# Patient Record
Sex: Male | Born: 1935
Health system: Southern US, Community
[De-identification: ages and names within clinical notes are randomized; demographics above are authoritative.]

## PROBLEM LIST (undated history)

## (undated) DIAGNOSIS — K5900 Constipation, unspecified: Secondary | ICD-10-CM

## (undated) DIAGNOSIS — G4730 Sleep apnea, unspecified: Secondary | ICD-10-CM

## (undated) DIAGNOSIS — R350 Frequency of micturition: Secondary | ICD-10-CM

## (undated) DIAGNOSIS — R42 Dizziness and giddiness: Secondary | ICD-10-CM

## (undated) DIAGNOSIS — H9190 Unspecified hearing loss, unspecified ear: Secondary | ICD-10-CM

## (undated) DIAGNOSIS — M1990 Unspecified osteoarthritis, unspecified site: Secondary | ICD-10-CM

## (undated) DIAGNOSIS — M1610 Unilateral primary osteoarthritis, unspecified hip: Secondary | ICD-10-CM

## (undated) DIAGNOSIS — E669 Obesity, unspecified: Secondary | ICD-10-CM

## (undated) DIAGNOSIS — R609 Edema, unspecified: Secondary | ICD-10-CM

## (undated) DIAGNOSIS — R634 Abnormal weight loss: Secondary | ICD-10-CM

## (undated) DIAGNOSIS — R269 Unspecified abnormalities of gait and mobility: Secondary | ICD-10-CM

## (undated) DIAGNOSIS — M5090 Cervical disc disorder, unspecified, unspecified cervical region: Secondary | ICD-10-CM

## (undated) DIAGNOSIS — D649 Anemia, unspecified: Secondary | ICD-10-CM

## (undated) DIAGNOSIS — Z973 Presence of spectacles and contact lenses: Secondary | ICD-10-CM

## (undated) DIAGNOSIS — T4145XA Adverse effect of unspecified anesthetic, initial encounter: Secondary | ICD-10-CM

## (undated) DIAGNOSIS — S0990XA Unspecified injury of head, initial encounter: Secondary | ICD-10-CM

## (undated) DIAGNOSIS — E7800 Pure hypercholesterolemia, unspecified: Secondary | ICD-10-CM

## (undated) DIAGNOSIS — M48061 Spinal stenosis, lumbar region without neurogenic claudication: Secondary | ICD-10-CM

## (undated) DIAGNOSIS — R413 Other amnesia: Secondary | ICD-10-CM

## (undated) DIAGNOSIS — I8290 Acute embolism and thrombosis of unspecified vein: Secondary | ICD-10-CM

## (undated) DIAGNOSIS — Z7901 Long term (current) use of anticoagulants: Secondary | ICD-10-CM

## (undated) DIAGNOSIS — I10 Essential (primary) hypertension: Secondary | ICD-10-CM

## (undated) DIAGNOSIS — Z9181 History of falling: Secondary | ICD-10-CM

## (undated) DIAGNOSIS — T8859XA Other complications of anesthesia, initial encounter: Secondary | ICD-10-CM

## (undated) DIAGNOSIS — R300 Dysuria: Secondary | ICD-10-CM

## (undated) DIAGNOSIS — G629 Polyneuropathy, unspecified: Secondary | ICD-10-CM

## (undated) DIAGNOSIS — F419 Anxiety disorder, unspecified: Secondary | ICD-10-CM

## (undated) DIAGNOSIS — R936 Abnormal findings on diagnostic imaging of limbs: Secondary | ICD-10-CM

## (undated) DIAGNOSIS — M1611 Unilateral primary osteoarthritis, right hip: Secondary | ICD-10-CM

## (undated) HISTORY — DX: Unspecified injury of head, initial encounter: S09.90XA

## (undated) HISTORY — DX: Spinal stenosis, lumbar region without neurogenic claudication: M48.061

## (undated) HISTORY — DX: Acute embolism and thrombosis of unspecified vein: I82.90

## (undated) HISTORY — DX: Abnormal weight loss: R63.4

## (undated) HISTORY — PX: COLONOSCOPY: SHX174

## (undated) HISTORY — PX: BACK SURGERY: SHX140

## (undated) HISTORY — DX: Polyneuropathy, unspecified: G62.9

## (undated) HISTORY — DX: Obesity, unspecified: E66.9

## (undated) HISTORY — DX: Other amnesia: R41.3

## (undated) HISTORY — DX: Dysuria: R30.0

## (undated) HISTORY — DX: History of falling: Z91.81

## (undated) HISTORY — DX: Anemia, unspecified: D64.9

## (undated) HISTORY — DX: Unilateral primary osteoarthritis, right hip: M16.11

## (undated) HISTORY — DX: Unspecified abnormalities of gait and mobility: R26.9

## (undated) HISTORY — DX: Constipation, unspecified: K59.00

## (undated) HISTORY — DX: Long term (current) use of anticoagulants: Z79.01

## (undated) HISTORY — DX: Cervical disc disorder, unspecified, unspecified cervical region: M50.90

## (undated) HISTORY — DX: Abnormal findings on diagnostic imaging of limbs: R93.6

## (undated) HISTORY — DX: Edema, unspecified: R60.9

## (undated) HISTORY — PX: ESOPHAGOGASTRODUODENOSCOPY: SHX1529

## (undated) HISTORY — DX: Unilateral primary osteoarthritis, unspecified hip: M16.10

---

## 2002-06-20 ENCOUNTER — Emergency Department (HOSPITAL_COMMUNITY): Admission: EM | Admit: 2002-06-20 | Discharge: 2002-06-20 | Payer: Self-pay | Admitting: Emergency Medicine

## 2002-06-20 ENCOUNTER — Encounter: Payer: Self-pay | Admitting: Emergency Medicine

## 2004-06-18 ENCOUNTER — Encounter: Admission: RE | Admit: 2004-06-18 | Discharge: 2004-07-23 | Payer: Self-pay | Admitting: Family Medicine

## 2011-08-14 ENCOUNTER — Other Ambulatory Visit (HOSPITAL_COMMUNITY): Payer: Self-pay | Admitting: Orthopedic Surgery

## 2011-08-14 DIAGNOSIS — R531 Weakness: Secondary | ICD-10-CM

## 2011-08-14 DIAGNOSIS — M25562 Pain in left knee: Secondary | ICD-10-CM

## 2011-08-15 ENCOUNTER — Inpatient Hospital Stay (HOSPITAL_COMMUNITY)
Admission: RE | Admit: 2011-08-15 | Discharge: 2011-08-15 | Payer: Self-pay | Source: Ambulatory Visit | Attending: Orthopedic Surgery | Admitting: Orthopedic Surgery

## 2011-08-17 ENCOUNTER — Ambulatory Visit (HOSPITAL_COMMUNITY)
Admission: RE | Admit: 2011-08-17 | Discharge: 2011-08-17 | Disposition: A | Discharge status: Home / Self Care | Payer: Medicare Other | Source: Ambulatory Visit | Attending: Orthopedic Surgery | Admitting: Orthopedic Surgery

## 2011-08-17 DIAGNOSIS — M6740 Ganglion, unspecified site: Secondary | ICD-10-CM | POA: Insufficient documentation

## 2011-08-17 DIAGNOSIS — M25562 Pain in left knee: Secondary | ICD-10-CM

## 2011-08-17 DIAGNOSIS — M25569 Pain in unspecified knee: Secondary | ICD-10-CM | POA: Insufficient documentation

## 2011-08-17 DIAGNOSIS — X58XXXA Exposure to other specified factors, initial encounter: Secondary | ICD-10-CM | POA: Insufficient documentation

## 2011-08-17 DIAGNOSIS — S82209A Unspecified fracture of shaft of unspecified tibia, initial encounter for closed fracture: Secondary | ICD-10-CM | POA: Insufficient documentation

## 2011-08-17 DIAGNOSIS — R531 Weakness: Secondary | ICD-10-CM

## 2011-08-17 DIAGNOSIS — S83289A Other tear of lateral meniscus, current injury, unspecified knee, initial encounter: Secondary | ICD-10-CM | POA: Insufficient documentation

## 2012-08-06 ENCOUNTER — Emergency Department (HOSPITAL_COMMUNITY): Payer: Medicare Other

## 2012-08-06 ENCOUNTER — Emergency Department (HOSPITAL_COMMUNITY)
Admission: EM | Admit: 2012-08-06 | Discharge: 2012-08-06 | Disposition: A | Discharge status: Home / Self Care | Payer: Medicare Other | Attending: Emergency Medicine | Admitting: Emergency Medicine

## 2012-08-06 ENCOUNTER — Encounter (HOSPITAL_COMMUNITY): Payer: Self-pay | Admitting: Emergency Medicine

## 2012-08-06 DIAGNOSIS — M79609 Pain in unspecified limb: Secondary | ICD-10-CM | POA: Insufficient documentation

## 2012-08-06 DIAGNOSIS — I10 Essential (primary) hypertension: Secondary | ICD-10-CM | POA: Insufficient documentation

## 2012-08-06 DIAGNOSIS — Z8739 Personal history of other diseases of the musculoskeletal system and connective tissue: Secondary | ICD-10-CM | POA: Insufficient documentation

## 2012-08-06 DIAGNOSIS — M25569 Pain in unspecified knee: Secondary | ICD-10-CM

## 2012-08-06 DIAGNOSIS — E7800 Pure hypercholesterolemia, unspecified: Secondary | ICD-10-CM | POA: Insufficient documentation

## 2012-08-06 HISTORY — DX: Unspecified osteoarthritis, unspecified site: M19.90

## 2012-08-06 HISTORY — DX: Pure hypercholesterolemia, unspecified: E78.00

## 2012-08-06 HISTORY — DX: Essential (primary) hypertension: I10

## 2012-08-06 MED ORDER — HYDROMORPHONE HCL PF 1 MG/ML IJ SOLN
1.0000 mg | Freq: Once | INTRAMUSCULAR | Status: AC
  Administered 2012-08-06: 1 mg via INTRAMUSCULAR
  Filled 2012-08-06: qty 1

## 2012-08-06 MED ORDER — OXYCODONE-ACETAMINOPHEN 5-325 MG PO TABS: 1.0000 | ORAL_TABLET | Freq: Once | ORAL | Status: DC

## 2012-08-06 MED ORDER — PROMETHAZINE HCL 25 MG/ML IJ SOLN
12.5000 mg | Freq: Once | INTRAMUSCULAR | Status: AC
  Administered 2012-08-06: 12.5 mg via INTRAMUSCULAR
  Filled 2012-08-06: qty 1

## 2012-08-06 MED ORDER — ONDANSETRON 4 MG PO TBDP
8.0000 mg | ORAL_TABLET | Freq: Once | ORAL | Status: AC
  Administered 2012-08-06: 8 mg via ORAL

## 2012-08-06 MED ORDER — SODIUM CHLORIDE 0.9 % IV BOLUS (SEPSIS)
500.0000 mL | Freq: Once | INTRAVENOUS | Status: AC
  Administered 2012-08-06: 500 mL via INTRAVENOUS

## 2012-08-06 MED ORDER — PROMETHAZINE HCL 25 MG PO TABS: 25.0000 mg | ORAL_TABLET | Freq: Four times a day (QID) | ORAL | Status: DC | PRN

## 2012-08-06 MED ORDER — ONDANSETRON 4 MG PO TBDP
ORAL_TABLET | ORAL | Status: AC
  Administered 2012-08-06: 8 mg via ORAL
  Filled 2012-08-06: qty 2

## 2012-08-06 MED ORDER — HYDROCODONE-ACETAMINOPHEN 5-325 MG PO TABS: 1.0000 | ORAL_TABLET | Freq: Four times a day (QID) | ORAL | Status: DC | PRN

## 2012-08-06 MED ORDER — MECLIZINE HCL 25 MG PO TABS: ORAL_TABLET | ORAL | Status: DC

## 2012-08-06 MED ORDER — MECLIZINE HCL 25 MG PO TABS
25.0000 mg | ORAL_TABLET | Freq: Once | ORAL | Status: AC
  Administered 2012-08-06: 25 mg via ORAL
  Filled 2012-08-06: qty 1

## 2012-08-06 MED ORDER — ONDANSETRON 4 MG PO TBDP
8.0000 mg | ORAL_TABLET | Freq: Once | ORAL | Status: AC
  Administered 2012-08-06: 8 mg via ORAL
  Filled 2012-08-06: qty 2

## 2012-08-06 NOTE — ED Notes (Signed)
Pt vomited X 1 in radiology. RN went to CT to check on pt, pt reports he gets motion sickness and when they moved him over to the table he started to feel more nauseous then vomited. Dr. Estell Harpin made aware.

## 2012-08-06 NOTE — ED Provider Notes (Signed)
History   This chart was scribed for Travis Lennert, MD by Sofie Rower, ED Scribe. The patient was seen in room TR08C/TR08C and the patient's care was started at 3:26PM.    CSN: [id number]  Arrival date & time 08/06/12  1454   First MD Initiated Contact with Patient 08/06/12 1526      Chief Complaint  Patient presents with  . Leg Pain  . Knee Pain    (Consider location/radiation/quality/duration/timing/severity/associated sxs/prior treatment) Patient is a 78 y.o. male presenting with leg pain and knee pain. The history is provided by the patient. No language interpreter was used.  Leg Pain  The incident occurred 2 days ago. There was no injury mechanism. The pain is present in the right leg, right knee and right hip. The pain is moderate. The pain has been constant since onset. He reports no foreign bodies present. The symptoms are aggravated by activity. He has tried nothing for the symptoms. The treatment provided no relief.  Knee Pain This is a new problem. The current episode started 2 days ago. The problem occurs constantly. The problem has been gradually worsening. Pertinent negatives include no chest pain, no abdominal pain and no headaches. The symptoms are aggravated by walking and bending. Nothing relieves the symptoms. He has tried nothing for the symptoms. The treatment provided no relief.    Past Medical History  Diagnosis Date  . Arthritis   . Hypertension   . Hypercholesteremia     History reviewed. No pertinent past surgical history.  History reviewed. No pertinent family history.  History  Substance Use Topics  . Smoking status: Never Smoker   . Smokeless tobacco: Not on file  . Alcohol Use: Yes     Comment: occ      Review of Systems  Constitutional: Negative for fatigue.  HENT: Negative for congestion, sinus pressure and ear discharge.   Eyes: Negative for discharge.  Respiratory: Negative for cough.   Cardiovascular: Negative for chest pain.    Gastrointestinal: Negative for abdominal pain and diarrhea.  Genitourinary: Negative for frequency and hematuria.  Musculoskeletal: Negative for back pain.  Skin: Negative for rash.  Neurological: Negative for seizures and headaches.  Hematological: Negative.   Psychiatric/Behavioral: Negative for hallucinations.    Allergies  Review of patient's allergies indicates no known allergies.  Home Medications  No current outpatient prescriptions on file.  BP 125/81  Pulse 66  Temp 97.6 F (36.4 C) (Oral)  Resp 18  SpO2 100%  Physical Exam  Nursing note and vitals reviewed. Constitutional: He is oriented to person, place, and time. He appears well-developed.  HENT:  Head: Normocephalic.  Eyes: Conjunctivae normal are normal.  Neck: No tracheal deviation present.  Cardiovascular:  No murmur heard. Musculoskeletal: Normal range of motion. He exhibits tenderness.       Right hip: He exhibits tenderness.       Right knee: tenderness found.       Positive straight leg tenderness in the right knee, worse with flexion. Tenderness within the right leg. Tenderness within the right hip.    Neurological: He is oriented to person, place, and time.  Skin: Skin is warm.  Psychiatric: He has a normal mood and affect.    ED Course  Procedures (including critical care time)  DIAGNOSTIC STUDIES: Oxygen Saturation is 100% on room air, normal by my interpretation.    COORDINATION OF CARE:   3:30 PM- Treatment plan concerning pain management and x-rays discussed with patient. Pt agrees with  treatment.   5:21 PM- Recheck. Treatment plan discussed with patient. Pt agrees with treatment.      Labs Reviewed - No data to display  No results found for this or any previous visit. Dg Hip Complete Right  08/06/2012  *RADIOLOGY REPORT*  Clinical Data: Right hip pain.  RIGHT HIP - COMPLETE 2+ VIEW  Comparison: None  Findings: Mild degenerative changes present.  No evidence of acute fracture  or dislocation.  The soft tissues are unremarkable.  The bony pelvis is intact.  IMPRESSION: No acute fracture.  Mild degenerative changes of the right hip.   Original Report Authenticated By: Irish Lack, M.D.    Dg Knee Complete 4 Views Right  08/06/2012  *RADIOLOGY REPORT*  Clinical Data: Right knee pain.  Difficulty walking.  No known injuries.  RIGHT KNEE - COMPLETE 4+ VIEW  Comparison: None.  Findings: No evidence of acute or subacute fracture or dislocation. Well-preserved joint spaces.  Hypertrophic spurring involving the patellofemoral and lateral compartments.  Enthesopathic spurring at the insertion of the quadriceps tendon on the superior patella and the insertion of the patellar tendon on both the inferior patella and the tibial tubercle.  No evidence of a significant joint effusion.  Well-preserved bone mineral density.  IMPRESSION: No acute or subacute osseous abnormality.  Degenerative changes as described.   Original Report Authenticated By: Hulan Saas, M.D.       No diagnosis found.    MDM      The chart was scribed for me under my direct supervision.  I personally performed the history, physical, and medical decision making and all procedures in the evaluation of this patient.Travis Lennert, MD 08/06/12 1723

## 2012-08-06 NOTE — ED Notes (Signed)
Pt. Verbalized understanding of meclizine.

## 2012-08-06 NOTE — ED Notes (Signed)
Pt being taken to d/c in wheelchair when he became nauseated and vomited X 1. Pt brought back to room. MD notified.

## 2012-08-06 NOTE — ED Notes (Signed)
Pt. States "I feel like I can leave".

## 2012-08-06 NOTE — ED Notes (Signed)
Pt c/o right leg pain.Pt unable to give specific spot it starts or hurts worse. Pt reports it started 3 days.

## 2012-08-06 NOTE — ED Notes (Signed)
Pt c/o right knee and hip pain x 2 days; pt denies fall or injury

## 2012-08-06 NOTE — ED Notes (Signed)
Pt in CT, CT tech called reports pt is nauseous. MD aware, ordered zofran PO.

## 2012-08-06 NOTE — ED Notes (Signed)
Pt reports he feels "swimmy headed"

## 2013-06-20 ENCOUNTER — Ambulatory Visit: Payer: BC Managed Care – PPO | Admitting: Family Medicine

## 2013-06-28 ENCOUNTER — Ambulatory Visit (INDEPENDENT_AMBULATORY_CARE_PROVIDER_SITE_OTHER): Payer: Self-pay | Admitting: Family Medicine

## 2013-06-28 DIAGNOSIS — Z713 Dietary counseling and surveillance: Secondary | ICD-10-CM

## 2013-10-31 ENCOUNTER — Emergency Department (HOSPITAL_COMMUNITY)
Admission: EM | Admit: 2013-10-31 | Discharge: 2013-10-31 | Disposition: A | Discharge status: Home / Self Care | Payer: PRIVATE HEALTH INSURANCE | Attending: Emergency Medicine | Admitting: Emergency Medicine

## 2013-10-31 ENCOUNTER — Emergency Department (HOSPITAL_COMMUNITY): Payer: PRIVATE HEALTH INSURANCE

## 2013-10-31 ENCOUNTER — Encounter (HOSPITAL_COMMUNITY): Payer: Self-pay | Admitting: Emergency Medicine

## 2013-10-31 DIAGNOSIS — Z791 Long term (current) use of non-steroidal anti-inflammatories (NSAID): Secondary | ICD-10-CM | POA: Insufficient documentation

## 2013-10-31 DIAGNOSIS — R1110 Vomiting, unspecified: Secondary | ICD-10-CM

## 2013-10-31 DIAGNOSIS — M129 Arthropathy, unspecified: Secondary | ICD-10-CM | POA: Insufficient documentation

## 2013-10-31 DIAGNOSIS — E7800 Pure hypercholesterolemia, unspecified: Secondary | ICD-10-CM | POA: Insufficient documentation

## 2013-10-31 DIAGNOSIS — R112 Nausea with vomiting, unspecified: Secondary | ICD-10-CM | POA: Insufficient documentation

## 2013-10-31 DIAGNOSIS — I10 Essential (primary) hypertension: Secondary | ICD-10-CM | POA: Insufficient documentation

## 2013-10-31 DIAGNOSIS — Z7982 Long term (current) use of aspirin: Secondary | ICD-10-CM | POA: Insufficient documentation

## 2013-10-31 DIAGNOSIS — Z79899 Other long term (current) drug therapy: Secondary | ICD-10-CM | POA: Insufficient documentation

## 2013-10-31 DIAGNOSIS — R197 Diarrhea, unspecified: Secondary | ICD-10-CM | POA: Insufficient documentation

## 2013-10-31 DIAGNOSIS — IMO0002 Reserved for concepts with insufficient information to code with codable children: Secondary | ICD-10-CM | POA: Insufficient documentation

## 2013-10-31 DIAGNOSIS — R5381 Other malaise: Secondary | ICD-10-CM | POA: Insufficient documentation

## 2013-10-31 DIAGNOSIS — R5383 Other fatigue: Secondary | ICD-10-CM

## 2013-10-31 HISTORY — DX: Dizziness and giddiness: R42

## 2013-10-31 LAB — CBC WITH DIFFERENTIAL/PLATELET
Basophils Absolute: 0 10*3/uL (ref 0.0–0.1)
Basophils Relative: 0 % (ref 0–1)
EOS PCT: 0 % (ref 0–5)
Eosinophils Absolute: 0 10*3/uL (ref 0.0–0.7)
HEMATOCRIT: 42 % (ref 39.0–52.0)
Hemoglobin: 14.4 g/dL (ref 13.0–17.0)
LYMPHS ABS: 0.3 10*3/uL — AB (ref 0.7–4.0)
LYMPHS PCT: 4 % — AB (ref 12–46)
MCH: 30.2 pg (ref 26.0–34.0)
MCHC: 34.3 g/dL (ref 30.0–36.0)
MCV: 88.1 fL (ref 78.0–100.0)
MONO ABS: 0.4 10*3/uL (ref 0.1–1.0)
Monocytes Relative: 5 % (ref 3–12)
NEUTROS ABS: 7 10*3/uL (ref 1.7–7.7)
Neutrophils Relative %: 91 % — ABNORMAL HIGH (ref 43–77)
Platelets: 173 10*3/uL (ref 150–400)
RBC: 4.77 MIL/uL (ref 4.22–5.81)
RDW: 13.8 % (ref 11.5–15.5)
WBC: 7.8 10*3/uL (ref 4.0–10.5)

## 2013-10-31 LAB — COMPREHENSIVE METABOLIC PANEL
ALT: 13 U/L (ref 0–53)
AST: 14 U/L (ref 0–37)
Albumin: 3.5 g/dL (ref 3.5–5.2)
Alkaline Phosphatase: 72 U/L (ref 39–117)
BILIRUBIN TOTAL: 0.8 mg/dL (ref 0.3–1.2)
BUN: 12 mg/dL (ref 6–23)
CALCIUM: 8.8 mg/dL (ref 8.4–10.5)
CHLORIDE: 99 meq/L (ref 96–112)
CO2: 24 meq/L (ref 19–32)
Creatinine, Ser: 0.67 mg/dL (ref 0.50–1.35)
GFR, EST NON AFRICAN AMERICAN: 88 mL/min — AB (ref >90)
GLUCOSE: 140 mg/dL — AB (ref 70–99)
Potassium: 3.8 mEq/L (ref 3.7–5.3)
SODIUM: 138 meq/L (ref 137–147)
Total Protein: 7.1 g/dL (ref 6.0–8.3)

## 2013-10-31 LAB — URINALYSIS, ROUTINE W REFLEX MICROSCOPIC
BILIRUBIN URINE: NEGATIVE
GLUCOSE, UA: NEGATIVE mg/dL
HGB URINE DIPSTICK: NEGATIVE
KETONES UR: NEGATIVE mg/dL
LEUKOCYTES UA: NEGATIVE
Nitrite: NEGATIVE
PH: 6 (ref 5.0–8.0)
PROTEIN: NEGATIVE mg/dL
Specific Gravity, Urine: 1.017 (ref 1.005–1.030)
Urobilinogen, UA: 1 mg/dL (ref 0.0–1.0)

## 2013-10-31 LAB — CBG MONITORING, ED: Glucose-Capillary: 146 mg/dL — ABNORMAL HIGH (ref 70–99)

## 2013-10-31 LAB — LACTIC ACID, PLASMA: Lactic Acid, Venous: 1.2 mmol/L (ref 0.5–2.2)

## 2013-10-31 LAB — TROPONIN I

## 2013-10-31 MED ORDER — SODIUM CHLORIDE 0.9 % IV BOLUS (SEPSIS)
1000.0000 mL | Freq: Once | INTRAVENOUS | Status: AC
  Administered 2013-10-31: 1000 mL via INTRAVENOUS

## 2013-10-31 MED ORDER — FENTANYL CITRATE 0.05 MG/ML IJ SOLN
25.0000 ug | Freq: Once | INTRAMUSCULAR | Status: AC
  Administered 2013-10-31: 25 ug via INTRAVENOUS
  Filled 2013-10-31: qty 2

## 2013-10-31 MED ORDER — PROMETHAZINE HCL 25 MG/ML IJ SOLN
12.5000 mg | Freq: Once | INTRAMUSCULAR | Status: AC
  Administered 2013-10-31: 12.5 mg via INTRAVENOUS
  Filled 2013-10-31: qty 1

## 2013-10-31 MED ORDER — SODIUM CHLORIDE 0.9 % IV BOLUS (SEPSIS)
500.0000 mL | Freq: Once | INTRAVENOUS | Status: AC
  Administered 2013-10-31: 500 mL via INTRAVENOUS

## 2013-10-31 MED ORDER — ONDANSETRON 4 MG PO TBDP: 4.0000 mg | ORAL_TABLET | Freq: Three times a day (TID) | ORAL | Status: DC | PRN

## 2013-10-31 NOTE — Discharge Instructions (Signed)
Please follow up with your primary care physician in 1-2 days. If you do not have one please call the [facility] number listed above. Please take Zofran as prescribed for nausea and vomiting. Please read all discharge instructions and return precautions.    Nausea and Vomiting Nausea is a sick feeling that often comes before throwing up (vomiting). Vomiting is a reflex where stomach contents come out of your mouth. Vomiting can cause severe loss of body fluids (dehydration). Children and elderly adults can become dehydrated quickly, especially if they also have diarrhea. Nausea and vomiting are symptoms of a condition or disease. It is important to find the cause of your symptoms. CAUSES   Direct irritation of the stomach lining. This irritation can result from increased acid production (gastroesophageal reflux disease), infection, food poisoning, taking certain medicines (such as nonsteroidal anti-inflammatory drugs), alcohol use, or tobacco use.  Signals from the brain.These signals could be caused by a headache, heat exposure, an inner ear disturbance, increased pressure in the brain from injury, infection, a tumor, or a concussion, pain, emotional stimulus, or metabolic problems.  An obstruction in the gastrointestinal tract (bowel obstruction).  Illnesses such as diabetes, hepatitis, gallbladder problems, appendicitis, kidney problems, cancer, sepsis, atypical symptoms of a heart attack, or eating disorders.  Medical treatments such as chemotherapy and radiation.  Receiving medicine that makes you sleep (general anesthetic) during surgery. DIAGNOSIS Your caregiver may ask for tests to be done if the problems do not improve after a few days. Tests may also be done if symptoms are severe or if the reason for the nausea and vomiting is not clear. Tests may include:  Urine tests.  Blood tests.  Stool tests.  Cultures (to look for evidence of infection).  X-rays or  other imaging studies. Test results can help your caregiver make decisions about treatment or the need for additional tests. TREATMENT You need to stay well hydrated. Drink frequently but in small amounts.You may wish to drink water, sports drinks, clear broth, or eat frozen ice pops or gelatin dessert to help stay hydrated.When you eat, eating slowly may help prevent nausea.There are also some antinausea medicines that may help prevent nausea. HOME CARE INSTRUCTIONS   Take all medicine as directed by your caregiver.  If you do not have an appetite, do not force yourself to eat. However, you must continue to drink fluids.  If you have an appetite, eat a normal diet unless your caregiver tells you differently.  Eat a variety of complex carbohydrates (rice, wheat, potatoes, bread), lean meats, yogurt, fruits, and vegetables.  Avoid high-fat foods because they are more difficult to digest.  Drink enough water and fluids to keep your urine clear or pale yellow.  If you are dehydrated, ask your caregiver for specific rehydration instructions. Signs of dehydration may include:  Severe thirst.  Dry lips and mouth.  Dizziness.  Dark urine.  Decreasing urine frequency and amount.  Confusion.  Rapid breathing or pulse. SEEK IMMEDIATE MEDICAL CARE IF:   You have blood or brown flecks (like coffee grounds) in your vomit.  You have black or bloody stools.  You have a severe headache or stiff neck.  You are confused.  You have severe abdominal pain.  You have chest pain or trouble breathing.  You do not urinate at least once every 8 hours.  You develop cold or clammy skin.  You continue to vomit for longer than 24 to 48 hours.  You have a fever.  MAKE SURE YOU:   Understand these instructions.  Will watch your condition.  Will get help right away if you are not doing well or get worse. Document Released: 08/25/2005 Document Revised: 11/17/2011 Document Reviewed:  01/22/2011 [facility] Patient Information 2014 [place], [place].

## 2013-10-31 NOTE — ED Notes (Addendum)
Per EMS- Patient is a resident of [facility]. Patient went to see PCP yesterday and was treated for a sinus infection. Today, the patient c/o vomiting that is worse with movement. Patient denies any diarrhea. Patient was given Zofran 4 mg IV prior to arrival with relief.

## 2013-10-31 NOTE — Progress Notes (Signed)
   CARE MANAGEMENT ED NOTE 10/31/2013  Patient:  [name], [name]   Account Number:  [id number]  Date Initiated:  10/31/2013  Documentation initiated by:  Jackelyn Poling  Subjective/Objective Assessment:   80 yr old [employer] pt with pcp listed as Dr [name] from [facility] [employer] other [facility], vincent [facility]     Subjective/Objective Assessment Detail:     Action/Plan:   updated epic   Action/Plan Detail:   Anticipated DC Date:  10/31/2013     Status Recommendation to Physician:   Result of Recommendation:    Other ED [place]  Other  PCP issues  Outpatient Services - Pt will follow up    Choice offered to / List presented to:            Status of service:  Completed, signed off  ED Comments:   ED Comments Detail:

## 2013-10-31 NOTE — ED Provider Notes (Signed)
CSN: [id number]     Arrival date & time 10/31/13  1052 History   First MD Initiated Contact with Patient 10/31/13 1054     Chief Complaint  Patient presents with  . Emesis     (Consider location/radiation/quality/duration/timing/severity/associated sxs/prior Treatment) HPI Comments:  Patient is an 80 year old male past medical history significant for arthritis, hypertension, hypercholesterolemia, vertigo back pain presenting to the ED for 4 days of generalized weakness one day of nonbloody nonbilious emesis and nonbloody diarrhea that began last evening. Patient states he also had a sinus infection that was diagnosed yesterday and began his Augmentin this morning. Patient received a cortisone shot into his lumbar spine performed at Dr. Randel Pigg office last week. Patient is denying any CP, SOB, fevers, chills, abdominal pain, urinary symptoms. Patient has no abdominal surgical history. Patient is a DNR.   Past Medical History  Diagnosis Date  . Arthritis   . Hypertension   . Hypercholesteremia   . Vertigo    Past Surgical History  Procedure Laterality Date  . Back surgery     No family history on file. History  Substance Use Topics  . Smoking status: Never Smoker   . Smokeless tobacco: Not on file  . Alcohol Use: Yes     Comment: occ    Review of Systems  Constitutional: Negative for fever and chills.  Respiratory: Negative for shortness of breath.   Cardiovascular: Negative for chest pain.  Gastrointestinal: Positive for nausea, vomiting and diarrhea. Negative for abdominal pain, blood in stool and anal bleeding.       No bowel incontinence  Genitourinary:       No bladder incontinence   All other systems reviewed and are negative.      Allergies  Review of patient's allergies indicates no known allergies.  Home Medications   Current Outpatient Rx  Name  Route  Sig  Dispense  Refill  . aspirin 81 MG tablet   Oral   Take 81 mg by mouth daily.         Marland Kitchen  atorvastatin (LIPITOR) 40 MG tablet   Oral   Take 40 mg by mouth daily.         Marland Kitchen co-enzyme Q-10 50 MG capsule   Oral   Take 100 mg by mouth daily.          . fluticasone (FLONASE) 50 MCG/ACT nasal spray   Each Nare   Place 2 sprays into both nostrils daily.         Marland Kitchen HYDROcodone-acetaminophen (NORCO/VICODIN) 5-325 MG per tablet   Oral   Take 1 tablet by mouth every 6 (six) hours as needed for pain.   30 tablet   0   . lisinopril (PRINIVIL,ZESTRIL) 10 MG tablet   Oral   Take 10 mg by mouth daily.         . meclizine (ANTIVERT) 25 MG tablet   Oral   Take 25 mg by mouth 3 (three) times daily.         . meloxicam (MOBIC) 15 MG tablet   Oral   Take 15 mg by mouth daily.         . Nutritional Supplements (ARTHRO-COMPLEX PO)   Oral   Take 1,000 mg by mouth daily.         Marland Kitchen PRESCRIPTION MEDICATION      Cortizone shot in lumber spine 10/27/13         . sodium chloride (OCEAN) 0.65 % SOLN nasal spray  Each Nare   Place 2 sprays into both nostrils as needed for congestion.         . ondansetron (ZOFRAN ODT) 4 MG disintegrating tablet   Oral   Take 1 tablet (4 mg total) by mouth every 8 (eight) hours as needed for nausea or vomiting.   10 tablet   0    BP 145/72  Pulse 69  Temp(Src) 97.6 F (36.4 C) (Oral)  Resp 16  SpO2 98% Physical Exam  Constitutional: He is oriented to person, place, and time. He appears well-developed and well-nourished. No distress.  HENT:  Head: Normocephalic and atraumatic.  Right Ear: External ear normal.  Left Ear: External ear normal.  Nose: Nose normal.  Mouth/Throat: Oropharynx is clear and moist. No oropharyngeal exudate.  Eyes: Conjunctivae and EOM are normal. Pupils are equal, round, and reactive to light.  Neck: Normal range of motion. Neck supple.  Cardiovascular: Normal rate, regular rhythm, normal heart sounds and intact distal pulses.   Pulmonary/Chest: Effort normal and breath sounds normal. No  respiratory distress.  Abdominal: Soft. There is no tenderness.  Musculoskeletal: He exhibits no edema.  No injection site appreciated on examination of back. No erythema, warmth noted.   Neurological: He is alert and oriented to person, place, and time. He has normal strength. No cranial nerve deficit or sensory deficit. Gait normal. GCS eye subscore is 4. GCS verbal subscore is 5. GCS motor subscore is 6.  No pronator drift. Bilateral heel-knee-shin intact.  Skin: Skin is warm and dry. He is not diaphoretic.    ED Course  Procedures (including critical care time) Medications  sodium chloride 0.9 % bolus 1,000 mL (0 mLs Intravenous Stopped 10/31/13 1322)  promethazine (PHENERGAN) injection 12.5 mg (12.5 mg Intravenous Given 10/31/13 1156)  sodium chloride 0.9 % bolus 500 mL (500 mLs Intravenous New Bag/Given 10/31/13 1327)  fentaNYL (SUBLIMAZE) injection 25 mcg (25 mcg Intravenous Given 10/31/13 1328)    Labs Review Labs Reviewed  CBC WITH DIFFERENTIAL - Abnormal; Notable for the following:    Neutrophils Relative % 91 (*)    Lymphocytes Relative 4 (*)    Lymphs Abs 0.3 (*)    All other components within normal limits  COMPREHENSIVE METABOLIC PANEL - Abnormal; Notable for the following:    Glucose, Bld 140 (*)    GFR calc non Af Amer 88 (*)    All other components within normal limits  CBG MONITORING, ED - Abnormal; Notable for the following:    Glucose-Capillary 146 (*)    All other components within normal limits  URINE CULTURE  LACTIC ACID, PLASMA  URINALYSIS, ROUTINE W REFLEX MICROSCOPIC  TROPONIN I   Imaging Review Dg Chest Portable 1 View  10/31/2013   CLINICAL DATA:  Vertigo  EXAM: PORTABLE CHEST - 1 VIEW  COMPARISON:  None.  FINDINGS: 07/1936 hrs. Lung volumes are low. Vascular congestion noted without overt airspace pulmonary edema. Interstitial markings are diffusely coarsened with chronic features. No focal airspace consolidation or evidence of pleural effusion. The  cardiopericardial silhouette is within normal limits for size. Imaged bony structures of the thorax are intact.  IMPRESSION: Low volumes with vascular congestion   Electronically Signed   By: Misty Stanley M.D.   On: 10/31/2013 11:50    EKG Interpretation   None       MDM   Final diagnoses:  Emesis    Filed Vitals:   10/31/13 1341  BP: 145/72  Pulse: 69  Temp: 97.6 F (36.4  C)  Resp: 16   Afebrile, NAD, non-toxic appearing, AAOx4. Patient initially hypoxic on RA, improved with 2L of O2. Vitals otherwise stable. Abdomen soft, non-tender, nondistended. Good bowel sounds. No focal deficits on examination. No evidence of infected injection site. I have reviewed nursing notes, vital signs, and all appropriate lab and imaging results for this patient. Lactic acid within normal limits. No leukocytosis. CMP unremarkable. Chest x-ray. 2 IV bolus given. IV Zofran and Phenergan given. Finally given for chronic back pain. Patient's symptoms improved after IV fluids and IV antiemetics. Do not feel that the patient will require abdominal scanning at this time. Able to sit patient up and take him off of oxygen and he is maintaining saturations 96% and above on room air without any difficulty breathing. Patient is expressing interest in going home I agree that he is able to be discharged. Good return precautions discussed. Patient and family the plan. Patient is stable at time of discharge. Patient d/w with Dr. Jeneen Rinks, agrees with plan.       Harlow Mares, PA-C 10/31/13 1529

## 2013-11-01 LAB — URINE CULTURE
CULTURE: NO GROWTH
Colony Count: NO GROWTH

## 2013-11-06 NOTE — ED Provider Notes (Signed)
Medical screening examination/treatment/procedure(s) were performed by non-physician practitioner and as supervising physician I was immediately available for consultation/collaboration.   EKG Interpretation None        Tanna Furry, MD 11/06/13 [id number]

## 2014-01-04 DIAGNOSIS — H2513 Age-related nuclear cataract, bilateral: Secondary | ICD-10-CM | POA: Insufficient documentation

## 2014-10-31 ENCOUNTER — Other Ambulatory Visit: Payer: Self-pay | Admitting: Orthopedic Surgery

## 2014-10-31 DIAGNOSIS — M545 Low back pain: Secondary | ICD-10-CM

## 2014-11-16 ENCOUNTER — Ambulatory Visit
Admission: RE | Admit: 2014-11-16 | Discharge: 2014-11-16 | Disposition: A | Discharge status: Home / Self Care | Payer: Medicare Other | Source: Ambulatory Visit | Attending: Orthopedic Surgery | Admitting: Orthopedic Surgery

## 2014-11-16 DIAGNOSIS — M545 Low back pain: Secondary | ICD-10-CM

## 2016-04-08 DIAGNOSIS — R634 Abnormal weight loss: Secondary | ICD-10-CM | POA: Insufficient documentation

## 2016-04-08 HISTORY — DX: Abnormal weight loss: R63.4

## 2016-05-15 ENCOUNTER — Other Ambulatory Visit: Payer: Self-pay | Admitting: Neurosurgery

## 2016-05-19 ENCOUNTER — Other Ambulatory Visit: Payer: Self-pay | Admitting: Neurosurgery

## 2016-05-20 ENCOUNTER — Other Ambulatory Visit: Payer: Self-pay | Admitting: Neurosurgery

## 2016-05-21 ENCOUNTER — Encounter (HOSPITAL_COMMUNITY): Payer: Self-pay

## 2016-05-21 ENCOUNTER — Encounter (HOSPITAL_COMMUNITY)
Admission: RE | Admit: 2016-05-21 | Discharge: 2016-05-21 | Disposition: A | Discharge status: Home / Self Care | Payer: Medicare Other | Source: Ambulatory Visit | Attending: Neurosurgery | Admitting: Neurosurgery

## 2016-05-21 DIAGNOSIS — Z01812 Encounter for preprocedural laboratory examination: Secondary | ICD-10-CM | POA: Diagnosis not present

## 2016-05-21 HISTORY — DX: Frequency of micturition: R35.0

## 2016-05-21 HISTORY — DX: Adverse effect of unspecified anesthetic, initial encounter: T41.45XA

## 2016-05-21 HISTORY — DX: Presence of spectacles and contact lenses: Z97.3

## 2016-05-21 HISTORY — DX: Other complications of anesthesia, initial encounter: T88.59XA

## 2016-05-21 HISTORY — DX: Sleep apnea, unspecified: G47.30

## 2016-05-21 HISTORY — DX: Unspecified hearing loss, unspecified ear: H91.90

## 2016-05-21 HISTORY — DX: Anxiety disorder, unspecified: F41.9

## 2016-05-21 LAB — SURGICAL PCR SCREEN
MRSA, PCR: NEGATIVE
Staphylococcus aureus: NEGATIVE

## 2016-05-21 NOTE — Progress Notes (Signed)
PCP - Dr. Kathyrn Lass at [facility] on [address] [place] Cardiologist - denies  EKG - 05/13/16 - requested CXR - denies  Echo/stress test/cardiac cath - denies  Patient denies chest pain and shortness of breath at PAT appointment.

## 2016-05-21 NOTE — Pre-Procedure Instructions (Signed)
[name]  05/21/2016      [address], [place] [address] Phone: [phone] Fax: [place], [place] [address] Phone: [phone] Fax: [phone]    Your procedure is scheduled on Monday, September 18th, 2017.  Report to [facility] Admitting at 7:00 A.M.   Call this number if you have problems the morning of surgery:  [phone]   Remember:  Do not eat food or drink liquids after midnight.   Take these medicines the morning of surgery with A SIP OF WATER: Fluticasone (Flonase), Hydrocodone-acetaminophen (Norco) if needed, Meclizine (Antivert), Ondansetron (Zofran) if needed, Nasal spray if needed  Stop taking: Aspirin, NSAIDS, Meloxicam (Mobic), Advil, Motrin, Ibuprofen, Aleve, Naproxen, BC"s, Goody's, Fish oil, all herbal medications, and all vitamins.    Do not wear jewelry.  Do not wear lotions, powders, or perfumes, or deoderant.  Men may shave face and neck.  Do not bring valuables to the hospital.  [facility] is not responsible for any belongings or valuables.  Contacts, dentures or bridgework may not be worn into surgery.  Leave your suitcase in the car.  After surgery it may be brought to your room.  For patients admitted to the hospital, discharge time will be determined by your treatment team.  Patients discharged the day of surgery will not be allowed to drive home.   Special instructions:  Preparing for Surgery.   Please read over the following fact sheets that you were given. MRSA Information    [place]- Preparing For Surgery  Before surgery, you can play an important role. Because skin is not sterile, your skin needs to be as free of germs as possible. You can reduce the number of germs on your skin by washing with CHG (chlorahexidine gluconate) Soap  before surgery.  CHG is an antiseptic cleaner which kills germs and bonds with the skin to continue killing germs even after washing.  Please do not use if you have an allergy to CHG or antibacterial soaps. If your skin becomes reddened/irritated stop using the CHG.  Do not shave (including legs and underarms) for at least 48 hours prior to first CHG shower. It is OK to shave your face.  Please follow these instructions carefully.   1. Shower the NIGHT BEFORE SURGERY and the MORNING OF SURGERY with CHG.   2. If you chose to wash your hair, wash your hair first as usual with your normal shampoo.  3. After you shampoo, rinse your hair and body thoroughly to remove the shampoo.  4. Use CHG as you would any other liquid soap. You can apply CHG directly to the skin and wash gently with a scrungie or a clean washcloth.   5. Apply the CHG Soap to your body ONLY FROM THE NECK DOWN.  Do not use on open wounds or open sores. Avoid contact with your eyes, ears, mouth and genitals (private parts). Wash genitals (private parts) with your normal soap.  6. Wash thoroughly, paying special attention to the area where your surgery will be performed.  7. Thoroughly rinse your body with warm water from the neck down.  8. DO NOT shower/wash with your normal soap after using and rinsing off the CHG Soap.  9. Pat yourself dry with a CLEAN TOWEL.   10. Wear CLEAN PAJAMAS  11. Place CLEAN SHEETS on your bed the night of your first shower and DO NOT SLEEP WITH PETS.  Day of Surgery: Do not apply any deodorants/lotions. Please wear clean clothes to the hospital/surgery center.

## 2016-05-28 ENCOUNTER — Encounter (HOSPITAL_COMMUNITY): Payer: Self-pay | Admitting: Surgery

## 2016-05-28 ENCOUNTER — Ambulatory Visit (HOSPITAL_COMMUNITY)
Admission: RE | Admit: 2016-05-28 | Discharge: 2016-05-29 | Disposition: A | Discharge status: Home / Self Care | Payer: Medicare Other | Source: Ambulatory Visit | Attending: Neurosurgery | Admitting: Neurosurgery

## 2016-05-28 ENCOUNTER — Ambulatory Visit (HOSPITAL_COMMUNITY): Payer: Medicare Other | Admitting: Certified Registered"

## 2016-05-28 ENCOUNTER — Encounter (HOSPITAL_COMMUNITY)
Admission: RE | Disposition: A | Discharge status: Home / Self Care | Payer: Self-pay | Source: Ambulatory Visit | Attending: Neurosurgery

## 2016-05-28 ENCOUNTER — Ambulatory Visit (HOSPITAL_COMMUNITY): Payer: Medicare Other

## 2016-05-28 DIAGNOSIS — M48061 Spinal stenosis, lumbar region without neurogenic claudication: Secondary | ICD-10-CM

## 2016-05-28 DIAGNOSIS — G4730 Sleep apnea, unspecified: Secondary | ICD-10-CM | POA: Diagnosis not present

## 2016-05-28 DIAGNOSIS — M5416 Radiculopathy, lumbar region: Secondary | ICD-10-CM | POA: Insufficient documentation

## 2016-05-28 DIAGNOSIS — Z87891 Personal history of nicotine dependence: Secondary | ICD-10-CM | POA: Diagnosis not present

## 2016-05-28 DIAGNOSIS — Z419 Encounter for procedure for purposes other than remedying health state, unspecified: Secondary | ICD-10-CM

## 2016-05-28 DIAGNOSIS — F419 Anxiety disorder, unspecified: Secondary | ICD-10-CM | POA: Diagnosis not present

## 2016-05-28 DIAGNOSIS — M1990 Unspecified osteoarthritis, unspecified site: Secondary | ICD-10-CM | POA: Diagnosis not present

## 2016-05-28 DIAGNOSIS — R269 Unspecified abnormalities of gait and mobility: Secondary | ICD-10-CM

## 2016-05-28 DIAGNOSIS — M4806 Spinal stenosis, lumbar region: Secondary | ICD-10-CM | POA: Diagnosis not present

## 2016-05-28 DIAGNOSIS — I10 Essential (primary) hypertension: Secondary | ICD-10-CM | POA: Diagnosis not present

## 2016-05-28 DIAGNOSIS — E7800 Pure hypercholesterolemia, unspecified: Secondary | ICD-10-CM

## 2016-05-28 HISTORY — PX: LUMBAR LAMINECTOMY/DECOMPRESSION MICRODISCECTOMY: SHX5026

## 2016-05-28 HISTORY — DX: Spinal stenosis, lumbar region without neurogenic claudication: M48.061

## 2016-05-28 LAB — CBC
HCT: 36 % — ABNORMAL LOW (ref 39.0–52.0)
Hemoglobin: 11.6 g/dL — ABNORMAL LOW (ref 13.0–17.0)
MCH: 29.7 pg (ref 26.0–34.0)
MCHC: 32.2 g/dL (ref 30.0–36.0)
MCV: 92.1 fL (ref 78.0–100.0)
PLATELETS: 211 10*3/uL (ref 150–400)
RBC: 3.91 MIL/uL — AB (ref 4.22–5.81)
RDW: 13.5 % (ref 11.5–15.5)
WBC: 5.3 10*3/uL (ref 4.0–10.5)

## 2016-05-28 LAB — COMPREHENSIVE METABOLIC PANEL
ALT: 11 U/L — AB (ref 17–63)
AST: 17 U/L (ref 15–41)
Albumin: 3.5 g/dL (ref 3.5–5.0)
Alkaline Phosphatase: 93 U/L (ref 38–126)
Anion gap: 8 (ref 5–15)
BUN: 12 mg/dL (ref 6–20)
CHLORIDE: 105 mmol/L (ref 101–111)
CO2: 25 mmol/L (ref 22–32)
CREATININE: 0.74 mg/dL (ref 0.61–1.24)
Calcium: 9.3 mg/dL (ref 8.9–10.3)
Glucose, Bld: 86 mg/dL (ref 65–99)
Potassium: 3.8 mmol/L (ref 3.5–5.1)
Sodium: 138 mmol/L (ref 135–145)
Total Bilirubin: 1.1 mg/dL (ref 0.3–1.2)
Total Protein: 6.2 g/dL — ABNORMAL LOW (ref 6.5–8.1)

## 2016-05-28 SURGERY — LUMBAR LAMINECTOMY/DECOMPRESSION MICRODISCECTOMY 1 LEVEL
Anesthesia: General | Site: Spine Lumbar | Laterality: Right

## 2016-05-28 MED ORDER — DEXAMETHASONE SODIUM PHOSPHATE 10 MG/ML IJ SOLN
INTRAMUSCULAR | Status: AC
  Filled 2016-05-28: qty 1

## 2016-05-28 MED ORDER — BUPIVACAINE HCL (PF) 0.25 % IJ SOLN
INTRAMUSCULAR | Status: DC | PRN
  Administered 2016-05-28: 10 mL

## 2016-05-28 MED ORDER — LACTATED RINGERS IV SOLN
INTRAVENOUS | Status: DC
  Administered 2016-05-28: 14:00:00 via INTRAVENOUS

## 2016-05-28 MED ORDER — CEFAZOLIN SODIUM 1 G IJ SOLR
INTRAMUSCULAR | Status: DC | PRN
  Administered 2016-05-28: 2 g via INTRAMUSCULAR

## 2016-05-28 MED ORDER — ONDANSETRON HCL 4 MG/2ML IJ SOLN
INTRAMUSCULAR | Status: DC | PRN
  Administered 2016-05-28: 4 mg via INTRAVENOUS

## 2016-05-28 MED ORDER — HYDROMORPHONE HCL 1 MG/ML IJ SOLN: 0.5000 mg | INTRAMUSCULAR | Status: DC | PRN

## 2016-05-28 MED ORDER — SUFENTANIL CITRATE 50 MCG/ML IV SOLN
INTRAVENOUS | Status: DC | PRN
  Administered 2016-05-28: 10 ug via INTRAVENOUS

## 2016-05-28 MED ORDER — LIDOCAINE 2% (20 MG/ML) 5 ML SYRINGE
INTRAMUSCULAR | Status: DC | PRN
  Administered 2016-05-28: 50 mg via INTRAVENOUS

## 2016-05-28 MED ORDER — MEPERIDINE HCL 25 MG/ML IJ SOLN: 6.2500 mg | INTRAMUSCULAR | Status: DC | PRN

## 2016-05-28 MED ORDER — PROPOFOL 10 MG/ML IV BOLUS
INTRAVENOUS | Status: DC | PRN
  Administered 2016-05-28: 120 mg via INTRAVENOUS

## 2016-05-28 MED ORDER — LORATADINE 10 MG PO TABS
10.0000 mg | ORAL_TABLET | Freq: Every day | ORAL | Status: DC
  Administered 2016-05-28 – 2016-05-29 (×2): 10 mg via ORAL
  Filled 2016-05-28 (×2): qty 1

## 2016-05-28 MED ORDER — LIDOCAINE 2% (20 MG/ML) 5 ML SYRINGE
INTRAMUSCULAR | Status: AC
  Filled 2016-05-28: qty 5

## 2016-05-28 MED ORDER — FLUTICASONE PROPIONATE 50 MCG/ACT NA SUSP
2.0000 | Freq: Every day | NASAL | Status: DC
  Administered 2016-05-29: 2 via NASAL
  Filled 2016-05-28: qty 16

## 2016-05-28 MED ORDER — ONDANSETRON HCL 4 MG/2ML IJ SOLN: 4.0000 mg | INTRAMUSCULAR | Status: DC | PRN

## 2016-05-28 MED ORDER — ROCURONIUM BROMIDE 10 MG/ML (PF) SYRINGE
PREFILLED_SYRINGE | INTRAVENOUS | Status: AC
  Filled 2016-05-28: qty 10

## 2016-05-28 MED ORDER — PHENYLEPHRINE 40 MCG/ML (10ML) SYRINGE FOR IV PUSH (FOR BLOOD PRESSURE SUPPORT)
PREFILLED_SYRINGE | INTRAVENOUS | Status: DC | PRN
  Administered 2016-05-28: 80 ug via INTRAVENOUS

## 2016-05-28 MED ORDER — PHENOL 1.4 % MT LIQD
1.0000 | OROMUCOSAL | Status: DC | PRN
  Administered 2016-05-28: 1 via OROMUCOSAL
  Filled 2016-05-28: qty 177

## 2016-05-28 MED ORDER — 0.9 % SODIUM CHLORIDE (POUR BTL) OPTIME
TOPICAL | Status: DC | PRN
  Administered 2016-05-28: 1000 mL

## 2016-05-28 MED ORDER — LISINOPRIL 10 MG PO TABS
10.0000 mg | ORAL_TABLET | Freq: Every day | ORAL | Status: DC
  Administered 2016-05-28 – 2016-05-29 (×2): 10 mg via ORAL
  Filled 2016-05-28 (×2): qty 1

## 2016-05-28 MED ORDER — ACETAMINOPHEN 325 MG PO TABS: 650.0000 mg | ORAL_TABLET | ORAL | Status: DC | PRN

## 2016-05-28 MED ORDER — MECLIZINE HCL 12.5 MG PO TABS
25.0000 mg | ORAL_TABLET | Freq: Three times a day (TID) | ORAL | Status: DC
  Administered 2016-05-28 – 2016-05-29 (×2): 25 mg via ORAL
  Filled 2016-05-28 (×2): qty 2

## 2016-05-28 MED ORDER — FENTANYL CITRATE (PF) 100 MCG/2ML IJ SOLN
25.0000 ug | INTRAMUSCULAR | Status: DC | PRN
  Administered 2016-05-28: 50 ug via INTRAVENOUS

## 2016-05-28 MED ORDER — HYDROCODONE-ACETAMINOPHEN 5-325 MG PO TABS
ORAL_TABLET | ORAL | Status: AC
  Filled 2016-05-28: qty 1

## 2016-05-28 MED ORDER — ASPIRIN 81 MG PO CHEW
81.0000 mg | CHEWABLE_TABLET | Freq: Every day | ORAL | Status: DC
  Administered 2016-05-29: 81 mg via ORAL
  Filled 2016-05-28 (×2): qty 1

## 2016-05-28 MED ORDER — SODIUM CHLORIDE 0.9% FLUSH
3.0000 mL | Freq: Two times a day (BID) | INTRAVENOUS | Status: DC
  Administered 2016-05-28: 3 mL via INTRAVENOUS

## 2016-05-28 MED ORDER — HEMOSTATIC AGENTS (NO CHARGE) OPTIME
TOPICAL | Status: DC | PRN
  Administered 2016-05-28: 1 via TOPICAL

## 2016-05-28 MED ORDER — GLUCOSAMINE SULFATE 750 MG PO CAPS: 750.0000 mg | ORAL_CAPSULE | Freq: Every day | ORAL | Status: DC

## 2016-05-28 MED ORDER — THROMBIN 5000 UNITS EX SOLR
CUTANEOUS | Status: DC | PRN
  Administered 2016-05-28 (×2): 5000 [IU] via TOPICAL

## 2016-05-28 MED ORDER — FENTANYL CITRATE (PF) 100 MCG/2ML IJ SOLN
INTRAMUSCULAR | Status: AC
  Filled 2016-05-28: qty 2

## 2016-05-28 MED ORDER — ACETAMINOPHEN 650 MG RE SUPP: 650.0000 mg | RECTAL | Status: DC | PRN

## 2016-05-28 MED ORDER — CEFAZOLIN IN D5W 1 GM/50ML IV SOLN
1.0000 g | Freq: Three times a day (TID) | INTRAVENOUS | Status: AC
Start: 2016-05-29 — End: 2016-05-29
  Administered 2016-05-29 (×2): 1 g via INTRAVENOUS
  Filled 2016-05-28 (×3): qty 50

## 2016-05-28 MED ORDER — SALINE SPRAY 0.65 % NA SOLN: 2.0000 | NASAL | Status: DC | PRN

## 2016-05-28 MED ORDER — CO-ENZYME Q-10 50 MG PO CAPS: 100.0000 mg | ORAL_CAPSULE | Freq: Every day | ORAL | Status: DC

## 2016-05-28 MED ORDER — PROMETHAZINE HCL 25 MG/ML IJ SOLN: 6.2500 mg | INTRAMUSCULAR | Status: DC | PRN

## 2016-05-28 MED ORDER — ROCURONIUM BROMIDE 10 MG/ML (PF) SYRINGE
PREFILLED_SYRINGE | INTRAVENOUS | Status: DC | PRN
  Administered 2016-05-28: 50 mg via INTRAVENOUS

## 2016-05-28 MED ORDER — MELOXICAM 7.5 MG PO TABS
15.0000 mg | ORAL_TABLET | Freq: Every day | ORAL | Status: DC
  Administered 2016-05-29: 15 mg via ORAL
  Filled 2016-05-28: qty 2

## 2016-05-28 MED ORDER — LACTATED RINGERS IV SOLN: INTRAVENOUS | Status: DC

## 2016-05-28 MED ORDER — SODIUM CHLORIDE 0.9 % IV SOLN
250.0000 mL | INTRAVENOUS | Status: DC
  Administered 2016-05-28: 250 mL via INTRAVENOUS

## 2016-05-28 MED ORDER — HYDROCODONE-ACETAMINOPHEN 5-325 MG PO TABS
1.0000 | ORAL_TABLET | Freq: Four times a day (QID) | ORAL | Status: DC | PRN
  Administered 2016-05-28: 1 via ORAL

## 2016-05-28 MED ORDER — DEXAMETHASONE SODIUM PHOSPHATE 10 MG/ML IJ SOLN
INTRAMUSCULAR | Status: DC | PRN
  Administered 2016-05-28: 4 mg via INTRAVENOUS

## 2016-05-28 MED ORDER — SUGAMMADEX SODIUM 200 MG/2ML IV SOLN
INTRAVENOUS | Status: DC | PRN
  Administered 2016-05-28: 200 mg via INTRAVENOUS

## 2016-05-28 MED ORDER — MENTHOL 3 MG MT LOZG: 1.0000 | LOZENGE | OROMUCOSAL | Status: DC | PRN

## 2016-05-28 MED ORDER — LIDOCAINE-EPINEPHRINE 1 %-1:100000 IJ SOLN
INTRAMUSCULAR | Status: DC | PRN
  Administered 2016-05-28: 10 mL

## 2016-05-28 MED ORDER — ONDANSETRON 4 MG PO TBDP: 4.0000 mg | ORAL_TABLET | Freq: Three times a day (TID) | ORAL | Status: DC | PRN

## 2016-05-28 MED ORDER — PROPOFOL 10 MG/ML IV BOLUS
INTRAVENOUS | Status: AC
  Filled 2016-05-28: qty 20

## 2016-05-28 MED ORDER — ONDANSETRON HCL 4 MG/2ML IJ SOLN
INTRAMUSCULAR | Status: AC
  Filled 2016-05-28: qty 2

## 2016-05-28 MED ORDER — ATORVASTATIN CALCIUM 40 MG PO TABS
40.0000 mg | ORAL_TABLET | Freq: Every day | ORAL | Status: DC
  Administered 2016-05-28: 40 mg via ORAL
  Filled 2016-05-28: qty 1

## 2016-05-28 MED ORDER — BACITRACIN 50000 UNITS IM SOLR
INTRAMUSCULAR | Status: DC | PRN
  Administered 2016-05-28: 18:00:00

## 2016-05-28 MED ORDER — SODIUM CHLORIDE 0.9% FLUSH: 3.0000 mL | INTRAVENOUS | Status: DC | PRN

## 2016-05-28 MED ORDER — SUFENTANIL CITRATE 50 MCG/ML IV SOLN
INTRAVENOUS | Status: AC
  Filled 2016-05-28: qty 1

## 2016-05-28 SURGICAL SUPPLY — 51 items
ADH SKN CLS APL DERMABOND .7 (GAUZE/BANDAGES/DRESSINGS) ×1
APL SKNCLS STERI-STRIP NONHPOA (GAUZE/BANDAGES/DRESSINGS) ×1
BAG DECANTER FOR FLEXI CONT (MISCELLANEOUS) ×2 IMPLANT
BENZOIN TINCTURE PRP APPL 2/3 (GAUZE/BANDAGES/DRESSINGS) ×2 IMPLANT
BLADE CLIPPER SURG (BLADE) ×2 IMPLANT
BLADE SURG 11 STRL SS (BLADE) ×2 IMPLANT
BUR MATCHSTICK NEURO 3.0 LAGG (BURR) ×2 IMPLANT
BUR PRECISION FLUTE 6.0 (BURR) ×2 IMPLANT
CANISTER SUCT 3000ML PPV (MISCELLANEOUS) ×2 IMPLANT
DERMABOND ADVANCED (GAUZE/BANDAGES/DRESSINGS) ×1
DERMABOND ADVANCED .7 DNX12 (GAUZE/BANDAGES/DRESSINGS) ×1 IMPLANT
DRAPE HALF SHEET 40X57 (DRAPES) IMPLANT
DRAPE LAPAROTOMY 100X72X124 (DRAPES) ×2 IMPLANT
DRAPE MICROSCOPE LEICA (MISCELLANEOUS) ×2 IMPLANT
DRAPE POUCH INSTRU U-SHP 10X18 (DRAPES) ×2 IMPLANT
DRAPE SURG 17X23 STRL (DRAPES) ×2 IMPLANT
DRSG OPSITE POSTOP 4X6 (GAUZE/BANDAGES/DRESSINGS) ×2 IMPLANT
DURAPREP 26ML APPLICATOR (WOUND CARE) ×2 IMPLANT
ELECT BLADE 4.0 EZ CLEAN MEGAD (MISCELLANEOUS) ×2
ELECT REM PT RETURN 9FT ADLT (ELECTROSURGICAL) ×2
ELECTRODE BLDE 4.0 EZ CLN MEGD (MISCELLANEOUS) ×1 IMPLANT
ELECTRODE REM PT RTRN 9FT ADLT (ELECTROSURGICAL) ×1 IMPLANT
GAUZE SPONGE 4X4 12PLY STRL (GAUZE/BANDAGES/DRESSINGS) ×2 IMPLANT
GAUZE SPONGE 4X4 16PLY XRAY LF (GAUZE/BANDAGES/DRESSINGS) IMPLANT
GLOVE BIO SURGEON STRL SZ 6.5 (GLOVE) ×6 IMPLANT
GLOVE BIO SURGEON STRL SZ8 (GLOVE) ×2 IMPLANT
GLOVE BIOGEL PI IND STRL 6.5 (GLOVE) ×2 IMPLANT
GLOVE BIOGEL PI IND STRL 7.5 (GLOVE) ×1 IMPLANT
GLOVE BIOGEL PI INDICATOR 6.5 (GLOVE) ×2
GLOVE BIOGEL PI INDICATOR 7.5 (GLOVE) ×1
GLOVE INDICATOR 8.5 STRL (GLOVE) ×2 IMPLANT
GOWN STRL REUS W/ TWL LRG LVL3 (GOWN DISPOSABLE) ×1 IMPLANT
GOWN STRL REUS W/ TWL XL LVL3 (GOWN DISPOSABLE) ×2 IMPLANT
GOWN STRL REUS W/TWL LRG LVL3 (GOWN DISPOSABLE) ×2
GOWN STRL REUS W/TWL XL LVL3 (GOWN DISPOSABLE) ×4
KIT BASIN OR (CUSTOM PROCEDURE TRAY) ×2 IMPLANT
KIT ROOM TURNOVER OR (KITS) ×2 IMPLANT
NEEDLE HYPO 22GX1.5 SAFETY (NEEDLE) ×2 IMPLANT
NEEDLE SPNL 22GX3.5 QUINCKE BK (NEEDLE) ×2 IMPLANT
NS IRRIG 1000ML POUR BTL (IV SOLUTION) ×2 IMPLANT
PACK LAMINECTOMY NEURO (CUSTOM PROCEDURE TRAY) ×2 IMPLANT
RUBBERBAND STERILE (MISCELLANEOUS) ×4 IMPLANT
SPONGE SURGIFOAM ABS GEL SZ50 (HEMOSTASIS) ×2 IMPLANT
STRIP CLOSURE SKIN 1/2X4 (GAUZE/BANDAGES/DRESSINGS) ×2 IMPLANT
SUT VIC AB 0 CT1 18XCR BRD8 (SUTURE) ×1 IMPLANT
SUT VIC AB 0 CT1 8-18 (SUTURE) ×2
SUT VIC AB 2-0 CT1 18 (SUTURE) ×2 IMPLANT
SUT VICRYL 4-0 PS2 18IN ABS (SUTURE) ×2 IMPLANT
TOWEL OR 17X24 6PK STRL BLUE (TOWEL DISPOSABLE) ×2 IMPLANT
TOWEL OR 17X26 10 PK STRL BLUE (TOWEL DISPOSABLE) ×2 IMPLANT
WATER STERILE IRR 1000ML POUR (IV SOLUTION) ×2 IMPLANT

## 2016-05-28 NOTE — Transfer of Care (Signed)
Immediate Anesthesia Transfer of Care Note  Patient: [name]  Procedure(s) Performed: Procedure(s): Right Lumbar Three-Four Microdiscectomy (Right)  Patient Location: PACU  Anesthesia Type:General  Level of Consciousness: awake, oriented and patient cooperative  Airway & Oxygen Therapy: Patient Spontanous Breathing and Patient connected to nasal cannula oxygen  Post-op Assessment: Report given to RN  Post vital signs: Reviewed and stable  Last Vitals:  Vitals:   05/28/16 1312  BP: 123/64  Pulse: (!) 59  Resp: 20  Temp: 36.5 C    Last Pain:  Vitals:   05/28/16 1312  TempSrc: Oral         Complications: No apparent anesthesia complications

## 2016-05-28 NOTE — Progress Notes (Signed)
Pt with wife at bedside found to be assisting pt by self with urinal. Pt had climbed through the bottom of the stretcher while side rails up despite education to call for assistance and not to get up out of bed.  Reinforced safety plan despite wife asking staff and pt asking staff not to talk.  Call bell within reach and pt able to demonstrate how to push call bell.

## 2016-05-28 NOTE — Progress Notes (Signed)
Upon arrival into patients room patient and his wife informed Nurse that patient fell in the bathroom on his buttocks before coming to short stay. Patient denied hitting is head. Nurse called into Neuro OR an asked OR Nurse to inform Dr. Saintclair Halsted of this. OR Nurse stated she would let him know.

## 2016-05-28 NOTE — Anesthesia Preprocedure Evaluation (Addendum)
Anesthesia Evaluation  Patient identified by MRN, date of birth, ID band Patient awake    Reviewed: Allergy & Precautions, NPO status , Patient's Chart, lab work & pertinent test results  Airway Mallampati: II  TM Distance: >3 FB Neck ROM: Full    Dental  (+) Teeth Intact, Dental Advisory Given, Missing,    Pulmonary sleep apnea , former smoker,    breath sounds clear to auscultation       Cardiovascular hypertension, Pt. on medications  Rhythm:Regular Rate:Normal     Neuro/Psych PSYCHIATRIC DISORDERS Anxiety    GI/Hepatic negative GI ROS, Neg liver ROS,   Endo/Other  negative endocrine ROS  Renal/GU negative Renal ROS  negative genitourinary   Musculoskeletal  (+) Arthritis , Osteoarthritis,    Abdominal   Peds negative pediatric ROS (+)  Hematology negative hematology ROS (+)   Anesthesia Other Findings   Reproductive/Obstetrics negative OB ROS                            Lab Results  Component Value Date   WBC 5.3 05/28/2016   HGB 11.6 (L) 05/28/2016   HCT 36.0 (L) 05/28/2016   MCV 92.1 05/28/2016   PLT 211 05/28/2016   05/2016 EKG: normal sinus rhythm.  Anesthesia Physical Anesthesia Plan  ASA: II  Anesthesia Plan: General   Post-op Pain Management:    Induction: Intravenous  Airway Management Planned: Oral ETT  Additional Equipment:   Intra-op Plan:   Post-operative Plan: Extubation in OR  Informed Consent: I have reviewed the patients History and Physical, chart, labs and discussed the procedure including the risks, benefits and alternatives for the proposed anesthesia with the patient or authorized representative who has indicated his/her understanding and acceptance.   Dental advisory given  Plan Discussed with: CRNA  Anesthesia Plan Comments: (Pt states he fell afte slipping in bath this morning. He denies dizziness, LOC and head trauma. He currently  complains of some buttock pain only. )       Anesthesia Quick Evaluation

## 2016-05-28 NOTE — Progress Notes (Signed)
Placed patient on CPAP for the night via home settings at 10cm

## 2016-05-28 NOTE — Anesthesia Postprocedure Evaluation (Signed)
Anesthesia Post Note  Patient: [name]  Procedure(s) Performed: Procedure(s) (LRB): Right Lumbar Three-Four Microdiscectomy (Right)  Patient location during evaluation: PACU Anesthesia Type: General Level of consciousness: awake Pain management: pain level controlled Vital Signs Assessment: post-procedure vital signs reviewed and stable Respiratory status: spontaneous breathing Cardiovascular status: stable Postop Assessment: no signs of nausea or vomiting Anesthetic complications: no    Last Vitals:  Vitals:   05/28/16 1932 05/28/16 1956  BP: (!) 141/76 (!) 148/78  Pulse: 63 68  Resp: 16 16  Temp:  36.5 C    Last Pain:  Vitals:   05/28/16 1956  TempSrc: Oral  PainSc:                  Sabrea Sankey

## 2016-05-28 NOTE — Anesthesia Procedure Notes (Signed)
Procedure Name: Intubation Date/Time: 05/28/2016 5:42 PM Performed by: Melina Copa, Niyah Mamaril R Pre-anesthesia Checklist: Patient identified, Emergency Drugs available, Suction available and Patient being monitored Patient Re-evaluated:Patient Re-evaluated prior to inductionOxygen Delivery Method: Circle System Utilized Preoxygenation: Pre-oxygenation with 100% oxygen Intubation Type: IV induction Ventilation: Mask ventilation without difficulty Laryngoscope Size: Mac and 4 Grade View: Grade II Tube type: Oral Tube size: 8.0 mm Number of attempts: 1 Airway Equipment and Method: Stylet and Oral airway Placement Confirmation: ETT inserted through vocal cords under direct vision,  positive ETCO2 and breath sounds checked- equal and bilateral Secured at: 22 cm Tube secured with: Tape Dental Injury: Teeth and Oropharynx as per pre-operative assessment

## 2016-05-28 NOTE — Progress Notes (Signed)
PHARMACIST - PHYSICIAN ORDER COMMUNICATION  CONCERNING: P&T Medication Policy on Herbal Medications  DESCRIPTION:  This patient's order for:  Co Q10, glucosamine  has been noted.  This product(s) is classified as an "herbal" or natural product. Due to a lack of definitive safety studies or FDA approval, nonstandard manufacturing practices, plus the potential risk of unknown drug-drug interactions while on inpatient medications, the Pharmacy and Therapeutics Committee does not permit the use of "herbal" or natural products of this type within [facility].   ACTION TAKEN: The pharmacy department is unable to verify this order at this time and your patient has been informed of this safety policy. Please reevaluate patient's clinical condition at discharge and address if the herbal or natural product(s) should be resumed at that time.  Renold Genta, PharmD, [place] Pharmacist Main pharmacy [phone] 05/28/2016 8:05 PM

## 2016-05-28 NOTE — Op Note (Signed)
Preoperative diagnosis: Lumbar spinal stenosis L3-4 with right-sided L4 radiculopathy  Postoperative diagnosis: Same  Procedure: Decompressive lumbar laboratory L3-4 on the right with microdissection of the right L4 nerve root microscopic foraminotomy  Surgeon: [name]  Anesthesia:  EBL: Normal  History of present illness: Patient is a very pleasant 82 year old gentleman is a progressive worsening back and right leg pain rating down lateral thigh anterior shin consistent with an L4 nerve root pattern. Workup revealed severe spinal stenosis at L3-4 with severe lateral recess stenosis the L4 nerve root. Patient failed conservative treatment imaging findings and progressive clinical syndrome are recommended decompressive laminectomy at that level. I extensively went over the risks and benefits of the operation with the patient as well as perioperative course expectations of outcome and alternatives of surgery and he understood and agreed to proceed forward.  Operative procedure: Patient was brought into the oral was induced under general anesthesia positioned prone on the Wilson frame and his back was prepped and draped in routine sterile fashion preoperative x-ray localized the appropriate level so after infiltration of 10 mL lidocaine with epi a midline incision was made above the car was used to take down substantially since subperiosteal dissection carried lamina of L3 and L4 on the right. Interoperative x-ray confirmed the appropriate level. Then a high-speed drill was used to drill down the inferior lamina of L3 medial facet complex super aspect of limit all 4. Laminotomy was begun with a 3 mm Kerrison punch. The ligament flavum was noted markedly hypertrophied and there was a severe enlargement spur coming off the medial aspect of the facet causing severe hourglass compression of thecal sac. So under microscopic illumination this was teased off of the dura and removed in piecemeal fashion. A  dense amount of hypertrophied ligament as well as a large spur were removed identifying the L4 nerve root on the right and then marching superiorly under bit the medial facet complex decompressive lateral canal and the undersurface of the L3 nerve at its takeoff. At the end of decompression there is no further stenosis I inspected the disc space it was not herniated disc felt to be spondylitic or left alone. The foramen L4 was widely patent. Was a copious irrigant since this was maintained Gelfoam was awakened top of the dura and the muscle fascia proximal medullary is with interrupted Vicryl and the skin was closed with a running 4 subcuticular Dermabond benzoin Steri-Strips and sterile dressing was applied and patient currently stable condition. At the L needle counts sponge counts were correct.

## 2016-05-29 ENCOUNTER — Encounter (HOSPITAL_COMMUNITY): Payer: Self-pay | Admitting: Neurosurgery

## 2016-05-29 DIAGNOSIS — M4806 Spinal stenosis, lumbar region: Secondary | ICD-10-CM | POA: Diagnosis not present

## 2016-05-29 MED ORDER — HYDROCODONE-ACETAMINOPHEN 5-325 MG PO TABS: 1.0000 | ORAL_TABLET | Freq: Four times a day (QID) | ORAL | 0 refills | Status: DC | PRN

## 2016-05-29 NOTE — Evaluation (Signed)
Physical Therapy Evaluation & Discharge Patient Details Name: [name] MRN: [id number] DOB: [birth year] Today's Date: 05/29/2016   History of Present Illness  Patient is an 82 y/o male admitted with lumbar SS L3-4 with radiculopathy.  Now s/p Decompressive lumbar laboratory L3-4 on the right with microdissection of the right L4 nerve root microscopic foraminotomy  Clinical Impression  Patient presents with decreased independence with mobility due to deficits listed in PT problem list. He will benefit from follow up [place] at d/c and wife assist initially for safe mobility.  No further skilled acute PT needs.  Okay to d/c home today with assist, education completed.     Follow Up Recommendations Home health PT    Equipment Recommendations  None recommended by PT    Recommendations for Other Services       Precautions / Restrictions Precautions Precautions: Fall;Back Precaution Booklet Issued: Yes (comment)      Mobility  Bed Mobility Overal bed mobility: Needs Assistance Bed Mobility: Rolling;Sidelying to Sit Rolling: Min assist Sidelying to sit: Min assist       General bed mobility comments: use of rail, cues throughout, assist for hips to roll and for trunk to sit  Transfers Overall transfer level: Needs assistance Equipment used: Rolling walker (2 wheeled) Transfers: Sit to/from Stand Sit to Stand: Mod assist         General transfer comment: assist due to initial posterior bias and needed help to prevent LOB; to sit cues for slow controlled lowering, but still landed with some uncontrolled descent  Ambulation/Gait Ambulation/Gait assistance: Supervision Ambulation Distance (Feet): 150 Feet Assistive device: Rolling walker (2 wheeled) Gait Pattern/deviations: Step-through pattern;Trunk flexed;Decreased stride length;Antalgic     General Gait Details: limping some on R, cues for posture, reports pain much better then pre surgery  Stairs             Wheelchair Mobility    Modified Rankin (Stroke Patients Only)       Balance Overall balance assessment: Needs assistance         Standing balance support: Bilateral upper extremity supported Standing balance-Leahy Scale: Poor Standing balance comment: assist to prevent initial posterior LOB                              Pertinent Vitals/Pain Pain Assessment: 0-10 Pain Score: 2  Pain Location: R leg mid calf Pain Descriptors / Indicators: Discomfort Pain Intervention(s): Monitored during session    Home Living Family/patient expects to be discharged to:: Private residence Living Arrangements: Spouse/significant other Available Help at Discharge: Family;Available 24 hours/day Type of Home: Independent living facility Home Access: Level entry     Home Layout: One level Home Equipment: Walker - 4 wheels;Wheelchair - power;Shower seat;Grab bars - tub/shower;Grab bars - toilet;Toilet riser      Prior Function Level of Independence: Independent with assistive device(s)               Hand Dominance        Extremity/Trunk Assessment   Upper Extremity Assessment: RUE deficits/detail;LUE deficits/detail RUE Deficits / Details: reports limtied R shoulder elevation from previous cervical laminectomy     LUE Deficits / Details: WFL   Lower Extremity Assessment: RLE deficits/detail RLE Deficits / Details: AROM grossly WFL, strength hip flexion 3/5, knee extension 4-/5, ankle DF 4+/5    Cervical / Trunk Assessment: Kyphotic  Communication   Communication: No difficulties  Cognition Arousal/Alertness: Awake/alert Behavior During Therapy:  WFL for tasks assessed/performed Overall Cognitive Status: Within Functional Limits for tasks assessed                      General Comments General comments (skin integrity, edema, etc.): wife and son in room and educated pt needs supervision or some assist initially when standing to prevent posterior  LOB; also reviewed all back precaution education     Exercises     Assessment/Plan    PT Assessment All further PT needs can be met in the next venue of care  PT Problem List Decreased mobility;Decreased balance;Decreased knowledge of precautions;Pain;Decreased knowledge of use of DME          PT Treatment Interventions      PT Goals (Current goals can be found in the Care Plan section)  Acute Rehab PT Goals Patient Stated Goal: To go home today PT Goal Formulation: All assessment and education complete, DC therapy    Frequency     Barriers to discharge        Co-evaluation               End of Session Equipment Utilized During Treatment: Gait belt Activity Tolerance: Patient tolerated treatment well Patient left: in chair;with call bell/phone within reach;with family/visitor present      Functional Assessment Tool Used: Clinical Judgement Functional Limitation: Mobility: Walking and moving around Mobility: Walking and Moving Around Current Status (C3818): At least 20 percent but less than 40 percent impaired, limited or restricted Mobility: Walking and Moving Around Goal Status [phone]): At least 1 percent but less than 20 percent impaired, limited or restricted Mobility: Walking and Moving Around Discharge Status [phone]): At least 1 percent but less than 20 percent impaired, limited or restricted    Time: [phone] PT Time Calculation (min) (ACUTE ONLY): 40 min   Charges:   PT Evaluation $PT Eval Moderate Complexity: 1 Procedure PT Treatments $Gait Training: 8-22 mins $Self Care/Home Management: 8-22   PT G Codes:   PT G-Codes **NOT FOR INPATIENT CLASS** Functional Assessment Tool Used: Clinical Judgement Functional Limitation: Mobility: Walking and moving around Mobility: Walking and Moving Around Current Status (C4818): At least 20 percent but less than 40 percent impaired, limited or restricted Mobility: Walking and Moving Around Goal Status [phone]):  At least 1 percent but less than 20 percent impaired, limited or restricted Mobility: Walking and Moving Around Discharge Status [phone]): At least 1 percent but less than 20 percent impaired, limited or restricted    Reginia Naas 05/29/2016, 12:59 PM  Magda Kiel, [place] 05/29/2016

## 2016-05-29 NOTE — Care Management Note (Signed)
Case Management Note  Patient Details  Name: [name] MRN: [id number] Date of Birth: [birth year]  Subjective/Objective:                    Action/Plan: Pt discharging home with orders for [facility] PT. Pt is from the independent living part of [facility]. [facility] called CM and informed them that their therapy department could provide the services if patient in agreement. CM spoke to Mr Olkowski and he would like to use the therapy services through [facility]. Orders for [facility] faxed to [place] at the number they provided: [phone]. Bedside RN updated.  Expected Discharge Date:                  Expected Discharge Plan:  [place]  In-House Referral:     Discharge planning Services  CM Consult  Post Acute Care Choice:    Choice offered to:  Patient  DME Arranged:    DME Agency:     HH Arranged:  PT [place]:   (therapy department at [facility])  Status of Service:  Completed, signed off  If discussed at [place] of Stay Meetings, dates discussed:    Additional Comments:  Pollie Friar, RN 05/29/2016, 2:14 PM

## 2016-05-29 NOTE — Care Management Obs Status (Signed)
[place] NOTIFICATION   Patient Details  Name: [name] MRN: [id number] Date of Birth: 11/05/1930   Medicare Observation Status Notification Given:  Yes    Pollie Friar, RN 05/29/2016, 11:48 AM

## 2016-05-29 NOTE — Progress Notes (Addendum)
Pt discharged home with family, by car, assessment stable, discharge instructions reviewed, 1 printed prescriptions given, all questions answered. IV removed. Pt taken by wheelchair to exit by volunteers.  Pt given his cpap mask and tubing and incentive spirometer.   Time of discharge: 1440

## 2016-05-29 NOTE — Discharge Instructions (Signed)
No lifting no bending no driving no riding in a car unless he is coming forth to see me. Keep incision clean dry and intact. May remove the outer dressing in 2-3 days leave steri-Strips on and intact. Cover the Steri-Strips with saran wrap for showers only.

## 2016-05-29 NOTE — Progress Notes (Signed)
Patient ID: [name], male   DOB: 1931/05/08, 82 y.o.   MRN: [id number] Patient doing well with minimal k pain no leg pain much better thanpreop  Strength 5 out of 5 wound clean dry and intact  Probable discharge later they will work with physical therapy and ensure  Patient's safety and [facility]

## 2016-05-29 NOTE — Discharge Summary (Signed)
  Physician Discharge Summary  Patient ID: [name] MRN: [id number] DOB/AGE: 1931/01/14 82 y.o.  Admit date: 05/28/2016 Discharge date: 05/29/2016  Admission Diagnoses:lumbar spinal stenosis right L4 radiculopathy  Discharge Diagnoses: same Active Problems:   Spinal stenosis of lumbar region   Discharged Condition: good  Hospital Course: patient is admitted the hospital underwent decompressive laminotomy on the right at L3-4 postoperatively patient did fairly well with recovered in the floor on the floor he convalesced well overnight he will immobilize this morning seen by physical therapy and discharged later today if cleared.  Consults: Significant Diagnostic Studies: Treatments:decompressive lumbar laminectomy right L3-4 Discharge Exam: Blood pressure (!) 122/56, pulse 73, temperature 97.8 F (36.6 C), temperature source Oral, resp. rate 18, height 5\' 8"  (1.727 m), weight 86.6 kg (190 lb 14.7 oz), SpO2 99 %. Strength 5 out of 5 wound clean dry and intact  Disposition: home     Medication List    TAKE these medications   ARTHRO-COMPLEX PO Take 1,000 mg by mouth daily.   aspirin 81 MG tablet Take 81 mg by mouth daily.   atorvastatin 40 MG tablet Commonly known as:  LIPITOR Take 40 mg by mouth daily.   cetirizine 10 MG tablet Commonly known as:  ZYRTEC Take 10 mg by mouth daily as needed for allergies.   co-enzyme Q-10 50 MG capsule Take 100 mg by mouth daily.   fluticasone 50 MCG/ACT nasal spray Commonly known as:  FLONASE Place 2 sprays into both nostrils daily.   Glucosamine Sulfate 750 MG Caps Take 750 mg by mouth daily.   HYDROcodone-acetaminophen 5-325 MG tablet Commonly known as:  NORCO/VICODIN Take 1 tablet by mouth every 6 (six) hours as needed for pain. What changed:  Another medication with the same name was added. Make sure you understand how and when to take each.   HYDROcodone-acetaminophen 5-325 MG tablet Commonly known as:   NORCO/VICODIN Take 1 tablet by mouth every 6 (six) hours as needed for moderate pain. What changed:  You were already taking a medication with the same name, and this prescription was added. Make sure you understand how and when to take each.   lisinopril 10 MG tablet Commonly known as:  PRINIVIL,ZESTRIL Take 10 mg by mouth daily.   meclizine 25 MG tablet Commonly known as:  ANTIVERT Take 25 mg by mouth 3 (three) times daily.   meloxicam 15 MG tablet Commonly known as:  MOBIC Take 15 mg by mouth daily.   ondansetron 4 MG disintegrating tablet Commonly known as:  ZOFRAN ODT Take 1 tablet (4 mg total) by mouth every 8 (eight) hours as needed for nausea or vomiting.   PRESCRIPTION MEDICATION Cortizone shot in lumber spine 10/27/13   sodium chloride 0.65 % Soln nasal spray Commonly known as:  OCEAN Place 2 sprays into both nostrils as needed for congestion.      Follow-up Information    Laisha Rau P, MD Follow up in 2 day(s).   Specialty:  Neurosurgery Contact information: [address] [place] [address] [phone]        Elaina Hoops, MD .   Specialty:  Neurosurgery Contact information: [address] [place] [address] [phone]           Signed: Elaina Hoops 05/29/2016, 7:43 AM

## 2016-06-18 ENCOUNTER — Ambulatory Visit (INDEPENDENT_AMBULATORY_CARE_PROVIDER_SITE_OTHER): Payer: Medicare Other | Admitting: Orthopedic Surgery

## 2016-06-18 DIAGNOSIS — M25561 Pain in right knee: Secondary | ICD-10-CM | POA: Diagnosis not present

## 2016-07-04 ENCOUNTER — Encounter (INDEPENDENT_AMBULATORY_CARE_PROVIDER_SITE_OTHER): Payer: Self-pay | Admitting: Orthopedic Surgery

## 2016-07-04 ENCOUNTER — Ambulatory Visit (INDEPENDENT_AMBULATORY_CARE_PROVIDER_SITE_OTHER): Payer: Medicare Other | Admitting: Orthopedic Surgery

## 2016-07-04 ENCOUNTER — Ambulatory Visit (INDEPENDENT_AMBULATORY_CARE_PROVIDER_SITE_OTHER): Payer: Self-pay

## 2016-07-04 DIAGNOSIS — M1611 Unilateral primary osteoarthritis, right hip: Secondary | ICD-10-CM

## 2016-07-04 DIAGNOSIS — M25551 Pain in right hip: Secondary | ICD-10-CM | POA: Diagnosis not present

## 2016-07-04 DIAGNOSIS — S83241A Other tear of medial meniscus, current injury, right knee, initial encounter: Secondary | ICD-10-CM

## 2016-07-04 NOTE — Progress Notes (Signed)
Office Visit Note   Patient: [name]           Date of Birth: 14-May-1931           MRN: [id number] Visit Date: 07/04/2016 Requested by: Lawerance Cruel, MD [place], [place] [address] PCP: Melinda Crutch, MD  Subjective: Chief Complaint  Patient presents with  . Right Knee - Pain  .  He returns today with complaints of knee pain primarily on the right-hand side.  He's had an MRI scan of the right knee which is reviewed.  At scan essentially shows a small flap tear of the medial meniscus but otherwise unremarkable.  There is no stress fracture or stress reaction.  He denies much in the way of back pain and really doesn't report much groin pain but has that occasionally.  Patient returns today s/p right knee MRI scan. Says the right knee feels terrible, he just cannot move it.  Severe pain.  He slides it, does not really try to move it much.  His lumbar spine surgery per Dr Saintclair Halsted was 05/28/16.  He is taking hydrocodone and mobic for the pain.                Review of Systems all systems reviewed and negative is a related to the chief complaint mid fevers or chills he is in a wheelchair no family history of DVT or pulmonary embolism   Assessment & Plan: Visit Diagnoses:  1. Arthritis of right hip   2. Other tear of medial meniscus of right knee, unspecified whether old or current tear, initial encounter   3. Right hip pain     Plan: Impression is endstage right hip arthritis with referred pain to the medial aspect of the knee.  Plan along talk with.  His wife today about operative and nonoperative management of this problem.  Operative management would be hip replacement.  Patient understands the risks and benefits of hip replacement including not limited to infection dislocation or vessel damage the amount of recovery that requires also discussed.  All questions answered.  Follow-Up Instructions: No Follow-up on file.   Orders:  Orders Placed This Encounter    Procedures  . XR HIP UNILAT W OR W/O PELVIS 2-3 VIEWS RIGHT   No orders of the defined types were placed in this encounter.     Procedures: No procedures performed   Clinical Data: No additional findings.  Objective: Vital Signs: There were no vitals taken for this visit.  Physical Exam  Constitutional: He appears well-developed.  HENT:  Head: Normocephalic.  Eyes: EOM are normal.  Neck: Normal range of motion.  Cardiovascular: Normal rate.   Pulmonary/Chest: Effort normal.  Neurological: He is alert.  Skin: Skin is warm.  Psychiatric: He has a normal mood and affect.    Ortho Exam on examination of the right leg he does have weakness to hip flexion.  He's had good ankle dorsi flexion plantar flexion bilaterally.  Mild edema in both legs.  Doesn't have much in the way of medial joint line tenderness on the right or left.  He does have pretty restricted hip range of motion on the right compared to the left.  Hip radiographs do demonstrate endstage arthritis on the right-hand side  Specialty Comments:  No specialty comments available.  Imaging: Xr Hip Unilat W Or W/o Pelvis 2-3 Views Right  Result Date: 07/04/2016 2 views right hip ordered and obtained shows endstage arthritis in the right  hip with some collapse of the femoral head.  Left hip on the AP pelvis has a maintained joint space    PMFS History: Patient Active Problem List   Diagnosis Date Noted  . Spinal stenosis of lumbar region 05/28/2016  . Hypercholesteremia    Past Medical History:  Diagnosis Date  . Anxiety   . Arthritis   . Complication of anesthesia    states "I was in [place] for several weeks after surgery"  . Hard of hearing   . Hypercholesteremia   . Hypertension   . Sleep apnea   . Urinary frequency   . Vertigo   . Wears glasses     No family history on file.  Past Surgical History:  Procedure Laterality Date  . BACK SURGERY    . COLONOSCOPY    . ESOPHAGOGASTRODUODENOSCOPY     . LUMBAR LAMINECTOMY/DECOMPRESSION MICRODISCECTOMY Right 05/28/2016   Procedure: Right Lumbar Three-Four Microdiscectomy;  Surgeon: [name], MD;  Location: [facility];  Service: Neurosurgery;  Laterality: Right;   Social History   Occupational History  . Not on file.   Social History Main Topics  . Smoking status: Former Research scientist (life sciences)  . Smokeless tobacco: Never Used     Comment: quit 50 years ago  . Alcohol use No  . Drug use: No  . Sexual activity: Not on file

## 2016-07-08 ENCOUNTER — Other Ambulatory Visit (INDEPENDENT_AMBULATORY_CARE_PROVIDER_SITE_OTHER): Payer: Self-pay | Admitting: Orthopedic Surgery

## 2016-07-08 DIAGNOSIS — M1711 Unilateral primary osteoarthritis, right knee: Secondary | ICD-10-CM

## 2016-07-09 ENCOUNTER — Ambulatory Visit (INDEPENDENT_AMBULATORY_CARE_PROVIDER_SITE_OTHER): Payer: Medicare Other | Admitting: Orthopedic Surgery

## 2016-07-15 NOTE — Pre-Procedure Instructions (Addendum)
[name]  07/15/2016      [place], [address] [place] [address] Phone: [phone] Fax: [phone]    Your procedure is scheduled on Monday, November 13.  Report to [facility] Admitting at 5:30 A.M.  Call this number if you have problems the morning of surgery:  [phone]   Remember:  Do not eat food or drink liquids after midnight.  Take these medicines the morning of surgery with A SIP OF WATER: , flonase if needed, hydrocodone (Vicodin) if needed   STOPTODAY taking any Aspirin, meloxicam (Mobic), coq10, glucosamine/ triple,Aleve, Naproxen, Ibuprofen, Motrin, Advil, Goody's, BC's, all herbal medications, fish oil, and all vitamins     Do not wear jewelry, make-up or nail polish.  Do not wear lotions, powders, or perfumes, or deoderant.  Do not shave 48 hours prior to surgery.  Men may shave face and neck.  Do not bring valuables to the hospital.  [facility] is not responsible for any belongings or valuables.  Contacts, dentures or bridgework may not be worn into surgery.  Leave your suitcase in the car.  After surgery it may be brought to your room.  For patients admitted to the hospital, discharge time will be determined by your treatment team.  Patients discharged the day of surgery will not be allowed to drive home.    Special instructions:    [place]- Preparing For Surgery  Before surgery, you can play an important role. Because skin is not sterile, your skin needs to be as free of germs as possible. You can reduce the number of germs on your skin by washing with CHG (chlorahexidine gluconate) Soap before surgery.  CHG is an antiseptic cleaner which kills germs and bonds with the skin to continue killing germs even after washing.  Please do not use if you have an allergy to CHG or antibacterial soaps. If your skin becomes reddened/irritated stop using the CHG.   Do not shave (including legs and underarms) for at least 48 hours prior to first CHG shower. It is OK to shave your face.  Please follow these instructions carefully.   1. Shower the NIGHT BEFORE SURGERY and the MORNING OF SURGERY with CHG.   2. If you chose to wash your hair, wash your hair first as usual with your normal shampoo.  3. After you shampoo, rinse your hair and body thoroughly to remove the shampoo.  4. Use CHG as you would any other liquid soap. You can apply CHG directly to the skin and wash gently with a scrungie or a clean washcloth.   5. Apply the CHG Soap to your body ONLY FROM THE NECK DOWN.  Do not use on open wounds or open sores. Avoid contact with your eyes, ears, mouth and genitals (private parts). Wash genitals (private parts) with your normal soap.  6. Wash thoroughly, paying special attention to the area where your surgery will be performed.  7. Thoroughly rinse your body with warm water from the neck down.  8. DO NOT shower/wash with your normal soap after using and rinsing off the CHG Soap.  9. Pat yourself dry with a CLEAN TOWEL.   10. Wear CLEAN PAJAMAS   11. Place CLEAN SHEETS on your bed the night of your first shower and DO NOT SLEEP WITH PETS.    Day of Surgery: Do not apply any deodorants/lotions. Please wear clean clothes to  the hospital/surgery center.      Please read over the fact sheets that you were given.

## 2016-07-16 ENCOUNTER — Encounter (HOSPITAL_COMMUNITY): Payer: Self-pay

## 2016-07-16 ENCOUNTER — Encounter (HOSPITAL_COMMUNITY)
Admission: RE | Admit: 2016-07-16 | Discharge: 2016-07-16 | Disposition: A | Discharge status: Home / Self Care | Payer: Medicare Other | Source: Ambulatory Visit | Attending: Orthopedic Surgery | Admitting: Orthopedic Surgery

## 2016-07-16 DIAGNOSIS — M1611 Unilateral primary osteoarthritis, right hip: Secondary | ICD-10-CM | POA: Diagnosis not present

## 2016-07-16 DIAGNOSIS — Z01818 Encounter for other preprocedural examination: Secondary | ICD-10-CM | POA: Diagnosis not present

## 2016-07-16 LAB — URINALYSIS, ROUTINE W REFLEX MICROSCOPIC
Bilirubin Urine: NEGATIVE
GLUCOSE, UA: NEGATIVE mg/dL
Hgb urine dipstick: NEGATIVE
KETONES UR: NEGATIVE mg/dL
LEUKOCYTES UA: NEGATIVE
NITRITE: NEGATIVE
PH: 7 (ref 5.0–8.0)
Protein, ur: NEGATIVE mg/dL
SPECIFIC GRAVITY, URINE: 1.02 (ref 1.005–1.030)

## 2016-07-16 LAB — BASIC METABOLIC PANEL
Anion gap: 6 (ref 5–15)
BUN: 13 mg/dL (ref 6–20)
CHLORIDE: 107 mmol/L (ref 101–111)
CO2: 27 mmol/L (ref 22–32)
CREATININE: 0.64 mg/dL (ref 0.61–1.24)
Calcium: 9.5 mg/dL (ref 8.9–10.3)
GFR calc Af Amer: >60 mL/min (ref >60)
GFR calc non Af Amer: >60 mL/min (ref >60)
GLUCOSE: 92 mg/dL (ref 65–99)
POTASSIUM: 3.9 mmol/L (ref 3.5–5.1)
SODIUM: 140 mmol/L (ref 135–145)

## 2016-07-16 LAB — TYPE AND SCREEN
ABO/RH(D): O NEG
Antibody Screen: NEGATIVE

## 2016-07-16 LAB — SURGICAL PCR SCREEN
MRSA, PCR: NEGATIVE
Staphylococcus aureus: NEGATIVE

## 2016-07-16 LAB — CBC
HEMATOCRIT: 39 % (ref 39.0–52.0)
Hemoglobin: 12.7 g/dL — ABNORMAL LOW (ref 13.0–17.0)
MCH: 29.2 pg (ref 26.0–34.0)
MCHC: 32.6 g/dL (ref 30.0–36.0)
MCV: 89.7 fL (ref 78.0–100.0)
PLATELETS: 188 10*3/uL (ref 150–400)
RBC: 4.35 MIL/uL (ref 4.22–5.81)
RDW: 14.6 % (ref 11.5–15.5)
WBC: 4.9 10*3/uL (ref 4.0–10.5)

## 2016-07-16 LAB — ABO/RH: ABO/RH(D): O NEG

## 2016-07-17 LAB — URINE CULTURE

## 2016-07-21 ENCOUNTER — Inpatient Hospital Stay (HOSPITAL_COMMUNITY): Payer: Medicare Other

## 2016-07-21 ENCOUNTER — Encounter (HOSPITAL_COMMUNITY)
Admission: RE | Disposition: A | Discharge status: Skilled Nursing Facility | Payer: Self-pay | Source: Ambulatory Visit | Attending: Orthopedic Surgery

## 2016-07-21 ENCOUNTER — Encounter (HOSPITAL_COMMUNITY): Payer: Self-pay | Admitting: Anesthesiology

## 2016-07-21 ENCOUNTER — Inpatient Hospital Stay (HOSPITAL_COMMUNITY)
Admission: RE | Admit: 2016-07-21 | Discharge: 2016-07-24 | DRG: 470 | Disposition: A | Discharge status: Skilled Nursing Facility | Payer: Medicare Other | Source: Ambulatory Visit | Attending: Orthopedic Surgery | Admitting: Orthopedic Surgery

## 2016-07-21 ENCOUNTER — Inpatient Hospital Stay (HOSPITAL_COMMUNITY): Payer: Medicare Other | Admitting: Anesthesiology

## 2016-07-21 DIAGNOSIS — I82491 Acute embolism and thrombosis of other specified deep vein of right lower extremity: Secondary | ICD-10-CM | POA: Diagnosis not present

## 2016-07-21 DIAGNOSIS — F419 Anxiety disorder, unspecified: Secondary | ICD-10-CM | POA: Diagnosis present

## 2016-07-21 DIAGNOSIS — Z9889 Other specified postprocedural states: Secondary | ICD-10-CM | POA: Diagnosis not present

## 2016-07-21 DIAGNOSIS — G4730 Sleep apnea, unspecified: Secondary | ICD-10-CM | POA: Diagnosis present

## 2016-07-21 DIAGNOSIS — M1711 Unilateral primary osteoarthritis, right knee: Secondary | ICD-10-CM

## 2016-07-21 DIAGNOSIS — Z7982 Long term (current) use of aspirin: Secondary | ICD-10-CM

## 2016-07-21 DIAGNOSIS — Z79899 Other long term (current) drug therapy: Secondary | ICD-10-CM | POA: Diagnosis not present

## 2016-07-21 DIAGNOSIS — M1611 Unilateral primary osteoarthritis, right hip: Secondary | ICD-10-CM

## 2016-07-21 DIAGNOSIS — M1610 Unilateral primary osteoarthritis, unspecified hip: Secondary | ICD-10-CM | POA: Diagnosis present

## 2016-07-21 DIAGNOSIS — Z87891 Personal history of nicotine dependence: Secondary | ICD-10-CM

## 2016-07-21 DIAGNOSIS — Z419 Encounter for procedure for purposes other than remedying health state, unspecified: Secondary | ICD-10-CM

## 2016-07-21 DIAGNOSIS — Z981 Arthrodesis status: Secondary | ICD-10-CM

## 2016-07-21 DIAGNOSIS — Z888 Allergy status to other drugs, medicaments and biological substances status: Secondary | ICD-10-CM

## 2016-07-21 DIAGNOSIS — I10 Essential (primary) hypertension: Secondary | ICD-10-CM | POA: Diagnosis present

## 2016-07-21 DIAGNOSIS — M25551 Pain in right hip: Secondary | ICD-10-CM | POA: Diagnosis present

## 2016-07-21 HISTORY — DX: Unilateral primary osteoarthritis, unspecified hip: M16.10

## 2016-07-21 HISTORY — PX: TOTAL HIP ARTHROPLASTY: SHX124

## 2016-07-21 SURGERY — ARTHROPLASTY, HIP, TOTAL, ANTERIOR APPROACH
Anesthesia: Monitor Anesthesia Care | Site: Hip | Laterality: Right

## 2016-07-21 MED ORDER — ONDANSETRON HCL 4 MG PO TABS: 4.0000 mg | ORAL_TABLET | Freq: Four times a day (QID) | ORAL | Status: DC | PRN

## 2016-07-21 MED ORDER — PROPOFOL 10 MG/ML IV BOLUS
INTRAVENOUS | Status: AC
  Filled 2016-07-21: qty 20

## 2016-07-21 MED ORDER — MORPHINE SULFATE (PF) 2 MG/ML IV SOLN: 2.0000 mg | INTRAVENOUS | Status: DC | PRN

## 2016-07-21 MED ORDER — FENTANYL CITRATE (PF) 100 MCG/2ML IJ SOLN
INTRAMUSCULAR | Status: AC
  Filled 2016-07-21: qty 2

## 2016-07-21 MED ORDER — ONDANSETRON HCL 4 MG/2ML IJ SOLN: 4.0000 mg | Freq: Four times a day (QID) | INTRAMUSCULAR | Status: DC | PRN

## 2016-07-21 MED ORDER — MENTHOL 3 MG MT LOZG: 1.0000 | LOZENGE | OROMUCOSAL | Status: DC | PRN

## 2016-07-21 MED ORDER — ONDANSETRON HCL 4 MG/2ML IJ SOLN
INTRAMUSCULAR | Status: DC | PRN
  Administered 2016-07-21: 4 mg via INTRAVENOUS

## 2016-07-21 MED ORDER — FENTANYL CITRATE (PF) 100 MCG/2ML IJ SOLN
INTRAMUSCULAR | Status: DC | PRN
  Administered 2016-07-21 (×4): 25 ug via INTRAVENOUS
  Administered 2016-07-21: 50 ug via INTRAVENOUS
  Administered 2016-07-21: 100 ug via INTRAVENOUS

## 2016-07-21 MED ORDER — COENZYME Q10 300 MG PO CAPS: 300.0000 mg | ORAL_CAPSULE | Freq: Every day | ORAL | Status: DC

## 2016-07-21 MED ORDER — SUGAMMADEX SODIUM 200 MG/2ML IV SOLN
INTRAVENOUS | Status: DC | PRN
  Administered 2016-07-21: 200 mg via INTRAVENOUS

## 2016-07-21 MED ORDER — 0.9 % SODIUM CHLORIDE (POUR BTL) OPTIME
TOPICAL | Status: DC | PRN
Start: 2016-07-21 — End: 2016-07-21
  Administered 2016-07-21 (×3): 1000 mL

## 2016-07-21 MED ORDER — TRANEXAMIC ACID 1000 MG/10ML IV SOLN
2000.0000 mg | Freq: Once | INTRAVENOUS | Status: AC
  Administered 2016-07-21: 2000 mg via TOPICAL
  Filled 2016-07-21: qty 20

## 2016-07-21 MED ORDER — ONDANSETRON HCL 4 MG/2ML IJ SOLN
INTRAMUSCULAR | Status: AC
  Filled 2016-07-21: qty 2

## 2016-07-21 MED ORDER — ROCURONIUM BROMIDE 10 MG/ML (PF) SYRINGE
PREFILLED_SYRINGE | INTRAVENOUS | Status: AC
  Filled 2016-07-21: qty 10

## 2016-07-21 MED ORDER — ATORVASTATIN CALCIUM 40 MG PO TABS
40.0000 mg | ORAL_TABLET | Freq: Every day | ORAL | Status: DC
  Administered 2016-07-21 – 2016-07-23 (×3): 40 mg via ORAL
  Filled 2016-07-21 (×3): qty 1

## 2016-07-21 MED ORDER — FLUTICASONE PROPIONATE 50 MCG/ACT NA SUSP
2.0000 | Freq: Every day | NASAL | Status: DC
  Administered 2016-07-21 – 2016-07-24 (×4): 2 via NASAL
  Filled 2016-07-21: qty 16

## 2016-07-21 MED ORDER — ONDANSETRON HCL 4 MG/2ML IJ SOLN
4.0000 mg | Freq: Once | INTRAMUSCULAR | Status: AC | PRN
  Administered 2016-07-21: 4 mg via INTRAVENOUS

## 2016-07-21 MED ORDER — DEXAMETHASONE SODIUM PHOSPHATE 10 MG/ML IJ SOLN
INTRAMUSCULAR | Status: AC
  Filled 2016-07-21: qty 1

## 2016-07-21 MED ORDER — HYDROCODONE-ACETAMINOPHEN 10-325 MG PO TABS
1.0000 | ORAL_TABLET | ORAL | Status: DC | PRN
  Administered 2016-07-21: 2 via ORAL
  Administered 2016-07-21: 1 via ORAL
  Administered 2016-07-22 – 2016-07-23 (×3): 2 via ORAL
  Filled 2016-07-21: qty 1
  Filled 2016-07-21 (×4): qty 2

## 2016-07-21 MED ORDER — MECLIZINE HCL 25 MG PO TABS
25.0000 mg | ORAL_TABLET | Freq: Three times a day (TID) | ORAL | Status: DC | PRN
  Filled 2016-07-21: qty 1

## 2016-07-21 MED ORDER — LACTATED RINGERS IV SOLN
INTRAVENOUS | Status: DC | PRN
  Administered 2016-07-21: 07:00:00 via INTRAVENOUS

## 2016-07-21 MED ORDER — METOCLOPRAMIDE HCL 5 MG PO TABS: 5.0000 mg | ORAL_TABLET | Freq: Three times a day (TID) | ORAL | Status: DC | PRN

## 2016-07-21 MED ORDER — MELOXICAM 7.5 MG PO TABS: 15.0000 mg | ORAL_TABLET | Freq: Every day | ORAL | Status: DC | PRN

## 2016-07-21 MED ORDER — ACETAMINOPHEN 650 MG RE SUPP: 650.0000 mg | Freq: Four times a day (QID) | RECTAL | Status: DC | PRN

## 2016-07-21 MED ORDER — DEXAMETHASONE SODIUM PHOSPHATE 4 MG/ML IJ SOLN
INTRAMUSCULAR | Status: DC | PRN
  Administered 2016-07-21: 10 mg via INTRAVENOUS

## 2016-07-21 MED ORDER — CEFAZOLIN SODIUM-DEXTROSE 2-4 GM/100ML-% IV SOLN
2.0000 g | INTRAVENOUS | Status: AC
  Administered 2016-07-21: 2 g via INTRAVENOUS
  Filled 2016-07-21: qty 100

## 2016-07-21 MED ORDER — ASPIRIN EC 325 MG PO TBEC
325.0000 mg | DELAYED_RELEASE_TABLET | Freq: Every day | ORAL | Status: DC
  Administered 2016-07-22 – 2016-07-24 (×3): 325 mg via ORAL
  Filled 2016-07-21 (×3): qty 1

## 2016-07-21 MED ORDER — PHENYLEPHRINE HCL 10 MG/ML IJ SOLN
INTRAVENOUS | Status: DC | PRN
  Administered 2016-07-21: 50 ug/min via INTRAVENOUS

## 2016-07-21 MED ORDER — PROPOFOL 10 MG/ML IV BOLUS
INTRAVENOUS | Status: DC | PRN
  Administered 2016-07-21: 170 mg via INTRAVENOUS

## 2016-07-21 MED ORDER — CHLORHEXIDINE GLUCONATE 4 % EX LIQD: 60.0000 mL | Freq: Once | CUTANEOUS | Status: DC

## 2016-07-21 MED ORDER — CEFAZOLIN SODIUM-DEXTROSE 2-4 GM/100ML-% IV SOLN
2.0000 g | Freq: Four times a day (QID) | INTRAVENOUS | Status: AC
  Administered 2016-07-21 (×2): 2 g via INTRAVENOUS
  Filled 2016-07-21 (×2): qty 100

## 2016-07-21 MED ORDER — FENTANYL CITRATE (PF) 100 MCG/2ML IJ SOLN
25.0000 ug | INTRAMUSCULAR | Status: DC | PRN
  Administered 2016-07-21: 25 ug via INTRAVENOUS
  Administered 2016-07-21: 50 ug via INTRAVENOUS
  Administered 2016-07-21: 25 ug via INTRAVENOUS

## 2016-07-21 MED ORDER — SUGAMMADEX SODIUM 200 MG/2ML IV SOLN
INTRAVENOUS | Status: AC
  Filled 2016-07-21: qty 2

## 2016-07-21 MED ORDER — PHENOL 1.4 % MT LIQD: 1.0000 | OROMUCOSAL | Status: DC | PRN

## 2016-07-21 MED ORDER — SODIUM CHLORIDE 0.9 % IR SOLN
Status: DC | PRN
  Administered 2016-07-21: 3000 mL

## 2016-07-21 MED ORDER — LIDOCAINE 2% (20 MG/ML) 5 ML SYRINGE
INTRAMUSCULAR | Status: AC
  Filled 2016-07-21: qty 5

## 2016-07-21 MED ORDER — METOCLOPRAMIDE HCL 5 MG/ML IJ SOLN: 5.0000 mg | Freq: Three times a day (TID) | INTRAMUSCULAR | Status: DC | PRN

## 2016-07-21 MED ORDER — ROCURONIUM BROMIDE 100 MG/10ML IV SOLN
INTRAVENOUS | Status: DC | PRN
  Administered 2016-07-21: 50 mg via INTRAVENOUS

## 2016-07-21 MED ORDER — FENTANYL CITRATE (PF) 100 MCG/2ML IJ SOLN
INTRAMUSCULAR | Status: AC
  Administered 2016-07-21: 50 ug via INTRAVENOUS
  Filled 2016-07-21: qty 2

## 2016-07-21 MED ORDER — LIDOCAINE HCL (CARDIAC) 20 MG/ML IV SOLN
INTRAVENOUS | Status: DC | PRN
  Administered 2016-07-21: 60 mg via INTRAVENOUS

## 2016-07-21 MED ORDER — LORATADINE 10 MG PO TABS
10.0000 mg | ORAL_TABLET | Freq: Every day | ORAL | Status: DC
  Administered 2016-07-21 – 2016-07-24 (×4): 10 mg via ORAL
  Filled 2016-07-21 (×4): qty 1

## 2016-07-21 MED ORDER — ACETAMINOPHEN 325 MG PO TABS: 650.0000 mg | ORAL_TABLET | Freq: Four times a day (QID) | ORAL | Status: DC | PRN

## 2016-07-21 MED ORDER — LISINOPRIL 10 MG PO TABS
10.0000 mg | ORAL_TABLET | Freq: Every day | ORAL | Status: DC
  Administered 2016-07-21 – 2016-07-24 (×4): 10 mg via ORAL
  Filled 2016-07-21 (×4): qty 1

## 2016-07-21 MED ORDER — POTASSIUM CHLORIDE IN NACL 20-0.9 MEQ/L-% IV SOLN
INTRAVENOUS | Status: AC
  Administered 2016-07-21: 15:00:00 via INTRAVENOUS
  Filled 2016-07-21 (×2): qty 1000

## 2016-07-21 SURGICAL SUPPLY — 53 items
APL SKNCLS STERI-STRIP NONHPOA (GAUZE/BANDAGES/DRESSINGS) ×1
BENZOIN TINCTURE PRP APPL 2/3 (GAUZE/BANDAGES/DRESSINGS) ×2 IMPLANT
BLADE SAW SGTL 18X1.27X75 (BLADE) ×2 IMPLANT
BLADE SURG ROTATE 9660 (MISCELLANEOUS) IMPLANT
CAPT HIP TOTAL 2 ×2 IMPLANT
CELLS DAT CNTRL 66122 CELL SVR (MISCELLANEOUS) ×1 IMPLANT
CLSR STERI-STRIP ANTIMIC 1/2X4 (GAUZE/BANDAGES/DRESSINGS) ×2 IMPLANT
COVER SURGICAL LIGHT HANDLE (MISCELLANEOUS) ×2 IMPLANT
DRAPE C-ARM 42X72 X-RAY (DRAPES) ×2 IMPLANT
DRAPE STERI IOBAN 125X83 (DRAPES) ×2 IMPLANT
DRAPE U-SHAPE 47X51 STRL (DRAPES) ×6 IMPLANT
DRSG AQUACEL AG ADV 3.5X10 (GAUZE/BANDAGES/DRESSINGS) ×2 IMPLANT
DURAPREP 26ML APPLICATOR (WOUND CARE) ×2 IMPLANT
ELECT BLADE 4.0 EZ CLEAN MEGAD (MISCELLANEOUS) ×2
ELECT BLADE 6.5 EXT (BLADE) IMPLANT
ELECT REM PT RETURN 9FT ADLT (ELECTROSURGICAL) ×2
ELECTRODE BLDE 4.0 EZ CLN MEGD (MISCELLANEOUS) ×1 IMPLANT
ELECTRODE REM PT RTRN 9FT ADLT (ELECTROSURGICAL) ×1 IMPLANT
FACESHIELD WRAPAROUND (MASK) ×4 IMPLANT
GLOVE BIOGEL PI IND STRL 8 (GLOVE) ×2 IMPLANT
GLOVE BIOGEL PI INDICATOR 8 (GLOVE) ×2
GLOVE ECLIPSE 8.0 STRL XLNG CF (GLOVE) ×2 IMPLANT
GLOVE ORTHO TXT STRL SZ7.5 (GLOVE) ×4 IMPLANT
GOWN STRL REUS W/ TWL LRG LVL3 (GOWN DISPOSABLE) ×2 IMPLANT
GOWN STRL REUS W/ TWL XL LVL3 (GOWN DISPOSABLE) ×2 IMPLANT
GOWN STRL REUS W/TWL LRG LVL3 (GOWN DISPOSABLE) ×2
GOWN STRL REUS W/TWL XL LVL3 (GOWN DISPOSABLE) ×4
HANDPIECE INTERPULSE COAX TIP (DISPOSABLE) ×2
KIT BASIN OR (CUSTOM PROCEDURE TRAY) ×2 IMPLANT
KIT ROOM TURNOVER OR (KITS) ×2 IMPLANT
MANIFOLD NEPTUNE II (INSTRUMENTS) ×2 IMPLANT
NS IRRIG 1000ML POUR BTL (IV SOLUTION) ×6 IMPLANT
PACK TOTAL JOINT (CUSTOM PROCEDURE TRAY) ×2 IMPLANT
PAD ARMBOARD 7.5X6 YLW CONV (MISCELLANEOUS) ×2 IMPLANT
RTRCTR WOUND ALEXIS 18CM MED (MISCELLANEOUS) ×2
SET HNDPC FAN SPRY TIP SCT (DISPOSABLE) ×1 IMPLANT
SPONGE LAP 18X18 X RAY DECT (DISPOSABLE) ×2 IMPLANT
STAPLER VISISTAT 35W (STAPLE) IMPLANT
STRIP CLOSURE SKIN 1/2X4 (GAUZE/BANDAGES/DRESSINGS) ×2 IMPLANT
SUT ETHIBOND NAB CT1 #1 30IN (SUTURE) ×2 IMPLANT
SUT MNCRL AB 3-0 PS2 18 (SUTURE) ×2 IMPLANT
SUT MNCRL AB 4-0 PS2 18 (SUTURE) IMPLANT
SUT VIC AB 0 CT1 27 (SUTURE) ×6
SUT VIC AB 0 CT1 27XBRD ANBCTR (SUTURE) ×3 IMPLANT
SUT VIC AB 1 CT1 27 (SUTURE) ×2
SUT VIC AB 1 CT1 27XBRD ANBCTR (SUTURE) ×1 IMPLANT
SUT VIC AB 2-0 CT1 27 (SUTURE) ×6
SUT VIC AB 2-0 CT1 TAPERPNT 27 (SUTURE) ×3 IMPLANT
TOWEL OR 17X24 6PK STRL BLUE (TOWEL DISPOSABLE) IMPLANT
TOWEL OR 17X26 10 PK STRL BLUE (TOWEL DISPOSABLE) ×2 IMPLANT
TRAY CATH 16FR W/PLASTIC CATH (SET/KITS/TRAYS/PACK) ×2 IMPLANT
TRAY FOLEY CATH 16FRSI W/METER (SET/KITS/TRAYS/PACK) IMPLANT
WATER STERILE IRR 1000ML POUR (IV SOLUTION) IMPLANT

## 2016-07-21 NOTE — Anesthesia Preprocedure Evaluation (Addendum)
Anesthesia Evaluation  Patient identified by MRN, date of birth, ID band Patient awake    Reviewed: Allergy & Precautions, NPO status , Patient's Chart, lab work & pertinent test results  Airway Mallampati: II  TM Distance: >3 FB Neck ROM: Full    Dental  (+) Teeth Intact, Dental Advisory Given   Pulmonary former smoker,    breath sounds clear to auscultation       Cardiovascular hypertension,  Rhythm:Regular Rate:Normal     Neuro/Psych    GI/Hepatic   Endo/Other    Renal/GU      Musculoskeletal   Abdominal   Peds  Hematology   Anesthesia Other Findings   Reproductive/Obstetrics                           Anesthesia Physical Anesthesia Plan  ASA: III  Anesthesia Plan: MAC and Spinal   Post-op Pain Management:    Induction:   Airway Management Planned: Natural Airway and Simple Face Mask  Additional Equipment:   Intra-op Plan:   Post-operative Plan:   Informed Consent: I have reviewed the patients History and Physical, chart, labs and discussed the procedure including the risks, benefits and alternatives for the proposed anesthesia with the patient or authorized representative who has indicated his/her understanding and acceptance.   Dental advisory given  Plan Discussed with: CRNA and Anesthesiologist  Anesthesia Plan Comments:         Anesthesia Quick Evaluation

## 2016-07-21 NOTE — Plan of Care (Signed)
Problem: Acute Rehab PT Goals(only PT should resolve) Goal: Pt Will Go Supine/Side To Sit Maintaining back precautions Goal: Pt Will Ambulate With WBing as ordered by MD

## 2016-07-21 NOTE — Anesthesia Postprocedure Evaluation (Signed)
Anesthesia Post Note  Patient: [name]  Procedure(s) Performed: Procedure(s) (LRB): TOTAL HIP ARTHROPLASTY ANTERIOR APPROACH (Right)  Patient location during evaluation: PACU Level of consciousness: awake, awake and alert and oriented Pain management: pain level controlled Vital Signs Assessment: post-procedure vital signs reviewed and stable Respiratory status: spontaneous breathing, nonlabored ventilation and respiratory function stable Cardiovascular status: blood pressure returned to baseline    Last Vitals:  Vitals:   07/21/16 1328 07/21/16 1351  BP: 140/73 132/60  Pulse:  67  Resp:    Temp:  36.6 C    Last Pain:  Vitals:   07/21/16 1351  TempSrc: Axillary  PainSc:                  Janete Quilling COKER

## 2016-07-21 NOTE — Anesthesia Procedure Notes (Signed)
Procedure Name: Intubation Performed by: Terrance Mass Pre-anesthesia Checklist: Patient identified, Emergency Drugs available, Suction available and Patient being monitored Patient Re-evaluated:Patient Re-evaluated prior to inductionOxygen Delivery Method: Circle system utilized Preoxygenation: Pre-oxygenation with 100% oxygen Intubation Type: IV induction Ventilation: Mask ventilation without difficulty Laryngoscope Size: Miller and 2 Grade View: Grade I Tube type: Oral Tube size: 7.5 mm Number of attempts: 1 Airway Equipment and Method: Stylet and Oral airway Placement Confirmation: ETT inserted through vocal cords under direct vision,  positive ETCO2 and breath sounds checked- equal and bilateral Secured at: 23 cm Tube secured with: Tape Dental Injury: Teeth and Oropharynx as per pre-operative assessment

## 2016-07-21 NOTE — Evaluation (Signed)
Physical Therapy Evaluation Patient Details Name: [name] MRN: [id number] DOB: 1930/09/09 Today's Date: 07/21/2016   History of Present Illness  82 yo male with new R THA with direct anterior approach after failed OA management.  PMHx:  spinal stenosis lumbar area, HLD, anxiety, HOH, foraminal decompression R lumbar spine  Clinical Impression  Pt is seen POD 0 with good tolerance for initial movement, but will need to be given a WBing status to progress to gati tomorrow.  Pt is looking forward to rehab environment to continue therapy after the hospital and will continue working acutely to prepare for this with exercises and gait as tolerated within his limitations as surgeon determines.      Follow Up Recommendations SNF    Equipment Recommendations  None recommended by PT    Recommendations for Other Services       Precautions / Restrictions Precautions Precautions: Back;Fall (ck O2 sats) Precaution Booklet Issued: No Restrictions Weight Bearing Restrictions:  (WBing status not charted yet)      Mobility  Bed Mobility Overal bed mobility: Needs Assistance Bed Mobility: Supine to Sit;Sit to Supine     Supine to sit: Mod assist Sit to supine: Max assist   General bed mobility comments: could slide legs off the bed coming out but needed more help returning to the bed  Transfers                 General transfer comment: sat side of bed only  Ambulation/Gait             General Gait Details: not able to walk yet  Stairs            Wheelchair Mobility    Modified Rankin (Stroke Patients Only)       Balance Overall balance assessment: Needs assistance Sitting-balance support: Feet supported Sitting balance-Leahy Scale: Fair                                       Pertinent Vitals/Pain Pain Assessment: 0-10 Pain Score: 4  Pain Location: R hip Pain Descriptors / Indicators: Operative site guarding Pain  Intervention(s): Limited activity within patient's tolerance;Monitored during session;Premedicated before session;Repositioned;Ice applied    Home Living Family/patient expects to be discharged to:: Skilled nursing facility Living Arrangements: Spouse/significant other                    Prior Function Level of Independence: Independent with assistive device(s)         Comments: RW with mod I     Hand Dominance        Extremity/Trunk Assessment   Upper Extremity Assessment: Overall WFL for tasks assessed           Lower Extremity Assessment: Generalized weakness;RLE deficits/detail RLE Deficits / Details: weakness R hip due to surgery and some discomfort    Cervical / Trunk Assessment: Normal (recent spinal surgery)  Communication   Communication: No difficulties  Cognition Arousal/Alertness: Lethargic Behavior During Therapy: WFL for tasks assessed/performed Overall Cognitive Status: Within Functional Limits for tasks assessed                      General Comments General comments (skin integrity, edema, etc.): Pt is lethargic post op and wife asleep on side of couch due to early AM with surgery.  Pt is expecting to have rehab in their home environment  of ALF and SNF    Exercises     Assessment/Plan    PT Assessment Patient needs continued PT services  PT Problem List Decreased strength;Decreased activity tolerance;Decreased range of motion;Decreased balance;Decreased mobility;Decreased coordination;Decreased knowledge of use of DME;Decreased safety awareness;Cardiopulmonary status limiting activity;Decreased skin integrity;Pain          PT Treatment Interventions DME instruction;Gait training;Functional mobility training;Therapeutic activities;Therapeutic exercise;Balance training;Neuromuscular re-education;Patient/family education    PT Goals (Current goals can be found in the Care Plan section)  Acute Rehab PT Goals Patient Stated Goal: to  get up to walk again and get home to rehab in SNF PT Goal Formulation: With patient/family Time For Goal Achievement: 08/04/16 Potential to Achieve Goals: Good    Frequency 7X/week   Barriers to discharge Other (comment) (has SNF level of care available)      Co-evaluation               End of Session Equipment Utilized During Treatment: Oxygen Activity Tolerance: Patient tolerated treatment well;Patient limited by fatigue;Treatment limited secondary to medical complications (Comment) (anesthesia) Patient left: in bed;with call bell/phone within reach;with bed alarm set;with family/visitor present Nurse Communication: Mobility status;Weight bearing status (nursing to call and PT left MD message about WBing status)         Time: [id number] PT Time Calculation (min) (ACUTE ONLY): 29 min   Charges:   PT Evaluation $PT Eval Moderate Complexity: 1 Procedure PT Treatments $Therapeutic Activity: 8-22 mins   PT G CodesRamond Dial 22-Jul-2016, 6:40 PM    Mee Hives, PT MS Acute Rehab Dept. Number: [place] [facility] [place]

## 2016-07-21 NOTE — H&P (Signed)
TOTAL HIP ADMISSION H&P  Patient is admitted for right total hip arthroplasty.  Subjective:  Chief Complaint: right hip pain  HPI: [name], 82 y.o. male, has a history of pain and functional disability in the right hip(s) due to arthritis and patient has failed non-surgical conservative treatments for greater than 12 weeks to include NSAID's and/or analgesics, flexibility and strengthening excercises, use of assistive devices and activity modification.  Onset of symptoms was gradual starting 6 years ago with gradually worsening course since that time.The patient noted no past surgery on the right hip(s).  Patient currently rates pain in the right hip at 9 out of 10 with activity. Patient has night pain, worsening of pain with activity and weight bearing, trendelenberg gait, pain that interfers with activities of daily living, pain with passive range of motion and crepitus. Patient has evidence of subchondral cysts, subchondral sclerosis, periarticular osteophytes and joint space narrowing by imaging studies. This condition presents safety issues increasing the risk of falls. This patient has had Recent lumbar spine surgery for foraminal decompression on the right side in the mid lumbar level and this has not helped this patient's hip pain.  There is no current active infection.  Patient Active Problem List   Diagnosis Date Noted  . Spinal stenosis of lumbar region 05/28/2016  . Hypercholesteremia    Past Medical History:  Diagnosis Date  . Anxiety   . Arthritis   . Complication of anesthesia    states "I was in [place] for several weeks after surgery"caused by tramadol  . Hard of hearing   . Hypercholesteremia   . Hypertension   . Sleep apnea    cpap  . Urinary frequency   . Vertigo   . Wears glasses     Past Surgical History:  Procedure Laterality Date  . BACK SURGERY  10/17, 80's  . COLONOSCOPY    . ESOPHAGOGASTRODUODENOSCOPY    . LUMBAR LAMINECTOMY/DECOMPRESSION  MICRODISCECTOMY Right 05/28/2016   Procedure: Right Lumbar Three-Four Microdiscectomy;  Surgeon: [name], MD;  Location: [facility];  Service: Neurosurgery;  Laterality: Right;    Prescriptions Prior to Admission  Medication Sig Dispense Refill Last Dose  . aspirin EC 81 MG tablet Take 81 mg by mouth daily.   07/20/2016 at Unknown time  . atorvastatin (LIPITOR) 40 MG tablet Take 40 mg by mouth daily at 12 noon.    07/20/2016 at Unknown time  . cetirizine (ZYRTEC) 10 MG tablet Take 10 mg by mouth daily as needed for allergies.   07/20/2016 at Unknown time  . Coenzyme Q10 (EQL COQ10) 300 MG CAPS Take 300 mg by mouth daily.   07/20/2016 at Unknown time  . fluticasone (FLONASE) 50 MCG/ACT nasal spray Place 2 sprays into both nostrils daily.   07/20/2016 at Unknown time  . Gluc-Chonn-MSM-Boswellia-Vit D (GLUCOSAMINE CHOND TRIPLE/VIT D PO) Take 2 tablets by mouth daily.   07/20/2016 at Unknown time  . HYDROcodone-acetaminophen (NORCO/VICODIN) 5-325 MG tablet Take 1 tablet by mouth every 6 (six) hours as needed for moderate pain. (Patient taking differently: Take 1 tablet by mouth 3 (three) times daily as needed for moderate pain. ) 40 tablet 0 07/20/2016 at Unknown time  . lisinopril (PRINIVIL,ZESTRIL) 10 MG tablet Take 10 mg by mouth daily.   07/20/2016 at Unknown time  . meloxicam (MOBIC) 15 MG tablet Take 15 mg by mouth daily as needed (for pain.).    07/20/2016 at Unknown time  . meclizine (ANTIVERT) 25 MG tablet Take 25  mg by mouth 3 (three) times daily as needed (for dizziness). Travel Sickness Tablets   More than a month at Unknown time  . sodium chloride (OCEAN) 0.65 % SOLN nasal spray Place 2 sprays into both nostrils as needed for congestion.   More than a month at Unknown time   Allergies  Allergen Reactions  . Tramadol Other (See Comments)    "does not eat" LOSS OF APPETITE    Social History  Substance Use Topics  . Smoking status: Former Smoker    Packs/day: 1.00    Years: 4.00     Quit date: 07/16/1968  . Smokeless tobacco: Never Used     Comment: quit 50 years ago  . Alcohol use No    History reviewed. No pertinent family history.   Review of Systems  Constitutional: Negative.   HENT: Negative.   Eyes: Negative.   Respiratory: Negative.   Cardiovascular: Negative.   Gastrointestinal: Negative.   Genitourinary: Negative.   Musculoskeletal: Positive for joint pain.  Skin: Negative.   Neurological: Negative.   Endo/Heme/Allergies: Negative.   Psychiatric/Behavioral: Negative.     Objective:  Physical Exam  Constitutional: He appears well-developed.  HENT:  Head: Normocephalic.  Eyes: Pupils are equal, round, and reactive to light.  Neck: Normal range of motion.  Cardiovascular: Normal rate.   Respiratory: Effort normal.  Neurological: He is alert.  Skin: Skin is warm.  Psychiatric: He has a normal mood and affect.  Examination of the right hip demonstrates slightly shorter leg on the right versus left intact ankle dorsi flexion plantar flexion palpable pedal pulses significant restriction of internal rotation on the right compared to the left with pain.  Little bit of weakness with hip flexion on the right versus left.  No other paresthesias below the knee region on the right versus left  Vital signs in last 24 hours: Temp:  [97.9 F (36.6 C)] 97.9 F (36.6 C) (11/13 0636) Pulse Rate:  [64] 64 (11/13 0636) Resp:  [20] 20 (11/13 0636) BP: (122)/(61) 122/61 (11/13 0636) SpO2:  [99 %] 99 % (11/13 0636) Weight:  [197 lb (89.4 kg)] 197 lb (89.4 kg) (11/13 0636)  Labs:   Estimated body mass index is 28.27 kg/m as calculated from the following:   Height as of this encounter: 5\' 10"  (1.778 m).   Weight as of this encounter: 197 lb (89.4 kg).   Imaging Review Plain radiographs demonstrate severe degenerative joint disease of the right hip(s). The bone quality appears to be good for age and reported activity level.  Assessment/Plan:  End  stage arthritis, right hip(s)  The patient history, physical examination, clinical judgement of the provider and imaging studies are consistent with end stage degenerative joint disease of the right hip(s) and total hip arthroplasty is deemed medically necessary. The treatment options including medical management, injection therapy, arthroscopy and arthroplasty were discussed at length. The risks and benefits of total hip arthroplasty were presented and reviewed. The risks due to aseptic loosening, infection, stiffness, dislocation/subluxation,  thromboembolic complications and other imponderables were discussed.  The patient acknowledged the explanation, agreed to proceed with the plan and consent was signed. Patient is being admitted for inpatient treatment for surgery, pain control, PT, OT, prophylactic antibiotics, VTE prophylaxis, progressive ambulation and ADL's and discharge planning.The patient is planning to be discharged to skilled nursing facility

## 2016-07-21 NOTE — Transfer of Care (Signed)
Immediate Anesthesia Transfer of Care Note  Patient: [name]  Procedure(s) Performed: Procedure(s): TOTAL HIP ARTHROPLASTY ANTERIOR APPROACH (Right)  Patient Location: PACU  Anesthesia Type:General  Level of Consciousness: awake and sedated  Airway & Oxygen Therapy: Patient Spontanous Breathing and Patient connected to face mask oxygen  Post-op Assessment: Report given to RN and Post -op Vital signs reviewed and stable  Post vital signs: Reviewed and stable  Last Vitals:  Vitals:   07/21/16 0636 07/21/16 1158  BP: 122/61   Pulse: 64   Resp: 20   Temp: 36.6 C (P) 36.5 C    Last Pain:  Vitals:   07/21/16 0636  TempSrc: Oral      Patients Stated Pain Goal: (P) 0 ([id number])  Complications: No apparent anesthesia complications

## 2016-07-21 NOTE — Brief Op Note (Signed)
07/21/2016  12:04 PM  PATIENT:  [name]  82 y.o. male  PRE-OPERATIVE DIAGNOSIS:  RIGHT HIP OSTEOARTHRITIS  POST-OPERATIVE DIAGNOSIS:  RIGHT HIP OSTEOARTHRITIS  PROCEDURE:  Procedure(s): TOTAL HIP ARTHROPLASTY ANTERIOR APPROACH  SURGEON:  Surgeon(s): [name], MD  ASSISTANT: Laure Kidney RNFA  ANESTHESIA:   general  EBL: 350 ml    Total I/O In: 1800 [I.V.:1800] Out: 950 [Urine:600; Blood:350]  BLOOD ADMINISTERED: none  DRAINS: none   LOCAL MEDICATIONS USED:  none  SPECIMEN:  No Specimen  COUNTS:  YES  TOURNIQUET:  * No tourniquets in log *  DICTATION: .Other Dictation: Dictation Number [id number]  PLAN OF CARE: Admit to inpatient   PATIENT DISPOSITION:  PACU - hemodynamically stable

## 2016-07-22 ENCOUNTER — Encounter (HOSPITAL_COMMUNITY): Payer: Self-pay | Admitting: Orthopedic Surgery

## 2016-07-22 LAB — CBC WITH DIFFERENTIAL/PLATELET
BASOS ABS: 0 10*3/uL (ref 0.0–0.1)
BASOS PCT: 0 %
EOS PCT: 0 %
Eosinophils Absolute: 0 10*3/uL (ref 0.0–0.7)
HEMATOCRIT: 33.3 % — AB (ref 39.0–52.0)
Hemoglobin: 10.8 g/dL — ABNORMAL LOW (ref 13.0–17.0)
Lymphocytes Relative: 13 %
Lymphs Abs: 1.1 10*3/uL (ref 0.7–4.0)
MCH: 29.3 pg (ref 26.0–34.0)
MCHC: 32.4 g/dL (ref 30.0–36.0)
MCV: 90.5 fL (ref 78.0–100.0)
MONO ABS: 0.7 10*3/uL (ref 0.1–1.0)
Monocytes Relative: 8 %
NEUTROS ABS: 6.2 10*3/uL (ref 1.7–7.7)
Neutrophils Relative %: 79 %
PLATELETS: 168 10*3/uL (ref 150–400)
RBC: 3.68 MIL/uL — ABNORMAL LOW (ref 4.22–5.81)
RDW: 14.6 % (ref 11.5–15.5)
WBC: 8 10*3/uL (ref 4.0–10.5)

## 2016-07-22 LAB — BASIC METABOLIC PANEL
ANION GAP: 8 (ref 5–15)
BUN: 12 mg/dL (ref 6–20)
CALCIUM: 8.7 mg/dL — AB (ref 8.9–10.3)
CO2: 22 mmol/L (ref 22–32)
Chloride: 108 mmol/L (ref 101–111)
Creatinine, Ser: 0.72 mg/dL (ref 0.61–1.24)
Glucose, Bld: 105 mg/dL — ABNORMAL HIGH (ref 65–99)
Potassium: 4.6 mmol/L (ref 3.5–5.1)
Sodium: 138 mmol/L (ref 135–145)

## 2016-07-22 NOTE — Clinical Social Work Note (Signed)
Clinical Social Work Assessment  Patient Details  Name: [name] MRN: [id number] Date of Birth: [birth year]  Date of referral:  07/22/16               Reason for consult:  Facility Placement (Patient states that he is from [facility])                Permission sought to share information with:   (Facilities) Permission granted to share information::   (Facilities)  Name::        Agency::     Relationship::     Contact Information:     Housing/Transportation Living arrangements for the past 2 months:   (Patient states that he has been living at [facility] since 2006) Source of Information:  Patient Patient Interpreter Needed:  None Criminal Activity/Legal Involvement Pertinent to Current Situation/Hospitalization:  No - Comment as needed Significant Relationships:  Spouse ([name]/ Wife [phone]) Lives with:  Facility Resident Do you feel safe going back to the place where you live?  Yes Need for family participation in patient care:  Yes (Comment)  Care giving concerns:  Patient needs assistance with ADLs. Patient states that he has a hip replacement yesterday.    Social Worker assessment / plan:  SW met with patient at bedside. Patient was alert and oriented. There was no family present. Patient informed SW that he is from [facility]. Patient states that he has been a resident at the facility since 2006. Patient states that he would like to return to the facility and that they have a SNF unit. SW reached out to SW at the facility. However, Network engineer states that their SW is currently on lunch. SW will follow up to ensure that it is okay for the pt to return to the facility.   Employment status:  Retired Forensic scientist:   Education officer, environmental) PT Recommendations:  [place] / Referral to community resources:  [place]  Patient/Family's Response to care:  Appropriate.   Patient/Family's  Understanding of and Emotional Response to Diagnosis, Current Treatment, and Prognosis:  Patient has no questions.   Emotional Assessment Appearance:  Appears stated age Attitude/Demeanor/Rapport:   (Appropriate. ) Affect (typically observed):  Accepting Orientation:  Oriented to Self, Oriented to Place, Oriented to  Time, Oriented to Situation Alcohol / Substance use:  Not Applicable Psych involvement (Current and /or in the community):  No (Comment)  Discharge Needs  Concerns to be addressed:  No discharge needs identified Readmission within the last 30 days:  No Current discharge risk:  None Barriers to Discharge:  No Barriers Identified   Bernita Buffy 07/22/2016, 12:39 PM

## 2016-07-22 NOTE — Progress Notes (Signed)
Subjective: Pt stable - pain ok   Objective: Vital signs in last 24 hours: Temp:  [97.2 F (36.2 C)-97.8 F (36.6 C)] 97.8 F (36.6 C) (11/14 0707) Pulse Rate:  [67-73] 67 (11/14 0707) Resp:  [12-25] 16 (11/13 1943) BP: (104-141)/(55-73) 104/57 (11/14 0707) SpO2:  [80 %-100 %] 96 % (11/14 0707)  Intake/Output from previous day: 11/13 0701 - 11/14 0700 In: 1800 [I.V.:1800] Out: 1425 [Urine:1075; Blood:350] Intake/Output this shift: No intake/output data recorded.  Exam:  Dorsiflexion/Plantar flexion intact  Labs: No results for input(s): HGB in the last 72 hours. No results for input(s): WBC, RBC, HCT, PLT in the last 72 hours. No results for input(s): NA, K, CL, CO2, BUN, CREATININE, GLUCOSE, CALCIUM in the last 72 hours. No results for input(s): LABPT, INR in the last 72 hours.  Assessment/Plan: Check hgb - PT today   Anderson Malta 07/22/2016, 7:21 AM

## 2016-07-22 NOTE — NC FL2 (Signed)
[place] LEVEL OF CARE SCREENING TOOL     IDENTIFICATION  Patient Name: [name] Birthdate: 1931-03-20 Sex: male Admission Date (Current Location): 07/21/2016  [facility] and [place] Number:  Herbalist and Address:  [facility] [place], [address] [place], [place] [address]      Provider Number: [id number]  Attending Physician Name and Address:  [name], MD  Relative Name and Phone Number:       Current Level of Care: Hospital Recommended Level of Care: [place] Prior Approval Number:    Date Approved/Denied:   PASRR Number:  ([id number])  Discharge Plan: SNF    Current Diagnoses: Patient Active Problem List   Diagnosis Date Noted  . Hip arthritis 07/21/2016  . Spinal stenosis of lumbar region 05/28/2016  . Hypercholesteremia     Orientation RESPIRATION BLADDER Height & Weight     Self, Time, Situation, Place  Normal Continent Weight: 197 lb (89.4 kg) Height:  5\' 10"  (177.8 cm)  BEHAVIORAL SYMPTOMS/MOOD NEUROLOGICAL BOWEL NUTRITION STATUS      Continent  (Normal)  AMBULATORY STATUS COMMUNICATION OF NEEDS Skin   Limited Assist Verbally Normal                       Personal Care Assistance Level of Assistance  Bathing, Dressing Bathing Assistance: Limited assistance   Dressing Assistance: Limited assistance     Functional Limitations Info             SPECIAL CARE FACTORS FREQUENCY                       Contractures      Additional Factors Info   (Full)               Current Medications (07/22/2016):  This is the current hospital active medication list Current Facility-Administered Medications  Medication Dose Route Frequency Provider Last Rate Last Dose  . 0.9 % NaCl with KCl 20 mEq/ L  infusion   Intravenous Continuous [name], MD 75 mL/hr at 07/21/16 1525    . acetaminophen (TYLENOL) tablet 650 mg  650 mg Oral Q6H PRN [name], MD       Or  . acetaminophen (TYLENOL) suppository 650 mg  650 mg Rectal Q6H PRN [name], MD      . aspirin EC tablet 325 mg  325 mg Oral Q breakfast [name], MD   325 mg at 07/22/16 0839  . atorvastatin (LIPITOR) tablet 40 mg  40 mg Oral Q1200 [name], MD   40 mg at 07/22/16 1131  . fluticasone (FLONASE) 50 MCG/ACT nasal spray 2 spray  2 spray Each Nare Daily [name], MD   2 spray at 07/22/16 [phone]  . HYDROcodone-acetaminophen (NORCO) 10-325 MG per tablet 1-2 tablet  1-2 tablet Oral Q4H PRN [name], MD   2 tablet at 07/22/16 0840  . lisinopril (PRINIVIL,ZESTRIL) tablet 10 mg  10 mg Oral Daily [name], MD   10 mg at 07/22/16 [phone]  . loratadine (CLARITIN) tablet 10 mg  10 mg Oral Daily [name], MD   10 mg at 07/22/16 0840  . meclizine (ANTIVERT) tablet 25 mg  25 mg Oral TID PRN [name], MD      . meloxicam [facility]) tablet 15 mg  15 mg Oral Daily PRN [name],  MD      . menthol-cetylpyridinium (CEPACOL) lozenge 3 mg  1 lozenge Oral PRN [name], MD       Or  . phenol (CHLORASEPTIC) mouth spray 1 spray  1 spray Mouth/Throat PRN [name], MD      . metoCLOPramide (REGLAN) tablet 5-10 mg  5-10 mg Oral Q8H PRN [name], MD       Or  . metoCLOPramide (REGLAN) injection 5-10 mg  5-10 mg Intravenous Q8H PRN [name], MD      . morphine 2 MG/ML injection 2 mg  2 mg Intravenous Q2H PRN [name], MD      . ondansetron [facility]) tablet 4 mg  4 mg Oral Q6H PRN [name], MD       Or  . ondansetron [facility]) injection 4 mg  4 mg Intravenous Q6H PRN [name], MD         Discharge Medications: Please see discharge summary for a list of discharge medications.  Relevant Imaging Results:  Relevant Lab Results:   Additional Information  (SS: [id number])  Venetia Maxon, Lynnda Child

## 2016-07-22 NOTE — Progress Notes (Signed)
CPAP set up for patient at bedside. Patient has home mask and will place himself on when ready. RT available if any assistance is needed.

## 2016-07-22 NOTE — Evaluation (Signed)
Occupational Therapy Evaluation Patient Details Name: [name] MRN: [id number] DOB: 01-May-1931 Today's Date: 07/22/2016    History of Present Illness 82 yo male with new R THA with direct anterior approach after failed OA management.  PMHx:  spinal stenosis lumbar area, HLD, anxiety, HOH, foraminal decompression R lumbar spine   Clinical Impression   Pt still with some confusion post surgery but oriented to place and time, needed cueing to remember that he was here for hip surgery and not due to recent back surgery.  Min assist for functional transfers with bladder urgency noted when attempting transfer to the toilet and not making it.  Will benefit from acute care OT for increased independence for selfcare tasks.  Feel he may need short term SNF rehab depending on the amount of assist his wife can provide.     Follow Up Recommendations  SNF    Equipment Recommendations  Other (comment) (TBD)       Precautions / Restrictions Precautions Precautions: Back;Fall Precaution Booklet Issued: No Restrictions Weight Bearing Restrictions: No RLE Weight Bearing: Weight bearing as tolerated      Mobility Bed Mobility Overal bed mobility: Needs Assistance Bed Mobility: Supine to Sit     Supine to sit: Min assist     General bed mobility comments: min a for safety to bring RLE OOB.   Transfers Overall transfer level: Needs assistance Equipment used: Rolling walker (2 wheeled) Transfers: Sit to/from [facility] Sit to Stand: Min assist Stand pivot transfers: Min assist       General transfer comment: Min assist for technique and hand placement.    Balance Overall balance assessment: Needs assistance Sitting-balance support: Feet supported;Single extremity supported Sitting balance-Leahy Scale: Fair Sitting balance - Comments: sitting EOB no back support   Standing balance support: Bilateral upper extremity supported Standing balance-Leahy Scale:  Poor Standing balance comment: Pt needs use of UEs for balance on the RW.                              ADL Overall ADL's : Needs assistance/impaired Eating/Feeding: Independent;Sitting   Grooming: Wash/dry hands;Standing;Minimal assistance   Upper Body Bathing: Supervision/ safety;Sitting   Lower Body Bathing: Moderate assistance;Sit to/from stand;Adhering to back precautions   Upper Body Dressing : Supervision/safety;Sitting   Lower Body Dressing: Maximal assistance;Adhering to back precautions;Sit to/from stand Lower Body Dressing Details (indicate cue type and reason): Pt reports having a sockaide, different from what we have, and also reachers Toilet Transfer: Minimal assistance;BSC;Ambulation;RW Toilet Transfer Details (indicate cue type and reason): Pt with bladder urgency and accident before reaching the toilet.   Toileting- Clothing Manipulation and Hygiene: Supervision/safety       Functional mobility during ADLs: Minimal assistance;Rolling walker General ADL Comments: Pt confused when asked about why he was here, he stated back surgery, even though that was completed in sept 2017.  He did eventually state understanding of hip surgery.  Oriented to place and month however.  May need SNF for follow-up depending on the amount of assist family can provide.       Vision Vision Assessment?: No apparent visual deficits   Perception Perception Perception Tested?: No   Praxis Praxis Praxis tested?: Not tested    Pertinent Vitals/Pain Pain Assessment: Faces Pain Score: 5  Faces Pain Scale: Hurts little more Pain Location: right hip Pain Descriptors / Indicators: Discomfort Pain Intervention(s): Limited activity within patient's tolerance  Hand Dominance Right   Extremity/Trunk Assessment Upper Extremity Assessment Upper Extremity Assessment: Overall WFL for tasks assessed   Lower Extremity Assessment Lower Extremity Assessment: Defer to PT  evaluation   Cervical / Trunk Assessment Cervical / Trunk Assessment: Normal   Communication Communication Communication: No difficulties   Cognition Arousal/Alertness: Awake/alert Behavior During Therapy: WFL for tasks assessed/performed Overall Cognitive Status: Within Functional Limits for tasks assessed                                Home Living Family/patient expects to be discharged to:: Skilled nursing facility Living Arrangements: Spouse/significant other Available Help at Discharge: Family (Pt lives with spouse but unsure how much assist she can provide) Type of Home: Independent living facility Home Access: Level entry     Home Layout: One level     Bathroom Shower/Tub: Occupational psychologist: Handicapped height     Home Equipment: Environmental consultant - 4 wheels;Wheelchair - power;Shower seat;Grab bars - tub/shower;Grab bars - toilet;Toilet riser          Prior Functioning/Environment Level of Independence: Independent with assistive device(s)        Comments: RW with mod I        OT Problem List: Decreased strength;Impaired balance (sitting and/or standing);Decreased knowledge of use of DME or AE;Decreased cognition;Decreased safety awareness   OT Treatment/Interventions: Self-care/ADL training;Patient/family education;Therapeutic activities;DME and/or AE instruction;Balance training;Cognitive remediation/compensation    OT Goals(Current goals can be found in the care plan section) Acute Rehab OT Goals Patient Stated Goal: to get up to walk again and get home to rehab in SNF Time For Goal Achievement: 07/29/16 Potential to Achieve Goals: Good  OT Frequency: Min 2X/week              End of Session Equipment Utilized During Treatment: Rolling walker Nurse Communication: Mobility status  Activity Tolerance: Patient tolerated treatment well Patient left: in chair;with call bell/phone within reach   Time: [id number] OT Time Calculation  (min): 37 min Charges:  OT General Charges $OT Visit: 1 Procedure OT Evaluation $OT Eval Moderate Complexity: 1 Procedure OT Treatments $Self Care/Home Management : 23-37 mins Ayelen Sciortino OTR/L 07/22/2016, 12:39 PM

## 2016-07-22 NOTE — Progress Notes (Signed)
SW attempted to meet with pt. However, OT is currently in the room working with the patient. SW will continue to follow up.  Tilda Burrow, MSW [phone]

## 2016-07-22 NOTE — Op Note (Signed)
[name], [name]              ACCOUNT NO.:  [id number]  MEDICAL RECORD NO.:  [id number]  LOCATION:  5N05C                        FACILITY:  [facility]  PHYSICIAN:  Anderson Malta, M.D.    DATE OF BIRTH:  [birth year]  DATE OF PROCEDURE: DATE OF DISCHARGE:                              OPERATIVE REPORT   PREOPERATIVE DIAGNOSIS:  Right hip osteoarthritis.  POSTOPERATIVE DIAGNOSIS:  Right hip osteoarthritis.  PROCEDURE:  Right total hip replacement using Pinnacle cup 52 with 1 screw, +4 liner, 11 Corail stem, -2.5 metal, 36 femoral head.  SURGEON:  Anderson Malta, M.D.  ASSISTANT:  Laure Kidney, RNFA.  INDICATIONS:  [name] is an 82 year old patient with end-stage right hip arthritis, presents for operative management after explanation of risks and benefits.  PROCEDURE IN DETAIL:  The patient was brought to the operating room where general anesthetic was induced.  Preoperative antibiotics were administered.  Time-out was called.  The patient was placed on the Hana bed with legs in approximately the same amount of rotation.  The area was prescrubbed with alcohol and Betadine, allowed to air dry, prepped with DuraPrep solution and draped in a sterile manner.  Wall drape was utilized and incision was made about 2 cm distal and lateral to the anterior superior iliac crest.  Skin and subcutaneous tissue were sharply divided.  Fascia lata was divided over the tensor fascia lata. A blunt acetabular retractor was placed on the superior aspect of the femoral neck.  The crossing circumflex vessels were coagulated.  Using Cobb elevator, the reflected head of the rectus was then mobilized anteriorly and the neck was identified.  Beginning at the acetabulum and extending distally, the capsule was incised.  This was also tagged. Some of it was resected in order to facilitate visualization.  Initial neck cut was made, but this had to be revised under fluoroscopy in order to get the appropriate  length.  The head was then removed.  The acetabular retractors were placed for visualization and soft tissue was removed.  The acetabulum was reamed up to accept a 52 cup.  The cup was placed with very good purchase obtained.  One screw was placed with good purchase obtained.  +4 liner was placed.  The exposure in general was difficult because of the contracted nature of the tissues from the long- standing hip arthritis.  At this time, femoral lift was placed, but not engaged and the leg was taken into extension and abduction.  Mueller retractor was placed.  Trochanteric retractor was placed and the conjoined tendon was released in order to facilitate visualization.  The canal was then broached up to a size 12 and the 12 stem was placed, which was about 4-5 mm proud on the slightly short femoral neck cut. This was then reduced with a +2.5 and -2.5 ball.  The -2.5 ball gave excellent stability of the internal and external rotation as well as about 50 degrees of extension and 90 degrees of external rotation.  Leg lengths looked best with the -2.5 ball.  Trial components were removed on the femoral side.  True components were placed with same stability parameters maintained.  At this time, the true components were  placed after thorough irrigation.  Same stability parameters were maintained. Capsule was closed using #1 Vicryl suture followed by interrupted inverted 0 Vicryl suture to close the fascia lata followed by 2-0 Vicryl and then the running 3-0 Monopril.  Impervious dressing was placed.  Leg lengths approximately equal visually and they were close to being exactly equal on fluoroscopy.  The cup position was also in about 40 degrees of abduction and about 10-15 of anteversion.  The patient tolerated the procedure well without immediate complication, transferred to the recovery room in stable condition.     Anderson Malta, M.D.     GSD/MEDQ  D:  07/21/2016  T:  07/22/2016  Job:   [id number]

## 2016-07-22 NOTE — Progress Notes (Signed)
Physical Therapy Treatment Patient Details Name: [name] MRN: [id number] DOB: Apr 17, 1931 Today's Date: 07/22/2016    History of Present Illness 82 yo male with new R THA with direct anterior approach after failed OA management.  PMHx:  spinal stenosis lumbar area, HLD, anxiety, HOH, foraminal decompression R lumbar spine    PT Comments    Weight bearing status was in place by MD at [facility]. Performed gait training with RW with cues for maintain right toe forward and for increased step height on RLE to reduce dragging. Pt is assisted to bathroom and performed toileting on bed side commode due to feeling unsafe with regular commode. Pt is able to perform transfers and gait with min a this visit.   Follow Up Recommendations  SNF     Equipment Recommendations  None recommended by PT    Recommendations for Other Services       Precautions / Restrictions Precautions Precautions: Back;Fall Restrictions Weight Bearing Restrictions: Yes RLE Weight Bearing: Weight bearing as tolerated    Mobility  Bed Mobility Overal bed mobility: Needs Assistance Bed Mobility: Supine to Sit     Supine to sit: Min assist     General bed mobility comments: min a for safety to bring RLE OOB.   Transfers Overall transfer level: Needs assistance Equipment used: Rolling walker (2 wheeled) Transfers: Sit to/from Stand Sit to Stand: Min assist         General transfer comment: min a for safety to standing and cues for proper hand plaement  Ambulation/Gait Ambulation/Gait assistance: Min assist Ambulation Distance (Feet): 50 Feet Assistive device: Rolling walker (2 wheeled) Gait Pattern/deviations: Step-through pattern;Decreased step length - left;Decreased stance time - right;Antalgic Gait velocity: decreased Gait velocity interpretation: <1.8 ft/sec, indicative of risk for recurrent falls General Gait Details: mild antalgic gait with cues to maintain Right toe forward during  gait   Stairs            Wheelchair Mobility    Modified Rankin (Stroke Patients Only)       Balance Overall balance assessment: Needs assistance Sitting-balance support: Feet supported;Single extremity supported Sitting balance-Leahy Scale: Fair Sitting balance - Comments: sitting EOB no back support   Standing balance support: Bilateral upper extremity supported Standing balance-Leahy Scale: Poor Standing balance comment: relies on RW for stability in standing                    Cognition Arousal/Alertness: Awake/alert Behavior During Therapy: WFL for tasks assessed/performed Overall Cognitive Status: Within Functional Limits for tasks assessed                      Exercises      General Comments        Pertinent Vitals/Pain Pain Assessment: 0-10 Pain Score: 5  Pain Location: right hip Pain Descriptors / Indicators: Aching Pain Intervention(s): Monitored during session;Premedicated before session    Home Living                      Prior Function            PT Goals (current goals can now be found in the care plan section) Acute Rehab PT Goals Patient Stated Goal: to get up to walk again and get home to rehab in SNF Progress towards PT goals: Progressing toward goals    Frequency    7X/week      PT Plan Current plan remains appropriate    Co-evaluation  End of Session Equipment Utilized During Treatment: Gait belt Activity Tolerance: Patient tolerated treatment well Patient left: in chair;with call bell/phone within reach     Time: 1030-1115 PT Time Calculation (min) (ACUTE ONLY): 45 min  Charges:  $Gait Training: 23-37 mins $Therapeutic Activity: 8-22 mins                    G Codes:      Scheryl Marten PT, DPT  [phone]  07/22/2016, 11:29 AM

## 2016-07-23 LAB — GLUCOSE, CAPILLARY: Glucose-Capillary: 117 mg/dL — ABNORMAL HIGH (ref 65–99)

## 2016-07-23 NOTE — Clinical Social Work Note (Signed)
Patient will return to [facility] at time of discharge per liaison.  Possible dc tomorrow per ortho MD.  Facility updated and agreeable.  Disposition: [facility] (return)  Nonnie Done, MSW, LCSW  [phone]  Licensed Clinical Social Worker

## 2016-07-23 NOTE — Progress Notes (Signed)
Subjective: Pt stable - pain ok - moving in room   Objective: Vital signs in last 24 hours: Temp:  [97.5 F (36.4 C)-98.2 F (36.8 C)] 97.5 F (36.4 C) (11/15 0742) Pulse Rate:  [74-101] 101 (11/15 0742) Resp:  [16] 16 (11/15 0742) BP: (82-135)/(45-91) 135/91 (11/15 0742) SpO2:  [92 %-100 %] 92 % (11/15 0742)  Intake/Output from previous day: 11/14 0701 - 11/15 0700 In: 720 [P.O.:720] Out: 1000 [Urine:1000] Intake/Output this shift: Total I/O In: -  Out: 300 [Urine:300]  Exam:  Sensation intact distally Intact pulses distally  Labs:  Recent Labs  07/22/16 0854  HGB 10.8*    Recent Labs  07/22/16 0854  WBC 8.0  RBC 3.68*  HCT 33.3*  PLT 168    Recent Labs  07/22/16 0854  NA 138  K 4.6  CL 108  CO2 22  BUN 12  CREATININE 0.72  GLUCOSE 105*  CALCIUM 8.7*   No results for input(s): LABPT, INR in the last 72 hours.  Assessment/Plan: Plan snf am - pt ot today   [employer] 07/23/2016, 8:24 AM

## 2016-07-23 NOTE — Progress Notes (Signed)
Physical Therapy Treatment Patient Details Name: [name] MRN: [id number] DOB: 14-May-1931 Today's Date: 07/23/2016    History of Present Illness 82 yo male with new R THA with direct anterior approach after failed OA management.  PMHx:  spinal stenosis lumbar area, HLD, anxiety, HOH, foraminal decompression R lumbar spine    PT Comments    Pt presents with increased right hip pain following sitting up in chair for 3 hours. Assisted pt to bathroom with min a for safety and back to bed. Pt requires a change in socks as he was unable to make it to the bathroom on time. Performed supine exercises in bed. Rn notified of increase in pain and that patient was in need of medication if it was appropriate.   Follow Up Recommendations  SNF     Equipment Recommendations  None recommended by PT    Recommendations for Other Services       Precautions / Restrictions Precautions Precautions: Back;Fall Precaution Booklet Issued: No Restrictions Weight Bearing Restrictions: Yes RLE Weight Bearing: Weight bearing as tolerated    Mobility  Bed Mobility Overal bed mobility: Needs Assistance Bed Mobility: Sit to Supine       Sit to supine: Mod assist   General bed mobility comments: Mod a for safety to bring RLE into bed  Transfers Overall transfer level: Needs assistance Equipment used: Rolling walker (2 wheeled) Transfers: Sit to/from Stand Sit to Stand: Min assist         General transfer comment: Min assist for technique and hand placement.  Ambulation/Gait Ambulation/Gait assistance: Min assist Ambulation Distance (Feet): 20 Feet Assistive device: Rolling walker (2 wheeled) Gait Pattern/deviations: Step-to pattern;Antalgic Gait velocity: decreased Gait velocity interpretation: Below normal speed for age/gender General Gait Details: moderate antalgia noted to bathroom and back to bed due to increased pain   Stairs            Wheelchair Mobility     Modified Rankin (Stroke Patients Only)       Balance Overall balance assessment: Needs assistance         Standing balance support: Bilateral upper extremity supported Standing balance-Leahy Scale: Poor Standing balance comment: Relies on RW for stability in standing                    Cognition Arousal/Alertness: Awake/alert Behavior During Therapy: WFL for tasks assessed/performed Overall Cognitive Status: Within Functional Limits for tasks assessed                      Exercises Total Joint Exercises Ankle Circles/Pumps: AROM;Both;20 reps;Supine Quad Sets: AROM;Right;10 reps;Supine Heel Slides: AROM;Right;10 reps;Supine    General Comments        Pertinent Vitals/Pain Pain Assessment: 0-10 Pain Score: 8  Pain Location: right hip Pain Descriptors / Indicators: Aching;Sharp Pain Intervention(s): Monitored during session;Repositioned;Ice applied    Home Living                      Prior Function            PT Goals (current goals can now be found in the care plan section) Acute Rehab PT Goals Patient Stated Goal: to get up to walk again and get home to rehab in SNF Progress towards PT goals: Not progressing toward goals - comment (limited by pain this session )    Frequency    7X/week      PT Plan Current plan remains appropriate  Co-evaluation             End of Session Equipment Utilized During Treatment: Gait belt Activity Tolerance: Patient limited by pain Patient left: in bed;with call bell/phone within reach     Time: 1141-1201 PT Time Calculation (min) (ACUTE ONLY): 20 min  Charges:  $Gait Training: 8-22 mins                    G Codes:      Scheryl Marten PT, DPT  [phone]  07/23/2016, 4:06 PM

## 2016-07-23 NOTE — Progress Notes (Signed)
Placed patient on CPAP set at 8cm for the night.  

## 2016-07-24 ENCOUNTER — Other Ambulatory Visit (INDEPENDENT_AMBULATORY_CARE_PROVIDER_SITE_OTHER): Payer: Self-pay | Admitting: Radiology

## 2016-07-24 ENCOUNTER — Inpatient Hospital Stay (HOSPITAL_COMMUNITY): Payer: Medicare Other

## 2016-07-24 DIAGNOSIS — I8290 Acute embolism and thrombosis of unspecified vein: Secondary | ICD-10-CM

## 2016-07-24 DIAGNOSIS — Z9889 Other specified postprocedural states: Secondary | ICD-10-CM

## 2016-07-24 DIAGNOSIS — R609 Edema, unspecified: Secondary | ICD-10-CM

## 2016-07-24 DIAGNOSIS — I872 Venous insufficiency (chronic) (peripheral): Secondary | ICD-10-CM | POA: Insufficient documentation

## 2016-07-24 DIAGNOSIS — R600 Localized edema: Secondary | ICD-10-CM | POA: Insufficient documentation

## 2016-07-24 DIAGNOSIS — M1611 Unilateral primary osteoarthritis, right hip: Secondary | ICD-10-CM | POA: Insufficient documentation

## 2016-07-24 HISTORY — DX: Edema, unspecified: R60.9

## 2016-07-24 HISTORY — DX: Unilateral primary osteoarthritis, right hip: M16.11

## 2016-07-24 HISTORY — DX: Acute embolism and thrombosis of unspecified vein: I82.90

## 2016-07-24 MED ORDER — APIXABAN 5 MG PO TABS: 5.0000 mg | ORAL_TABLET | Freq: Two times a day (BID) | ORAL | Status: DC

## 2016-07-24 MED ORDER — APIXABAN 5 MG PO TABS: 5.0000 mg | ORAL_TABLET | Freq: Two times a day (BID) | ORAL | 0 refills | Status: DC

## 2016-07-24 MED ORDER — ASPIRIN 325 MG PO TBEC: 325.0000 mg | DELAYED_RELEASE_TABLET | Freq: Every day | ORAL | 0 refills | Status: DC

## 2016-07-24 MED ORDER — APIXABAN 5 MG PO TABS: 10.0000 mg | ORAL_TABLET | Freq: Two times a day (BID) | ORAL | 1 refills | Status: DC

## 2016-07-24 MED ORDER — HYDROCODONE-ACETAMINOPHEN 10-325 MG PO TABS: 1.0000 | ORAL_TABLET | ORAL | 0 refills | Status: DC | PRN

## 2016-07-24 MED ORDER — APIXABAN 5 MG PO TABS
10.0000 mg | ORAL_TABLET | Freq: Two times a day (BID) | ORAL | Status: DC
  Administered 2016-07-24: 10 mg via ORAL
  Filled 2016-07-24: qty 2

## 2016-07-24 NOTE — Discharge Instructions (Signed)
Information on my medicine - ELIQUIS (apixaban)  This medication education was reviewed with me or my healthcare representative as part of my discharge preparation.  The pharmacist that spoke with me during my hospital stay was:  Jaquita Folds, [facility]  Why was Eliquis prescribed for you? Eliquis was prescribed to treat blood clots that may have been found in the veins of your legs (deep vein thrombosis) or in your lungs (pulmonary embolism) and to reduce the risk of them occurring again.  What do You need to know about Eliquis ? The starting dose is 10 mg (two 5 mg tablets) taken TWICE daily for the FIRST SEVEN (7) DAYS, then on 07/31/16  the dose is reduced to ONE 5 mg tablet taken TWICE daily.  Eliquis may be taken with or without food.   Try to take the dose about the same time in the morning and in the evening. If you have difficulty swallowing the tablet whole please discuss with your pharmacist how to take the medication safely.  Take Eliquis exactly as prescribed and DO NOT stop taking Eliquis without talking to the doctor who prescribed the medication.  Stopping may increase your risk of developing a new blood clot.  Refill your prescription before you run out.  After discharge, you should have regular check-up appointments with your healthcare provider that is prescribing your Eliquis.    What do you do if you miss a dose? If a dose of ELIQUIS is not taken at the scheduled time, take it as soon as possible on the same day and twice-daily administration should be resumed. The dose should not be doubled to make up for a missed dose.  Important Safety Information A possible side effect of Eliquis is bleeding. You should call your healthcare provider right away if you experience any of the following: ? Bleeding from an injury or your nose that does not stop. ? Unusual colored urine (red or dark brown) or unusual colored stools (red or black). ? Unusual bruising for unknown  reasons. ? A serious fall or if you hit your head (even if there is no bleeding).  Some medicines may interact with Eliquis and might increase your risk of bleeding or clotting while on Eliquis. To help avoid this, consult your healthcare provider or pharmacist prior to using any new prescription or non-prescription medications, including herbals, vitamins, non-steroidal anti-inflammatory drugs (NSAIDs) and supplements.  This website has more information on Eliquis (apixaban): http://www.eliquis.com/eliquis/home

## 2016-07-24 NOTE — Progress Notes (Signed)
Transport arrived at this time to p/u patient. Pt had still been resting w/ eyes closed prior to this time.

## 2016-07-24 NOTE — Clinical Social Work Note (Signed)
Patient will discharge today per MD order. Patient will discharge to: [place] (return) RN to call report prior to transportation to: [phone] Transportation: [place] sent discharge summary to SNF for review.    Nonnie Done, MSW, LCSW  [id number]) [phone]  Licensed Clinical Social Worker

## 2016-07-24 NOTE — Progress Notes (Signed)
Physical Therapy Treatment Patient Details Name: [name] MRN: [id number] DOB: Aug 17, 1931 Today's Date: 07/24/2016    History of Present Illness 82 yo male with new R THA with direct anterior approach after failed OA management.  PMHx:  spinal stenosis lumbar area, HLD, anxiety, HOH, foraminal decompression R lumbar spine    PT Comments    Pt presents with decreased pain this session. Performed gait training with RW with min cues for proper sequencing required. Performed seated Le exercises x 10. Pt to DC to SNF this afternoon. Pt continues to require assistance for safe transfers, gait and bed mobs.   Follow Up Recommendations  SNF     Equipment Recommendations  None recommended by PT    Recommendations for Other Services       Precautions / Restrictions Precautions Precautions: Back;Fall Precaution Booklet Issued: No Restrictions Weight Bearing Restrictions: Yes RLE Weight Bearing: Weight bearing as tolerated    Mobility  Bed Mobility                  Transfers Overall transfer level: Needs assistance Equipment used: Rolling walker (2 wheeled) Transfers: Sit to/from Stand Sit to Stand: Min assist         General transfer comment: Min assist for technique and hand placement.  Ambulation/Gait Ambulation/Gait assistance: Min guard Ambulation Distance (Feet): 75 Feet Assistive device: Rolling walker (2 wheeled) Gait Pattern/deviations: Step-to pattern;Decreased step length - left;Decreased stance time - right;Antalgic Gait velocity: decreased Gait velocity interpretation: Below normal speed for age/gender General Gait Details: moderate antalgia noted, improved with distance   Stairs            Wheelchair Mobility    Modified Rankin (Stroke Patients Only)       Balance Overall balance assessment: Needs assistance Sitting-balance support: No upper extremity supported;Feet supported Sitting balance-Leahy Scale: Fair     Standing  balance support: Bilateral upper extremity supported Standing balance-Leahy Scale: Poor                      Cognition Arousal/Alertness: Awake/alert Behavior During Therapy: WFL for tasks assessed/performed Overall Cognitive Status: Within Functional Limits for tasks assessed                      Exercises Total Joint Exercises Ankle Circles/Pumps: AROM;Both;20 reps;Supine Long Arc Quad: AROM;10 reps;Right;Supine    General Comments        Pertinent Vitals/Pain Pain Assessment: 0-10 Pain Score: 6  Pain Location: right hip Pain Descriptors / Indicators: Aching;Sore Pain Intervention(s): Monitored during session;Repositioned    Home Living                      Prior Function            PT Goals (current goals can now be found in the care plan section) Acute Rehab PT Goals Patient Stated Goal: to get up to walk again and get home to rehab in SNF Progress towards PT goals: Progressing toward goals    Frequency    7X/week      PT Plan Current plan remains appropriate    Co-evaluation             End of Session Equipment Utilized During Treatment: Gait belt Activity Tolerance: Patient limited by pain Patient left: in chair;with call bell/phone within reach;with family/visitor present     Time: [id number] PT Time Calculation (min) (ACUTE ONLY): 21 min  Charges:  $Gait Training:  8-22 mins                    G Codes:      Scheryl Marten PT, DPT  [phone]  07/24/2016, 11:01 AM

## 2016-07-24 NOTE — Progress Notes (Signed)
Patient has superficial DVT in the right gastroc vein.  I consult did internal medicine regarding optimal management strategy.  They recommended Eliquis 10 mg by mouth twice a day for 7 days then 5 mg by mouth twice a day for 3 months.  Okay to discharge today.  Medications will be adjusted

## 2016-07-24 NOTE — Progress Notes (Signed)
Subjective: Pt stable - pain ok   Objective: Vital signs in last 24 hours: Temp:  [98.3 F (36.8 C)-98.6 F (37 C)] 98.6 F (37 C) (11/16 0725) Pulse Rate:  [80-90] 80 (11/16 0725) Resp:  [16] 16 (11/16 0725) BP: (104-142)/(57-65) 142/65 (11/16 0725) SpO2:  [94 %-95 %] 95 % (11/16 0725)  Intake/Output from previous day: 11/15 0701 - 11/16 0700 In: 480 [P.O.:480] Out: 800 [Urine:800] Intake/Output this shift: No intake/output data recorded.  Exam:  Dorsiflexion/Plantar flexion intact  Labs:  Recent Labs  07/22/16 0854  HGB 10.8*    Recent Labs  07/22/16 0854  WBC 8.0  RBC 3.68*  HCT 33.3*  PLT 168    Recent Labs  07/22/16 0854  NA 138  K 4.6  CL 108  CO2 22  BUN 12  CREATININE 0.72  GLUCOSE 105*  CALCIUM 8.7*   No results for input(s): LABPT, INR in the last 72 hours.  Assessment/Plan: Dc to snf today   G Alphonzo Severance 07/24/2016, 8:29 AM

## 2016-07-24 NOTE — Progress Notes (Signed)
CSW arranged [facility] transportation to [facility] [facility].

## 2016-07-24 NOTE — Progress Notes (Signed)
*  Preliminary Results* Bilateral lower extremity venous duplex completed. The right lower extremity is positive for acute deep vein thrombosis involving a single right gastrocnemius vein. The left lower extremity is negative for deep vein thrombosis. There is no evidence of Baker's cyst bilaterally.  Preliminary results discussed with Karena Addison, RN.  07/24/2016 12:20 PM Maudry Mayhew, BS, RVT, RDCS, RDMS

## 2016-07-24 NOTE — Progress Notes (Signed)
Pt pending pickup from transport company to get to assisted living center for discharge. Secretary called company to see if ETA available. Patient still on the list for pick up, though no specific ETA able to be given. Pt is resting with eyes closed at this time. No s/s distresses noted.

## 2016-07-24 NOTE — Discharge Summary (Addendum)
Physician Discharge Summary  Patient ID: [name] MRN: [id number] DOB/AGE: 10-11-1930 82 y.o.  Admit date: 07/21/2016 Discharge date: 07/24/2016  Admission Diagnoses:  Active Problems:   Hip arthritis   Discharge Diagnoses:  Same  Surgeries: Procedure(s): TOTAL HIP ARTHROPLASTY ANTERIOR APPROACH on 07/21/2016   Consultants:   Discharged Condition: Stable  Hospital Course: [name] is an 82 y.o. male who was admitted 07/21/2016 with a chief complaint of right hip pain, and found to have a diagnosis of right hip arthritis.  They were brought to the operating room on 07/21/2016 and underwent the above named procedures.Did well with PT and dced to snf pod 3 wbat in good condition  He was noted to have a superficial right-sided gastrocnemius DVT before discharge.  Treatment was initiated with with Eliquis 10 mg by mouth twice a day for 7 days then 5 mg twice a day for 3 months.  Aspirin will be discontinued.  Antibiotics given:  Anti-infectives    Start     Dose/Rate Route Frequency Ordered Stop   07/21/16 1400  ceFAZolin (ANCEF) IVPB 2g/100 mL premix     2 g 200 mL/hr over 30 Minutes Intravenous Every 6 hours 07/21/16 1358 07/21/16 2158   07/21/16 0634  ceFAZolin (ANCEF) IVPB 2g/100 mL premix     2 g 200 mL/hr over 30 Minutes Intravenous On call to O.R. 07/21/16 [id number] 07/21/16 0732    .  Recent vital signs:  Vitals:   07/23/16 2241 07/24/16 0725  BP:  (!) 142/65  Pulse: 85 80  Resp: 16 16  Temp:  98.6 F (37 C)    Recent laboratory studies:  Results for orders placed or performed during the hospital encounter of 07/21/16  CBC with Differential/Platelet  Result Value Ref Range   WBC 8.0 4.0 - 10.5 K/uL   RBC 3.68 (L) 4.22 - 5.81 MIL/uL   Hemoglobin 10.8 (L) 13.0 - 17.0 g/dL   HCT 33.3 (L) 39.0 - 52.0 %   MCV 90.5 78.0 - 100.0 fL   MCH 29.3 26.0 - 34.0 pg   MCHC 32.4 30.0 - 36.0 g/dL   RDW 14.6 11.5 - 15.5 %   Platelets 168 150 - 400 K/uL    Neutrophils Relative % 79 %   Neutro Abs 6.2 1.7 - 7.7 K/uL   Lymphocytes Relative 13 %   Lymphs Abs 1.1 0.7 - 4.0 K/uL   Monocytes Relative 8 %   Monocytes Absolute 0.7 0.1 - 1.0 K/uL   Eosinophils Relative 0 %   Eosinophils Absolute 0.0 0.0 - 0.7 K/uL   Basophils Relative 0 %   Basophils Absolute 0.0 0.0 - 0.1 K/uL  Basic metabolic panel  Result Value Ref Range   Sodium 138 135 - 145 mmol/L   Potassium 4.6 3.5 - 5.1 mmol/L   Chloride 108 101 - 111 mmol/L   CO2 22 22 - 32 mmol/L   Glucose, Bld 105 (H) 65 - 99 mg/dL   BUN 12 6 - 20 mg/dL   Creatinine, Ser 0.72 0.61 - 1.24 mg/dL   Calcium 8.7 (L) 8.9 - 10.3 mg/dL   GFR calc non Af Amer >60 >60 mL/min   GFR calc Af Amer >60 >60 mL/min   Anion gap 8 5 - 15  Glucose, capillary  Result Value Ref Range   Glucose-Capillary 117 (H) 65 - 99 mg/dL    Discharge Medications:     Medication List    STOP taking these medications   HYDROcodone-acetaminophen 5-325  MG tablet Commonly known as:  NORCO/VICODIN Replaced by:  HYDROcodone-acetaminophen 10-325 MG tablet   meloxicam 15 MG tablet Commonly known as:  MOBIC     TAKE these medications   aspirin 325 MG EC tablet Take 1 tablet (325 mg total) by mouth daily with breakfast. What changed:  medication strength  how much to take  when to take this   atorvastatin 40 MG tablet Commonly known as:  LIPITOR Take 40 mg by mouth daily at 12 noon.   cetirizine 10 MG tablet Commonly known as:  ZYRTEC Take 10 mg by mouth daily as needed for allergies.   EQL COQ10 300 MG Caps Generic drug:  Coenzyme Q10 Take 300 mg by mouth daily.   fluticasone 50 MCG/ACT nasal spray Commonly known as:  FLONASE Place 2 sprays into both nostrils daily.   GLUCOSAMINE CHOND TRIPLE/VIT D PO Take 2 tablets by mouth daily.   HYDROcodone-acetaminophen 10-325 MG tablet Commonly known as:  NORCO Take 1 tablet by mouth every 4 (four) hours as needed (breakthrough pain). Replaces:   HYDROcodone-acetaminophen 5-325 MG tablet   lisinopril 10 MG tablet Commonly known as:  PRINIVIL,ZESTRIL Take 10 mg by mouth daily.   meclizine 25 MG tablet Commonly known as:  ANTIVERT Take 25 mg by mouth 3 (three) times daily as needed (for dizziness). Travel Sickness Tablets   sodium chloride 0.65 % Soln nasal spray Commonly known as:  OCEAN Place 2 sprays into both nostrils as needed for congestion.       Diagnostic Studies: Dg C-arm Gt 120 Min  Result Date: 07/21/2016 CLINICAL DATA:  Anterior approach right hip arthroplasty. Reported fluoro time is 50 seconds EXAM: DG C-ARM GT 120 MIN; OPERATIVE RIGHT HIP WITH PELVIS CONTRAST:  None FLUOROSCOPY TIME:  Fluoroscopy Time:  50 seconds Number of Acquired Spot Images: 3 COMPARISON:  None available in PACs FINDINGS: 3 fluoro spot images reveal the patient to of undergone total right hip arthroplasty. Radiographic positioning of the prosthetic components is good. The native bone exhibits no acute abnormality. The overlying soft tissues are unremarkable. IMPRESSION: Status post anterior approach right total hip arthroplasty. No immediate postprocedure complication. Electronically Signed   By: David  Martinique M.D.   On: 07/21/2016 11:10   Dg Hip Port Unilat With Pelvis 1v Right  Result Date: 07/21/2016 CLINICAL DATA:  Post- operative right total hip arthroplasty. EXAM: DG HIP (WITH OR WITHOUT PELVIS) 1V PORT RIGHT COMPARISON:  Intraoperative radiographic series of today's date FINDINGS: The prosthetic hip joint appears to be in reasonable position. The interface with the native bone is normal. The native bone exhibits no acute abnormality. The surrounding soft tissues are normal. IMPRESSION: There is no postprocedure complication following right total hip joint replacement. Electronically Signed   By: David  Martinique M.D.   On: 07/21/2016 12:38   Dg Hip Operative Unilat With Pelvis Right  Result Date: 07/21/2016 CLINICAL DATA:  Anterior approach  right hip arthroplasty. Reported fluoro time is 50 seconds EXAM: DG C-ARM GT 120 MIN; OPERATIVE RIGHT HIP WITH PELVIS CONTRAST:  None FLUOROSCOPY TIME:  Fluoroscopy Time:  50 seconds Number of Acquired Spot Images: 3 COMPARISON:  None available in PACs FINDINGS: 3 fluoro spot images reveal the patient to of undergone total right hip arthroplasty. Radiographic positioning of the prosthetic components is good. The native bone exhibits no acute abnormality. The overlying soft tissues are unremarkable. IMPRESSION: Status post anterior approach right total hip arthroplasty. No immediate postprocedure complication. Electronically Signed   By: Shanon Brow  Martinique M.D.   On: 07/21/2016 11:10   Xr Hip Unilat W Or W/o Pelvis 2-3 Views Right  Result Date: 07/04/2016 2 views right hip ordered and obtained shows endstage arthritis in the right hip with some collapse of the femoral head.  Left hip on the AP pelvis has a maintained joint space   Disposition: 01-Home or Self Care  Discharge Instructions    Call MD / Call 911    Complete by:  As directed    If you experience chest pain or shortness of breath, CALL 911 and be transported to the hospital emergency room.  If you develope a fever above 101 F, pus (white drainage) or increased drainage or redness at the wound, or calf pain, call your surgeon's office.   Constipation Prevention    Complete by:  As directed    Drink plenty of fluids.  Prune juice may be helpful.  You may use a stool softener, such as Colace (over the counter) 100 mg twice a day.  Use MiraLax (over the counter) for constipation as needed.   Diet - low sodium heart healthy    Complete by:  As directed    Discharge instructions    Complete by:  As directed    Weight bearing as tolerated right leg Ok to change dressing on tuesday   Increase activity slowly as tolerated    Complete by:  As directed          Signed: Anderson Malta 07/24/2016, 8:34 AM

## 2016-07-25 ENCOUNTER — Telehealth (INDEPENDENT_AMBULATORY_CARE_PROVIDER_SITE_OTHER): Payer: Self-pay | Admitting: Orthopedic Surgery

## 2016-07-25 ENCOUNTER — Telehealth (INDEPENDENT_AMBULATORY_CARE_PROVIDER_SITE_OTHER): Payer: Self-pay

## 2016-07-25 DIAGNOSIS — R269 Unspecified abnormalities of gait and mobility: Secondary | ICD-10-CM

## 2016-07-25 DIAGNOSIS — Z9181 History of falling: Secondary | ICD-10-CM

## 2016-07-25 HISTORY — DX: History of falling: Z91.81

## 2016-07-25 HISTORY — DX: Unspecified abnormalities of gait and mobility: R26.9

## 2016-07-25 NOTE — Progress Notes (Signed)
Charge RN Rod Holler received call from patients wife whom states that the nasal mask and tubing of his CPAP are not with his belongings upon arrival to his discharged facility. The nasal mask and tubing were found still here in the hospital. These items were placed in a bag and kept at the nurses station-- pending wife to pick them up.

## 2016-07-25 NOTE — Telephone Encounter (Signed)
[facility], needs clarification on 2 medications.  1. Aspirin  2. Eliquis Please call her back ASAP. She will only be there until 3 pm today  CB [phone] ext [phone]

## 2016-07-28 ENCOUNTER — Non-Acute Institutional Stay (SKILLED_NURSING_FACILITY): Payer: Medicare Other | Admitting: Internal Medicine

## 2016-07-28 ENCOUNTER — Encounter: Payer: Self-pay | Admitting: Internal Medicine

## 2016-07-28 DIAGNOSIS — K5900 Constipation, unspecified: Secondary | ICD-10-CM | POA: Insufficient documentation

## 2016-07-28 DIAGNOSIS — I10 Essential (primary) hypertension: Secondary | ICD-10-CM

## 2016-07-28 DIAGNOSIS — R300 Dysuria: Secondary | ICD-10-CM

## 2016-07-28 DIAGNOSIS — Z7901 Long term (current) use of anticoagulants: Secondary | ICD-10-CM | POA: Diagnosis not present

## 2016-07-28 DIAGNOSIS — M1611 Unilateral primary osteoarthritis, right hip: Secondary | ICD-10-CM

## 2016-07-28 DIAGNOSIS — F0150 Vascular dementia without behavioral disturbance: Secondary | ICD-10-CM | POA: Insufficient documentation

## 2016-07-28 DIAGNOSIS — R269 Unspecified abnormalities of gait and mobility: Secondary | ICD-10-CM

## 2016-07-28 DIAGNOSIS — G4730 Sleep apnea, unspecified: Secondary | ICD-10-CM | POA: Insufficient documentation

## 2016-07-28 DIAGNOSIS — K5903 Drug induced constipation: Secondary | ICD-10-CM

## 2016-07-28 DIAGNOSIS — D649 Anemia, unspecified: Secondary | ICD-10-CM | POA: Diagnosis not present

## 2016-07-28 DIAGNOSIS — I8290 Acute embolism and thrombosis of unspecified vein: Secondary | ICD-10-CM | POA: Diagnosis not present

## 2016-07-28 DIAGNOSIS — Z9181 History of falling: Secondary | ICD-10-CM | POA: Diagnosis not present

## 2016-07-28 DIAGNOSIS — R350 Frequency of micturition: Secondary | ICD-10-CM

## 2016-07-28 DIAGNOSIS — R634 Abnormal weight loss: Secondary | ICD-10-CM

## 2016-07-28 DIAGNOSIS — M48061 Spinal stenosis, lumbar region without neurogenic claudication: Secondary | ICD-10-CM | POA: Diagnosis not present

## 2016-07-28 DIAGNOSIS — M1610 Unilateral primary osteoarthritis, unspecified hip: Secondary | ICD-10-CM | POA: Diagnosis not present

## 2016-07-28 DIAGNOSIS — R413 Other amnesia: Secondary | ICD-10-CM

## 2016-07-28 DIAGNOSIS — R609 Edema, unspecified: Secondary | ICD-10-CM

## 2016-07-28 HISTORY — DX: Other amnesia: R41.3

## 2016-07-28 HISTORY — DX: Dysuria: R30.0

## 2016-07-28 NOTE — Progress Notes (Signed)
History and Physical     Location:  [place] Room Number: 40 Place of Service:  SNF (31)  PCP: Melinda Crutch, MD Patient Care Team: Lawerance Cruel, MD as PCP - General (Family Medicine) [name], MD as Consulting Physician (Orthopedic Surgery) [name], MD as Consulting Physician (Gastroenterology) Provider Not In System  Extended Emergency Contact Information Primary Emergency Contact: [name] Address: [address]          [place], [place] [address] [place] Mobile Phone: [phone] Relation: Spouse  Code Status: full Goals of Care: Advanced Directive information Advanced Directives 07/28/2016  Does patient have an advance directive? Yes  Type of Paramedic of [place];Living will  Does patient want to make changes to advanced directive? No - Patient declined  Copy of advanced directive(s) in chart? Yes      Chief Complaint  Patient presents with  . New Admit To SNF    following hospitalization    HPI: Patient is a 82 y.o. male seen today for new admission to [facility] SNF on 07/24/16 following hospitalization from 07/21/16/ t 07/24/16. Patient had OA of the right hip with intese pain that failed conservative measures for pain control. He had right THA on 07/21/16. He is doing better with the pain in the hip.   Unfortunately he continues to have sever pain in the right knee. He has known OA of this knee.   Also, he is still recovering from lumbar laminectomy at L4-5 by Dr. Saintclair Halsted done 06/20/16. Previous sciatica at this level seems resolved.   He has acquired a significant worsening of his chronic edema of the loweer legs during this period of time.  Chronic HTN is controlled.  Obstructive sleep apnea is treated with CPAP. He says he uses it regularly.  Constipation remains a problem and may interact with his new issues of loss of bladder control,  dysuria, urgency and frequency..  He has a gait instability and generallized weakness. He is using a rolling walker.  All this is superimposed on a weight loss of about 40# that occurred in August 2017. He has had evaluation, including Gi eval. Etiology of the weight loss remains obscure. It is thought that most of it was generated by the use of pain medications used to control his right sided pains from his back, hip , and knee.  Patient is concerned about memory lapses. He had 2 sisters died with memory disorder and has 2 living sisters with memory problems.  Past Medical History:  Diagnosis Date  . Anemia   . Anticoagulated   . Anxiety   . Arthritis   . Complication of anesthesia    states "I was in [place] for several weeks after surgery"caused by tramadol  . Constipation   . Dysuria 07/28/2016  . Edema 07/24/2016  . Gait abnormality 07/25/2016  . Hard of hearing   . Hip arthritis 07/21/2016  . History of fall 07/25/2016  . Hypercholesteremia   . Hypertension   . Lumbar spinal stenosis   . Memory loss 07/28/2016   mild  . Osteoarthritis of right hip 07/24/2016  . Sleep apnea    cpap  . Spinal stenosis of lumbar region 05/28/2016  . Urinary frequency   . Venous thrombosis of leg 07/24/2016   right gastrocnemius  . Vertigo   . Wears glasses   . Weight loss 04/08/2016  Past Surgical History:  Procedure Laterality Date  . BACK SURGERY  10/17, 80's  . COLONOSCOPY    . ESOPHAGOGASTRODUODENOSCOPY    . LUMBAR LAMINECTOMY/DECOMPRESSION MICRODISCECTOMY Right 05/28/2016   Procedure: Right Lumbar Three-Four Microdiscectomy;  Surgeon: [name], MD;  Location: [place] [facility];  Service: Neurosurgery;  Laterality: Right;  . TOTAL HIP ARTHROPLASTY Right 07/21/2016   Procedure: TOTAL HIP ARTHROPLASTY ANTERIOR APPROACH;  Surgeon: [name], MD;  Location: [facility];  Service: Orthopedics;  Laterality: Right;    reports that he quit smoking about 48 years ago. He has a 4.00  pack-year smoking history. He has never used smokeless tobacco. He reports that he does not drink alcohol or use drugs. Social History   Social History  . Marital status: Married    Spouse name: N/A  . Number of children: N/A  . Years of education: N/A   Occupational History  . Not on file.   Social History Main Topics  . Smoking status: Former Smoker    Packs/day: 1.00    Years: 4.00    Quit date: 07/16/1968  . Smokeless tobacco: Never Used     Comment: quit 50 years ago  . Alcohol use No  . Drug use: No  . Sexual activity: Not Currently    Partners: Female   Other Topics Concern  . Not on file   Social History Narrative   Retired. Prior rehab counselor. Resident at [facility] since about 2001.    Functional Status Survey: Is the patient deaf or have difficulty hearing?: Yes Does the patient have difficulty seeing, even when wearing glasses/contacts?: Yes Does the patient have difficulty concentrating, remembering, or making decisions?: Yes Does the patient have difficulty walking or climbing stairs?: Yes Does the patient have difficulty dressing or bathing?: Yes Does the patient have difficulty doing errands alone such as visiting a doctor's office or shopping?: Yes  Family History  Problem Relation Age of Onset  . Alzheimer's disease Sister   . AAA (abdominal aortic aneurysm) Sister   . Lung cancer Brother   . Memory loss Sister   . Memory loss Sister     Health Maintenance  Topic Date Due  . TETANUS/TDAP  07/25/1952  . ZOSTAVAX  07/25/1993  . PNA vac Low Risk Adult (1 of 2 - PCV13) 07/25/1998  . INFLUENZA VACCINE  04/08/2016    Allergies  Allergen Reactions  . Tramadol Other (See Comments)    "does not eat" LOSS OF APPETITE      Medication List       Accurate as of 07/28/16  3:06 PM. Always use your most recent med list.          apixaban 5 MG Tabs tablet Commonly known as:  ELIQUIS Take 2 tablets (10 mg total) by mouth 2 (two) times daily.     apixaban 5 MG Tabs tablet Commonly known as:  ELIQUIS Take 1 tablet (5 mg total) by mouth 2 (two) times daily. Start taking on:  07/31/2016   atorvastatin 40 MG tablet Commonly known as:  LIPITOR Take 40 mg by mouth daily at 12 noon.   cetirizine 10 MG tablet Commonly known as:  ZYRTEC Take 10 mg by mouth daily as needed for allergies.   EQL COQ10 300 MG Caps Generic drug:  Coenzyme Q10 Take 300 mg by mouth daily.   fluticasone 50 MCG/ACT nasal spray Commonly known as:  FLONASE Place 2 sprays into both nostrils daily.   GLUCOSAMINE CHOND TRIPLE/VIT D PO Take  2 tablets by mouth daily.   HYDROcodone-acetaminophen 10-325 MG tablet Commonly known as:  NORCO Take 1 tablet by mouth every 4 (four) hours as needed (breakthrough pain).   lisinopril 10 MG tablet Commonly known as:  PRINIVIL,ZESTRIL Take 10 mg by mouth daily.   meclizine 25 MG tablet Commonly known as:  ANTIVERT Take 25 mg by mouth 3 (three) times daily as needed (for dizziness). Travel Sickness Tablets   sodium chloride 0.65 % Soln nasal spray Commonly known as:  OCEAN Place 2 sprays into both nostrils as needed for congestion.       Review of Systems  Constitutional: Positive for appetite change, fatigue and unexpected weight change. Negative for activity change and fever.  HENT: Positive for hearing loss. Negative for congestion, ear pain, rhinorrhea, sore throat, tinnitus, trouble swallowing and voice change.   Eyes:       Corrective lenses  Respiratory: Negative for cough, choking, chest tightness, shortness of breath and wheezing.   Cardiovascular: Positive for leg swelling. Negative for chest pain and palpitations.  Gastrointestinal: Positive for constipation. Negative for abdominal distention, abdominal pain, diarrhea and nausea.  Endocrine: Negative for cold intolerance, heat intolerance, polydipsia, polyphagia and polyuria.       Elevation in glucose to 105 mg% recently  Genitourinary: Positive  for dysuria, frequency, penile pain, testicular pain and urgency.       Not incontinent  Musculoskeletal: Positive for arthralgias and gait problem. Negative for back pain, myalgias and neck pain.       Thrombosed vein in the right gastrocnemius on [place] 07/24/16.  Skin: Negative for color change, pallor and rash.  Allergic/Immunologic: Negative.   Neurological: Negative for dizziness, tremors, syncope, speech difficulty, weakness, numbness and headaches.  Hematological: Negative for adenopathy. Does not bruise/bleed easily.       Anemia  Psychiatric/Behavioral: Positive for decreased concentration. Negative for behavioral problems, confusion, hallucinations and sleep disturbance. The patient is nervous/anxious.     Vitals:   07/28/16 1445  BP: (!) 128/56  Pulse: 70  Temp: 98.5 F (36.9 C)  Weight: 197 lb (89.4 kg)  Height: 5\' 10"  (1.778 m)   Body mass index is 28.27 kg/m. Physical Exam  Constitutional: He is oriented to person, place, and time. He appears well-developed and well-nourished. No distress.  HENT:  Right Ear: External ear normal.  Left Ear: External ear normal.  Nose: Nose normal.  Mouth/Throat: Oropharynx is clear and moist. No oropharyngeal exudate.  Eyes: Conjunctivae and EOM are normal. Pupils are equal, round, and reactive to light.  Neck: No JVD present. No tracheal deviation present. No thyromegaly present.  Cardiovascular: Normal rate, regular rhythm and normal heart sounds.  Exam reveals no gallop and no friction rub.   No murmur heard. diminished DP and PT bilaterally  Pulmonary/Chest: No respiratory distress. He has no wheezes. He has no rales. He exhibits no tenderness.  Abdominal: He exhibits no distension and no mass. There is no tenderness.  Musculoskeletal: Normal range of motion. He exhibits edema (2+ bipedal). He exhibits no tenderness.  Surgical wound of the right hip is healing. Has pain in the right knee with movement ata stressing laterally and  medially.  Lymphadenopathy:    He has no cervical adenopathy.  Neurological: He is alert and oriented to person, place, and time. He has normal reflexes. No cranial nerve deficit. Coordination normal.  Skin: No rash noted. No erythema. No pallor.  Psychiatric: He has a normal mood and affect. His behavior is normal. Judgment  and thought content normal.  anxious    Labs reviewed: Basic Metabolic Panel:  Recent Labs  05/28/16 1323 07/16/16 0917 07/22/16 0854  NA 138 140 138  K 3.8 3.9 4.6  CL 105 107 108  CO2 25 27 22   GLUCOSE 86 92 105*  BUN 12 13 12   CREATININE 0.74 0.64 0.72  CALCIUM 9.3 9.5 8.7*   Liver Function Tests:  Recent Labs  05/28/16 1323  AST 17  ALT 11*  ALKPHOS 93  BILITOT 1.1  PROT 6.2*  ALBUMIN 3.5   No results for input(s): LIPASE, AMYLASE in the last 8760 hours. No results for input(s): AMMONIA in the last 8760 hours. CBC:  Recent Labs  05/28/16 1323 07/16/16 0917 07/22/16 0854  WBC 5.3 4.9 8.0  NEUTROABS  --   --  6.2  HGB 11.6* 12.7* 10.8*  HCT 36.0* 39.0 33.3*  MCV 92.1 89.7 90.5  PLT 211 188 168   Cardiac Enzymes: No results for input(s): CKTOTAL, CKMB, CKMBINDEX, TROPONINI in the last 8760 hours. BNP: Invalid input(s): POCBNP No results found for: HGBA1C No results found for: TSH No results found for: VITAMINB12 No results found for: FOLATE No results found for: IRON, TIBC, FERRITIN  Imaging and Procedures obtained prior to SNF admission: No results found.  Assessment/Plan 1. Hip arthritis Post right THA. Engaged in PT and [place] stay  2. Anemia, unspecified type New. Unsure if this is related to prior surgery -CBC, retic count, B12, folate, ferritin  3. Venous thrombosis of leg Continue Eliquis  4. Anticoagulated Continue Eliquis  5. Essential hypertension Controlled. Continue Lisinopril  6. Spinal stenosis of lumbar region, unspecified whether neurogenic claudication present S/P lumbar laminectomy  in Oct. 2017  7. Primary osteoarthritis of right hip S/P right THA  8. History of fall Patient is at risk for further falls  9. Sleep apnea, unspecified type Continue CPAP  10. Gait abnormality Continue PT and use of walker  11. Drug-induced constipation -Senokot-S 3 qhs  12. Weight loss undetermined etilogy  13. Edema, unspecified type -furosemide 20 mg qd  14. Memory loss -Needs MMSE  15. Urinary frequency -UA and culture -referral to urology

## 2016-07-28 NOTE — Telephone Encounter (Signed)
IC Mia and s/w her about this, she is requesting written clarification on this, will discuss with Dr [name] and fax her back.  She also faxed something last week, I do not have it, she will refax it. Fax [phone]

## 2016-07-29 LAB — HEPATIC FUNCTION PANEL
ALT: 12 U/L (ref 10–40)
AST: 14 U/L (ref 14–40)
Alkaline Phosphatase: 71 U/L (ref 25–125)
Bilirubin, Total: 0.7 mg/dL

## 2016-07-29 LAB — CBC AND DIFFERENTIAL
HCT: 31 % — AB (ref 41–53)
HEMOGLOBIN: 10 g/dL — AB (ref 13.5–17.5)
PLATELETS: 281 10*3/uL (ref 150–399)
WBC: 6.4 10*3/mL

## 2016-07-29 LAB — TSH: TSH: 2.44 u[IU]/mL (ref <=5.90)

## 2016-07-29 LAB — BASIC METABOLIC PANEL
BUN: 14 mg/dL (ref 4–21)
Creatinine: 0.7 mg/dL (ref <=1.3)
Glucose: 94 mg/dL
POTASSIUM: 4.8 mmol/L (ref 3.4–5.3)
Sodium: 139 mmol/L (ref 137–147)

## 2016-07-30 ENCOUNTER — Non-Acute Institutional Stay (SKILLED_NURSING_FACILITY): Payer: Medicare Other | Admitting: Nurse Practitioner

## 2016-07-30 ENCOUNTER — Encounter: Payer: Self-pay | Admitting: *Deleted

## 2016-07-30 DIAGNOSIS — I10 Essential (primary) hypertension: Secondary | ICD-10-CM | POA: Diagnosis not present

## 2016-07-30 DIAGNOSIS — R609 Edema, unspecified: Secondary | ICD-10-CM

## 2016-07-30 DIAGNOSIS — D500 Iron deficiency anemia secondary to blood loss (chronic): Secondary | ICD-10-CM

## 2016-07-30 DIAGNOSIS — M48061 Spinal stenosis, lumbar region without neurogenic claudication: Secondary | ICD-10-CM

## 2016-07-30 DIAGNOSIS — R413 Other amnesia: Secondary | ICD-10-CM | POA: Diagnosis not present

## 2016-07-30 DIAGNOSIS — N390 Urinary tract infection, site not specified: Secondary | ICD-10-CM | POA: Diagnosis not present

## 2016-07-30 DIAGNOSIS — R350 Frequency of micturition: Secondary | ICD-10-CM

## 2016-07-30 DIAGNOSIS — R269 Unspecified abnormalities of gait and mobility: Secondary | ICD-10-CM | POA: Diagnosis not present

## 2016-07-30 NOTE — Progress Notes (Signed)
Location:  [facility] Room Number: 27 Place of Service:  SNF (31) Provider:  Kirsta Probert, Manxie  NP  Jeanmarie Hubert, MD  Patient Care Team: [name], MD as PCP - General (Internal Medicine) [name], MD as Consulting Physician (Orthopedic Surgery) [name], MD as Consulting Physician (Gastroenterology) Provider Not In System [name], MD as Consulting Physician (Neurosurgery) [name], MD as Referring Physician (Ophthalmology) Maxxon Schwanke Otho Darner, NP as Nurse Practitioner (Internal Medicine)  Extended Emergency Contact Information Primary Emergency Contact: [name] Address: [address]          [place], [place] [address] [place] Mobile Phone: [phone] Relation: Spouse  Code Status:  Full Code Goals of care: Advanced Directive information Advanced Directives 07/30/2016  Does Patient Have a Medical Advance Directive? -  Type of Advance Directive [place]  Does patient want to make changes to medical advance directive? No - Patient declined  Copy of [place] in Chart? No - copy requested     Chief Complaint  Patient presents with  . Acute Visit    UTI    HPI:  Pt is a 82 y.o. male seen today for an acute visit for UTI, urine culture 07/29/16 P. Aeruginosa, Cipro 500mg  bid x 7 days started   Hx of HTN, controlled Bp, taking Lisinopril 10mg , LLE edema, trace.  Past Medical History:  Diagnosis Date  . Anemia   . Anticoagulated   . Anxiety   . Arthritis   . Complication of anesthesia    states "[place] was [place] weeks after surgery"caused by tramadol  . Constipation   . Dysuria 07/28/2016  . Edema 07/24/2016  . Gait abnormality 07/25/2016  . Hard of hearing   . Hip arthritis 07/21/2016  . History of fall 07/25/2016  . Hypercholesteremia   . Hypertension   . Lumbar spinal stenosis   . Memory loss 07/28/2016   mild    . Osteoarthritis of right hip 07/24/2016  . Sleep apnea    cpap  . Spinal stenosis of lumbar region 05/28/2016  . Urinary frequency   . Venous thrombosis of leg 07/24/2016   right gastrocnemius  . Vertigo   . Wears glasses   . Weight loss 04/08/2016   Past Surgical History:  Procedure Laterality Date  . BACK SURGERY  10/17, 80's  . COLONOSCOPY    . ESOPHAGOGASTRODUODENOSCOPY    . LUMBAR LAMINECTOMY/DECOMPRESSION MICRODISCECTOMY Right 05/28/2016   Procedure: Right Lumbar Three-Four Microdiscectomy;  Surgeon: [name], MD;  Location: [facility];  Service: Neurosurgery;  Laterality: Right;  . TOTAL HIP ARTHROPLASTY Right 07/21/2016   Procedure: TOTAL HIP ARTHROPLASTY ANTERIOR APPROACH;  Surgeon: [name], MD;  Location: [facility];  Service: Orthopedics;  Laterality: Right;    Allergies  Allergen Reactions  . Tramadol Other (See Comments)    "does not eat" LOSS OF APPETITE      Medication List       Accurate as of 07/30/16  1:41 PM. Always use your most recent med list.          apixaban 5 MG Tabs tablet Commonly known as:  ELIQUIS Take 2 tablets (10 mg total) by mouth 2 (two) times daily.   apixaban 5 MG Tabs tablet Commonly known as:  ELIQUIS Take 1 tablet (5 mg total) by mouth 2 (two) times daily. Start  taking on:  07/31/2016   atorvastatin 40 MG tablet Commonly known as:  LIPITOR Take 40 mg by mouth daily at 12 noon.   cetirizine 10 MG tablet Commonly known as:  ZYRTEC Take 10 mg by mouth daily as needed for allergies.   ciprofloxacin 500 MG tablet Commonly known as:  CIPRO Take 500 mg by mouth 2 (two) times daily.   EQL COQ10 300 MG Caps Generic drug:  Coenzyme Q10 Take 300 mg by mouth daily.   fluticasone 50 MCG/ACT nasal spray Commonly known as:  FLONASE Place 2 sprays into both nostrils daily.   GLUCOSAMINE CHOND TRIPLE/VIT D PO Take 2 tablets by mouth daily.   HYDROcodone-acetaminophen 10-325 MG tablet Commonly known as:   NORCO Take 1 tablet by mouth every 4 (four) hours as needed (breakthrough pain).   lisinopril 10 MG tablet Commonly known as:  PRINIVIL,ZESTRIL Take 10 mg by mouth daily.   meclizine 25 MG tablet Commonly known as:  ANTIVERT Take 25 mg by mouth 3 (three) times daily as needed (for dizziness). Travel Sickness Tablets   saccharomyces boulardii 250 MG capsule Commonly known as:  FLORASTOR Take 250 mg by mouth 2 (two) times daily.   sodium chloride 0.65 % Soln nasal spray Commonly known as:  OCEAN Place 2 sprays into both nostrils as needed for congestion.       Review of Systems  Constitutional: Positive for appetite change, fatigue and unexpected weight change. Negative for activity change and fever.  HENT: Positive for hearing loss. Negative for congestion, ear pain, rhinorrhea, sore throat, tinnitus, trouble swallowing and voice change.   Eyes:       Corrective lenses  Respiratory: Negative for cough, choking, chest tightness, shortness of breath and wheezing.   Cardiovascular: Positive for leg swelling. Negative for chest pain and palpitations.  Gastrointestinal: Positive for constipation. Negative for abdominal distention, abdominal pain, diarrhea and nausea.  Endocrine: Negative for cold intolerance, heat intolerance, polydipsia, polyphagia and polyuria.       Elevation in glucose to 105 mg% recently  Genitourinary: Positive for dysuria, frequency, penile pain, testicular pain and urgency.       Not incontinent  Musculoskeletal: Positive for arthralgias and gait problem. Negative for back pain, myalgias and neck pain.       Thrombosed vein in the right gastrocnemius on [place] 07/24/16.  Skin: Negative for color change, pallor and rash.  Allergic/Immunologic: Negative.   Neurological: Negative for dizziness, tremors, syncope, speech difficulty, weakness, numbness and headaches.  Hematological: Negative for adenopathy. Does not bruise/bleed easily.       Anemia   Psychiatric/Behavioral: Positive for decreased concentration. Negative for behavioral problems, confusion, hallucinations and sleep disturbance. The patient is nervous/anxious.     Immunization History  Administered Date(s) Administered  . Influenza-Unspecified 06/11/2015  . Pneumococcal Polysaccharide-23 04/21/2011  . Td 04/22/2002  . Zoster 04/27/2012   Pertinent  Health Maintenance Due  Topic Date Due  . PNA vac Low Risk Adult (2 of 2 - PCV13) 04/20/2012  . INFLUENZA VACCINE  04/08/2016   Fall Risk  07/28/2016  Falls in the past year? Yes  Number falls in past yr: 1  Injury with Fall? No  Risk for fall due to : History of fall(s);Impaired balance/gait;Impaired mobility  Follow up Falls evaluation completed   Functional Status Survey:    Vitals:   07/30/16 0923  BP: 128/64  Pulse: 72  Resp: 18  Temp: 97.3 F (36.3 C)  Weight: 190 lb (86.2 kg)  Height:  5\' 10"  (1.778 m)   Body mass index is 27.26 kg/m. Physical Exam  Constitutional: He is oriented to person, place, and time. He appears well-developed and well-nourished. No distress.  HENT:  Right Ear: External ear normal.  Left Ear: External ear normal.  Nose: Nose normal.  Mouth/Throat: Oropharynx is clear and moist. No oropharyngeal exudate.  Eyes: Conjunctivae and EOM are normal. Pupils are equal, round, and reactive to light.  Neck: No JVD present. No tracheal deviation present. No thyromegaly present.  Cardiovascular: Normal rate, regular rhythm and normal heart sounds.  Exam reveals no gallop and no friction rub.   No murmur heard. diminished DP and PT bilaterally  Pulmonary/Chest: No respiratory distress. He has no wheezes. He has no rales. He exhibits no tenderness.  Abdominal: He exhibits no distension and no mass. There is no tenderness.  Musculoskeletal: Normal range of motion. He exhibits edema (2+ bipedal). He exhibits no tenderness.  Surgical wound of the right hip is healing. Has pain in the right  knee with movement ata stressing laterally and medially.  Lymphadenopathy:    He has no cervical adenopathy.  Neurological: He is alert and oriented to person, place, and time. He has normal reflexes. No cranial nerve deficit. Coordination normal.  Skin: No rash noted. No erythema. No pallor.  Psychiatric: He has a normal mood and affect. His behavior is normal. Judgment and thought content normal.  anxious    Labs reviewed:  Recent Labs  05/28/16 1323 07/16/16 0917 07/22/16 0854 07/29/16  NA 138 140 138 139  K 3.8 3.9 4.6 4.8  CL 105 107 108  --   CO2 25 27 22   --   GLUCOSE 86 92 105*  --   BUN 12 13 12 14   CREATININE 0.74 0.64 0.72 0.7  CALCIUM 9.3 9.5 8.7*  --     Recent Labs  05/28/16 1323 07/29/16  AST 17 14  ALT 11* 12  ALKPHOS 93 71  BILITOT 1.1  --   PROT 6.2*  --   ALBUMIN 3.5  --     Recent Labs  05/28/16 1323 07/16/16 0917 07/22/16 0854 07/29/16  WBC 5.3 4.9 8.0 6.4  NEUTROABS  --   --  6.2  --   HGB 11.6* 12.7* 10.8* 10.0*  HCT 36.0* 39.0 33.3* 31*  MCV 92.1 89.7 90.5  --   PLT 211 188 168 281   Lab Results  Component Value Date   TSH 2.44 07/29/2016   No results found for: HGBA1C No results found for: CHOL, HDL, LDLCALC, LDLDIRECT, TRIG, CHOLHDL  Significant Diagnostic Results in last 30 days:  Dg C-arm Gt 120 Min  Result Date: 07/21/2016 CLINICAL DATA:  Anterior approach right hip arthroplasty. Reported fluoro time is 50 seconds EXAM: DG C-ARM GT 120 MIN; OPERATIVE RIGHT HIP WITH PELVIS CONTRAST:  None FLUOROSCOPY TIME:  Fluoroscopy Time:  50 seconds Number of Acquired Spot Images: 3 COMPARISON:  None available in PACs FINDINGS: 3 fluoro spot images reveal the patient to of undergone total right hip arthroplasty. Radiographic positioning of the prosthetic components is good. The native bone exhibits no acute abnormality. The overlying soft tissues are unremarkable. IMPRESSION: Status post anterior approach right total hip arthroplasty. No  immediate postprocedure complication. Electronically Signed   By: David  Martinique M.D.   On: 07/21/2016 11:10   Dg Hip Port Unilat With Pelvis 1v Right  Result Date: 07/21/2016 CLINICAL DATA:  Post- operative right total hip arthroplasty. EXAM: DG HIP (WITH OR  WITHOUT PELVIS) 1V PORT RIGHT COMPARISON:  Intraoperative radiographic series of today's date FINDINGS: The prosthetic hip joint appears to be in reasonable position. The interface with the native bone is normal. The native bone exhibits no acute abnormality. The surrounding soft tissues are normal. IMPRESSION: There is no postprocedure complication following right total hip joint replacement. Electronically Signed   By: David  Martinique M.D.   On: 07/21/2016 12:38   Dg Hip Operative Unilat With Pelvis Right  Result Date: 07/21/2016 CLINICAL DATA:  Anterior approach right hip arthroplasty. Reported fluoro time is 50 seconds EXAM: DG C-ARM GT 120 MIN; OPERATIVE RIGHT HIP WITH PELVIS CONTRAST:  None FLUOROSCOPY TIME:  Fluoroscopy Time:  50 seconds Number of Acquired Spot Images: 3 COMPARISON:  None available in PACs FINDINGS: 3 fluoro spot images reveal the patient to of undergone total right hip arthroplasty. Radiographic positioning of the prosthetic components is good. The native bone exhibits no acute abnormality. The overlying soft tissues are unremarkable. IMPRESSION: Status post anterior approach right total hip arthroplasty. No immediate postprocedure complication. Electronically Signed   By: David  Martinique M.D.   On: 07/21/2016 11:10   Xr Hip Unilat W Or W/o Pelvis 2-3 Views Right  Result Date: 07/04/2016 2 views right hip ordered and obtained shows endstage arthritis in the right hip with some collapse of the femoral head.  Left hip on the AP pelvis has a maintained joint space   Assessment/Plan UTI (urinary tract infection) urine culture 07/29/16 P. Aeruginosa, Cipro 500mg  bid x 7 days started    Anemia 07/29/16 Hgb 10.0, 07/29/16  Na 139, K 4.8, Bun 14,m creat 0.64, wbc 6.4, Hgb 10.0, Plt 281, TSH 2.44, B12 267, Folate 9.1   Gait abnormality W/c to go further  Edema LLE trace  Memory loss 07/28/16 MMSE 24/30  Spinal stenosis of lumbar region Continue prn Norco  Hypertension Controlled, continue Lisinopril 10mg , 07/29/16 Na 139, K 4.8, Bun 14,m creat 0.64, wbc 6.4, Hgb 10.0, Plt 281, TSH 2.44, B12 267, Folate 9.1  Urinary frequency F/u Urology     Family/ staff Communication: SNF Labs/tests ordered:  none

## 2016-07-30 NOTE — Assessment & Plan Note (Signed)
LLE trace

## 2016-07-30 NOTE — Assessment & Plan Note (Signed)
Controlled, continue Lisinopril 10mg , 07/29/16 Na 139, K 4.8, Bun 14,m creat 0.64, wbc 6.4, Hgb 10.0, Plt 281, TSH 2.44, B12 267, Folate 9.1

## 2016-07-30 NOTE — Assessment & Plan Note (Signed)
urine culture 07/29/16 P. Aeruginosa, Cipro 500mg  bid x 7 days started

## 2016-07-30 NOTE — Assessment & Plan Note (Addendum)
07/29/16 Hgb 10.0, 07/29/16 Na 139, K 4.8, Bun 14,m creat 0.64, wbc 6.4, Hgb 10.0, Plt 281, TSH 2.44, B12 267, Folate 9.1

## 2016-07-30 NOTE — Assessment & Plan Note (Signed)
07/28/16 MMSE 24/30

## 2016-07-30 NOTE — Assessment & Plan Note (Signed)
F/u Urology  

## 2016-07-30 NOTE — Assessment & Plan Note (Signed)
Continue prn Norco

## 2016-07-30 NOTE — Assessment & Plan Note (Signed)
W/c to go further

## 2016-08-04 ENCOUNTER — Ambulatory Visit (INDEPENDENT_AMBULATORY_CARE_PROVIDER_SITE_OTHER): Payer: Medicare Other | Admitting: Orthopedic Surgery

## 2016-08-04 ENCOUNTER — Encounter (INDEPENDENT_AMBULATORY_CARE_PROVIDER_SITE_OTHER): Payer: Self-pay | Admitting: Orthopedic Surgery

## 2016-08-04 ENCOUNTER — Ambulatory Visit (INDEPENDENT_AMBULATORY_CARE_PROVIDER_SITE_OTHER): Payer: Medicare Other

## 2016-08-04 ENCOUNTER — Inpatient Hospital Stay (INDEPENDENT_AMBULATORY_CARE_PROVIDER_SITE_OTHER): Payer: Medicare Other | Admitting: Orthopedic Surgery

## 2016-08-04 ENCOUNTER — Encounter (INDEPENDENT_AMBULATORY_CARE_PROVIDER_SITE_OTHER): Payer: Self-pay

## 2016-08-04 DIAGNOSIS — Z96641 Presence of right artificial hip joint: Secondary | ICD-10-CM | POA: Diagnosis not present

## 2016-08-04 NOTE — Progress Notes (Signed)
Post-Op Visit Note   Patient: [name]           Date of Birth: August 01, 1931           MRN: [id number] Visit Date: 08/04/2016 PCP: Jeanmarie Hubert, MD   Assessment & Plan:  Chief Complaint:  Chief Complaint  Patient presents with  . Right Hip - Routine Post Op   Visit Diagnoses:  1. Presence of right artificial hip joint     Plan: Patient had a LEEP before being seen today.  In general he is doing well.  There is a little bit of moistness at the proximal aspect of the incision near the skin fold but no evidence of erythema or drainage.  We will see him back in 2 weeks for clinical recheck  Follow-Up Instructions: No Follow-up on file.   Orders:  Orders Placed This Encounter  Procedures  . XR HIP UNILAT W OR W/O PELVIS 2-3 VIEWS RIGHT   No orders of the defined types were placed in this encounter.    PMFS History: Patient Active Problem List   Diagnosis Date Noted  . UTI (urinary tract infection) 07/30/2016  . Memory loss 07/28/2016  . Anemia   . Anticoagulated   . Hypertension   . Lumbar spinal stenosis   . Sleep apnea   . Constipation   . Urinary frequency   . History of fall 07/25/2016  . Gait abnormality 07/25/2016  . Venous thrombosis of leg 07/24/2016  . Osteoarthritis of right hip 07/24/2016  . Edema 07/24/2016  . Hip arthritis 07/21/2016  . Spinal stenosis of lumbar region 05/28/2016  . Weight loss 04/08/2016  . Nuclear sclerosis of both eyes 01/04/2014  . Hypercholesteremia    Past Medical History:  Diagnosis Date  . Anemia   . Anticoagulated   . Anxiety   . Arthritis   . Complication of anesthesia    states "I was [place] several weeks after surgery"caused by tramadol  . Constipation   . Dysuria 07/28/2016  . Edema 07/24/2016  . Gait abnormality 07/25/2016  . Hard of hearing   . Hip arthritis 07/21/2016  . History of fall 07/25/2016  . Hypercholesteremia   . Hypertension   . Lumbar spinal stenosis   . Memory loss  07/28/2016   mild  . Osteoarthritis of right hip 07/24/2016  . Sleep apnea    cpap  . Spinal stenosis of lumbar region 05/28/2016  . Urinary frequency   . Venous thrombosis of leg 07/24/2016   right gastrocnemius  . Vertigo   . Wears glasses   . Weight loss 04/08/2016    Family History  Problem Relation Age of Onset  . Alzheimer's disease Sister   . AAA (abdominal aortic aneurysm) Sister   . Lung cancer Brother   . Memory loss Sister   . Memory loss Sister     Past Surgical History:  Procedure Laterality Date  . BACK SURGERY  10/17, 80's  . COLONOSCOPY    . ESOPHAGOGASTRODUODENOSCOPY    . LUMBAR LAMINECTOMY/DECOMPRESSION MICRODISCECTOMY Right 05/28/2016   Procedure: Right Lumbar Three-Four Microdiscectomy;  Surgeon: [name], MD;  Location: [facility];  Service: Neurosurgery;  Laterality: Right;  . TOTAL HIP ARTHROPLASTY Right 07/21/2016   Procedure: TOTAL HIP ARTHROPLASTY ANTERIOR APPROACH;  Surgeon: [name], MD;  Location: [facility];  Service: Orthopedics;  Laterality: Right;   Social History   Occupational History  . Not on file.   Social History Main Topics  .  Smoking status: Former Smoker    Packs/day: 1.00    Years: 4.00    Quit date: 07/16/1968  . Smokeless tobacco: Never Used     Comment: quit 50 years ago  . Alcohol use No  . Drug use: No  . Sexual activity: Not Currently    Partners: Female

## 2016-08-18 ENCOUNTER — Ambulatory Visit (INDEPENDENT_AMBULATORY_CARE_PROVIDER_SITE_OTHER): Payer: Medicare Other | Admitting: Orthopedic Surgery

## 2016-08-18 ENCOUNTER — Encounter (INDEPENDENT_AMBULATORY_CARE_PROVIDER_SITE_OTHER): Payer: Self-pay | Admitting: Orthopedic Surgery

## 2016-08-18 DIAGNOSIS — Z96641 Presence of right artificial hip joint: Secondary | ICD-10-CM

## 2016-08-18 NOTE — Progress Notes (Signed)
Post-Op Visit Note   Patient: [name]           Date of Birth: 1930-10-01           MRN: [id number] Visit Date: 08/18/2016 PCP: Jeanmarie Hubert, MD   Assessment & Plan:  Chief Complaint:  Chief Complaint  Patient presents with  . Right Hip - Routine Post Op    Surgery 07/21/16   Visit Diagnoses:  1. Presence of right artificial hip joint     Plan: He is a 81 year old patient 4 weeks out right hip replacement generally doing well he is able to walk almost a mile at a friend's home.  Heading back into his apartment 08/22/2016.  On exam the incision is intact and flexor strength is a little weak but it's improving.  Plan is to continue with home exercise program with return office visit 6 weeks for final check  Follow-Up Instructions: No Follow-up on file.   Orders:  No orders of the defined types were placed in this encounter.  No orders of the defined types were placed in this encounter.    PMFS History: Patient Active Problem List   Diagnosis Date Noted  . UTI (urinary tract infection) 07/30/2016  . Memory loss 07/28/2016  . Anemia   . Anticoagulated   . Hypertension   . Lumbar spinal stenosis   . Sleep apnea   . Constipation   . Urinary frequency   . History of fall 07/25/2016  . Gait abnormality 07/25/2016  . Venous thrombosis of leg 07/24/2016  . Osteoarthritis of right hip 07/24/2016  . Edema 07/24/2016  . Hip arthritis 07/21/2016  . Spinal stenosis of lumbar region 05/28/2016  . Weight loss 04/08/2016  . Nuclear sclerosis of both eyes 01/04/2014  . Hypercholesteremia    Past Medical History:  Diagnosis Date  . Anemia   . Anticoagulated   . Anxiety   . Arthritis   . Complication of anesthesia    states "I was in [place] for several weeks after surgery"caused by tramadol  . Constipation   . Dysuria 07/28/2016  . Edema 07/24/2016  . Gait abnormality 07/25/2016  . Hard of hearing   . Hip arthritis 07/21/2016  . History of fall  07/25/2016  . Hypercholesteremia   . Hypertension   . Lumbar spinal stenosis   . Memory loss 07/28/2016   mild  . Osteoarthritis of right hip 07/24/2016  . Sleep apnea    cpap  . Spinal stenosis of lumbar region 05/28/2016  . Urinary frequency   . Venous thrombosis of leg 07/24/2016   right gastrocnemius  . Vertigo   . Wears glasses   . Weight loss 04/08/2016    Family History  Problem Relation Age of Onset  . Alzheimer's disease Sister   . AAA (abdominal aortic aneurysm) Sister   . Lung cancer Brother   . Memory loss Sister   . Memory loss Sister     Past Surgical History:  Procedure Laterality Date  . BACK SURGERY  10/17, 80's  . COLONOSCOPY    . ESOPHAGOGASTRODUODENOSCOPY    . LUMBAR LAMINECTOMY/DECOMPRESSION MICRODISCECTOMY Right 05/28/2016   Procedure: Right Lumbar Three-Four Microdiscectomy;  Surgeon: [name], MD;  Location: [facility];  Service: Neurosurgery;  Laterality: Right;  . TOTAL HIP ARTHROPLASTY Right 07/21/2016   Procedure: TOTAL HIP ARTHROPLASTY ANTERIOR APPROACH;  Surgeon: [name], MD;  Location: [facility];  Service: Orthopedics;  Laterality: Right;   Social History   Occupational  History  . Not on file.   Social History Main Topics  . Smoking status: Former Smoker    Packs/day: 1.00    Years: 4.00    Quit date: 07/16/1968  . Smokeless tobacco: Never Used     Comment: quit 50 years ago  . Alcohol use No  . Drug use: No  . Sexual activity: Not Currently    Partners: Female

## 2016-08-28 ENCOUNTER — Encounter: Payer: Self-pay | Admitting: Nurse Practitioner

## 2016-08-28 NOTE — Progress Notes (Signed)
This encounter was created in error - please disregard.

## 2016-09-19 ENCOUNTER — Ambulatory Visit (INDEPENDENT_AMBULATORY_CARE_PROVIDER_SITE_OTHER): Payer: Medicare Other | Admitting: Orthopedic Surgery

## 2016-09-19 ENCOUNTER — Encounter (INDEPENDENT_AMBULATORY_CARE_PROVIDER_SITE_OTHER): Payer: Self-pay | Admitting: Orthopedic Surgery

## 2016-09-19 DIAGNOSIS — M25562 Pain in left knee: Secondary | ICD-10-CM | POA: Diagnosis not present

## 2016-09-19 MED ORDER — METHYLPREDNISOLONE ACETATE 40 MG/ML IJ SUSP
40.0000 mg | INTRAMUSCULAR | Status: AC | PRN
  Administered 2016-09-19: 40 mg via INTRA_ARTICULAR

## 2016-09-19 MED ORDER — LIDOCAINE HCL 1 % IJ SOLN
5.0000 mL | INTRAMUSCULAR | Status: AC | PRN
  Administered 2016-09-19: 5 mL

## 2016-09-19 MED ORDER — BUPIVACAINE HCL 0.25 % IJ SOLN
4.0000 mL | INTRAMUSCULAR | Status: AC | PRN
  Administered 2016-09-19: 4 mL via INTRA_ARTICULAR

## 2016-09-19 NOTE — Progress Notes (Signed)
Office Visit Note   Patient: [name]           Date of Birth: Nov 19, 1930           MRN: [id number] Visit Date: 09/19/2016 Requested by: [name], MD [address] [place], [place] [address] PCP: Jeanmarie Hubert, MD  Subjective: Chief Complaint  Patient presents with  . Left Knee - Pain    HPI.  A 83-year-old patient with left knee pain.  Been going on for about 1-2 weeks.  Denies any history of injury or trauma.  Localizes pain to the medial side.  Patient states his right hip is doing well.  He traveling to [place] today to visit family because his sister just died.  This is a 5 hour trip.              Review of Systems All systems reviewed are negative as they relate to the chief complaint within the history of present illness.  Patient denies  fevers or chills.    Assessment & Plan: Visit Diagnoses:  1. Acute pain of left knee     Plan: Impression is left knee pain could be occult arthritis or degenerative meniscal tear.  Radiographs show maintenance of the joint space.  On exam there is no effusion.  Like to try an injection today.  Could consider further workup if his symptoms become unbearable.  We will see him back as needed.  Patient tolerated the injection well today.  Follow-Up Instructions: No Follow-up on file.   Orders:  No orders of the defined types were placed in this encounter.  No orders of the defined types were placed in this encounter.     Procedures: Large Joint Inj Date/Time: 09/19/2016 9:11 AM Performed by: [name] Authorized by: [name]   Consent Given by:  Patient Site marked: the procedure site was marked   Timeout: prior to procedure the correct patient, procedure, and site was verified   Indications:  Pain, joint swelling and diagnostic evaluation Location:  Knee Site:  L knee Prep: patient was prepped and draped in usual sterile fashion   Needle Size:  18 G Needle Length:  1.5 inches Approach:   Superolateral Ultrasound Guidance: No   Fluoroscopic Guidance: No   Arthrogram: No   Medications:  5 mL lidocaine 1 %; 4 mL bupivacaine 0.25 %; 40 mg methylPREDNISolone acetate 40 MG/ML Patient tolerance:  Patient tolerated the procedure well with no immediate complications     Clinical Data: No additional findings.  Objective: Vital Signs: There were no vitals taken for this visit.  Physical Exam   Constitutional: Patient appears well-developed HEENT:  Head: Normocephalic Eyes:EOM are normal Neck: Normal range of motion Cardiovascular: Normal rate Pulmonary/chest: Effort normal Neurologic: Patient is alert Skin: Skin is warm Psychiatric: Patient has normal mood and affect    Ortho Exam semination of the left knee demonstrates intact extensor mechanism small abrasion just on the medial aspect of the patella.  Pedal pulses palpable.  Lacks about 5 of full extension.  Has medial joint line tenderness.  Collateral crucial [place] are stable.  No groin pain with internal/external rotation of the left-hand side.  Specialty Comments:  No specialty comments available.  Imaging: No results found.   PMFS History: Patient Active Problem List   Diagnosis Date Noted  . Acute pain of left knee 09/19/2016  . UTI (urinary tract infection) 07/30/2016  . Memory loss 07/28/2016  . Anemia   .  Anticoagulated   . Hypertension   . Lumbar spinal stenosis   . Sleep apnea   . Constipation   . Urinary frequency   . History of fall 07/25/2016  . Gait abnormality 07/25/2016  . Venous thrombosis of leg 07/24/2016  . Osteoarthritis of right hip 07/24/2016  . Edema 07/24/2016  . Hip arthritis 07/21/2016  . Spinal stenosis of lumbar region 05/28/2016  . Weight loss 04/08/2016  . Nuclear sclerosis of both eyes 01/04/2014  . Hypercholesteremia    Past Medical History:  Diagnosis Date  . Anemia   . Anticoagulated   . Anxiety   . Arthritis   . Complication of anesthesia    states "I  was in [place] several weeks after surgery"caused by tramadol  . Constipation   . Dysuria 07/28/2016  . Edema 07/24/2016  . Gait abnormality 07/25/2016  . Hard of hearing   . Hip arthritis 07/21/2016  . History of fall 07/25/2016  . Hypercholesteremia   . Hypertension   . Lumbar spinal stenosis   . Memory loss 07/28/2016   mild  . Osteoarthritis of right hip 07/24/2016  . Sleep apnea    cpap  . Spinal stenosis of lumbar region 05/28/2016  . Urinary frequency   . Venous thrombosis of leg 07/24/2016   right gastrocnemius  . Vertigo   . Wears glasses   . Weight loss 04/08/2016    Family History  Problem Relation Age of Onset  . Alzheimer's disease Sister   . AAA (abdominal aortic aneurysm) Sister   . Lung cancer Brother   . Memory loss Sister   . Memory loss Sister     Past Surgical History:  Procedure Laterality Date  . BACK SURGERY  10/17, 80's  . COLONOSCOPY    . ESOPHAGOGASTRODUODENOSCOPY    . LUMBAR LAMINECTOMY/DECOMPRESSION MICRODISCECTOMY Right 05/28/2016   Procedure: Right Lumbar Three-Four Microdiscectomy;  Surgeon: [name], MD;  Location: [facility];  Service: Neurosurgery;  Laterality: Right;  . TOTAL HIP ARTHROPLASTY Right 07/21/2016   Procedure: TOTAL HIP ARTHROPLASTY ANTERIOR APPROACH;  Surgeon: [name], MD;  Location: [facility];  Service: Orthopedics;  Laterality: Right;   Social History   Occupational History  . Not on file.   Social History Main Topics  . Smoking status: Former Smoker    Packs/day: 1.00    Years: 4.00    Quit date: 07/16/1968  . Smokeless tobacco: Never Used     Comment: quit 50 years ago  . Alcohol use No  . Drug use: No  . Sexual activity: Not Currently    Partners: Female

## 2016-09-22 ENCOUNTER — Telehealth (INDEPENDENT_AMBULATORY_CARE_PROVIDER_SITE_OTHER): Payer: Self-pay | Admitting: Orthopedic Surgery
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
## 2016-09-22 NOTE — Telephone Encounter (Signed)
I think 10 am is fine tomorrow

## 2016-09-22 NOTE — Telephone Encounter (Signed)
I called patient's wife back to let her know that 10am appt tomorrow with [name] will be fine.

## 2016-09-23 ENCOUNTER — Telehealth (INDEPENDENT_AMBULATORY_CARE_PROVIDER_SITE_OTHER): Payer: Self-pay | Admitting: Orthopedic Surgery

## 2016-09-23 ENCOUNTER — Ambulatory Visit (INDEPENDENT_AMBULATORY_CARE_PROVIDER_SITE_OTHER): Payer: Medicare Other | Admitting: Orthopedic Surgery

## 2016-09-23 ENCOUNTER — Encounter (INDEPENDENT_AMBULATORY_CARE_PROVIDER_SITE_OTHER): Payer: Self-pay | Admitting: Orthopedic Surgery

## 2016-09-23 DIAGNOSIS — Z96641 Presence of right artificial hip joint: Secondary | ICD-10-CM

## 2016-09-23 MED ORDER — SILVER SULFADIAZINE 1 % EX CREA
1.0000 | TOPICAL_CREAM | Freq: Every day | CUTANEOUS | 0 refills | Status: DC
Start: 2016-09-23 — End: 2017-05-25

## 2016-09-23 NOTE — Telephone Encounter (Signed)
Patient asked if he can get another handicap placard. Hr could not find the other one. The number to contact patient is [phone]   Note: Patient just left.

## 2016-09-23 NOTE — Progress Notes (Signed)
Post-Op Visit Note   Patient: [name]           Date of Birth: [birth year]           MRN: [id number] Visit Date: 09/23/2016 PCP: Travis Hubert, MD   Assessment & Plan:  Chief Complaint:  Chief Complaint  Patient presents with  . Right Hip - Follow-up   Visit Diagnoses:  1. Presence of right artificial hip joint     Plan: [name] is a patient to have right total hip replacement about 2 months ago.  Has a little spot at the superior aspect of the incision.  This is underneath his pannus.  There is no redness erythema or drainage from this area but it's more of a broad excoriated area that 0.5 x 0.5 7 m.  Grams of Bactroban cream on this daily for about 2 weeks until it heals over and then try to keep it dry with some antifungal cream.  The had been putting peroxide and Betadine on it which we want to hold off on.  Also topical antifungal cream around this area of think is a good idea.  I will see him back in about 4 weeks for clinical recheck but there is no evidence of infection at this time.  Follow-Up Instructions: No Follow-up on file.   Orders:  No orders of the defined types were placed in this encounter.  No orders of the defined types were placed in this encounter.   Imaging: No results found.  PMFS History: Patient Active Problem List   Diagnosis Date Noted  . Presence of right artificial hip joint 09/23/2016  . Acute pain of left knee 09/19/2016  . UTI (urinary tract infection) 07/30/2016  . Memory loss 07/28/2016  . Anemia   . Anticoagulated   . Hypertension   . Lumbar spinal stenosis   . Sleep apnea   . Constipation   . Urinary frequency   . History of fall 07/25/2016  . Gait abnormality 07/25/2016  . Venous thrombosis of leg 07/24/2016  . Osteoarthritis of right hip 07/24/2016  . Edema 07/24/2016  . Hip arthritis 07/21/2016  . Spinal stenosis of lumbar region 05/28/2016  . Weight loss 04/08/2016  . Nuclear sclerosis of both eyes 01/04/2014  .  Hypercholesteremia    Past Medical History:  Diagnosis Date  . Anemia   . Anticoagulated   . Anxiety   . Arthritis   . Complication of anesthesia    states "I was in [place] for several weeks after surgery"caused by tramadol  . Constipation   . Dysuria 07/28/2016  . Edema 07/24/2016  . Gait abnormality 07/25/2016  . Hard of hearing   . Hip arthritis 07/21/2016  . History of fall 07/25/2016  . Hypercholesteremia   . Hypertension   . Lumbar spinal stenosis   . Memory loss 07/28/2016   mild  . Osteoarthritis of right hip 07/24/2016  . Sleep apnea    cpap  . Spinal stenosis of lumbar region 05/28/2016  . Urinary frequency   . Venous thrombosis of leg 07/24/2016   right gastrocnemius  . Vertigo   . Wears glasses   . Weight loss 04/08/2016    Family History  Problem Relation Age of Onset  . Alzheimer's disease Sister   . AAA (abdominal aortic aneurysm) Sister   . Lung cancer Brother   . Memory loss Sister   . Memory loss Sister     Past Surgical History:  Procedure Laterality Date  .  BACK SURGERY  10/17, 80's  . COLONOSCOPY    . ESOPHAGOGASTRODUODENOSCOPY    . LUMBAR LAMINECTOMY/DECOMPRESSION MICRODISCECTOMY Right 05/28/2016   Procedure: Right Lumbar Three-Four Microdiscectomy;  Surgeon: [name], MD;  Location: [facility];  Service: Neurosurgery;  Laterality: Right;  . TOTAL HIP ARTHROPLASTY Right 07/21/2016   Procedure: TOTAL HIP ARTHROPLASTY ANTERIOR APPROACH;  Surgeon: [name], MD;  Location: [facility];  Service: Orthopedics;  Laterality: Right;   Social History   Occupational History  . Not on file.   Social History Main Topics  . Smoking status: Former Smoker    Packs/day: 1.00    Years: 4.00    Quit date: 07/16/1968  . Smokeless tobacco: Never Used     Comment: quit 50 years ago  . Alcohol use No  . Drug use: No  . Sexual activity: Not Currently    Partners: Female

## 2016-09-23 NOTE — Telephone Encounter (Signed)
Form to Dr [name] to sign. Will call patient once ready.

## 2016-09-26 NOTE — Telephone Encounter (Signed)
IC, [name] wife is in hospital, I will mail this to [name].

## 2016-09-29 ENCOUNTER — Ambulatory Visit (INDEPENDENT_AMBULATORY_CARE_PROVIDER_SITE_OTHER): Payer: Medicare Other | Admitting: Orthopedic Surgery

## 2016-10-14 ENCOUNTER — Other Ambulatory Visit (INDEPENDENT_AMBULATORY_CARE_PROVIDER_SITE_OTHER): Payer: Self-pay | Admitting: Orthopedic Surgery

## 2016-10-14 ENCOUNTER — Other Ambulatory Visit: Payer: Self-pay | Admitting: Nurse Practitioner

## 2016-10-14 NOTE — Telephone Encounter (Signed)
Rx request for Eliquis. 

## 2016-10-20 ENCOUNTER — Ambulatory Visit (INDEPENDENT_AMBULATORY_CARE_PROVIDER_SITE_OTHER): Payer: Medicare Other | Admitting: Orthopedic Surgery

## 2016-10-20 ENCOUNTER — Encounter (INDEPENDENT_AMBULATORY_CARE_PROVIDER_SITE_OTHER): Payer: Self-pay | Admitting: Orthopedic Surgery

## 2016-10-20 DIAGNOSIS — Z96641 Presence of right artificial hip joint: Secondary | ICD-10-CM

## 2016-10-20 DIAGNOSIS — M1712 Unilateral primary osteoarthritis, left knee: Secondary | ICD-10-CM

## 2016-10-20 NOTE — Progress Notes (Signed)
Post-Op Visit Note   Patient: [name]           Date of Birth: 1930/12/08           MRN: [id number] Visit Date: 10/20/2016 PCP: Jeanmarie Hubert, MD   Assessment & Plan:  Chief Complaint:  Chief Complaint  Patient presents with  . Right Hip - Routine Post Op  . Left Knee - Pain, Follow-up   Visit Diagnoses:  1. Presence of right artificial hip joint   2. Primary osteoarthritis of left knee     Plan: [name] is an 83 year old patient with left knee pain and he is postop from right total hip replacement.  He's doing well from his right total hip replacement.  His left knee still hurts him a time but the injection done 09/19/2016 helped him.  He's had a little bit of delayed healing of the incision at its proximal aspect near the skin fold.  On exam left knee has no effusion and good range of motion and minimal tenderness.  Right hip has excellent range of motion just about half of a dime-sized area of ulceration at the proximal most aspect of the incision which is immediately within the pannus fold.  There is no induration and erythema fluctuance just looks like an area of irritated skin in the pannus fold.  I will see him back in 8 weeks for clinical recheck on the incision only.  I think the knee is functional enough that he doesn't want to pursue any other intervention for that.  Follow-Up Instructions: Return in about 8 weeks (around 12/15/2016).   Orders:  No orders of the defined types were placed in this encounter.  No orders of the defined types were placed in this encounter.   Imaging: No results found.  PMFS History: Patient Active Problem List   Diagnosis Date Noted  . Presence of right artificial hip joint 09/23/2016  . Acute pain of left knee 09/19/2016  . UTI (urinary tract infection) 07/30/2016  . Memory loss 07/28/2016  . Anemia   . Anticoagulated   . Hypertension   . Lumbar spinal stenosis   . Sleep apnea   . Constipation   . Urinary frequency   .  History of fall 07/25/2016  . Gait abnormality 07/25/2016  . Venous thrombosis of leg 07/24/2016  . Osteoarthritis of right hip 07/24/2016  . Edema 07/24/2016  . Hip arthritis 07/21/2016  . Spinal stenosis of lumbar region 05/28/2016  . Weight loss 04/08/2016  . Nuclear sclerosis of both eyes 01/04/2014  . Hypercholesteremia    Past Medical History:  Diagnosis Date  . Anemia   . Anticoagulated   . Anxiety   . Arthritis   . Complication of anesthesia    states "I was in [place] for several weeks after surgery"caused by tramadol  . Constipation   . Dysuria 07/28/2016  . Edema 07/24/2016  . Gait abnormality 07/25/2016  . Hard of hearing   . Hip arthritis 07/21/2016  . History of fall 07/25/2016  . Hypercholesteremia   . Hypertension   . Lumbar spinal stenosis   . Memory loss 07/28/2016   mild  . Osteoarthritis of right hip 07/24/2016  . Sleep apnea    cpap  . Spinal stenosis of lumbar region 05/28/2016  . Urinary frequency   . Venous thrombosis of leg 07/24/2016   right gastrocnemius  . Vertigo   . Wears glasses   . Weight loss 04/08/2016    Family History  Problem Relation Age of Onset  . Alzheimer's disease Sister   . AAA (abdominal aortic aneurysm) Sister   . Lung cancer Brother   . Memory loss Sister   . Memory loss Sister     Past Surgical History:  Procedure Laterality Date  . BACK SURGERY  10/17, 80's  . COLONOSCOPY    . ESOPHAGOGASTRODUODENOSCOPY    . LUMBAR LAMINECTOMY/DECOMPRESSION MICRODISCECTOMY Right 05/28/2016   Procedure: Right Lumbar Three-Four Microdiscectomy;  Surgeon: [name], MD;  Location: [facility];  Service: Neurosurgery;  Laterality: Right;  . TOTAL HIP ARTHROPLASTY Right 07/21/2016   Procedure: TOTAL HIP ARTHROPLASTY ANTERIOR APPROACH;  Surgeon: [name], MD;  Location: [facility];  Service: Orthopedics;  Laterality: Right;   Social History   Occupational History  . Not on file.   Social History Main Topics  . Smoking  status: Former Smoker    Packs/day: 1.00    Years: 4.00    Quit date: 07/16/1968  . Smokeless tobacco: Never Used     Comment: quit 50 years ago  . Alcohol use No  . Drug use: No  . Sexual activity: Not Currently    Partners: Female

## 2016-10-21 ENCOUNTER — Ambulatory Visit (INDEPENDENT_AMBULATORY_CARE_PROVIDER_SITE_OTHER): Payer: Medicare Other | Admitting: Orthopedic Surgery

## 2016-10-22 ENCOUNTER — Ambulatory Visit (INDEPENDENT_AMBULATORY_CARE_PROVIDER_SITE_OTHER): Payer: Medicare Other | Admitting: Orthopedic Surgery

## 2016-10-24 ENCOUNTER — Ambulatory Visit (INDEPENDENT_AMBULATORY_CARE_PROVIDER_SITE_OTHER): Payer: Medicare Other | Admitting: Orthopedic Surgery

## 2016-11-14 ENCOUNTER — Other Ambulatory Visit (INDEPENDENT_AMBULATORY_CARE_PROVIDER_SITE_OTHER): Payer: Self-pay | Admitting: Orthopedic Surgery

## 2016-11-18 ENCOUNTER — Other Ambulatory Visit: Payer: Self-pay | Admitting: Nurse Practitioner

## 2016-11-18 NOTE — Telephone Encounter (Signed)
IC and advised per Dr [name] to call PCP to continue management of this.

## 2016-11-18 NOTE — Telephone Encounter (Signed)
IC pt LMVM to call me to discuss.

## 2016-12-02 ENCOUNTER — Ambulatory Visit (INDEPENDENT_AMBULATORY_CARE_PROVIDER_SITE_OTHER): Payer: Medicare Other | Admitting: Orthopaedic Surgery

## 2016-12-02 ENCOUNTER — Encounter (INDEPENDENT_AMBULATORY_CARE_PROVIDER_SITE_OTHER): Payer: Self-pay | Admitting: Orthopaedic Surgery

## 2016-12-02 DIAGNOSIS — M1611 Unilateral primary osteoarthritis, right hip: Secondary | ICD-10-CM | POA: Diagnosis not present

## 2016-12-02 DIAGNOSIS — M1711 Unilateral primary osteoarthritis, right knee: Secondary | ICD-10-CM | POA: Diagnosis not present

## 2016-12-02 MED ORDER — MUPIROCIN 2 % EX OINT: 1.0000 "application " | TOPICAL_OINTMENT | Freq: Two times a day (BID) | CUTANEOUS | 0 refills | Status: DC

## 2016-12-02 NOTE — Progress Notes (Signed)
Office Visit Note   Patient: [name]           Date of Birth: [birth year]           MRN: [id number] Visit Date: 12/02/2016              Requested by: [name], MD [address] [place], [place] [address] PCP: Jeanmarie Hubert, MD   Assessment & Plan: Visit Diagnoses:  1. Primary osteoarthritis of right hip   2. Unilateral primary osteoarthritis, right knee     Plan: I recommend Bactroban ointment twice a day with a Band-Aid until healed. Right knee injection was performed. Follow-up with me as needed.  Follow-Up Instructions: Return if symptoms worsen or fail to improve.   Orders:  No orders of the defined types were placed in this encounter.  Meds ordered this encounter  Medications  . mupirocin ointment (BACTROBAN) 2 %    Sig: Place 1 application into the nose 2 (two) times daily.    Dispense:  22 g    Refill:  0      Procedures: Large Joint Inj Date/Time: 12/02/2016 1:34 PM Performed by: Leandrew Koyanagi Authorized by: Leandrew Koyanagi   Consent Given by:  Patient Timeout: prior to procedure the correct patient, procedure, and site was verified   Indications:  Pain Location:  Knee Site:  R knee Prep: patient was prepped and draped in usual sterile fashion   Needle Size:  22 G Ultrasound Guidance: No   Fluoroscopic Guidance: No   Arthrogram: No   Patient tolerance:  Patient tolerated the procedure well with no immediate complications     Clinical Data: No additional findings.   Subjective: Chief Complaint  Patient presents with  . Right Hip - Follow-up, Pain  . Right Knee - Pain, Follow-up    Patient comes in with a superficial wound at the top of his hip incision for a day. Denies any constitutional symptoms or pain with the hip. His right knee is bothering him. He has known osteoarthritis.    Review of Systems  Constitutional: Negative.   All other systems reviewed and are negative.    Objective: Vital Signs: There were no vitals  taken for this visit.  Physical Exam  Constitutional: He is oriented to person, place, and time. He appears well-developed and well-nourished.  Pulmonary/Chest: Effort normal.  Abdominal: Soft.  Neurological: He is alert and oriented to person, place, and time.  Skin: Skin is warm.  Psychiatric: He has a normal mood and affect. His behavior is normal. Judgment and thought content normal.  Nursing note and vitals reviewed.   Ortho Exam Right hip exam shows painless range of motion of the hip. He has a very superficial wound on the top corner of the incision in the groin crease. There is no signs of infection. This just involves the epidermal layer.  Right knee exam shows trace effusion. No warmth no swelling or signs of infection. Specialty Comments:  No specialty comments available.  Imaging: No results found.   PMFS History: Patient Active Problem List   Diagnosis Date Noted  . Unilateral primary osteoarthritis, right knee 12/02/2016  . Presence of right artificial hip joint 09/23/2016  . Acute pain of left knee 09/19/2016  . UTI (urinary tract infection) 07/30/2016  . Memory loss 07/28/2016  . Anemia   . Anticoagulated   . Hypertension   . Lumbar spinal stenosis   . Sleep apnea   . Constipation   .  Urinary frequency   . History of fall 07/25/2016  . Gait abnormality 07/25/2016  . Venous thrombosis of leg 07/24/2016  . Osteoarthritis of right hip 07/24/2016  . Edema 07/24/2016  . Hip arthritis 07/21/2016  . Spinal stenosis of lumbar region 05/28/2016  . Weight loss 04/08/2016  . Nuclear sclerosis of both eyes 01/04/2014  . Hypercholesteremia    Past Medical History:  Diagnosis Date  . Anemia   . Anticoagulated   . Anxiety   . Arthritis   . Complication of anesthesia    states "I was in [place] for several weeks after surgery"caused by tramadol  . Constipation   . Dysuria 07/28/2016  . Edema 07/24/2016  . Gait abnormality 07/25/2016  . Hard of hearing     . Hip arthritis 07/21/2016  . History of fall 07/25/2016  . Hypercholesteremia   . Hypertension   . Lumbar spinal stenosis   . Memory loss 07/28/2016   mild  . Osteoarthritis of right hip 07/24/2016  . Sleep apnea    cpap  . Spinal stenosis of lumbar region 05/28/2016  . Urinary frequency   . Venous thrombosis of leg 07/24/2016   right gastrocnemius  . Vertigo   . Wears glasses   . Weight loss 04/08/2016    Family History  Problem Relation Age of Onset  . Alzheimer's disease Sister   . AAA (abdominal aortic aneurysm) Sister   . Lung cancer Brother   . Memory loss Sister   . Memory loss Sister     Past Surgical History:  Procedure Laterality Date  . BACK SURGERY  10/17, 80's  . COLONOSCOPY    . ESOPHAGOGASTRODUODENOSCOPY    . LUMBAR LAMINECTOMY/DECOMPRESSION MICRODISCECTOMY Right 05/28/2016   Procedure: Right Lumbar Three-Four Microdiscectomy;  Surgeon: [name], MD;  Location: [facility];  Service: Neurosurgery;  Laterality: Right;  . TOTAL HIP ARTHROPLASTY Right 07/21/2016   Procedure: TOTAL HIP ARTHROPLASTY ANTERIOR APPROACH;  Surgeon: [name], MD;  Location: [facility];  Service: Orthopedics;  Laterality: Right;   Social History   Occupational History  . Not on file.   Social History Main Topics  . Smoking status: Former Smoker    Packs/day: 1.00    Years: 4.00    Quit date: 07/16/1968  . Smokeless tobacco: Never Used     Comment: quit 50 years ago  . Alcohol use No  . Drug use: No  . Sexual activity: Not Currently    Partners: Female

## 2016-12-15 ENCOUNTER — Ambulatory Visit (INDEPENDENT_AMBULATORY_CARE_PROVIDER_SITE_OTHER): Payer: Medicare Other | Admitting: Orthopedic Surgery

## 2016-12-15 ENCOUNTER — Encounter (INDEPENDENT_AMBULATORY_CARE_PROVIDER_SITE_OTHER): Payer: Self-pay | Admitting: Orthopedic Surgery

## 2016-12-15 DIAGNOSIS — M25562 Pain in left knee: Secondary | ICD-10-CM

## 2016-12-15 DIAGNOSIS — G8929 Other chronic pain: Secondary | ICD-10-CM | POA: Diagnosis not present

## 2016-12-16 NOTE — Progress Notes (Signed)
Office Visit Note   Patient: [name]           Date of Birth: [birth year]           MRN: [id number] Visit Date: 12/15/2016 Requested by: [name], MD [address] [place], [place] [address] PCP: Jeanmarie Hubert, MD  Subjective: Chief Complaint  Patient presents with  . Left Hip - Follow-up  . Left Knee - Pain    HPI: .  He is an 83 year old patient who is now 4 months out right total hip replacement .  He's been doing well.  Reports decreasing pain and decreasing swelling.  He's interested in potentially having his left knee replaced.  Steroid injection has helped his left knee in the past.  He's doing well with powder in that right hip area.           ROS: All systems reviewed are negative as they relate to the chief complaint within the history of present illness.  Patient denies  fevers or chills.   Assessment & Plan: Visit Diagnoses: No diagnosis found.  Plan: Impression is left knee arthritis.  I think I'll hold off on knee replacement fire him for now.  I'll get periodic steroid injections into the knee to try to help with the pain.  The proximal aspect of the hip incision is looking good.  He'll continue to keep that area dry.  I'll see him back as needed  Follow-Up Instructions: No Follow-up on file.   Orders:  No orders of the defined types were placed in this encounter.  No orders of the defined types were placed in this encounter.     Procedures: No procedures performed   Clinical Data: No additional findings.  Objective: Vital Signs: There were no vitals taken for this visit.  Physical Exam:   Constitutional: Patient appears well-developed HEENT:  Head: Normocephalic Eyes:EOM are normal Neck: Normal range of motion Cardiovascular: Normal rate Pulmonary/chest: Effort normal Neurologic: Patient is alert Skin: Skin is warm Psychiatric: Patient has normal mood and affect    Ortho Exam: Orthopedic exam demonstrates left knee has mild  effusion and exits mechanism about a 5-7 flexion contracture stable collateral crucial ligaments palpable pedal pulses no other masses lymph adenopathy or skin changes noted in the left knee region.  On that right hip he's had good range of motion the incision looks intact.  Especially proximally where it's in that skin fold where his pannus hangs over.  No evidence of any type of infection or drainage.  Specialty Comments:  No specialty comments available.  Imaging: No results found.   PMFS History: Patient Active Problem List   Diagnosis Date Noted  . Unilateral primary osteoarthritis, right knee 12/02/2016  . Presence of right artificial hip joint 09/23/2016  . Acute pain of left knee 09/19/2016  . UTI (urinary tract infection) 07/30/2016  . Memory loss 07/28/2016  . Anemia   . Anticoagulated   . Hypertension   . Lumbar spinal stenosis   . Sleep apnea   . Constipation   . Urinary frequency   . History of fall 07/25/2016  . Gait abnormality 07/25/2016  . Venous thrombosis of leg 07/24/2016  . Osteoarthritis of right hip 07/24/2016  . Edema 07/24/2016  . Hip arthritis 07/21/2016  . Spinal stenosis of lumbar region 05/28/2016  . Weight loss 04/08/2016  . Nuclear sclerosis of both eyes 01/04/2014  . Hypercholesteremia    Past Medical History:  Diagnosis Date  . Anemia   .  Anticoagulated   . Anxiety   . Arthritis   . Complication of anesthesia    states "I was [place] several weeks after surgery"caused by tramadol  . Constipation   . Dysuria 07/28/2016  . Edema 07/24/2016  . Gait abnormality 07/25/2016  . Hard of hearing   . Hip arthritis 07/21/2016  . History of fall 07/25/2016  . Hypercholesteremia   . Hypertension   . Lumbar spinal stenosis   . Memory loss 07/28/2016   mild  . Osteoarthritis of right hip 07/24/2016  . Sleep apnea    cpap  . Spinal stenosis of lumbar region 05/28/2016  . Urinary frequency   . Venous thrombosis of leg 07/24/2016    right gastrocnemius  . Vertigo   . Wears glasses   . Weight loss 04/08/2016    Family History  Problem Relation Age of Onset  . Alzheimer's disease Sister   . AAA (abdominal aortic aneurysm) Sister   . Lung cancer Brother   . Memory loss Sister   . Memory loss Sister     Past Surgical History:  Procedure Laterality Date  . BACK SURGERY  10/17, 80's  . COLONOSCOPY    . ESOPHAGOGASTRODUODENOSCOPY    . LUMBAR LAMINECTOMY/DECOMPRESSION MICRODISCECTOMY Right 05/28/2016   Procedure: Right Lumbar Three-Four Microdiscectomy;  Surgeon: [name], MD;  Location: [facility];  Service: Neurosurgery;  Laterality: Right;  . TOTAL HIP ARTHROPLASTY Right 07/21/2016   Procedure: TOTAL HIP ARTHROPLASTY ANTERIOR APPROACH;  Surgeon: [name], MD;  Location: [facility];  Service: Orthopedics;  Laterality: Right;   Social History   Occupational History  . Not on file.   Social History Main Topics  . Smoking status: Former Smoker    Packs/day: 1.00    Years: 4.00    Quit date: 07/16/1968  . Smokeless tobacco: Never Used     Comment: quit 50 years ago  . Alcohol use No  . Drug use: No  . Sexual activity: Not Currently    Partners: Female

## 2017-02-25 ENCOUNTER — Ambulatory Visit (INDEPENDENT_AMBULATORY_CARE_PROVIDER_SITE_OTHER): Payer: Medicare Other | Admitting: Orthopedic Surgery

## 2017-02-25 ENCOUNTER — Encounter (INDEPENDENT_AMBULATORY_CARE_PROVIDER_SITE_OTHER): Payer: Self-pay | Admitting: Orthopedic Surgery

## 2017-02-25 DIAGNOSIS — G8929 Other chronic pain: Secondary | ICD-10-CM | POA: Diagnosis not present

## 2017-02-25 DIAGNOSIS — M25562 Pain in left knee: Secondary | ICD-10-CM

## 2017-02-25 NOTE — Progress Notes (Signed)
Office Visit Note   Patient: [name]           Date of Birth: 05/27/1931           MRN: [id number] Visit Date: 02/25/2017 Requested by: Travis Dooms, MD [address] [place], [place] [address] PCP: Travis Dooms, MD  Subjective: Chief Complaint  Patient presents with  . Left Knee - Follow-up    HPI: [name] is a patient here for bilateral leg swelling.  He is wearing compression stockings.  They're helping him to decrease the edema in his legs.  By his report he is taking appropriate medication.  He's also had some left knee pain and was considering knee replacement.  He has had Synvisc in his knees as well as other injections.  Patient states his left knee is stiff after sitting but in general the pain that he had several months ago has improved.  He is using bilateral canes.  He's here with his wife today.  His hip is doing well.              ROS: All systems reviewed are negative as they relate to the chief complaint within the history of present illness.  Patient denies  fevers or chills.   Assessment & Plan: Visit Diagnoses:  1. Chronic pain of left knee     Plan: Impression is left knee pain improved with injections.  We talked about water walking for exercise which I think will be a great avenue for physical fitness for [facility].  He has 1+ edema in his legs but the compression stockings which are not typical stockings are helping.  His knee has excellent range of motion on the left and right hand side with no swelling.  I'm going to see him back as needed  Follow-Up Instructions: Return if symptoms worsen or fail to improve.   Orders:  No orders of the defined types were placed in this encounter.  No orders of the defined types were placed in this encounter.     Procedures: No procedures performed   Clinical Data: No additional findings.  Objective: Vital Signs: There were no vitals taken for this visit.  Physical Exam:   Constitutional: Patient  appears well-developed HEENT:  Head: Normocephalic Eyes:EOM are normal Neck: Normal range of motion Cardiovascular: Normal rate Pulmonary/chest: Effort normal Neurologic: Patient is alert Skin: Skin is warm Psychiatric: Patient has normal mood and affect    Ortho Exam: Orthopedic exam demonstrates pretty normal gait alignment 1+ pitting edema bilateral lower chemise with palpable pedal pulses sensate foot that good ankle dorsi flexion plantar flexion strength good knee and quite hamstring strength.  No groin pain with internal and external rotation of either leg.  No real focal joint line tenderness on the left knee.  No other masses lymph adenopathy or skin changes noted in the left knee region  Specialty Comments:  No specialty comments available.  Imaging: No results found.   PMFS History: Patient Active Problem List   Diagnosis Date Noted  . Unilateral primary osteoarthritis, right knee 12/02/2016  . Presence of right artificial hip joint 09/23/2016  . Acute pain of left knee 09/19/2016  . UTI (urinary tract infection) 07/30/2016  . Memory loss 07/28/2016  . Anemia   . Anticoagulated   . Hypertension   . Lumbar spinal stenosis   . Sleep apnea   . Constipation   . Urinary frequency   . History of fall 07/25/2016  . Gait abnormality  07/25/2016  . Venous thrombosis of leg 07/24/2016  . Osteoarthritis of right hip 07/24/2016  . Edema 07/24/2016  . Hip arthritis 07/21/2016  . Spinal stenosis of lumbar region 05/28/2016  . Weight loss 04/08/2016  . Nuclear sclerosis of both eyes 01/04/2014  . Hypercholesteremia    Past Medical History:  Diagnosis Date  . Anemia   . Anticoagulated   . Anxiety   . Arthritis   . Complication of anesthesia    states "I was in [place] several weeks after surgery"caused by tramadol  . Constipation   . Dysuria 07/28/2016  . Edema 07/24/2016  . Gait abnormality 07/25/2016  . Hard of hearing   . Hip arthritis 07/21/2016  .  History of fall 07/25/2016  . Hypercholesteremia   . Hypertension   . Lumbar spinal stenosis   . Memory loss 07/28/2016   mild  . Osteoarthritis of right hip 07/24/2016  . Sleep apnea    cpap  . Spinal stenosis of lumbar region 05/28/2016  . Urinary frequency   . Venous thrombosis of leg 07/24/2016   right gastrocnemius  . Vertigo   . Wears glasses   . Weight loss 04/08/2016    Family History  Problem Relation Age of Onset  . Alzheimer's disease Sister   . AAA (abdominal aortic aneurysm) Sister   . Lung cancer Brother   . Memory loss Sister   . Memory loss Sister     Past Surgical History:  Procedure Laterality Date  . BACK SURGERY  10/17, 80's  . COLONOSCOPY    . ESOPHAGOGASTRODUODENOSCOPY    . LUMBAR LAMINECTOMY/DECOMPRESSION MICRODISCECTOMY Right 05/28/2016   Procedure: Right Lumbar Three-Four Microdiscectomy;  Surgeon: [name], MD;  Location: [facility];  Service: Neurosurgery;  Laterality: Right;  . TOTAL HIP ARTHROPLASTY Right 07/21/2016   Procedure: TOTAL HIP ARTHROPLASTY ANTERIOR APPROACH;  Surgeon: [name], MD;  Location: [facility];  Service: Orthopedics;  Laterality: Right;   Social History   Occupational History  . Not on file.   Social History Main Topics  . Smoking status: Former Smoker    Packs/day: 1.00    Years: 4.00    Quit date: 07/16/1968  . Smokeless tobacco: Never Used     Comment: quit 50 years ago  . Alcohol use No  . Drug use: No  . Sexual activity: Not Currently    Partners: Female

## 2017-05-25 ENCOUNTER — Emergency Department (HOSPITAL_COMMUNITY): Payer: Medicare Other

## 2017-05-25 ENCOUNTER — Encounter (HOSPITAL_COMMUNITY): Payer: Self-pay | Admitting: Emergency Medicine

## 2017-05-25 ENCOUNTER — Emergency Department (HOSPITAL_COMMUNITY)
Admission: EM | Admit: 2017-05-25 | Discharge: 2017-05-25 | Disposition: A | Discharge status: Home / Self Care | Payer: Medicare Other | Attending: Emergency Medicine | Admitting: Emergency Medicine

## 2017-05-25 DIAGNOSIS — Z7901 Long term (current) use of anticoagulants: Secondary | ICD-10-CM | POA: Insufficient documentation

## 2017-05-25 DIAGNOSIS — Y998 Other external cause status: Secondary | ICD-10-CM | POA: Diagnosis not present

## 2017-05-25 DIAGNOSIS — W01198A Fall on same level from slipping, tripping and stumbling with subsequent striking against other object, initial encounter: Secondary | ICD-10-CM | POA: Diagnosis not present

## 2017-05-25 DIAGNOSIS — S0101XA Laceration without foreign body of scalp, initial encounter: Secondary | ICD-10-CM | POA: Diagnosis not present

## 2017-05-25 DIAGNOSIS — Z79899 Other long term (current) drug therapy: Secondary | ICD-10-CM | POA: Diagnosis not present

## 2017-05-25 DIAGNOSIS — R42 Dizziness and giddiness: Secondary | ICD-10-CM | POA: Diagnosis not present

## 2017-05-25 DIAGNOSIS — Z87891 Personal history of nicotine dependence: Secondary | ICD-10-CM | POA: Diagnosis not present

## 2017-05-25 DIAGNOSIS — Y9389 Activity, other specified: Secondary | ICD-10-CM | POA: Insufficient documentation

## 2017-05-25 DIAGNOSIS — S0003XA Contusion of scalp, initial encounter: Secondary | ICD-10-CM | POA: Diagnosis not present

## 2017-05-25 DIAGNOSIS — I10 Essential (primary) hypertension: Secondary | ICD-10-CM | POA: Insufficient documentation

## 2017-05-25 DIAGNOSIS — Y92481 Parking lot as the place of occurrence of the external cause: Secondary | ICD-10-CM | POA: Diagnosis not present

## 2017-05-25 DIAGNOSIS — W19XXXA Unspecified fall, initial encounter: Secondary | ICD-10-CM

## 2017-05-25 LAB — COMPREHENSIVE METABOLIC PANEL
ALT: 19 U/L (ref 17–63)
ANION GAP: 6 (ref 5–15)
AST: 24 U/L (ref 15–41)
Albumin: 3.7 g/dL (ref 3.5–5.0)
Alkaline Phosphatase: 87 U/L (ref 38–126)
BUN: 16 mg/dL (ref 6–20)
CALCIUM: 9 mg/dL (ref 8.9–10.3)
CO2: 27 mmol/L (ref 22–32)
Chloride: 105 mmol/L (ref 101–111)
Creatinine, Ser: 0.92 mg/dL (ref 0.61–1.24)
GLUCOSE: 105 mg/dL — AB (ref 65–99)
Potassium: 4.2 mmol/L (ref 3.5–5.1)
Sodium: 138 mmol/L (ref 135–145)
TOTAL PROTEIN: 6.3 g/dL — AB (ref 6.5–8.1)
Total Bilirubin: 0.7 mg/dL (ref 0.3–1.2)

## 2017-05-25 LAB — URINALYSIS, ROUTINE W REFLEX MICROSCOPIC
BILIRUBIN URINE: NEGATIVE
GLUCOSE, UA: NEGATIVE mg/dL
Hgb urine dipstick: NEGATIVE
KETONES UR: NEGATIVE mg/dL
Leukocytes, UA: NEGATIVE
Nitrite: NEGATIVE
PH: 7 (ref 5.0–8.0)
Protein, ur: NEGATIVE mg/dL
Specific Gravity, Urine: 1.015 (ref 1.005–1.030)

## 2017-05-25 LAB — CBC
HCT: 39.3 % (ref 39.0–52.0)
HEMOGLOBIN: 12.7 g/dL — AB (ref 13.0–17.0)
MCH: 28.6 pg (ref 26.0–34.0)
MCHC: 32.3 g/dL (ref 30.0–36.0)
MCV: 88.5 fL (ref 78.0–100.0)
Platelets: 168 10*3/uL (ref 150–400)
RBC: 4.44 MIL/uL (ref 4.22–5.81)
RDW: 15.3 % (ref 11.5–15.5)
WBC: 5.4 10*3/uL (ref 4.0–10.5)

## 2017-05-25 LAB — CBG MONITORING, ED: GLUCOSE-CAPILLARY: 107 mg/dL — AB (ref 65–99)

## 2017-05-25 MED ORDER — ONDANSETRON HCL 4 MG/2ML IJ SOLN
4.0000 mg | Freq: Once | INTRAMUSCULAR | Status: AC
  Administered 2017-05-25: 4 mg via INTRAVENOUS

## 2017-05-25 MED ORDER — ONDANSETRON HCL 4 MG/2ML IJ SOLN
INTRAMUSCULAR | Status: AC
  Filled 2017-05-25: qty 2

## 2017-05-25 MED ORDER — MECLIZINE HCL 25 MG PO TABS
25.0000 mg | ORAL_TABLET | Freq: Once | ORAL | Status: AC
  Administered 2017-05-25: 25 mg via ORAL
  Filled 2017-05-25: qty 1

## 2017-05-25 MED ORDER — SODIUM CHLORIDE 0.9 % IV BOLUS (SEPSIS)
500.0000 mL | Freq: Once | INTRAVENOUS | Status: AC
  Administered 2017-05-25: 500 mL via INTRAVENOUS

## 2017-05-25 MED ORDER — MECLIZINE HCL 12.5 MG PO TABS: 12.5000 mg | ORAL_TABLET | Freq: Three times a day (TID) | ORAL | 0 refills | Status: DC | PRN

## 2017-05-25 MED ORDER — LIDOCAINE-EPINEPHRINE (PF) 2 %-1:200000 IJ SOLN
10.0000 mL | Freq: Once | INTRAMUSCULAR | Status: AC
  Administered 2017-05-25: 10 mL
  Filled 2017-05-25: qty 20

## 2017-05-25 NOTE — ED Notes (Signed)
Patient transported to CT 

## 2017-05-25 NOTE — ED Notes (Signed)
ED Provider at bedside for suture 

## 2017-05-25 NOTE — ED Triage Notes (Signed)
[facility]- Pt had a witnessed fall outside of a Drs. Office. Pt Did not have an appointment according to the receptionist and has not been a patient there in years. Pt used to work in the building next to it. No LOC's but we are unsure of his baseline as his story does not match up with why he was there.   No LOC, Large lac to posterior head with hematoma. Bleeding controlled with gauze. Denies blood thinner use but states he takes 81 mg ASA daily. Pt is a resident at [facility]. Clarita Crane)  Vitals signs stable.

## 2017-05-25 NOTE — ED Provider Notes (Signed)
[name] Provider Note   CSN: [id number] Arrival date & time: 05/25/17  1205  History   Chief Complaint Chief Complaint  Patient presents with  . Fall  . Head Laceration  . Altered Mental Status    HPI [name] is a 83 y.o. male.  [name], MD as PCP - General (Internal Medicine) [name], MD as Consulting Physician (Orthopedic Surgery) [name], MD as Consulting Physician (Gastroenterology) Provider Not In System [name], MD as Consulting Physician (Neurosurgery) [name], MD as Referring Physician (Ophthalmology) [name], NP as Nurse Practitioner (Internal Medicine)  Extended Emergency Contact Information Primary Emergency Contact: [name] Address: [address]          [place], [place] [address] [place] Mobile Phone: [phone] Relation: Spouse  HPI  Patient with multiple medical problems that include hypertension, high cholesterol, hx of falls, sleep apnea, former smoke (1969), spinal stenosis, DVT of leg, OSA cpap, hx of memory loss.   Level V caveat- pt seems to have a hard time remembering. He said when he woke up his wife was still in bed but he went to run a few errands. He felt dizzy and had a fall. He reports pain to his head and dizziness, denies any other complaints.  12:56 pm  I spoke with his wife who says he often "almost falls" and complains about being dizzy a lot for several years. No memory problems or blood thinners. They live at [name]. His PCP [name]. She said he was going to get his glasses adjusted.   1:27 pm I went to recheck patient and he now remembers what happened. He is no longer having "foggy brain". He went to go get his glasses's adjusted and when he went to step onto the curb he remembered that he forgot his walker in the car. So he turned around to go get it and felt dizzy and fell hitting his head.  Past  Medical History:  Diagnosis Date  . Anemia   . Anticoagulated   . Anxiety   . Arthritis   . Complication of anesthesia    states "I was in [place] for several weeks after surgery"caused by tramadol  . Constipation   . Dysuria 07/28/2016  . Edema 07/24/2016  . Gait abnormality 07/25/2016  . Hard of hearing   . Hip arthritis 07/21/2016  . History of fall 07/25/2016  . Hypercholesteremia   . Hypertension   . Lumbar spinal stenosis   . Memory loss 07/28/2016   mild  . Osteoarthritis of right hip 07/24/2016  . Sleep apnea    cpap  . Spinal stenosis of lumbar region 05/28/2016  . Urinary frequency   . Venous thrombosis of leg 07/24/2016   right gastrocnemius  . Vertigo   . Wears glasses   . Weight loss 04/08/2016    Patient Active Problem List   Diagnosis Date Noted  . Unilateral primary osteoarthritis, right knee 12/02/2016  . Presence of right artificial hip joint 09/23/2016  . Acute pain of left knee 09/19/2016  . UTI (urinary tract infection) 07/30/2016  . Memory loss 07/28/2016  . Anemia   . Anticoagulated   . Hypertension   . Lumbar spinal stenosis   . Sleep apnea   . Constipation   . Urinary frequency   . History of fall 07/25/2016  . Gait abnormality 07/25/2016  .  Venous thrombosis of leg 07/24/2016  . Osteoarthritis of right hip 07/24/2016  . Edema 07/24/2016  . Hip arthritis 07/21/2016  . Spinal stenosis of lumbar region 05/28/2016  . Weight loss 04/08/2016  . Nuclear sclerosis of both eyes 01/04/2014  . Hypercholesteremia     Past Surgical History:  Procedure Laterality Date  . BACK SURGERY  10/17, 80's  . COLONOSCOPY    . ESOPHAGOGASTRODUODENOSCOPY    . LUMBAR LAMINECTOMY/DECOMPRESSION MICRODISCECTOMY Right 05/28/2016   Procedure: Right Lumbar Three-Four Microdiscectomy;  Surgeon: [name], MD;  Location: [facility];  Service: Neurosurgery;  Laterality: Right;  . TOTAL HIP ARTHROPLASTY Right 07/21/2016   Procedure: TOTAL HIP ARTHROPLASTY  ANTERIOR APPROACH;  Surgeon: [name], MD;  Location: [facility];  Service: Orthopedics;  Laterality: Right;       Home Medications    Prior to Admission medications   Medication Sig Start Date End Date Taking? Authorizing Provider  atorvastatin (LIPITOR) 40 MG tablet Take 40 mg by mouth daily.    Yes [provider]  cetirizine (ZYRTEC) 10 MG tablet Take 10 mg by mouth daily as needed for allergies.   Yes [provider]  lisinopril (PRINIVIL,ZESTRIL) 10 MG tablet Take 10 mg by mouth daily.   Yes [provider]  meclizine (ANTIVERT) 25 MG tablet Take 25 mg by mouth 3 (three) times daily as needed (for dizziness). Travel Sickness Tablets   Yes [provider]  sodium chloride (OCEAN) 0.65 % SOLN nasal spray Place 2 sprays into both nostrils as needed for congestion.   Yes [provider]  tamsulosin (FLOMAX) 0.4 MG CAPS capsule Take 0.4 mg by mouth daily.    Yes [provider]  Coenzyme Q10 (EQL COQ10) 300 MG CAPS Take 300 mg by mouth daily.    [provider]  ELIQUIS 5 MG TABS tablet TAKE 1 TABLET TWICE DAILY. Patient not taking: Reported on 05/25/2017 11/18/16   [name], MD  fluticasone [facility]) 50 MCG/ACT nasal spray Place 2 sprays into both nostrils daily.    [provider]  Gluc-Chonn-MSM-Boswellia-Vit D (GLUCOSAMINE CHOND TRIPLE/VIT D PO) Take 2 tablets by mouth daily.    [provider]  HYDROcodone-acetaminophen (NORCO) 10-325 MG tablet Take 1 tablet by mouth every 4 (four) hours as needed (breakthrough pain). Patient not taking: Reported on 05/25/2017 07/24/16   [name], MD  mupirocin ointment (BACTROBAN) 2 % Place 1 application into the nose 2 (two) times daily. Patient not taking: Reported on 05/25/2017 12/02/16   Leandrew Koyanagi, MD  silver sulfADIAZINE (SILVADENE) 1 % cream Apply 1 application topically daily. Patient not taking: Reported on 05/25/2017 09/23/16   [name], MD    Family History Family History  Problem Relation Age of Onset  . Alzheimer's disease Sister   . AAA (abdominal aortic aneurysm) Sister   . Lung cancer Brother   . Memory loss Sister   . Memory loss Sister     Social History Social History  Substance Use Topics  . Smoking status: Former Smoker    Packs/day: 1.00    Years: 4.00    Quit date: 07/16/1968  . Smokeless tobacco: Never Used     Comment: quit 50 years ago  . Alcohol use No     Allergies   Tramadol   Review of Systems Review of Systems Level V caveat-  Physical Exam Updated Vital Signs BP 133/64   Pulse 65   Temp 98 F (36.7 C) (  Oral)   Resp 16   Ht 5\' 8"  (1.727 m)   Wt 93.9 kg (207 lb)   SpO2 96%   BMI 31.47 kg/m   Physical Exam  Constitutional: He appears well-developed and well-nourished. No distress.  HENT:  Head: Normocephalic and atraumatic.  Large scalp laceration, posterior 2 x 1.5 cm linear lacerations with bleeding controlled.  Eyes: Pupils are equal, round, and reactive to light.  Neck: Normal range of motion. Neck supple.  In c-collar  Cardiovascular: Normal rate and regular rhythm.   Pulmonary/Chest: Effort normal.  Abdominal: Soft.  Neurological: He is alert. No cranial nerve deficit.  Equal strengths to bilateral upper and lower extremities. Oriented to person, place and time. Reports feeling dizzy and having "brain fog", somewhat disoriented  Skin: Skin is warm and dry.  Nursing note and vitals reviewed.    ED Treatments / Results  Labs (all labs ordered are listed, but only abnormal results are displayed) Labs Reviewed  COMPREHENSIVE METABOLIC PANEL - Abnormal; Notable for the following:       Result Value   Glucose, Bld 105 (*)    Total Protein 6.3 (*)    All other components within normal limits  CBC - Abnormal; Notable for the following:    Hemoglobin 12.7 (*)    All other components within normal limits  CBG MONITORING, ED - Abnormal; Notable for the  following:    Glucose-Capillary 107 (*)    All other components within normal limits  URINALYSIS, ROUTINE W REFLEX MICROSCOPIC    EKG  EKG Interpretation None       Radiology Ct Head Wo Contrast  Result Date: 05/25/2017 CLINICAL DATA:  Fall, posterior head laceration, on anti coagulation EXAM: CT HEAD WITHOUT CONTRAST CT CERVICAL SPINE WITHOUT CONTRAST TECHNIQUE: Multidetector CT imaging of the head and cervical spine was performed following the standard protocol without intravenous contrast. Multiplanar CT image reconstructions of the cervical spine were also generated. COMPARISON:  None. FINDINGS: CT HEAD FINDINGS Brain: No evidence of acute infarction, hemorrhage, hydrocephalus, extra-axial collection or mass lesion/mass effect. Mild cortical atrophy. Subcortical white matter and periventricular small vessel ischemic changes. Vascular: Intracranial atherosclerosis. Skull: Normal. Negative for fracture or focal lesion. Sinuses/Orbits: The visualized paranasal sinuses are essentially clear. The mastoid air cells are unopacified. Other: Soft tissue swelling/hematoma overlying the right posterior vertex (series 4/ image 26). CT CERVICAL SPINE FINDINGS Alignment: Normal cervical lordosis. Skull base and vertebrae: No acute fracture. No primary bone lesion or focal pathologic process. Soft tissues and spinal canal: No prevertebral fluid or swelling. No visible canal hematoma. Disc levels: Mild degenerative changes of the mid/lower cervical spine. Spinal canal is patent. Upper chest: Visualized lung apices are clear. Other: Visualized thyroid is unremarkable. IMPRESSION: Soft tissue swelling/hematoma overlying the right posterior vertex. No evidence of calvarial fracture. No evidence of acute intracranial abnormality. Mild atrophy with small vessel ischemic changes. No evidence of traumatic injury to the cervical spine. Mild degenerative changes. Electronically Signed   By: Julian Hy M.D.   On:  05/25/2017 13:44   Ct Cervical Spine Wo Contrast  Result Date: 05/25/2017 CLINICAL DATA:  Fall, posterior head laceration, on anti coagulation EXAM: CT HEAD WITHOUT CONTRAST CT CERVICAL SPINE WITHOUT CONTRAST TECHNIQUE: Multidetector CT imaging of the head and cervical spine was performed following the standard protocol without intravenous contrast. Multiplanar CT image reconstructions of the cervical spine were also generated. COMPARISON:  None. FINDINGS: CT HEAD FINDINGS Brain: No evidence of acute infarction, hemorrhage, hydrocephalus, extra-axial collection  or mass lesion/mass effect. Mild cortical atrophy. Subcortical white matter and periventricular small vessel ischemic changes. Vascular: Intracranial atherosclerosis. Skull: Normal. Negative for fracture or focal lesion. Sinuses/Orbits: The visualized paranasal sinuses are essentially clear. The mastoid air cells are unopacified. Other: Soft tissue swelling/hematoma overlying the right posterior vertex (series 4/ image 26). CT CERVICAL SPINE FINDINGS Alignment: Normal cervical lordosis. Skull base and vertebrae: No acute fracture. No primary bone lesion or focal pathologic process. Soft tissues and spinal canal: No prevertebral fluid or swelling. No visible canal hematoma. Disc levels: Mild degenerative changes of the mid/lower cervical spine. Spinal canal is patent. Upper chest: Visualized lung apices are clear. Other: Visualized thyroid is unremarkable. IMPRESSION: Soft tissue swelling/hematoma overlying the right posterior vertex. No evidence of calvarial fracture. No evidence of acute intracranial abnormality. Mild atrophy with small vessel ischemic changes. No evidence of traumatic injury to the cervical spine. Mild degenerative changes. Electronically Signed   By: Julian Hy M.D.   On: 05/25/2017 13:44    Procedures Procedures (including critical care time)  Medications Ordered in ED Medications  sodium chloride 0.9 % bolus 500 mL  (500 mLs Intravenous New Bag/Given 05/25/17 1426)  meclizine (ANTIVERT) tablet 25 mg (not administered)  lidocaine-EPINEPHrine (XYLOCAINE W/EPI) 2 %-1:200000 (PF) injection 10 mL (10 mLs Other Given 05/25/17 1423)  ondansetron (ZOFRAN) injection 4 mg (4 mg Intravenous Given 05/25/17 1424)   LACERATION REPAIR Performed by: Linus Mako Authorized by: Linus Mako Consent: Verbal consent obtained. Risks and benefits: risks, benefits and alternatives were discussed Consent given by: patient Patient identity confirmed: provided demographic data Prepped and Draped in normal sterile fashion Wound explored  Laceration Location: posterior scalp  Laceration Length: 1.5 cm  No Foreign Bodies seen or palpated  Anesthesia: local infiltration  Local anesthetic: lidocaine 2% with  epinephrine  Anesthetic total: 5 ml  Irrigation method: syringe Amount of cleaning: standard  Skin closure: staples  Number of sutures: 5  Technique: staples  Patient tolerance: Patient tolerated the procedure well with no immediate complications.   Initial Impression / Assessment and Plan / ED Course  I have reviewed the triage vital signs and the nursing notes. Pertinent labs & imaging results that were available during my care of the patient were reviewed by me and considered in my medical decision making (see chart for details).  Clinical Course as of May 26 1503  Mon May 25, 2017  1349 CT head and cervical have come back not acute. C-collar will be removed. Will need to repair laceration.Updated pt and his wife.  [TG]  1422 Pt had another bought of vertigo in the CT scanner, had a few episodes of vomiting. Will given Zofran 4mg , 25 mg Antivert and small 562ml bolus of fluids. He wants to go home if the queasy feeling will go away. Wife says this is not an unusual situation for him and she will be fine with him at home  [TG]  1501 Patient signed out to [employer], PA-C.  Patient still needs to  be evaluated by Dr. Vanita Panda. He is getting his IV fluids, Antivert and Zofran. He has been dizzy and I do not think he is ready ambulate yet. Recommend observing for another hour then recommend ambulating him with his walker to make sure he is mobile and that the patient and his wife feel comfortable with home.  [TG]    Clinical Course User Index [TG] Delos Haring, PA-C    Final Clinical Impressions(s) / ED Diagnoses   Final diagnoses:  Laceration of scalp, initial encounter  Hematoma of scalp, initial encounter  Vertigo  Fall, initial encounter    New Prescriptions New Prescriptions   No medications on file     Delos Haring, PA-C 05/25/17 1503    Delos Haring, PA-C 05/25/17 1504    Carmin Muskrat, MD 05/25/17 [phone]

## 2017-05-25 NOTE — ED Notes (Signed)
Pt ambulating in hallway.

## 2017-05-25 NOTE — ED Notes (Signed)
Fluid challenged with medication admin. Pt tolerated well

## 2017-05-25 NOTE — ED Triage Notes (Signed)
According to Meds list pt is on Eliquis.  Pt now recalls this.

## 2017-05-25 NOTE — ED Notes (Signed)
Removed pt C-Collar per Tiffany(PA)

## 2017-05-25 NOTE — ED Notes (Signed)
ED Provider at bedside. 

## 2017-05-25 NOTE — ED Notes (Signed)
Pt verbalized understanding of discharge instructions and denies any further questions at this time.   

## 2017-05-25 NOTE — ED Notes (Signed)
Pt is unsure if he brought his glasses. States he is just not sure. I will call over to CT

## 2017-05-25 NOTE — ED Notes (Signed)
Assisted pt with walking, pt was able to walk to Pod B nurse station and back to his room with no complaints.

## 2017-05-25 NOTE — ED Provider Notes (Signed)
[name] is 83 year old male. Patient was received at sign out from [place] who planned to d/c the patient after dose of Antivert if the patient was successfully ambulated throughout the unit with his home walker.   Per PA Greene's HPI: "Patient with multiple medical problems that include hypertension, high cholesterol, hx of falls, sleep apnea, former smoke (1969), spinal stenosis, DVT of leg, OSA cpap, hx of memory loss.   Level V caveat- pt seems to have a hard time remembering. He said when he woke up his wife was still in bed but he went to run a few errands. He felt dizzy and had a fall. He reports pain to his head and dizziness, denies any other complaints.  12:56 pm  I spoke with his wife who says he often "almost falls" and complains about being dizzy a lot for several years. No memory problems or blood thinners. They live at [facility]. His PCP [name]. She said he was going to get his glasses adjusted.   1:27 pm I went to recheck patient and he now remembers what happened. He is no longer having "foggy brain". He went to go get his glasses's adjusted and when he went to step onto the curb he remembered that he forgot his walker in the car. So he turned around to go get it and felt dizzy and fell hitting his head."    Physical Exam  BP 135/66   Pulse 66   Temp 98 F (36.7 C) (Oral)   Resp 11   Ht 5\' 8"  (1.727 m)   Wt 93.9 kg (207 lb)   SpO2 100%   BMI 31.47 kg/m   Physical Exam  A&O x3. Ambulating without difficulty with a rolling walker.   ED Course  Procedures  MDM 83 year old male presenting with confusion, dizziness, and scalp laceration after a fall from standing earlier today. The laceration was repaired by PA Carlota Raspberry. Please see her note for procedure details.  C-spine and head CT negative. Labs unremarkable. Fluid challenged successfully with ginger ale. No further episodes of vomiting or nausea. The patient ambulated without  difficulty with a rolling walker throughout the unit without lightheaded or dizziness. Will discharge the patient with a short course of Antivert. Encouraged him to follow up with his PCP in 2-3 days for a wound recheck of his scalp laceration. Instructed the patient that his staples need to be removed in 10 days. Strict return precautions given. No acute distress. The patient is safe for discharge at this time.      Joanne Gavel, PA-C 05/26/17 0019    Tegeler, Gwenyth Allegra, MD 05/26/17 [phone]

## 2017-05-25 NOTE — Discharge Instructions (Signed)
Please follow-up with your primary care provider, Dr. [name], for a recheck of your scalp wound in 2-3 days.  The staples on the wound will need to be removed in 10 days. These can be removed by Dr. [name], at urgent care, or in the emergency department.  You may take one dose of Antivert every 8 hours, but only if you feel dizziness.  If you develop new or worsening symptoms, including weakness, confusion, worsening dizziness despite taking Antivert, please return to the emergency department for re-evaluation.

## 2018-02-08 ENCOUNTER — Ambulatory Visit: Payer: Medicare Other | Admitting: Neurology

## 2018-02-08 ENCOUNTER — Other Ambulatory Visit: Payer: Self-pay

## 2018-02-08 ENCOUNTER — Encounter: Payer: Self-pay | Admitting: Neurology

## 2018-02-08 VITALS — BP 115/60 | HR 68 | Ht 68.0 in | Wt 211.5 lb

## 2018-02-08 DIAGNOSIS — E538 Deficiency of other specified B group vitamins: Secondary | ICD-10-CM

## 2018-02-08 DIAGNOSIS — R269 Unspecified abnormalities of gait and mobility: Secondary | ICD-10-CM

## 2018-02-08 IMAGING — DX DG HIP (WITH OR WITHOUT PELVIS) 1V PORT*R*
2 series · 2 of 2 positions shown · non-contrast
Comparison: Intraoperative radiographic series of today's date

CLINICAL DATA: Post- operative right total hip arthroplasty.

EXAM:
DG HIP (WITH OR WITHOUT PELVIS) 1V PORT RIGHT

[pelvis ap]
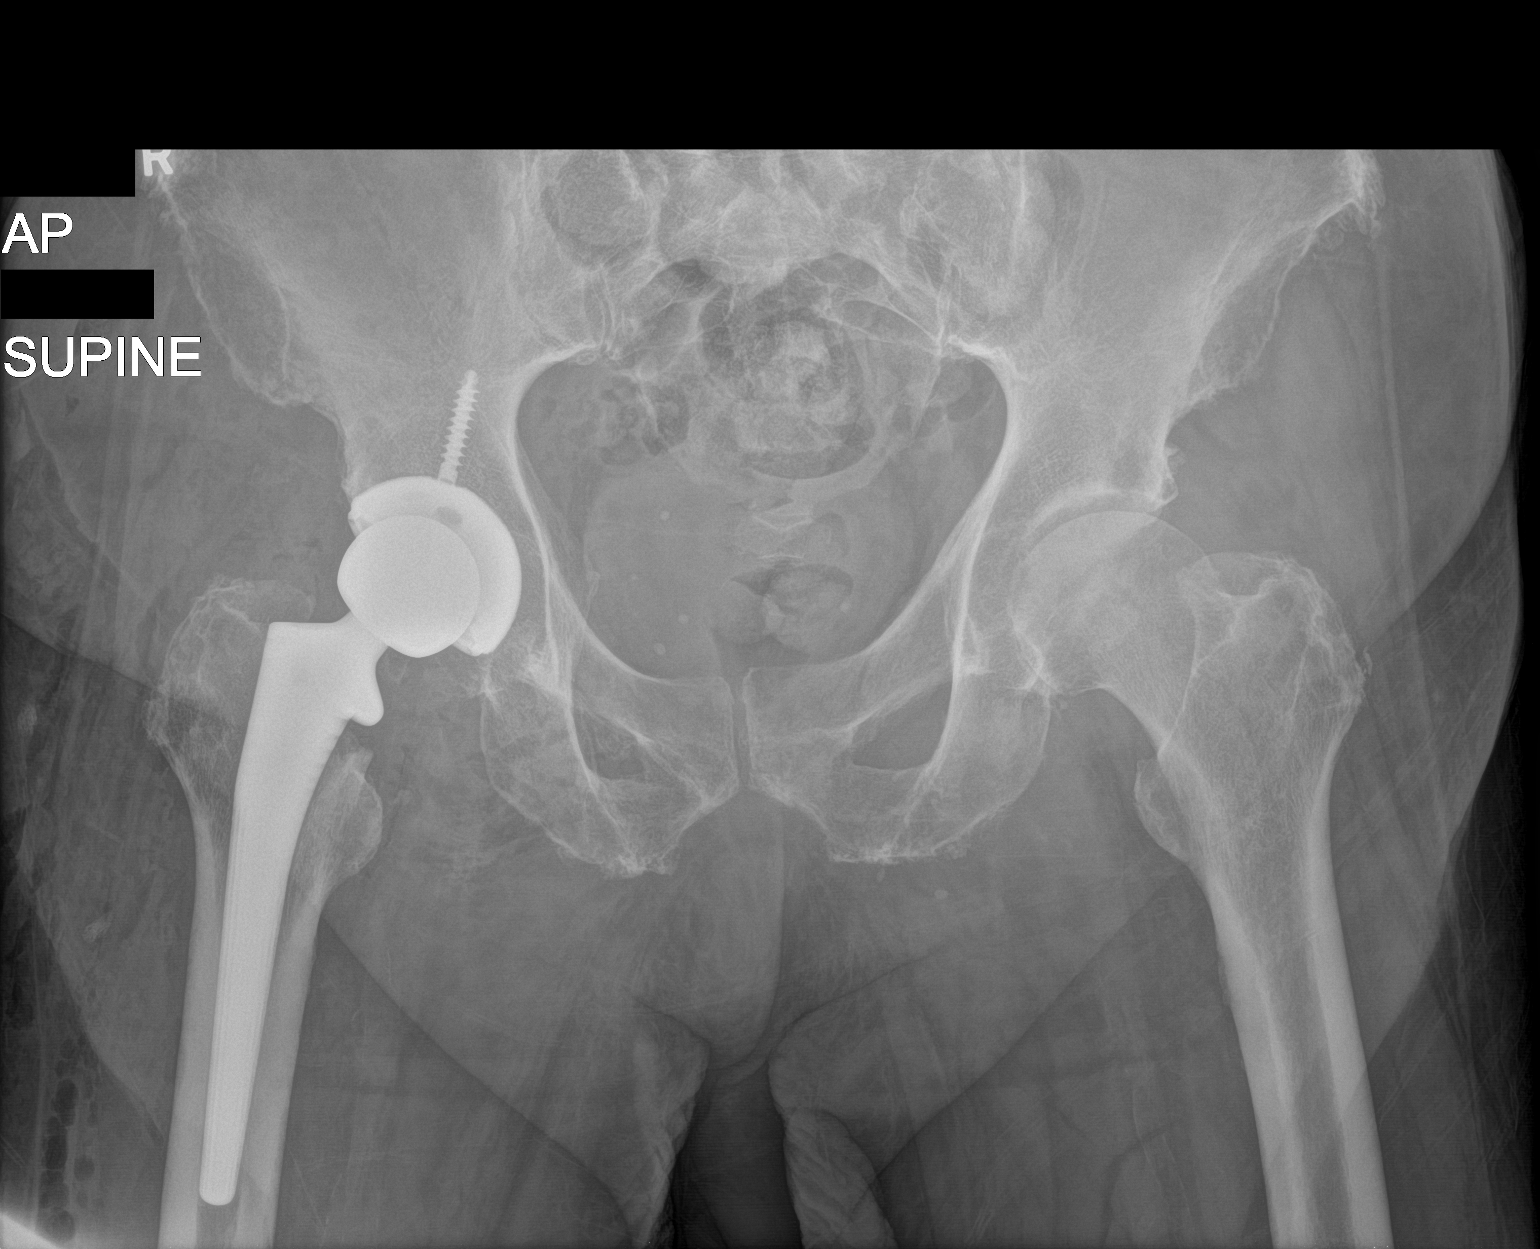

[x table lat]
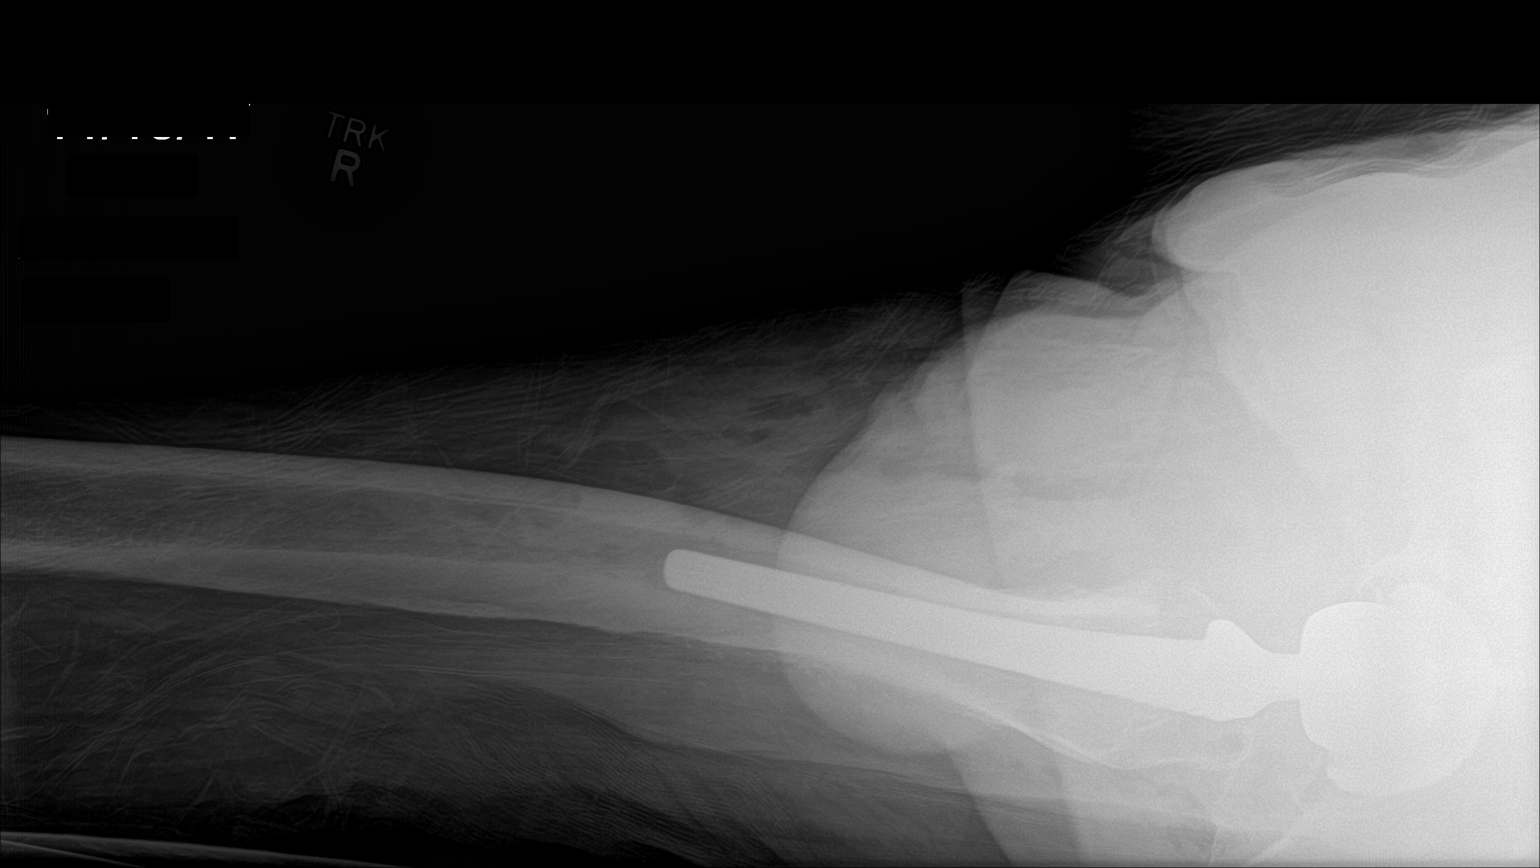

[2 of 2 positions shown; findings below may reference images not displayed]

FINDINGS: The prosthetic hip joint appears to be in reasonable position. The
interface with the native bone is normal. The native bone exhibits
no acute abnormality. The surrounding soft tissues are normal.
IMPRESSION: There is no postprocedure complication following right total hip
joint replacement.

## 2018-02-08 NOTE — Progress Notes (Signed)
Reason for visit: Gait disorder  Referring physician: Dr. Katheran James is a 84 y.o. male  History of present illness:  Mr. Edelson is an 84 year old right-handed white male with a history of a gradually progressive gait disorder.  The patient is a poor historian, but it appears that he has had some issues with his ability to ambulate since the early 1990s.  The patient indicates that in 2011 he began using a walker on a regular basis, he was using a cane prior to that.  The patient has noted that he has been stooping a bit more when walking, he has changed his walkers where he is now more upright and this is helped him.  The patient has had cervical spine surgery and lumbar surgery in the past.  He has undergone an MRI of the lumbosacral spine that did not show any severe nerve root impingement or spinal stenosis.  He does have multilevel spondylosis however.  The patient reports no true numbness and no true pain in the legs.  He does have a sensation in the lower extremities of heaviness.  The patient denies any difficulty with the upper extremities.  The patient has fallen on occasion, the last fall was in September 2018 where he received a laceration on the head.  The patient indicates that he has no difficulty controlling the bowels or the bladder.  He has undergone physical therapy in the past for his walking.  There is some concern that the legs have gotten weaker over time.  He is sent to this office for an evaluation.  The patient does give a history of vertigo episodes in the past.  He also has had a right total hip replacement previously.  Past Medical History:  Diagnosis Date  . Anemia   . Anticoagulated   . Anxiety   . Arthritis   . Complication of anesthesia    states "I was in [place] for several weeks after surgery"caused by tramadol  . Constipation   . Dysuria 07/28/2016  . Edema 07/24/2016  . Gait abnormality 07/25/2016  . Hard of hearing   . Hip  arthritis 07/21/2016  . History of fall 07/25/2016  . Hypercholesteremia   . Hypertension   . Lumbar spinal stenosis   . Memory loss 07/28/2016   mild  . Osteoarthritis of right hip 07/24/2016  . Sleep apnea    cpap  . Spinal stenosis of lumbar region 05/28/2016  . Urinary frequency   . Venous thrombosis of leg 07/24/2016   right gastrocnemius  . Vertigo   . Wears glasses   . Weight loss 04/08/2016    Past Surgical History:  Procedure Laterality Date  . BACK SURGERY  10/17, 80's  . COLONOSCOPY    . ESOPHAGOGASTRODUODENOSCOPY    . LUMBAR LAMINECTOMY/DECOMPRESSION MICRODISCECTOMY Right 05/28/2016   Procedure: Right Lumbar Three-Four Microdiscectomy;  Surgeon: [name], MD;  Location: [facility];  Service: Neurosurgery;  Laterality: Right;  . TOTAL HIP ARTHROPLASTY Right 07/21/2016   Procedure: TOTAL HIP ARTHROPLASTY ANTERIOR APPROACH;  Surgeon: [name], MD;  Location: [facility];  Service: Orthopedics;  Laterality: Right;    Family History  Problem Relation Age of Onset  . Alzheimer's disease Sister   . AAA (abdominal aortic aneurysm) Sister   . Lung cancer Brother   . Memory loss Sister   . Memory loss Sister     Social history:  reports that he quit smoking about 49 years ago. He  has a 4.00 pack-year smoking history. He has never used smokeless tobacco. He reports that he does not drink alcohol or use drugs.  Medications:  Prior to Admission medications   Medication Sig Start Date End Date Taking? Authorizing Provider  atorvastatin (LIPITOR) 40 MG tablet Take 40 mg by mouth daily.    Yes [provider]  cetirizine (ZYRTEC) 10 MG tablet Take 10 mg by mouth daily as needed for allergies.   Yes [provider]  Coenzyme Q10 (EQL COQ10) 300 MG CAPS Take 300 mg by mouth daily.   Yes [provider]  fluticasone (FLONASE) 50 MCG/ACT nasal spray Place 2 sprays into both nostrils as needed.    Yes [provider]    Gluc-Chonn-MSM-Boswellia-Vit D (GLUCOSAMINE CHOND TRIPLE/VIT D PO) Take 2 tablets by mouth daily.   Yes [provider]  lisinopril (PRINIVIL,ZESTRIL) 10 MG tablet Take 10 mg by mouth daily.   Yes [provider]  sodium chloride (OCEAN) 0.65 % SOLN nasal spray Place 2 sprays into both nostrils as needed for congestion.   Yes [provider]  tamsulosin (FLOMAX) 0.4 MG CAPS capsule Take 0.4 mg by mouth daily.    Yes [provider]  meclizine (ANTIVERT) 12.5 MG tablet Take 1 tablet (12.5 mg total) by mouth 3 (three) times daily as needed for dizziness. Patient not taking: Reported on 02/08/2018 05/25/17   McDonald, Mia A, PA-C      Allergies  Allergen Reactions  . Tramadol Other (See Comments)    "does not eat" LOSS OF APPETITE    ROS:  Out of a complete 14 system review of symptoms, the patient complains only of the following symptoms, and all other reviewed systems are negative.  Swelling in the legs Ringing in the ears Snoring, CPAP Joint pain, aching muscles Incontinence of the bladder, impotence Memory loss, dizziness Decreased energy, change in appetite  Blood pressure 115/60, pulse 68, height 5\' 8"  (1.727 m), weight 211 lb 8 oz (95.9 kg), SpO2 98 %.  Physical Exam  General: The patient is alert and cooperative at the time of the examination.  The patient is moderately obese.  Eyes: Pupils are equal, round, and reactive to light. Discs are flat bilaterally.  Neck: The neck is supple, no carotid bruits are noted.  Respiratory: The respiratory examination is clear.  Cardiovascular: The cardiovascular examination reveals a regular rate and rhythm, no obvious murmurs or rubs are noted.  Skin: Extremities are with 2+ edema below the knees bilaterally.  Neurologic Exam  Mental status: The patient is alert and oriented x 3 at the time of the examination. The patient has apparent normal recent and remote memory, with an apparently normal  attention span and concentration ability.  Cranial nerves: Facial symmetry is present. There is good sensation of the face to pinprick and soft touch bilaterally. The strength of the facial muscles and the muscles to head turning and shoulder shrug are normal bilaterally. Speech is well enunciated, no aphasia or dysarthria is noted. Extraocular movements are full. Visual fields are full. The tongue is midline, and the patient has symmetric elevation of the soft palate. No obvious hearing deficits are noted.  Motor: The motor testing reveals 5 over 5 strength of all 4 extremities. Good symmetric motor tone is noted throughout.  Sensory: Sensory testing is intact to pinprick, soft touch, vibration sensation, and position sense on the upper extremities.  With the lower extremities there is a stocking pattern pinprick sensory deficit across the  ankles with mild to moderate impairment of position sense in both feet, decreased vibration sensation on the left foot as compared to the right.  No evidence of extinction is noted.  Coordination: Cerebellar testing reveals good finger-nose-finger and heel-to-shin bilaterally.  Gait and station: Gait is wide-based, unsteady.  The patient uses a walker for ambulation.  The patient has good stride and good stability with a walker.  Tandem gait was not attempted.  Romberg is positive, the patient tends to fall backwards.  Reflexes: Deep tendon reflexes are symmetric, but are depressed bilaterally. Toes are downgoing bilaterally.   CT head and cervical 05/25/17:  IMPRESSION: Soft tissue swelling/hematoma overlying the right posterior vertex. No evidence of calvarial fracture.  No evidence of acute intracranial abnormality. Mild atrophy with small vessel ischemic changes.  No evidence of traumatic injury to the cervical spine. Mild degenerative changes.  * CT scan images were reviewed online. I agree with the written report.    Assessment/Plan:  1.   Chronic gait disorder  The patient does have some mild sensory impairment in both legs, he will be sent for blood work today.  He will have nerve conduction studies on both legs and EMG on the right leg.  He will follow-up for the above study.  The patient does not appear to have true weakness in the lower extremities, he does have gait instability.  He is stooped with walking, he claims that this is improved since he has gotten a different type of walker.  Jill Alexanders MD 02/08/2018 2:29 PM  [facility] [address] [place], [place] [address]  Phone [phone] Fax [phone]

## 2018-02-09 ENCOUNTER — Telehealth: Payer: Self-pay | Admitting: *Deleted

## 2018-02-09 LAB — CK: Total CK: 139 U/L (ref 24–204)

## 2018-02-09 LAB — RPR: RPR: NONREACTIVE

## 2018-02-09 LAB — VITAMIN B12: VITAMIN B 12: 232 pg/mL (ref 232–1245)

## 2018-02-09 LAB — SEDIMENTATION RATE: Sed Rate: 8 mm/hr (ref 0–30)

## 2018-02-09 NOTE — Telephone Encounter (Signed)
-----   Message from Kathrynn Ducking, MD sent at 02/09/2018  7:15 AM EDT ----- Blood work is unremarkable but vitamin B12 level is low normal, recommend going on 1000 mcg B12 tablets daily.  Please call the patient. ----- Message ----- From: Lavone Neri Lab Results In Sent: 02/09/2018   5:39 AM To: Kathrynn Ducking, MD

## 2018-02-09 NOTE — Telephone Encounter (Signed)
I tried calling pt on his number. Went to VM and VM full.  Called and spoke with wife, [name] (on [place]) about unremarkable labs. B12 on low normal level. Dr. Jannifer Franklin recommended 1013mcg daily of Vit B12. She read back recommendation correctly. I went over examples of foods higher in b12 as requested per wife.

## 2018-03-10 ENCOUNTER — Encounter: Payer: Self-pay | Admitting: Neurology

## 2018-03-10 ENCOUNTER — Ambulatory Visit: Payer: Medicare Other | Admitting: Neurology

## 2018-03-10 ENCOUNTER — Ambulatory Visit (INDEPENDENT_AMBULATORY_CARE_PROVIDER_SITE_OTHER): Payer: Medicare Other | Admitting: Neurology

## 2018-03-10 DIAGNOSIS — G629 Polyneuropathy, unspecified: Secondary | ICD-10-CM

## 2018-03-10 DIAGNOSIS — G603 Idiopathic progressive neuropathy: Secondary | ICD-10-CM | POA: Diagnosis not present

## 2018-03-10 DIAGNOSIS — G609 Hereditary and idiopathic neuropathy, unspecified: Secondary | ICD-10-CM

## 2018-03-10 DIAGNOSIS — R269 Unspecified abnormalities of gait and mobility: Secondary | ICD-10-CM

## 2018-03-10 DIAGNOSIS — G6289 Other specified polyneuropathies: Secondary | ICD-10-CM | POA: Insufficient documentation

## 2018-03-10 HISTORY — DX: Polyneuropathy, unspecified: G62.9

## 2018-03-10 NOTE — Procedures (Signed)
     HISTORY:  [name] is an 84 year old gentleman with a history of a progressive gait disorder.  He does note some mild reduction in sensation in both lower extremities. He is being evaluated for a neuropathy or a lumbosacral radiculopathy.  NERVE CONDUCTION STUDIES:  Nerve conduction studies were performed on both lower extremities.  The distal motor latencies for the peroneal nerves were normal bilaterally with low motor amplitude seen for these nerves bilaterally.  The distal motor latency of the right posterior tibial nerve was normal with a low motor amplitude.  No response was seen to the left posterior tibial nerve.  Slowing was seen for the peroneal and posterior tibial nerves bilaterally.  The sensory latencies for the sural and peroneal nerves were unobtainable bilaterally.  EMG STUDIES:  EMG study was performed on the right lower extremity:  The tibialis anterior muscle reveals 2 to 5K motor units with decreased recruitment. No fibrillations or positive waves were seen. The peroneus tertius muscle reveals 2 to 5K motor units with decreased recruitment. No fibrillations or positive waves were seen. The medial gastrocnemius muscle reveals 1 to 4K motor units with decreased recruitment. No fibrillations or positive waves were seen. The vastus lateralis muscle reveals 2 to 4K motor units with slightly reduced recruitment. No fibrillations or positive waves were seen. The iliopsoas muscle reveals 2 to 4K motor units with full recruitment. No fibrillations or positive waves were seen. The biceps femoris muscle (long head) reveals 2 to 5K motor units with slightly reduced recruitment. No fibrillations or positive waves were seen. The lumbosacral paraspinal muscles were tested at 3 levels, and revealed no abnormalities of insertional activity at all 3 levels tested. There was good relaxation.   IMPRESSION:  Nerve conduction studies done on both lower extremities shows evidence  of a relatively severe primarily axonal peripheral neuropathy.  EMG evaluation of the right lower extremity shows chronic stable distal signs of denervation consistent with the diagnosis of peripheral neuropathy.  There is no evidence of an overlying lumbosacral radiculopathy.  Jill Alexanders MD 03/10/2018 2:47 PM  [facility] [address] [place], [place] [address]  Phone [phone] Fax [phone]

## 2018-03-10 NOTE — Progress Notes (Signed)
Please refer to EMG and nerve conduction study procedure note. 

## 2018-03-10 NOTE — Progress Notes (Signed)
[facility]    Nerve / Sites Muscle Latency Ref. Amplitude Ref. Rel Amp Segments Distance Velocity Ref. Area    ms ms mV mV %  cm m/s m/s mVms  R Peroneal - EDB     Ankle EDB 5.1 ?6.5 0.7 ?2.0 100 Ankle - EDB 9   1.7     Fib head EDB 12.2  0.7  96.2 Fib head - Ankle 30 42 ?44 1.5     Pop fossa EDB 14.6  0.6  85 Pop fossa - Fib head 10 42 ?44 1.3         Pop fossa - Ankle      L Peroneal - EDB     Ankle EDB 6.1 ?6.5 0.3 ?2.0 100 Ankle - EDB 9   0.5     Fib head EDB 14.7  0.3  89.2 Fib head - Ankle 30 35 ?44 0.4     Pop fossa EDB 17.6  0.3  109 Pop fossa - Fib head 10 36 ?44 0.5         Pop fossa - Ankle      R Tibial - AH     Ankle AH 4.8 ?5.8 1.0 ?4.0 100 Ankle - AH 9   3.0     Pop fossa AH 15.2  0.6  66 Pop fossa - Ankle 38 37 ?41 1.5  L Tibial - AH     Ankle AH NR ?5.8 NR ?4.0 NR Ankle - AH 9   NR     Pop fossa AH NR  NR  NR Pop fossa - Ankle 38 NR ?41 NR             SNC    Nerve / Sites Rec. Site Peak Lat Ref.  Amp Ref. Segments Distance    ms ms V V  cm  R Sural - Ankle (Calf)     Calf Ankle NR ?4.4 NR ?6 Calf - Ankle 14  L Sural - Ankle (Calf)     Calf Ankle NR ?4.4 NR ?6 Calf - Ankle 14  R Superficial peroneal - Ankle     Lat leg Ankle NR ?4.4 NR ?6 Lat leg - Ankle 14  L Superficial peroneal - Ankle     Lat leg Ankle NR ?4.4 NR ?6 Lat leg - Ankle 14              F  Wave    Nerve F Lat Ref.   ms ms  R Tibial - AH 62.4 ?56.0       EMG full

## 2018-03-13 LAB — MULTIPLE MYELOMA PANEL, SERUM
ALBUMIN SERPL ELPH-MCNC: 4 g/dL (ref 2.9–4.4)
ALPHA2 GLOB SERPL ELPH-MCNC: 0.8 g/dL (ref 0.4–1.0)
Albumin/Glob SerPl: 1.6 (ref 0.7–1.7)
Alpha 1: 0.2 g/dL (ref 0.0–0.4)
B-Globulin SerPl Elph-Mcnc: 0.8 g/dL (ref 0.7–1.3)
GLOBULIN, TOTAL: 2.6 g/dL (ref 2.2–3.9)
Gamma Glob SerPl Elph-Mcnc: 0.9 g/dL (ref 0.4–1.8)
IGM (IMMUNOGLOBULIN M), SRM: 124 mg/dL (ref 15–143)
IgA/Immunoglobulin A, Serum: 146 mg/dL (ref 61–437)
IgG (Immunoglobin G), Serum: 967 mg/dL (ref 700–1600)
TOTAL PROTEIN: 6.6 g/dL (ref 6.0–8.5)

## 2018-03-13 LAB — B. BURGDORFI ANTIBODIES

## 2018-03-13 LAB — ANGIOTENSIN CONVERTING ENZYME

## 2018-03-13 LAB — ANA W/REFLEX: ANA: NEGATIVE

## 2018-03-15 ENCOUNTER — Telehealth: Payer: Self-pay | Admitting: *Deleted

## 2018-03-15 NOTE — Telephone Encounter (Signed)
Called, LVM for pt wife about unremarkable labs per CW,MD note. Advised I tried patient number but VM full. Gave [facility] phone number if any further questions.

## 2018-03-15 NOTE — Telephone Encounter (Signed)
-----   Message from Kathrynn Ducking, MD sent at 03/14/2018  4:09 PM EDT -----  The blood work results are unremarkable. Please call the patient.  ----- Message ----- From: Lavone Neri Lab Results In Sent: 03/11/2018   7:38 AM To: Kathrynn Ducking, MD

## 2018-06-01 ENCOUNTER — Telehealth: Payer: Self-pay | Admitting: Neurology

## 2018-06-01 ENCOUNTER — Encounter: Payer: Self-pay | Admitting: Neurology

## 2018-06-01 NOTE — Telephone Encounter (Signed)
Placed letter in mail for pt. 

## 2018-06-01 NOTE — Telephone Encounter (Signed)
I called the patient.  The patient has left shoulder issues, he is not able to elevate the arm well, his current mailbox is about 5 feet up.  The patient is not able to access the mailbox well, he would prefer to have a mailbox lower I will write a letter.

## 2018-06-01 NOTE — Telephone Encounter (Addendum)
Pt called stating he is needing a letter stating that due to medical reasons he is unable to reach above his head, and would like his mailbox in the house development he is in moved from the top row. Requesting a call to discuss please advise

## 2018-10-14 ENCOUNTER — Telehealth: Payer: Self-pay | Admitting: Neurology

## 2018-10-14 NOTE — Telephone Encounter (Signed)
I tried to call the patient, unable to leave a message.  The patient was seen initially for a severe peripheral neuropathy that likely is associated with a gait disorder.  I do not have any record of giving order recently for physical therapy, may have been done through another physician.

## 2018-10-14 NOTE — Telephone Encounter (Signed)
Pt called said [name]/ PT at [facility] said Dr Viona Gilmore had ok'd him to have PT. Pt said he is having issues with the knee and is not able to do any PT at this. I advised him I could not find any orders for PT but would send a message.

## 2018-12-13 IMAGING — CT CT HEAD W/O CM
5 of 7 series · 17 of 47 positions shown, 18 images · non-contrast
Comparison: None.

CLINICAL DATA: Fall, posterior head laceration, on anti coagulation

EXAM:
CT HEAD WITHOUT CONTRAST
CT CERVICAL SPINE WITHOUT CONTRAST
TECHNIQUE: Multidetector CT imaging of the head and cervical spine was
performed following the standard protocol without intravenous
contrast. Multiplanar CT image reconstructions of the cervical spine
were also generated.

[Series 4: head wo · axial · 0.42mm/px · z∈[-134,-79]mm · 2 of 34 slices shown, 3 images]
[im 12/34  brain]
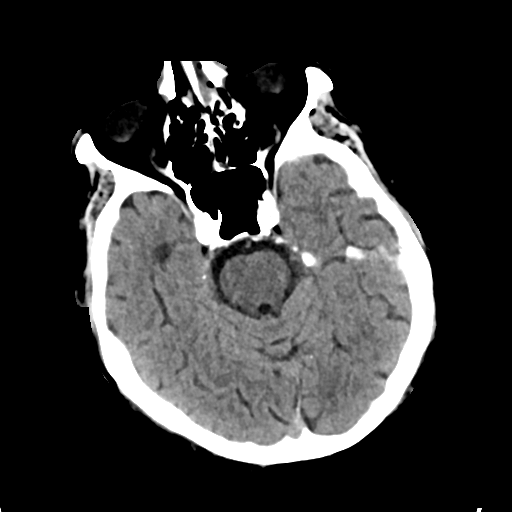
[im 12/34  bone]
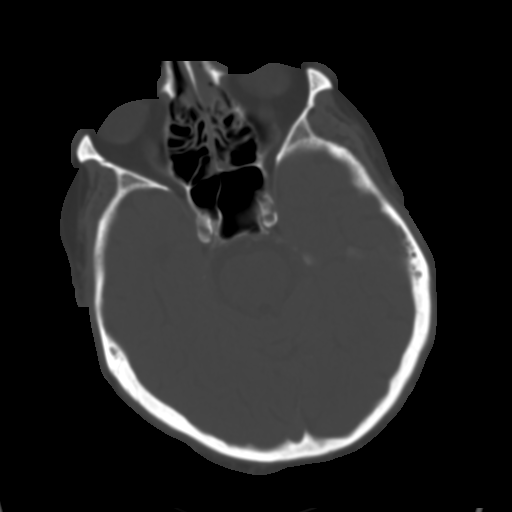
[im 23/34  brain]
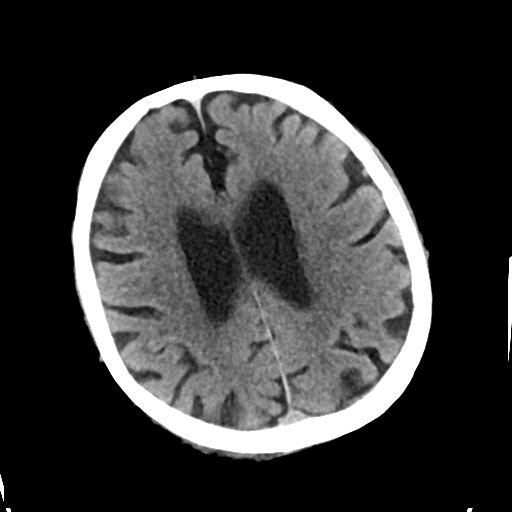

[Series 5: head bone · axial · 0.42mm/px · z∈[-175,-39]mm · 8 of 84 slices shown]
[im 8/84  bone]
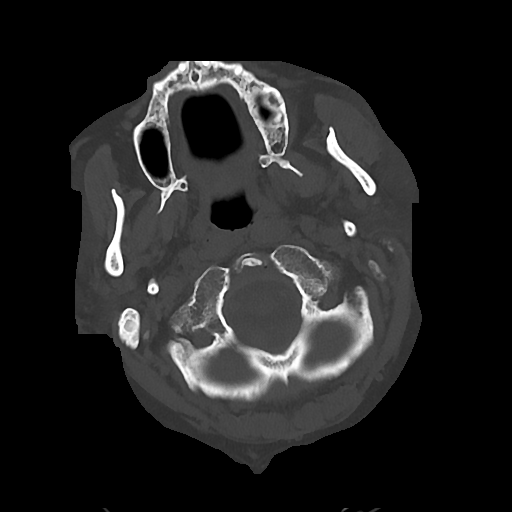
[im 16/84  bone]
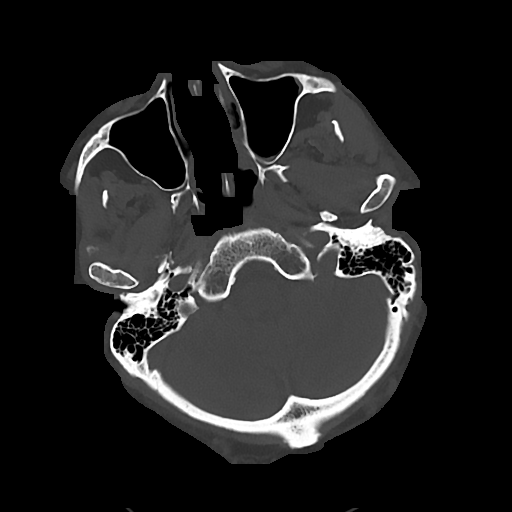
[im 31/84  bone]
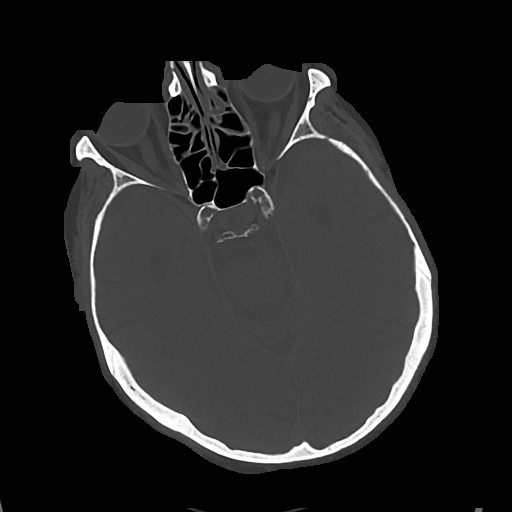
[im 38/84  bone]
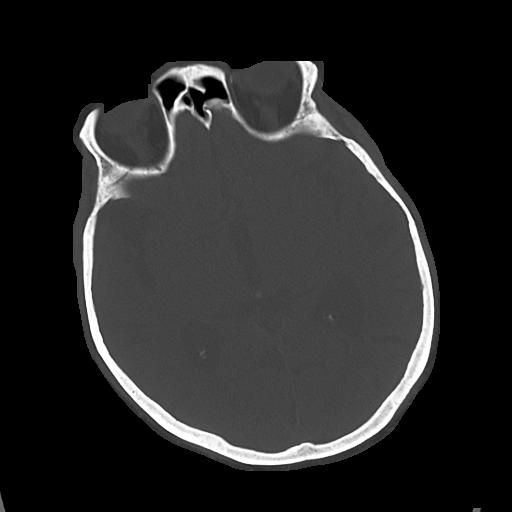
[im 46/84  bone]
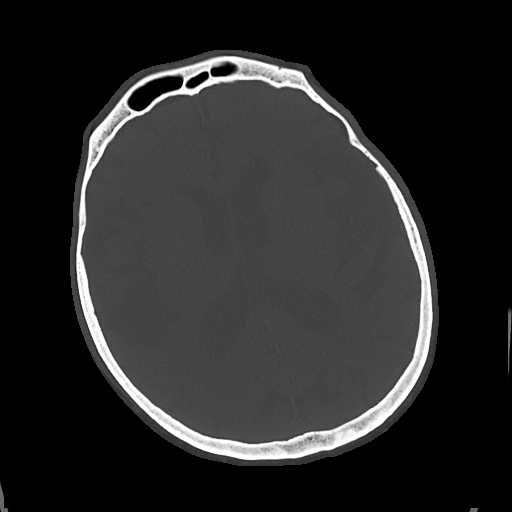
[im 53/84  bone]
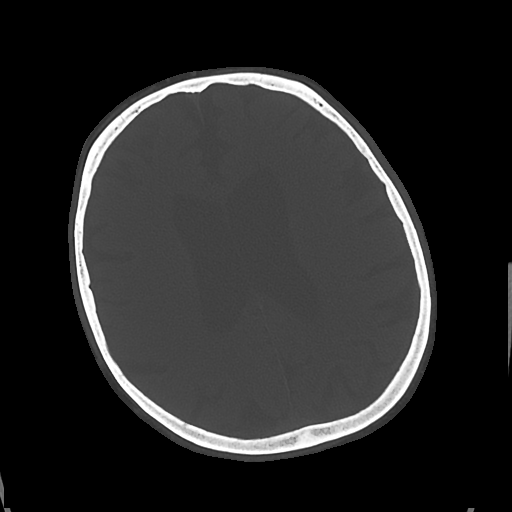
[im 68/84  bone]
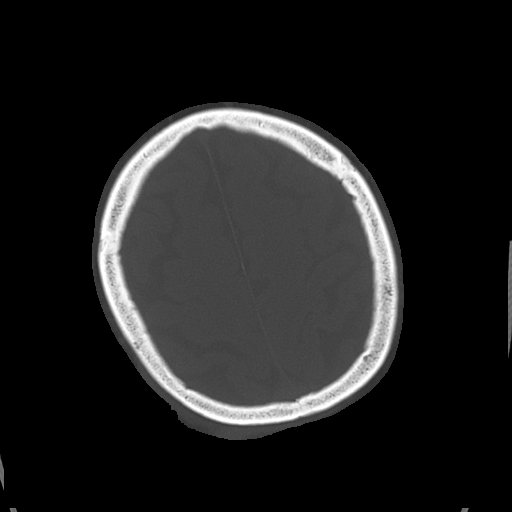
[im 76/84  bone]
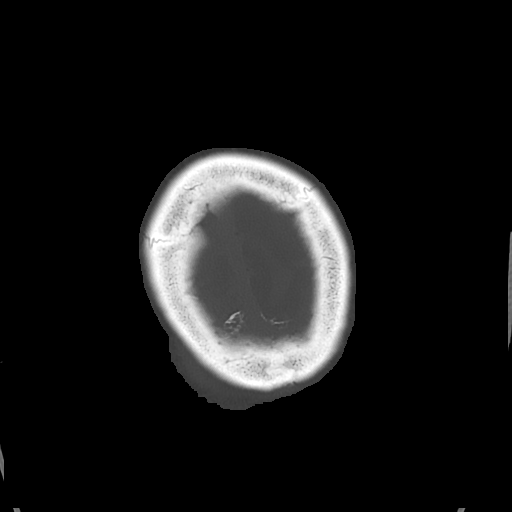

[Series 6: cor soft · coronal · 0.36mm/px · 3 of 63 slices shown]
[im 21/63  brain]
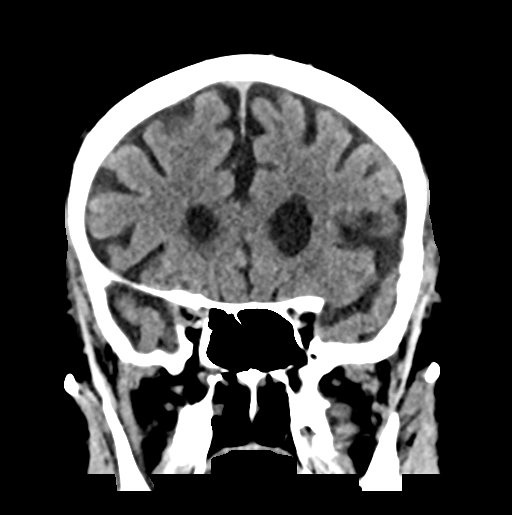
[im 32/63  brain]
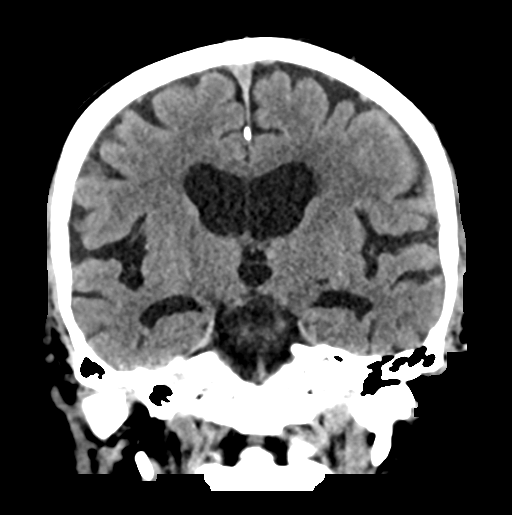
[im 42/63  brain]
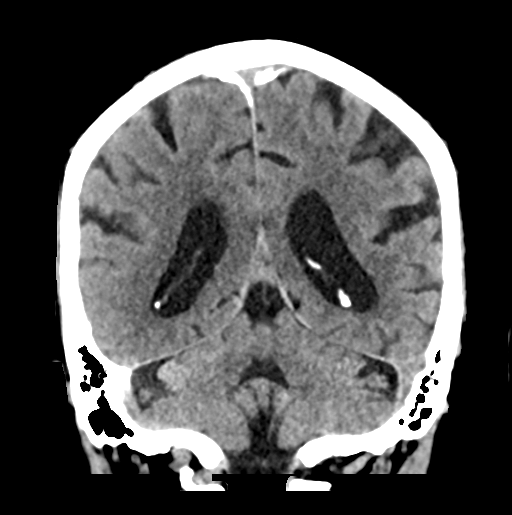

[Series 7: sag soft · sagittal · 0.36mm/px · 1 of 56 slices shown]
[im 28/56  brain]
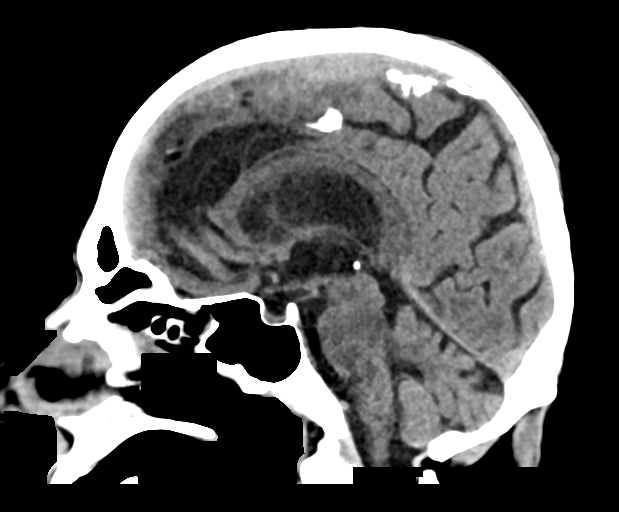

[Series 9: c spine soft · axial · 0.33mm/px · z∈[-314,-270]mm · 3 of 97 slices shown]
[im 8/97  brain]
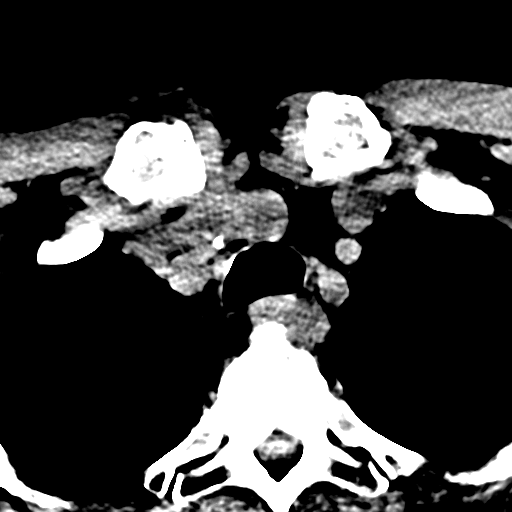
[im 23/97  brain]
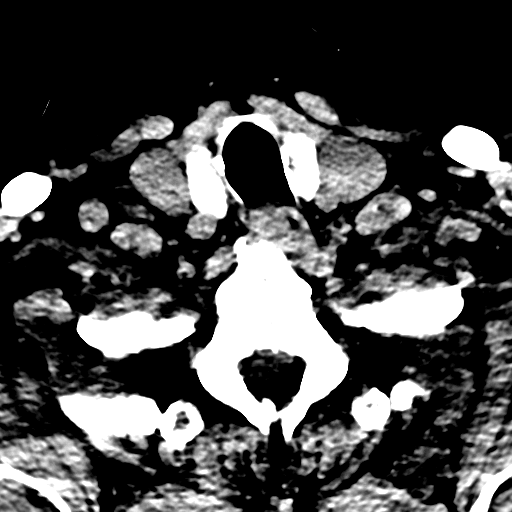
[im 30/97  brain]
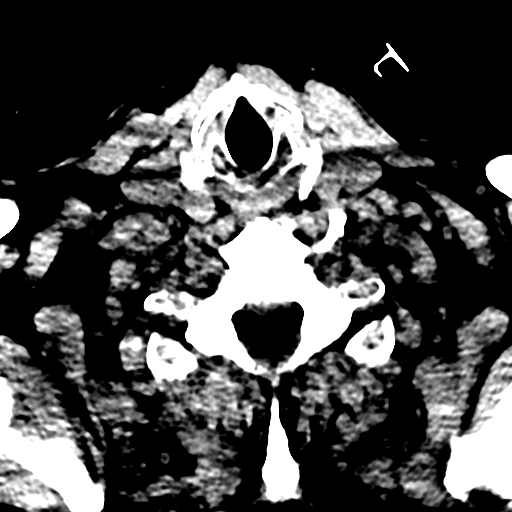

[17 of 47 positions shown; findings below may reference images not displayed]

FINDINGS: CT HEAD FINDINGS

Brain: No evidence of acute infarction, hemorrhage, hydrocephalus,
extra-axial collection or mass lesion/mass effect.

Mild cortical atrophy.

Subcortical white matter and periventricular small vessel ischemic
changes.

Vascular: Intracranial atherosclerosis.

Skull: Normal. Negative for fracture or focal lesion.

Sinuses/Orbits: The visualized paranasal sinuses are essentially
clear. The mastoid air cells are unopacified.

Other: Soft tissue swelling/hematoma overlying the right posterior
vertex (series 4/ image 26).

CT CERVICAL SPINE FINDINGS

Alignment: Normal cervical lordosis.

Skull base and vertebrae: No acute fracture. No primary bone lesion
or focal pathologic process.

Soft tissues and spinal canal: No prevertebral fluid or swelling. No
visible canal hematoma.

Disc levels: Mild degenerative changes of the mid/lower cervical
spine.

Spinal canal is patent.

Upper chest: Visualized lung apices are clear.

Other: Visualized thyroid is unremarkable.
IMPRESSION: Soft tissue swelling/hematoma overlying the right posterior vertex.
No evidence of calvarial fracture.

No evidence of acute intracranial abnormality. Mild atrophy with
small vessel ischemic changes.

No evidence of traumatic injury to the cervical spine. Mild
degenerative changes.

## 2019-03-21 ENCOUNTER — Encounter: Payer: Self-pay | Admitting: Internal Medicine

## 2019-03-24 ENCOUNTER — Non-Acute Institutional Stay: Payer: Medicare Other | Admitting: Nurse Practitioner

## 2019-03-24 ENCOUNTER — Encounter: Payer: Self-pay | Admitting: Nurse Practitioner

## 2019-03-24 ENCOUNTER — Other Ambulatory Visit: Payer: Self-pay

## 2019-03-24 DIAGNOSIS — R413 Other amnesia: Secondary | ICD-10-CM

## 2019-03-24 DIAGNOSIS — D500 Iron deficiency anemia secondary to blood loss (chronic): Secondary | ICD-10-CM | POA: Diagnosis not present

## 2019-03-24 DIAGNOSIS — G609 Hereditary and idiopathic neuropathy, unspecified: Secondary | ICD-10-CM | POA: Diagnosis not present

## 2019-03-24 DIAGNOSIS — R634 Abnormal weight loss: Secondary | ICD-10-CM

## 2019-03-24 DIAGNOSIS — M1711 Unilateral primary osteoarthritis, right knee: Secondary | ICD-10-CM

## 2019-03-24 DIAGNOSIS — G4730 Sleep apnea, unspecified: Secondary | ICD-10-CM

## 2019-03-24 DIAGNOSIS — R350 Frequency of micturition: Secondary | ICD-10-CM

## 2019-03-24 NOTE — Assessment & Plan Note (Signed)
Continue prn Tylenol 1000mg  q8h prn, Meloxicm 15mg  qd prn.

## 2019-03-24 NOTE — Assessment & Plan Note (Signed)
Continue Gabapentin 100mg  qd.

## 2019-03-24 NOTE — Progress Notes (Signed)
Provider:  [name] NP Location:   [facility]   Place of Service:  Clinic (12)  PCP: Syd Newsome X, NP Patient Care Team: Sieanna Vanstone X, NP as PCP - General (Internal Medicine) [name], [name], MD as Consulting Physician (Orthopedic Surgery) [name], MD as Consulting Physician (Gastroenterology) System, Provider Not In [name], MD as Consulting Physician (Neurosurgery) [name], MD as Referring Physician (Ophthalmology) Baldwin Racicot X, NP as Nurse Practitioner (Internal Medicine) Almedia Balls, MD as Referring Physician (Orthopedic Surgery) [name], MD as Consulting Physician (Neurosurgery) Elsie Saas, MD as Consulting Physician (Orthopedic Surgery) Allyn Kenner, MD (Dermatology) Kathrynn Ducking, MD as Consulting Physician (Neurology) Franchot Gallo, MD as Consulting Physician (Urology)  Extended Emergency Contact Information Primary Emergency Contact: [name] Address: [address]          [place]          [place], [place] [address] [place] Phone: [phone] Relation: Spouse  Code Status: DNR Goals of Care: Advanced Directive information Advanced Directives 03/24/2019  Does Patient Have a Medical Advance Directive? Yes  Type of Paramedic of [name];Living will;Out of facility DNR (pink MOST or yellow form)  Does patient want to make changes to medical advance directive? No - Patient declined  Copy of [place] in Chart? Yes - validated most recent copy scanned in chart (See row information)  Pre-existing out of facility DNR order (yellow form or pink MOST form) Yellow form placed in chart (order not valid for inpatient use)      Chief Complaint  Patient presents with   New Patient (Initial Visit)    New patient    HPI: Patient is a 85 y.o. male seen today for admission to service of [place]   The patient has history of BHP, on Tamsulosin 0.51m qd. OA,  knee pain, ambulates with walker, s/p Ortho, on Meloxicam 170mprn, Gabapentin 10058mhs, Tylenol 1000m58mh prn. He takes Vit B12 1000mc88m.   Past Medical History:  Diagnosis Date   [place], knee    [facility]    Anemia    Anticoagulated    Anxiety    Arthritis    Cervical disc disorder    [facility]    Complication of anesthesia    states "[place] after surgery"caused by tramadol   Constipation    Dysuria 07/28/2016   Edema 07/24/2016   Gait abnormality 07/25/2016   Hard of hearing    Head injury    As a result of a fall, [facility]    Hip arthritis 07/21/2016   History of fall 07/25/2016   Hypercholesteremia    Hypertension    Lumbar spinal stenosis    Memory loss 07/28/2016   mild   Obesity    [facility]    Osteoarthritis of right hip 07/24/2016   Peripheral neuropathy 03/10/2018   Sleep apnea    cpap   Spinal stenosis of lumbar region 05/28/2016   Urinary frequency    Venous thrombosis of leg 07/24/2016   right gastrocnemius   Vertigo    Wears glasses    Weight loss 04/08/2016   Past Surgical History:  Procedure Laterality Date   BACK SURGERY  10/17, 80's   COLONOSCOPY     COLONOSCOPY     [facility] Dr.Schooler  ESOPHAGOGASTRODUODENOSCOPY     LUMBAR LAMINECTOMY/DECOMPRESSION MICRODISCECTOMY Right 05/28/2016   Procedure: Right Lumbar Three-Four Microdiscectomy;  Surgeon: [name], MD;  Location: [facility];  Service: Neurosurgery;  Laterality: Right;   TOTAL HIP ARTHROPLASTY Right 07/21/2016   Procedure: TOTAL HIP ARTHROPLASTY ANTERIOR APPROACH;  Surgeon: [name], MD;  Location: [place];  Service: Orthopedics;  Laterality: Right;    reports that he quit smoking about 50 years ago. He has a 4.00 pack-year smoking history. He has never used smokeless tobacco. He reports that he does not drink alcohol  or use drugs. Social History   Socioeconomic History   Marital status: Married    Spouse name: Not on file   Number of children: 2   Years of education: MS   Highest education level: Not on file  Occupational History   Not on file  Social Needs   Financial resource strain: Not on file   Food insecurity    Worry: Not on file    Inability: Not on file   Transportation needs    Medical: Not on file    Non-medical: Not on file  Tobacco Use   Smoking status: Former Smoker    Packs/day: 1.00    Years: 4.00    Pack years: 4.00    Quit date: 07/16/1968    Years since quitting: 50.7   Smokeless tobacco: Never Used   Tobacco comment: quit 50 years ago  Substance and Sexual Activity   Alcohol use: No   Drug use: No   Sexual activity: Not Currently    Partners: Female  Lifestyle   Physical activity    Days per week: Not on file    Minutes per session: Not on file   Stress: Not on file  Relationships   Social connections    Talks on phone: Not on file    Gets together: Not on file    Attends religious service: Not on file    Active member of club or organization: Not on file    Attends meetings of clubs or organizations: Not on file    Relationship status: Not on file   Intimate partner violence    Fear of current or ex partner: Not on file    Emotionally abused: Not on file    Physically abused: Not on file    Forced sexual activity: Not on file  Other Topics Concern   Not on file  Social History Narrative   Lives w/ wife   Caffeine use: Coffee daily   Right handed    Retired. Prior rehab counselor. Resident at [facility] since about 2001.      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 Yes   Do you have difficulty preparing food or eating? Yes   Do you have difficulty managing your medications?  Yes   Do you have difficulty managing your finances? Yes   Do you have difficulty affording your medications? No    Functional Status Survey:    Family History  Problem Relation Age of Onset   Heart disease Mother    Transient ischemic attack Mother    Kidney failure Father    Alzheimer's disease Sister    AAA (abdominal aortic aneurysm) Sister    Lung cancer Brother    Memory loss Sister    Memory loss Sister    Heart disease Sister    Cancer Sister    Diabetes Sister    Cancer Sister     Health Maintenance  Topic Date Due   PNA vac Low Risk Adult (2 of 2 - PCV13) 04/20/2012   TETANUS/TDAP  04/22/2012   INFLUENZA VACCINE  04/09/2019    Allergies  Allergen Reactions   Tramadol Other (See Comments)    "does not eat" LOSS OF APPETITE    Allergies as of 03/24/2019      Reactions   Tramadol Other (See Comments)   "does not eat" LOSS OF APPETITE      Medication List       Accurate as of March 24, 2019 11:59 PM. If you have any questions, ask your nurse or doctor.        acetaminophen 500 MG tablet Commonly known as: TYLENOL Take 1,000 mg by mouth every 8 (eight) hours as needed.   aspirin EC 81 MG tablet Take 81 mg by mouth daily.   B-12 1000 MCG Tabs Take 1 tablet by mouth daily. Time released   CoQ10 100 MG Caps Take 1 capsule by mouth daily.   diphenhydrAMINE 25 MG tablet Commonly known as: BENADRYL Take 25 mg by mouth at bedtime as needed.   fexofenadine 180 MG tablet Commonly known as: ALLEGRA Take 180 mg by mouth daily as needed for allergies or rhinitis.   fluticasone 50 MCG/ACT nasal spray Commonly  known as: FLONASE Place 2 sprays into both nostrils as needed.   gabapentin 100 MG capsule Commonly known as: NEURONTIN Take 100 mg by mouth at bedtime.   GLUCOSAMINE CHOND TRIPLE/VIT D PO Take 2 tablets by mouth daily. With meals   meclizine 25 MG tablet Commonly known as: ANTIVERT Take 25 mg by mouth 3 (three) times daily as needed for dizziness.   Meloxicam 15 MG Tbdp Take 1 tablet by mouth as needed.   NAT-RUL PSYLLIUM SEED HUSKS PO Take 1-2 tablets by mouth as needed.   sodium chloride 0.65 % Soln nasal spray Commonly known as: OCEAN Place 2 sprays into both nostrils as needed for congestion.   tamsulosin 0.4 MG Caps capsule Commonly known as: FLOMAX Take 0.4 mg by mouth daily.       Review of Systems  Constitutional: Positive for unexpected weight change. Negative for activity change, appetite change, chills, diaphoresis, fatigue and fever.       Gradual weight loss, used to be in #200s Ibs  HENT: Positive for hearing loss. Negative for congestion and voice change.   Respiratory: Negative for cough, shortness of breath and wheezing.   Cardiovascular: Positive for leg swelling. Negative for chest pain and palpitations.       Thrombosed vein in the right gastrocnemius on [place] 07/24/16.   Gastrointestinal: Negative for abdominal distention, abdominal pain, constipation, diarrhea, nausea and vomiting.  Genitourinary: Negative for difficulty urinating, dysuria and urgency.  Musculoskeletal: Positive for back pain and gait problem.  Positional lower back pain  Skin: Negative for color change and pallor.  Neurological: Positive for numbness. Negative for dizziness, speech difficulty, weakness and headaches.       Memory lapse. Peripheral neuropathy BLE-tingling and numbness BLE  Psychiatric/Behavioral: Negative for agitation, behavioral problems, hallucinations and sleep disturbance. The patient is not nervous/anxious.     Vitals:   03/24/19 1511  BP: 110/64    Pulse: 67  Temp: 98.6 F (37 C)  TempSrc: Oral  SpO2: 96%  Weight: 179 lb 3.2 oz (81.3 kg)  Height: _0  (1.702 m)   Body mass index is 28.07 kg/m. Physical Exam Constitutional:      General: He is not in acute distress.    Appearance: Normal appearance. He is not ill-appearing, toxic-appearing or diaphoretic.     Comments: Over weight  HENT:     Head: Normocephalic and atraumatic.     Nose: Nose normal.     Mouth/Throat:     Mouth: Mucous membranes are moist.  Eyes:     Extraocular Movements: Extraocular movements intact.     Conjunctiva/sclera: Conjunctivae normal.     Pupils: Pupils are equal, round, and reactive to light.  Neck:     Musculoskeletal: Normal range of motion and neck supple.  Cardiovascular:     Rate and Rhythm: Normal rate and regular rhythm.     Heart sounds: No murmur.  Pulmonary:     Effort: Pulmonary effort is normal.     Breath sounds: No wheezing, rhonchi or rales.  Abdominal:     General: Abdomen is flat. Bowel sounds are normal. There is no distension.     Tenderness: There is no abdominal tenderness. There is no right CVA tenderness, left CVA tenderness or guarding.  Musculoskeletal:     Right lower leg: Edema present.     Left lower leg: Edema present.     Comments: Trace edema BLE L>R, ambulates with walker. Slightly reduced ROM of the left shoulder.   Skin:    General: Skin is warm and dry.  Neurological:     General: No focal deficit present.     Mental Status: He is alert and oriented to person, place, and time. Mental status is at baseline.     Cranial Nerves: No cranial nerve deficit.     Motor: No weakness.     Coordination: Coordination normal.     Gait: Gait abnormal.     Deep Tendon Reflexes: Reflexes normal.  Psychiatric:        Mood and Affect: Mood normal.        Behavior: Behavior normal.        Thought Content: Thought content normal.        Judgment: Judgment normal.     Labs reviewed: Basic Metabolic Panel: No  results for input(s): NA, K, CL, CO2, GLUCOSE, BUN, CREATININE, CALCIUM, MG, PHOS in the last 8760 hours. Liver Function Tests: No results for input(s): AST, ALT, ALKPHOS, BILITOT, PROT, ALBUMIN in the last 8760 hours. No results for input(s): LIPASE, AMYLASE in the last 8760 hours. No results for input(s): AMMONIA in the last 8760 hours. CBC: No results for input(s): WBC, NEUTROABS, HGB, HCT, MCV, PLT in the last 8760 hours. Cardiac Enzymes: No results for input(s): CKTOTAL, CKMB, CKMBINDEX, TROPONINI in the last 8760 hours. BNP: Invalid input(s): POCBNP No results found for: HGBA1C Lab Results  Component Value Date   TSH 2.44 07/29/2016   Lab Results  Component Value Date   [id number] 921  02/08/2018   No results found for: FOLATE No results found for: IRON, TIBC, FERRITIN  Imaging and Procedures obtained prior to SNF admission: Ct Head Wo Contrast  Result Date: 05/25/2017 CLINICAL DATA:  Fall, posterior head laceration, on anti coagulation EXAM: CT HEAD WITHOUT CONTRAST CT CERVICAL SPINE WITHOUT CONTRAST TECHNIQUE: Multidetector CT imaging of the head and cervical spine was performed following the standard protocol without intravenous contrast. Multiplanar CT image reconstructions of the cervical spine were also generated. COMPARISON:  None. FINDINGS: CT HEAD FINDINGS Brain: No evidence of acute infarction, hemorrhage, hydrocephalus, extra-axial collection or mass lesion/mass effect. Mild cortical atrophy. Subcortical white matter and periventricular small vessel ischemic changes. Vascular: Intracranial atherosclerosis. Skull: Normal. Negative for fracture or focal lesion. Sinuses/Orbits: The visualized paranasal sinuses are essentially clear. The mastoid air cells are unopacified. Other: Soft tissue swelling/hematoma overlying the right posterior vertex (series 4/ image 26). CT CERVICAL SPINE FINDINGS Alignment: Normal cervical lordosis. Skull base and vertebrae: No acute fracture. No  primary bone lesion or focal pathologic process. Soft tissues and spinal canal: No prevertebral fluid or swelling. No visible canal hematoma. Disc levels: Mild degenerative changes of the mid/lower cervical spine. Spinal canal is patent. Upper chest: Visualized lung apices are clear. Other: Visualized thyroid is unremarkable. IMPRESSION: Soft tissue swelling/hematoma overlying the right posterior vertex. No evidence of calvarial fracture. No evidence of acute intracranial abnormality. Mild atrophy with small vessel ischemic changes. No evidence of traumatic injury to the cervical spine. Mild degenerative changes. Electronically Signed   By: Julian Hy M.D.   On: 05/25/2017 13:44   Ct Cervical Spine Wo Contrast  Result Date: 05/25/2017 CLINICAL DATA:  Fall, posterior head laceration, on anti coagulation EXAM: CT HEAD WITHOUT CONTRAST CT CERVICAL SPINE WITHOUT CONTRAST TECHNIQUE: Multidetector CT imaging of the head and cervical spine was performed following the standard protocol without intravenous contrast. Multiplanar CT image reconstructions of the cervical spine were also generated. COMPARISON:  None. FINDINGS: CT HEAD FINDINGS Brain: No evidence of acute infarction, hemorrhage, hydrocephalus, extra-axial collection or mass lesion/mass effect. Mild cortical atrophy. Subcortical white matter and periventricular small vessel ischemic changes. Vascular: Intracranial atherosclerosis. Skull: Normal. Negative for fracture or focal lesion. Sinuses/Orbits: The visualized paranasal sinuses are essentially clear. The mastoid air cells are unopacified. Other: Soft tissue swelling/hematoma overlying the right posterior vertex (series 4/ image 26). CT CERVICAL SPINE FINDINGS Alignment: Normal cervical lordosis. Skull base and vertebrae: No acute fracture. No primary bone lesion or focal pathologic process. Soft tissues and spinal canal: No prevertebral fluid or swelling. No visible canal hematoma. Disc levels: Mild  degenerative changes of the mid/lower cervical spine. Spinal canal is patent. Upper chest: Visualized lung apices are clear. Other: Visualized thyroid is unremarkable. IMPRESSION: Soft tissue swelling/hematoma overlying the right posterior vertex. No evidence of calvarial fracture. No evidence of acute intracranial abnormality. Mild atrophy with small vessel ischemic changes. No evidence of traumatic injury to the cervical spine. Mild degenerative changes. Electronically Signed   By: Julian Hy M.D.   On: 05/25/2017 13:44    Assessment/Plan  Peripheral neuropathy Continue Gabapentin 158m qd.   Unilateral primary osteoarthritis, right knee Continue prn Tylenol 10052mq8h prn, Meloxicm 1580md prn.   Anemia 07/29/16 Vit B12 267, Hgb 10, continue Vit B12 1000m13md.   Urinary frequency BPH, 07/30/16 Urology started Tamsulosin 0.4mg 18m   Memory loss Mild, still functioning well [place] [facility], will update CBC/diff, CMP/eGFR, TSH, lipid panel, Vit B12, Vit D prior to the next appointment.  Weight loss CBC/diff, CMP/eGFR, lipid panel, TSH, Vit B12, Vit D  Sleep apnea CPAP-pending a new machine  Family/ staff Communication: plan of care reviewed with the patient  Labs/tests ordered: CBC/diff, CMP/eGFR, TSH, lipid panel, Vit B12, Vit D  Next appointment, Dr. Lyndel Safe 4 months.

## 2019-03-24 NOTE — Assessment & Plan Note (Signed)
BPH, 07/30/16 Urology started Tamsulosin 0.4mg  qd.

## 2019-03-24 NOTE — Patient Instructions (Addendum)
CBC/diff, CMP/eGFR, TSH, lipid panel, Vit B12, Vit D 03/31/19. Next appointment, Dr. Lyndel Safe 4 months.

## 2019-03-24 NOTE — Assessment & Plan Note (Signed)
CBC/diff, CMP/eGFR, lipid panel, TSH, Vit B12, Vit D

## 2019-03-24 NOTE — Assessment & Plan Note (Signed)
CPAP-pending a new machine

## 2019-03-24 NOTE — Assessment & Plan Note (Signed)
07/29/16 Vit B12 267, Hgb 10, continue Vit B12 1068mcg qd.

## 2019-03-24 NOTE — Assessment & Plan Note (Signed)
Mild, still functioning well [facility], will update CBC/diff, CMP/eGFR, TSH, lipid panel, Vit B12, Vit D prior to the next appointment.

## 2019-03-25 ENCOUNTER — Encounter: Payer: Self-pay | Admitting: Nurse Practitioner

## 2019-03-29 ENCOUNTER — Other Ambulatory Visit: Payer: Self-pay

## 2019-03-29 ENCOUNTER — Other Ambulatory Visit: Payer: Medicare Other

## 2019-03-29 DIAGNOSIS — R413 Other amnesia: Secondary | ICD-10-CM

## 2019-04-04 LAB — LIPID PANEL
Cholesterol: 175 mg/dL (ref <200)
HDL: 34 mg/dL — ABNORMAL LOW (ref >=40)
LDL Cholesterol (Calc): 121 mg/dL (calc) — ABNORMAL HIGH
Non-HDL Cholesterol (Calc): 141 mg/dL (calc) — ABNORMAL HIGH (ref <130)
Total CHOL/HDL Ratio: 5.1 (calc) — ABNORMAL HIGH (ref <5.0)
Triglycerides: 98 mg/dL (ref <150)

## 2019-04-04 LAB — CBC WITH DIFFERENTIAL/PLATELET
Absolute Monocytes: 274 cells/uL (ref 200–950)
Basophils Absolute: 30 cells/uL (ref 0–200)
Basophils Relative: 0.8 %
Eosinophils Absolute: 122 cells/uL (ref 15–500)
Eosinophils Relative: 3.2 %
HCT: 38.1 % — ABNORMAL LOW (ref 38.5–50.0)
Hemoglobin: 12.8 g/dL — ABNORMAL LOW (ref 13.2–17.1)
Lymphs Abs: 1091 cells/uL (ref 850–3900)
MCH: 30.3 pg (ref 27.0–33.0)
MCHC: 33.6 g/dL (ref 32.0–36.0)
MCV: 90.1 fL (ref 80.0–100.0)
MPV: 10.1 fL (ref 7.5–12.5)
Monocytes Relative: 7.2 %
Neutro Abs: 2284 cells/uL (ref 1500–7800)
Neutrophils Relative %: 60.1 %
Platelets: 187 10*3/uL (ref 140–400)
RBC: 4.23 10*6/uL (ref 4.20–5.80)
RDW: 14.5 % (ref 11.0–15.0)
Total Lymphocyte: 28.7 %
WBC: 3.8 10*3/uL (ref 3.8–10.8)

## 2019-04-04 LAB — COMPLETE METABOLIC PANEL WITH GFR
AG Ratio: 1.6 (calc) (ref 1.0–2.5)
ALT: 10 U/L (ref 9–46)
AST: 14 U/L (ref 10–35)
Albumin: 3.8 g/dL (ref 3.6–5.1)
Alkaline phosphatase (APISO): 86 U/L (ref 35–144)
BUN: 14 mg/dL (ref 7–25)
CO2: 24 mmol/L (ref 20–32)
Calcium: 9.1 mg/dL (ref 8.6–10.3)
Chloride: 108 mmol/L (ref 98–110)
Creat: 0.81 mg/dL (ref 0.70–1.11)
GFR, Est African American: 94 mL/min/{1.73_m2} (ref >=60)
GFR, Est Non African American: 81 mL/min/{1.73_m2} (ref >=60)
Globulin: 2.4 g/dL (calc) (ref 1.9–3.7)
Glucose, Bld: 84 mg/dL (ref 65–99)
Potassium: 3.8 mmol/L (ref 3.5–5.3)
Sodium: 143 mmol/L (ref 135–146)
Total Bilirubin: 1 mg/dL (ref 0.2–1.2)
Total Protein: 6.2 g/dL (ref 6.1–8.1)

## 2019-04-04 LAB — TSH: TSH: 4.27 mIU/L (ref 0.40–4.50)

## 2019-04-04 LAB — VITAMIN D 1,25 DIHYDROXY
Vitamin D 1, 25 (OH)2 Total: 23 pg/mL (ref 18–72)
Vitamin D2 1, 25 (OH)2: <8 pg/mL
Vitamin D3 1, 25 (OH)2: 23 pg/mL

## 2019-04-04 LAB — VITAMIN B12: Vitamin B-12: >2000 pg/mL — ABNORMAL HIGH (ref 200–1100)

## 2019-04-06 ENCOUNTER — Telehealth: Payer: Self-pay

## 2019-04-06 NOTE — Telephone Encounter (Signed)
When I called patient yesterday to provide lab results I was informed that he was in [place] and that the medication orders needed to be given to the [facility] nurse for his vitamin D and B12 change.  Please advise

## 2019-05-12 ENCOUNTER — Non-Acute Institutional Stay: Payer: Medicare Other | Admitting: Internal Medicine

## 2019-05-12 ENCOUNTER — Encounter: Payer: Self-pay | Admitting: Internal Medicine

## 2019-05-12 DIAGNOSIS — R350 Frequency of micturition: Secondary | ICD-10-CM | POA: Diagnosis not present

## 2019-05-12 NOTE — Progress Notes (Signed)
Location:  [facility] of Service:  ALF (13) Provider:    Mast, Man X, NP  Patient Care Team: Mast, Man X, NP as PCP - General (Internal Medicine) [name], [name], MD as Consulting Physician (Orthopedic Surgery) [name], MD as Consulting Physician (Gastroenterology) System, Provider Not In [name], MD as Consulting Physician (Neurosurgery) [name], MD as Referring Physician (Ophthalmology) Mast, Man X, NP as Nurse Practitioner (Internal Medicine) Almedia Balls, MD as Referring Physician (Orthopedic Surgery) [name], MD as Consulting Physician (Neurosurgery) Elsie Saas, MD as Consulting Physician (Orthopedic Surgery) Allyn Kenner, MD (Dermatology) Kathrynn Ducking, MD as Consulting Physician (Neurology) Franchot Gallo, MD as Consulting Physician (Urology)  Extended Emergency Contact Information Primary Emergency Contact: [name] Address: [address]          [place]          [place], [place] [address] [place] Phone: [phone] Relation: Spouse  Code Status:   Goals of care: Advanced Directive information Advanced Directives 03/24/2019  Does Patient Have a Medical Advance Directive? Yes  Type of Paramedic of [place];Living will;Out of facility DNR (pink MOST or yellow form)  Does patient want to make changes to medical advance directive? No - Patient declined  Copy of [place] in Chart? Yes - validated most recent copy scanned in chart (See row information)  Pre-existing out of facility DNR order (yellow form or pink MOST form) Yellow form placed in chart (order not valid for inpatient use)     Chief Complaint  Patient presents with  . Acute Visit    HPI:  Pt is a 85 y.o. male seen today for an acute visit for increased Urinary frequency. Patient has h/o peripheral neuropathy, osteoarthritis, mild dementia, sleep apnea and BPH   Patient does have a history of urinary frequency.  He has seen Dr. Diona Fanti his urologist before.  He is on terazosin for his BPH. He and his wife state today that patient has noticed increased frequency.  He denies any dysuria or any hematuria no fever lower abdominal pain. He said that he has to go every couple of hours especially at night   Past Medical History:  Diagnosis Date  . Abnormal MRI, knee    Per [facility] New Patient Packet   . Anemia   . Anticoagulated   . Anxiety   . Arthritis   . Cervical disc disorder    Per [facility] Patient Packet   . Complication of anesthesia    states "I was in [place] for several weeks after surgery"caused by tramadol  . Constipation   . Dysuria 07/28/2016  . Edema 07/24/2016  . Gait abnormality 07/25/2016  . Hard of hearing   . Head injury    As a result of a fall, [facility] Patient Packet   . Hip arthritis 07/21/2016  . History of fall 07/25/2016  . Hypercholesteremia   . Hypertension   . Lumbar spinal stenosis   . Memory loss 07/28/2016   mild  . Obesity    Per [facility] New Patient Packet   . Osteoarthritis of right hip 07/24/2016  . Peripheral neuropathy 03/10/2018  . Sleep apnea    cpap  . Spinal stenosis of lumbar region 05/28/2016  . Urinary frequency   . Venous thrombosis of leg 07/24/2016   right gastrocnemius  . Vertigo   . Wears glasses   . Weight loss 04/08/2016  Past Surgical History:  Procedure Laterality Date  . BACK SURGERY  10/17, 80's  . COLONOSCOPY    . COLONOSCOPY     Per [facility] New Patient Packet, [place]   . ESOPHAGOGASTRODUODENOSCOPY    . LUMBAR LAMINECTOMY/DECOMPRESSION MICRODISCECTOMY Right 05/28/2016   Procedure: Right Lumbar Three-Four Microdiscectomy;  Surgeon: [name], MD;  Location: [facility];  Service: Neurosurgery;  Laterality: Right;  . TOTAL HIP ARTHROPLASTY Right 07/21/2016   Procedure: TOTAL HIP ARTHROPLASTY ANTERIOR APPROACH;  Surgeon: [name], MD;  Location: [facility];  Service:  Orthopedics;  Laterality: Right;    Allergies  Allergen Reactions  . Tramadol Other (See Comments)    "does not eat" LOSS OF APPETITE    Outpatient Encounter Medications as of 05/12/2019  Medication Sig  . acetaminophen (TYLENOL) 500 MG tablet Take 1,000 mg by mouth every 8 (eight) hours as needed.  Marland Kitchen aspirin EC 81 MG tablet Take 81 mg by mouth daily.  . Coenzyme Q10 (COQ10) 100 MG CAPS Take 1 capsule by mouth daily.  . Cyanocobalamin (B-12) 1000 MCG TABS Take 1 tablet by mouth every other day. Time released   . diphenhydrAMINE (BENADRYL) 25 MG tablet Take 25 mg by mouth at bedtime as needed.  . fexofenadine (ALLEGRA) 180 MG tablet Take 180 mg by mouth daily as needed for allergies or rhinitis.  . fluticasone (FLONASE) 50 MCG/ACT nasal spray Place 2 sprays into both nostrils as needed.   . gabapentin (NEURONTIN) 100 MG capsule Take 100 mg by mouth at bedtime.  . Gluc-Chonn-MSM-Boswellia-Vit D (GLUCOSAMINE CHOND TRIPLE/VIT D PO) Take 2 tablets by mouth daily. With meals  . meclizine (ANTIVERT) 25 MG tablet Take 25 mg by mouth 3 (three) times daily as needed for dizziness.  . Meloxicam 15 MG TBDP Take 1 tablet by mouth as needed.  Marland Kitchen NAT-RUL PSYLLIUM SEED HUSKS PO Take 1-2 tablets by mouth as needed.  . sodium chloride (OCEAN) 0.65 % SOLN nasal spray Place 2 sprays into both nostrils as needed for congestion.  . tamsulosin (FLOMAX) 0.4 MG CAPS capsule Take 0.4 mg by mouth daily.    No facility-administered encounter medications on file as of 05/12/2019.     Review of Systems  Constitutional: Negative.   HENT: Negative.   Respiratory: Negative.   Cardiovascular: Positive for leg swelling.  Gastrointestinal: Negative.   Genitourinary: Positive for frequency. Negative for difficulty urinating, dysuria and hematuria.  Musculoskeletal: Positive for arthralgias and myalgias.  Neurological: Positive for weakness.  Psychiatric/Behavioral: Negative.   All other systems reviewed and are  negative.   Immunization History  Administered Date(s) Administered  . Influenza-Unspecified 06/11/2015  . Pneumococcal Polysaccharide-23 04/21/2011  . Td 04/22/2002  . Zoster 04/27/2012   Pertinent  Health Maintenance Due  Topic Date Due  . PNA vac Low Risk Adult (2 of 2 - PCV13) 04/20/2012  . INFLUENZA VACCINE  04/09/2019   Fall Risk  03/24/2019 02/08/2018 07/28/2016  Falls in the past year? 0 Yes Yes  Number falls in past yr: 0 1 1  Injury with Fall? 0 No No  Risk for fall due to : - - History of fall(s);Impaired balance/gait;Impaired mobility  Follow up - - Falls evaluation completed   Functional Status Survey:    Vitals:   05/12/19 1636  BP: (!) 146/80  Pulse: 74  Temp: 98.7 F (37.1 C)   There is no height or weight on file to calculate BMI. Physical Exam Vitals signs reviewed.  HENT:     Head:  Normocephalic.     Nose: Nose normal.     Mouth/Throat:     Mouth: Mucous membranes are moist.     Pharynx: Oropharynx is clear.  Eyes:     Pupils: Pupils are equal, round, and reactive to light.  Neck:     Musculoskeletal: Neck supple.  Cardiovascular:     Rate and Rhythm: Normal rate.     Pulses: Normal pulses.     Heart sounds: Normal heart sounds.  Pulmonary:     Effort: Pulmonary effort is normal.     Breath sounds: Normal breath sounds.  Abdominal:     General: Abdomen is flat. Bowel sounds are normal.     Palpations: Abdomen is soft.  Musculoskeletal:        General: Swelling present.  Skin:    General: Skin is warm and dry.  Neurological:     General: No focal deficit present.     Mental Status: He is alert.  Psychiatric:        Mood and Affect: Mood normal.        Thought Content: Thought content normal.     Labs reviewed: Recent Labs    03/31/19 0720  NA 143  K 3.8  CL 108  CO2 24  GLUCOSE 84  BUN 14  CREATININE 0.81  CALCIUM 9.1   Recent Labs    03/31/19 0720  AST 14  ALT 10  BILITOT 1.0  PROT 6.2   Recent Labs    03/31/19  0720  WBC 3.8  NEUTROABS 2,284  HGB 12.8*  HCT 38.1*  MCV 90.1  PLT 187   Lab Results  Component Value Date   TSH 4.27 03/31/2019   No results found for: HGBA1C Lab Results  Component Value Date   CHOL 175 03/31/2019   HDL 34 (L) 03/31/2019   LDLCALC 121 (H) 03/31/2019   TRIG 98 03/31/2019   CHOLHDL 5.1 (H) 03/31/2019    Significant Diagnostic Results in last 30 days:  No results found.  Assessment/Plan Urinary frequency We will check UA and Culture Send to Dr Beatrix Fetters [facility] for Follow up Continue on Terazosin    Family/ staff Communication:   Labs/tests ordered:   Total time spent in this patient care encounter was  25_  minutes; greater than 50% of the visit spent counseling patient and staff, reviewing records , Labs and coordinating care for problems addressed at this encounter.

## 2019-07-29 ENCOUNTER — Encounter: Payer: Medicare Other | Admitting: Internal Medicine

## 2019-07-29 ENCOUNTER — Other Ambulatory Visit: Payer: Self-pay

## 2019-08-02 ENCOUNTER — Non-Acute Institutional Stay: Payer: Medicare Other | Admitting: Internal Medicine

## 2019-08-02 ENCOUNTER — Encounter: Payer: Self-pay | Admitting: Internal Medicine

## 2019-08-02 DIAGNOSIS — G8929 Other chronic pain: Secondary | ICD-10-CM

## 2019-08-02 DIAGNOSIS — R350 Frequency of micturition: Secondary | ICD-10-CM | POA: Diagnosis not present

## 2019-08-02 DIAGNOSIS — G609 Hereditary and idiopathic neuropathy, unspecified: Secondary | ICD-10-CM

## 2019-08-02 DIAGNOSIS — R600 Localized edema: Secondary | ICD-10-CM

## 2019-08-02 DIAGNOSIS — M25562 Pain in left knee: Secondary | ICD-10-CM | POA: Diagnosis not present

## 2019-08-02 DIAGNOSIS — E7800 Pure hypercholesterolemia, unspecified: Secondary | ICD-10-CM | POA: Diagnosis not present

## 2019-08-02 NOTE — Progress Notes (Signed)
Location:    [facility] Room Number: 903 Place of Service:  ALF (13) Provider:  Veleta Miners MD  Mast, Man X, NP  Patient Care Team: Mast, Man X, NP as PCP - General (Internal Medicine) [name], [name], MD as Consulting Physician (Orthopedic Surgery) [name], MD as Consulting Physician (Gastroenterology) System, Provider Not In [name], MD as Consulting Physician (Neurosurgery) [name], MD as Referring Physician (Ophthalmology) Mast, Man X, NP as Nurse Practitioner (Internal Medicine) Almedia Balls, MD as Referring Physician (Orthopedic Surgery) [name], MD as Consulting Physician (Neurosurgery) Elsie Saas, MD as Consulting Physician (Orthopedic Surgery) Allyn Kenner, MD (Dermatology) Kathrynn Ducking, MD as Consulting Physician (Neurology) Franchot Gallo, MD as Consulting Physician (Urology)  Extended Emergency Contact Information Primary Emergency Contact: [name] Address: [address]          [place], [place] [address] [place] Phone: [phone] Relation: Spouse  Code Status:  Full Code Goals of care: Advanced Directive information Advanced Directives 03/24/2019  Does Patient Have a Medical Advance Directive? Yes  Type of Paramedic of [place];Living will;Out of facility DNR (pink MOST or yellow form)  Does patient want to make changes to medical advance directive? No - Patient declined  Copy of [place] in Chart? Yes - validated most recent copy scanned in chart (See row information)  Pre-existing out of facility DNR order (yellow form or pink MOST form) Yellow form placed in chart (order not valid for inpatient use)     Chief Complaint  Patient presents with   Medical Management of Chronic Issues   Health Maintenance    PCV13, TDAP    HPI:  Pt is a 85 y.o. male seen today for medical management of chronic diseases.     Patient has h/o Peripheral Neuropathy Idiopathic, BPH with urinary Frequency, LE edema,  Arthritis, Sleep Apnea, Hyperlipidemia  Patient was seen for regular Visit His main complains are Urinary Frequency Was seen by Urology. Taken off Flomax Continues to Be issues Left Knee arthritis Occasional   Pain More issue that he needs to use walker to walk Idiopathic Neuropathy Follows with Dr Jannifer Franklin Symptoms controlled on Neurontin  Lives with his wife in [place]. No Falls recently No Cough or any other issues   Past Medical History:  Diagnosis Date   Abnormal MRI, knee    Per [facility] New Patient Packet    Anemia    Anticoagulated    Anxiety    Arthritis    Cervical disc disorder    Per [facility] New Patient Packet    Complication of anesthesia    states "I was in [place] for several weeks after surgery"caused by tramadol   Constipation    Dysuria 07/28/2016   Edema 07/24/2016   Gait abnormality 07/25/2016   Hard of hearing    Head injury    As a result of a fall, Per [facility] New Patient Packet    Hip arthritis 07/21/2016   History of fall 07/25/2016   Hypercholesteremia    Hypertension    Lumbar spinal stenosis    Memory loss 07/28/2016   mild   Obesity    Per [facility] New Patient Packet    Osteoarthritis of right hip 07/24/2016   Peripheral neuropathy 03/10/2018   Sleep apnea    cpap   Spinal stenosis of lumbar region 05/28/2016   Urinary frequency  Venous thrombosis of leg 07/24/2016   right gastrocnemius   Vertigo    Wears glasses    Weight loss 04/08/2016   Past Surgical History:  Procedure Laterality Date   BACK SURGERY  10/17, 80's   COLONOSCOPY     COLONOSCOPY     Per [facility] Patient Packet, Dr.Schooler    ESOPHAGOGASTRODUODENOSCOPY     LUMBAR LAMINECTOMY/DECOMPRESSION MICRODISCECTOMY Right 05/28/2016   Procedure: Right Lumbar Three-Four Microdiscectomy;  Surgeon: [name], MD;  Location: [facility];  Service: Neurosurgery;   Laterality: Right;   TOTAL HIP ARTHROPLASTY Right 07/21/2016   Procedure: TOTAL HIP ARTHROPLASTY ANTERIOR APPROACH;  Surgeon: [name], MD;  Location: [facility];  Service: Orthopedics;  Laterality: Right;    Allergies  Allergen Reactions   Tramadol Other (See Comments)    "does not eat" LOSS OF APPETITE    Allergies as of 08/02/2019      Reactions   Tramadol Other (See Comments)   "does not eat" LOSS OF APPETITE      Medication List       Accurate as of August 02, 2019  8:58 AM. If you have any questions, ask your nurse or doctor.        STOP taking these medications   tamsulosin 0.4 MG Caps capsule Commonly known as: FLOMAX Stopped by: Virgie Dad, MD     TAKE these medications   acetaminophen 500 MG tablet Commonly known as: TYLENOL Take 1,000 mg by mouth every 8 (eight) hours as needed.   aspirin EC 81 MG tablet Take 81 mg by mouth daily.   B-12 1000 MCG Tabs Take 1 tablet by mouth every other day. Time released   CoQ10 100 MG Caps Take 1 capsule by mouth daily.   diphenhydrAMINE 25 MG tablet Commonly known as: BENADRYL Take 25 mg by mouth at bedtime as needed.   fexofenadine 180 MG tablet Commonly known as: ALLEGRA Take 180 mg by mouth daily as needed for allergies or rhinitis.   fluticasone 50 MCG/ACT nasal spray Commonly known as: FLONASE Place 2 sprays into both nostrils as needed.   gabapentin 100 MG capsule Commonly known as: NEURONTIN Take 100 mg by mouth at bedtime.   GLUCOSAMINE CHOND TRIPLE/VIT D PO take 3 tabs to = 1500 mg Once A Day   meclizine 25 MG tablet Commonly known as: ANTIVERT Take 25 mg by mouth 3 (three) times daily as needed for dizziness.   Meloxicam 15 MG Tbdp Take 0.5 tablets by mouth as needed. 7.5 mg as needed   NAT-RUL PSYLLIUM SEED HUSKS PO Take 1-2 tablets by mouth as needed.   sodium chloride 0.65 % Soln nasal spray Commonly known as: OCEAN Place 2 sprays into both nostrils as needed for  congestion.       Review of Systems  Constitutional: Negative.   HENT: Negative.   Respiratory: Negative.   Cardiovascular: Positive for leg swelling.  Gastrointestinal: Negative.   Genitourinary: Positive for frequency.  Musculoskeletal: Positive for arthralgias, gait problem and myalgias.  Psychiatric/Behavioral: Negative.   All other systems reviewed and are negative.   Immunization History  Administered Date(s) Administered   Influenza-Unspecified 06/11/2015   Pneumococcal Polysaccharide-23 04/21/2011   Td 04/22/2002   Zoster 04/27/2012   Pertinent  Health Maintenance Due  Topic Date Due   PNA vac Low Risk Adult (2 of 2 - PCV13) 04/20/2012   INFLUENZA VACCINE  Completed   Fall Risk  03/24/2019 02/08/2018 07/28/2016  Falls in the past  year? 0 Yes Yes  Number falls in past yr: 0 1 1  Injury with Fall? 0 No No  Risk for fall due to : - - History of fall(s);Impaired balance/gait;Impaired mobility  Follow up - - Falls evaluation completed   Functional Status Survey:    Vitals:   08/02/19 0848  BP: 122/68  Pulse: 66  Resp: 20  Temp: 97.9 F (36.6 C)  SpO2: 96%  Weight: 186 lb 12.8 oz (84.7 kg)  Height: 5\' 7"  (1.702 m)   Body mass index is 29.26 kg/m. Physical Exam Vitals signs reviewed.  Constitutional:      Appearance: Normal appearance.  HENT:     Head: Normocephalic.     Nose: Nose normal.     Mouth/Throat:     Mouth: Mucous membranes are moist.     Pharynx: Oropharynx is clear.  Eyes:     Pupils: Pupils are equal, round, and reactive to light.  Neck:     Musculoskeletal: Neck supple.  Cardiovascular:     Rate and Rhythm: Normal rate and regular rhythm.     Heart sounds: No murmur.  Pulmonary:     Effort: Pulmonary effort is normal. No respiratory distress.     Breath sounds: Normal breath sounds. No wheezing.  Abdominal:     General: Abdomen is flat. Bowel sounds are normal. There is no distension.     Palpations: Abdomen is soft.      Tenderness: There is no abdominal tenderness.  Musculoskeletal:        General: Swelling present.  Skin:    General: Skin is warm and dry.  Neurological:     General: No focal deficit present.     Mental Status: He is alert and oriented to person, place, and time.     Comments: Was able to stand and Walk with his walker  Psychiatric:        Mood and Affect: Mood normal.        Thought Content: Thought content normal.     Labs reviewed: Recent Labs    03/31/19 0720  NA 143  K 3.8  CL 108  CO2 24  GLUCOSE 84  BUN 14  CREATININE 0.81  CALCIUM 9.1   Recent Labs    03/31/19 0720  AST 14  ALT 10  BILITOT 1.0  PROT 6.2   Recent Labs    03/31/19 0720  WBC 3.8  NEUTROABS 2,284  HGB 12.8*  HCT 38.1*  MCV 90.1  PLT 187   Lab Results  Component Value Date   TSH 4.27 03/31/2019   No results found for: HGBA1C Lab Results  Component Value Date   CHOL 175 03/31/2019   HDL 34 (L) 03/31/2019   LDLCALC 121 (H) 03/31/2019   TRIG 98 03/31/2019   CHOLHDL 5.1 (H) 03/31/2019    Significant Diagnostic Results in last 30 days:  No results found.  Assessment/Plan Urinary frequency UA before has been negative Follow up with Urology Idiopathic peripheral neuropathy Doing well on Neurontin  Chronic pain of left knee On Meloxicam PRN Has seen Ortho before not candidate for Replacement  Hypercholesteremia Repeat Fasting Lipid  Bilateral leg edema Does not want Lasix as he already has Frequency    Family/ staff Communication:   Labs/tests ordered:  CMP,CBC,Fasting Lipid, TSH  Total time spent in this patient care encounter was  40_  minutes; greater than 50% of the visit spent counseling patient and staff, reviewing records , Labs and coordinating care for  problems addressed at this encounter.

## 2019-08-10 ENCOUNTER — Encounter: Payer: Self-pay | Admitting: Nurse Practitioner

## 2019-08-10 ENCOUNTER — Non-Acute Institutional Stay: Payer: Medicare Other | Admitting: Nurse Practitioner

## 2019-08-10 DIAGNOSIS — E7800 Pure hypercholesterolemia, unspecified: Secondary | ICD-10-CM | POA: Diagnosis not present

## 2019-08-10 DIAGNOSIS — G609 Hereditary and idiopathic neuropathy, unspecified: Secondary | ICD-10-CM

## 2019-08-10 DIAGNOSIS — E039 Hypothyroidism, unspecified: Secondary | ICD-10-CM | POA: Insufficient documentation

## 2019-08-10 LAB — LIPID PANEL
Cholesterol: 238 — AB (ref 0–200)
HDL: 36 (ref 35–70)
LDL Cholesterol: 175
LDl/HDL Ratio: 6.6
Triglycerides: 136 (ref 40–160)

## 2019-08-10 LAB — CBC AND DIFFERENTIAL
HCT: 43 (ref 41–53)
Hemoglobin: 14.2 (ref 13.5–17.5)
Neutrophils Absolute: 2773
Platelets: 210 (ref 150–399)
WBC: 4.9

## 2019-08-10 LAB — BASIC METABOLIC PANEL
BUN: 17 (ref 4–21)
CO2: 28 — AB (ref 13–22)
Chloride: 104 (ref 99–108)
Creatinine: 0.9 (ref 0.6–1.3)
Glucose: 86
Potassium: 4.1 (ref 3.4–5.3)
Sodium: 140 (ref 137–147)

## 2019-08-10 LAB — HEPATIC FUNCTION PANEL
ALT: 9 — AB (ref 10–40)
AST: 15 (ref 14–40)
Alkaline Phosphatase: 79 (ref 25–125)
Bilirubin, Total: 0.5

## 2019-08-10 LAB — CBC: RBC: 4.72 (ref 3.87–5.11)

## 2019-08-10 LAB — TSH: TSH: 6.78 — AB (ref 0.41–5.90)

## 2019-08-10 NOTE — Assessment & Plan Note (Signed)
LDL 175 08/09/19, will  Start Atorvatatin 20mg  qd, repeat lipid panel in 3 months.

## 2019-08-10 NOTE — Progress Notes (Signed)
Location:   [place] Room Number: 902 Place of Service:  ALF (13) Provider: Lennie Odor Norlene Lanes NP  Minda Faas X, NP  Patient Care Team: Meleana Commerford X, NP as PCP - General (Internal Medicine) [name], [name], MD as Consulting Physician (Orthopedic Surgery) [name], MD as Consulting Physician (Gastroenterology) System, Provider Not In [name], MD as Consulting Physician (Neurosurgery) [name], MD as Referring Physician (Ophthalmology) Yuriel Lopezmartinez X, NP as Nurse Practitioner (Internal Medicine) Almedia Balls, MD as Referring Physician (Orthopedic Surgery) [name], MD as Consulting Physician (Neurosurgery) Elsie Saas, MD as Consulting Physician (Orthopedic Surgery) Allyn Kenner, MD (Dermatology) Kathrynn Ducking, MD as Consulting Physician (Neurology) Franchot Gallo, MD as Consulting Physician (Urology)  Extended Emergency Contact Information Primary Emergency Contact: [name] Address: [address]          [place], [place] [address] [place] Phone: [phone] Relation: Spouse  Code Status: DNR Goals of care: Advanced Directive information Advanced Directives 03/24/2019  Does Patient Have a Medical Advance Directive? Yes  Type of Paramedic of [place];Living will;Out of facility DNR (pink MOST or yellow form)  Does patient want to make changes to medical advance directive? No - Patient declined  Copy of [place] in Chart? Yes - validated most recent copy scanned in chart (See row information)  Pre-existing out of facility DNR order (yellow form or pink MOST form) Yellow form placed in chart (order not valid for inpatient use)     Chief Complaint  Patient presents with  . Acute Visit    elevated TSH, hyperlipidemia.     HPI:  Pt is a 85 y.o. male seen today for an acute visit for elevated TSH 6.78 08/09/19, trended up from 4.27 03/31/19. The  patient denied changes in thinking/focusin, mood, speech, movement, voice, energy, muscle strength, bowel habit, skin/nails, or tolerance to cold. He stated he had issue with his thyroid at teenage, some kind bed taste medicine to take-? Iodine. He desires repeat TSH. Elevated LDL 175 08/09/19. Hx of peripheral neuropathy, on Gabapentin 100mg  qd.    Past Medical History:  Diagnosis Date  . Abnormal MRI, knee    Per [facility] New Patient Packet   . Anemia   . Anticoagulated   . Anxiety   . Arthritis   . Cervical disc disorder    Per [facility] Patient Packet   . Complication of anesthesia    states "I was [place] several weeks after surgery"caused by tramadol  . Constipation   . Dysuria 07/28/2016  . Edema 07/24/2016  . Gait abnormality 07/25/2016  . Hard of hearing   . Head injury    As a result of a fall, Per [facility] New Patient Packet   . Hip arthritis 07/21/2016  . History of fall 07/25/2016  . Hypercholesteremia   . Hypertension   . Lumbar spinal stenosis   . Memory loss 07/28/2016   mild  . Obesity    Per [facility] New Patient Packet   . Osteoarthritis of right hip 07/24/2016  . Peripheral neuropathy 03/10/2018  . Sleep apnea    cpap  . Spinal stenosis of lumbar region 05/28/2016  . Urinary frequency   . Venous thrombosis of leg 07/24/2016   right gastrocnemius  . Vertigo   . Wears glasses   . Weight loss 04/08/2016   Past Surgical History:  Procedure  Laterality Date  . BACK SURGERY  10/17, 80's  . COLONOSCOPY    . COLONOSCOPY     Per [facility] New Patient Packet, [place]   . ESOPHAGOGASTRODUODENOSCOPY    . LUMBAR LAMINECTOMY/DECOMPRESSION MICRODISCECTOMY Right 05/28/2016   Procedure: Right Lumbar Three-Four Microdiscectomy;  Surgeon: [name], MD;  Location: [facility];  Service: Neurosurgery;  Laterality: Right;  . TOTAL HIP ARTHROPLASTY Right 07/21/2016   Procedure: TOTAL HIP ARTHROPLASTY ANTERIOR APPROACH;  Surgeon: [name], MD;  Location: [facility];  Service:  Orthopedics;  Laterality: Right;    Allergies  Allergen Reactions  . Tramadol Other (See Comments)    "does not eat" LOSS OF APPETITE    Allergies as of 08/10/2019      Reactions   Tramadol Other (See Comments)   "does not eat" LOSS OF APPETITE      Medication List       Accurate as of August 10, 2019  2:23 PM. If you have any questions, ask your nurse or doctor.        acetaminophen 500 MG tablet Commonly known as: TYLENOL Take 1,000 mg by mouth every 8 (eight) hours as needed.   aspirin EC 81 MG tablet Take 81 mg by mouth daily.   B-12 1000 MCG Tabs Take 1 tablet by mouth every other day. Time released   CoQ10 100 MG Caps Take 1 capsule by mouth daily.   diphenhydrAMINE 25 MG tablet Commonly known as: BENADRYL Take 25 mg by mouth at bedtime as needed.   fexofenadine 180 MG tablet Commonly known as: ALLEGRA Take 180 mg by mouth daily as needed for allergies or rhinitis.   fluticasone 50 MCG/ACT nasal spray Commonly known as: FLONASE Place 2 sprays into both nostrils as needed.   gabapentin 100 MG capsule Commonly known as: NEURONTIN Take 100 mg by mouth at bedtime.   GLUCOSAMINE CHOND TRIPLE/VIT D PO take 3 tabs to = 1500 mg Once A Day   meclizine 25 MG tablet Commonly known as: ANTIVERT Take 25 mg by mouth 3 (three) times daily as needed for dizziness.   Meloxicam 15 MG Tbdp Take 0.5 tablets by mouth as needed. 7.5 mg as needed   NAT-RUL PSYLLIUM SEED HUSKS PO Take 1-2 tablets by mouth as needed.   sodium chloride 0.65 % Soln nasal spray Commonly known as: OCEAN Place 2 sprays into both nostrils as needed for congestion.      ROS was provided with assistance of staff and the patient's wife.  Review of Systems  Constitutional: Negative for activity change, appetite change, chills, diaphoresis, fatigue and fever.  HENT: Positive for hearing loss. Negative for congestion and voice change.   Respiratory: Negative for cough, shortness of  breath and wheezing.   Cardiovascular: Positive for leg swelling. Negative for chest pain and palpitations.  Gastrointestinal: Negative for abdominal distention, abdominal pain, constipation, diarrhea, nausea and vomiting.  Genitourinary: Positive for frequency. Negative for difficulty urinating, dysuria and urgency.  Musculoskeletal: Positive for arthralgias and gait problem.  Skin: Negative for color change and pallor.  Neurological: Negative for dizziness, facial asymmetry, speech difficulty, weakness and headaches.       Memory lapses.   Psychiatric/Behavioral: Negative for agitation, behavioral problems, hallucinations and sleep disturbance. The patient is not nervous/anxious.     Immunization History  Administered Date(s) Administered  . Influenza-Unspecified 06/11/2015  . Pneumococcal Polysaccharide-23 04/21/2011  . Td 04/22/2002  . Zoster 04/27/2012   Pertinent  Health Maintenance Due  Topic Date Due  .  PNA vac Low Risk Adult (2 of 2 - PCV13) 04/20/2012  . INFLUENZA VACCINE  Completed   Fall Risk  03/24/2019 02/08/2018 07/28/2016  Falls in the past year? 0 Yes Yes  Number falls in past yr: 0 1 1  Injury with Fall? 0 No No  Risk for fall due to : - - History of fall(s);Impaired balance/gait;Impaired mobility  Follow up - - Falls evaluation completed   Functional Status Survey:    Vitals:   08/10/19 1405  BP: 132/60  Pulse: 66  Resp: 18  Temp: 98.2 F (36.8 C)   There is no height or weight on file to calculate BMI. Physical Exam Vitals signs and nursing note reviewed.  Constitutional:      General: He is not in acute distress.    Appearance: Normal appearance. He is not ill-appearing, toxic-appearing or diaphoretic.  HENT:     Head: Normocephalic and atraumatic.     Nose: Nose normal.     Mouth/Throat:     Mouth: Mucous membranes are moist.  Eyes:     Extraocular Movements: Extraocular movements intact.     Conjunctiva/sclera: Conjunctivae normal.      Pupils: Pupils are equal, round, and reactive to light.  Neck:     Musculoskeletal: Normal range of motion and neck supple.  Cardiovascular:     Rate and Rhythm: Normal rate and regular rhythm.     Heart sounds: No murmur.  Pulmonary:     Breath sounds: No wheezing, rhonchi or rales.  Abdominal:     General: Bowel sounds are normal. There is no distension.     Palpations: Abdomen is soft.     Tenderness: There is no abdominal tenderness. There is no right CVA tenderness, left CVA tenderness, guarding or rebound.  Musculoskeletal:     Right lower leg: Edema present.     Left lower leg: Edema present.     Comments: Trace edema BLE  Skin:    General: Skin is warm and dry.  Neurological:     General: No focal deficit present.     Mental Status: He is alert and oriented to person, place, and time. Mental status is at baseline.     Motor: No weakness.     Coordination: Coordination normal.     Gait: Gait abnormal.  Psychiatric:        Mood and Affect: Mood normal.        Behavior: Behavior normal.        Thought Content: Thought content normal.        Judgment: Judgment normal.     Labs reviewed: Recent Labs    03/31/19 0720  NA 143  K 3.8  CL 108  CO2 24  GLUCOSE 84  BUN 14  CREATININE 0.81  CALCIUM 9.1   Recent Labs    03/31/19 0720  AST 14  ALT 10  BILITOT 1.0  PROT 6.2   Recent Labs    03/31/19 0720  WBC 3.8  NEUTROABS 2,284  HGB 12.8*  HCT 38.1*  MCV 90.1  PLT 187   Lab Results  Component Value Date   TSH 4.27 03/31/2019   No results found for: HGBA1C Lab Results  Component Value Date   CHOL 175 03/31/2019   HDL 34 (L) 03/31/2019   LDLCALC 121 (H) 03/31/2019   TRIG 98 03/31/2019   CHOLHDL 5.1 (H) 03/31/2019    Significant Diagnostic Results in last 30 days:  No results found.  Assessment/Plan: Hypothyroidism  TSH 6.78 08/09/19, trended up from 4.27 03/31/19. The patient denied changes in thinking/focusin, mood, speech, movement, voice,  energy, muscle strength, bowel habit, skin/nails, or tolerance to cold. He stated he had issue with his thyroid at teenage, some kind bed taste medicine to take-? Iodine. He desires repeat TSH in 6 weeks.   Hypercholesteremia LDL 175 08/09/19, will  Start Atorvatatin 20mg  qd, repeat lipid panel in 3 months.   Peripheral neuropathy F/u neurology, continue Gabapentin, ambulate with walker.     Family/ staff Communication: plan of care reviewed with the patient and charge nurse.   Labs/tests ordered:  TSH 6 weeks, lipid panel 3 months  Time spend 40 minutes.

## 2019-08-10 NOTE — Assessment & Plan Note (Signed)
F/u neurology, continue Gabapentin, ambulate with walker.

## 2019-08-10 NOTE — Assessment & Plan Note (Signed)
TSH 6.78 08/09/19, trended up from 4.27 03/31/19. The patient denied changes in thinking/focusin, mood, speech, movement, voice, energy, muscle strength, bowel habit, skin/nails, or tolerance to cold. He stated he had issue with his thyroid at teenage, some kind bed taste medicine to take-? Iodine. He desires repeat TSH in 6 weeks.

## 2019-08-29 ENCOUNTER — Non-Acute Institutional Stay: Payer: Medicare Other | Admitting: Nurse Practitioner

## 2019-08-29 ENCOUNTER — Encounter: Payer: Self-pay | Admitting: Nurse Practitioner

## 2019-08-29 DIAGNOSIS — R269 Unspecified abnormalities of gait and mobility: Secondary | ICD-10-CM | POA: Diagnosis not present

## 2019-08-29 DIAGNOSIS — M1711 Unilateral primary osteoarthritis, right knee: Secondary | ICD-10-CM

## 2019-08-29 DIAGNOSIS — Z9181 History of falling: Secondary | ICD-10-CM

## 2019-08-29 DIAGNOSIS — G609 Hereditary and idiopathic neuropathy, unspecified: Secondary | ICD-10-CM | POA: Diagnosis not present

## 2019-08-29 DIAGNOSIS — R413 Other amnesia: Secondary | ICD-10-CM

## 2019-08-29 NOTE — Progress Notes (Signed)
Location:     [facility] Room Number: [place]:  ALF (13) Provider: Doneshia Hill X, NP  [name], NP  Patient Care Team: Koty Anctil X, NP as PCP - General (Internal Medicine) [name], [name], MD as Consulting Physician (Orthopedic Surgery) [name], MD as Consulting Physician (Gastroenterology) System, Provider Not In [name], MD as Consulting Physician (Neurosurgery) [name], MD as Referring Physician (Ophthalmology) Bailea Beed X, NP as Nurse Practitioner (Internal Medicine) Almedia Balls, MD as Referring Physician (Orthopedic Surgery) [name], MD as Consulting Physician (Neurosurgery) Elsie Saas, MD as Consulting Physician (Orthopedic Surgery) Allyn Kenner, MD (Dermatology) Kathrynn Ducking, MD as Consulting Physician (Neurology) Franchot Gallo, MD as Consulting Physician (Urology)  Extended Emergency Contact Information Primary Emergency Contact: [name] Address: [address]          [place], [place] [address] [place] Phone: [phone] Relation: Spouse  Code Status:  Full Code Goals of care: Advanced Directive information Advanced Directives 03/24/2019  Does Patient Have a Medical Advance Directive? Yes  Type of Paramedic of [place];Living will;Out of facility DNR (pink MOST or yellow form)  Does patient want to make changes to medical advance directive? No - Patient declined  Copy of [place] in Chart? Yes - validated most recent copy scanned in chart (See row information)  Pre-existing out of facility DNR order (yellow form or pink MOST form) Yellow form placed in chart (order not valid for inpatient use)     Chief Complaint  Patient presents with  . Acute Visit    Fall    HPI:  Pt is a 85 y.o. male seen today for an acute visit for fall 08/28/19, the resident was found sitting on floor  beside dresser on buttock, stated he lost balance when puttin gon his pants. The patient resides in [facility] for safety, care assistance, ambulates with walker for short distance, w/c to go further. Peripheral neuropathy, left knee brace, lower body weakness, on Gabapentin 100mg  every day, prn Tylenol 1000mg  q8hr. OA multiple sites, prn Meloxicam 7.5mg  every day.    Past Medical History:  Diagnosis Date  . Abnormal MRI, knee    Per [facility] New Patient Packet   . Anemia   . Anticoagulated   . Anxiety   . Arthritis   . Cervical disc disorder    Per [facility] Patient Packet   . Complication of anesthesia    states "I was in [place] for several weeks after surgery"caused by tramadol  . Constipation   . Dysuria 07/28/2016  . Edema 07/24/2016  . Gait abnormality 07/25/2016  . Hard of hearing   . Head injury    As a result of a fall, Per [facility] New Patient Packet   . Hip arthritis 07/21/2016  . History of fall 07/25/2016  . Hypercholesteremia   . Hypertension   . Lumbar spinal stenosis   . Memory loss 07/28/2016   mild  . Obesity    Per [facility] New Patient Packet   . Osteoarthritis of right hip 07/24/2016  . Peripheral neuropathy 03/10/2018  . Sleep apnea    cpap  . Spinal stenosis of lumbar region 05/28/2016  . Urinary frequency   . Venous thrombosis of leg 07/24/2016   right gastrocnemius  . Vertigo   . Wears glasses   . Weight  loss 04/08/2016   Past Surgical History:  Procedure Laterality Date  . BACK SURGERY  10/17, 80's  . COLONOSCOPY    . COLONOSCOPY     Per [facility] New Patient Packet, [place]   . ESOPHAGOGASTRODUODENOSCOPY    . LUMBAR LAMINECTOMY/DECOMPRESSION MICRODISCECTOMY Right 05/28/2016   Procedure: Right Lumbar Three-Four Microdiscectomy;  Surgeon: [name], MD;  Location: [place] [facility];  Service: Neurosurgery;  Laterality: Right;  . TOTAL HIP ARTHROPLASTY Right 07/21/2016   Procedure: TOTAL HIP ARTHROPLASTY ANTERIOR APPROACH;  Surgeon: [name], MD;  Location:  [place];  Service: Orthopedics;  Laterality: Right;    Allergies  Allergen Reactions  . Tramadol Other (See Comments)    "does not eat" LOSS OF APPETITE    Allergies as of 08/29/2019      Reactions   Tramadol Other (See Comments)   "does not eat" LOSS OF APPETITE      Medication List       Accurate as of August 29, 2019 11:59 PM. If you have any questions, ask your nurse or doctor.        acetaminophen 500 MG tablet Commonly known as: TYLENOL Take 1,000 mg by mouth every 8 (eight) hours as needed.   aspirin EC 81 MG tablet Take 81 mg by mouth daily.   atorvastatin 20 MG tablet Commonly known as: LIPITOR Take 20 mg by mouth daily.   B-12 1000 MCG Tabs Take 1 tablet by mouth every other day. Time released   CoQ10 100 MG Caps Take 1 capsule by mouth daily.   diphenhydrAMINE 25 MG tablet Commonly known as: BENADRYL Take 25 mg by mouth at bedtime as needed.   fexofenadine 180 MG tablet Commonly known as: ALLEGRA Take 180 mg by mouth daily as needed for allergies or rhinitis.   fluticasone 50 MCG/ACT nasal spray Commonly known as: FLONASE Place 2 sprays into both nostrils as needed.   gabapentin 100 MG capsule Commonly known as: NEURONTIN Take 100 mg by mouth at bedtime.   GLUCOSAMINE CHOND TRIPLE/VIT D PO take 3 tabs to = 1500 mg Once A Day   meclizine 25 MG tablet Commonly known as: ANTIVERT Take 25 mg by mouth 3 (three) times daily as needed for dizziness.   Meloxicam 15 MG Tbdp Take 0.5 tablets by mouth as needed. 7.5 mg as needed   NAT-RUL PSYLLIUM SEED HUSKS PO Take 1-2 tablets by mouth as needed.   sodium chloride 0.65 % Soln nasal spray Commonly known as: OCEAN Place 2 sprays into both nostrils as needed for congestion.      ROS was provided with assistance of staff.  Review of Systems  Constitutional: Negative for activity change, appetite change, chills, diaphoresis, fatigue and fever.  HENT: Positive for hearing loss. Negative for  congestion and voice change.   Eyes: Negative for visual disturbance.  Respiratory: Negative for cough, shortness of breath and wheezing.   Cardiovascular: Positive for leg swelling. Negative for chest pain and palpitations.  Gastrointestinal: Negative for abdominal distention, abdominal pain, constipation, diarrhea, nausea and vomiting.  Genitourinary: Positive for frequency. Negative for difficulty urinating, dysuria and urgency.  Musculoskeletal: Positive for arthralgias and gait problem.  Skin: Negative for color change and pallor.  Neurological: Positive for weakness. Negative for dizziness, speech difficulty and headaches.       Memory lapses. Peripheral neuropathy, low body weakness.   Psychiatric/Behavioral: Negative for agitation, behavioral problems, hallucinations and sleep disturbance. The patient is not nervous/anxious.     Immunization History  Administered Date(s) Administered  . Influenza-Unspecified 06/11/2015  . Pneumococcal Polysaccharide-23 04/21/2011  . Td 04/22/2002  . Zoster 04/27/2012   Pertinent  Health Maintenance Due  Topic Date Due  . PNA vac Low Risk Adult (2 of 2 - PCV13) 04/20/2012  . INFLUENZA VACCINE  Completed   Fall Risk  03/24/2019 02/08/2018 07/28/2016  Falls in the past year? 0 Yes Yes  Number falls in past yr: 0 1 1  Injury with Fall? 0 No No  Risk for fall due to : - - History of fall(s);Impaired balance/gait;Impaired mobility  Follow up - - Falls evaluation completed   Functional Status Survey:    Vitals:   08/29/19 1143  BP: 130/80  Pulse: 78  Resp: 18  Temp: (!) 97.1 F (36.2 C)  SpO2: 92%  Weight: 186 lb 12.8 oz (84.7 kg)  Height: 5\' 7"  (1.702 m)   Body mass index is 29.26 kg/m. Physical Exam Vitals and nursing note reviewed.  Constitutional:      General: He is not in acute distress.    Appearance: Normal appearance. He is not ill-appearing, toxic-appearing or diaphoretic.  HENT:     Head: Normocephalic and atraumatic.      Nose: Nose normal.     Mouth/Throat:     Mouth: Mucous membranes are moist.  Eyes:     Extraocular Movements: Extraocular movements intact.     Conjunctiva/sclera: Conjunctivae normal.     Pupils: Pupils are equal, round, and reactive to light.  Cardiovascular:     Rate and Rhythm: Normal rate and regular rhythm.     Heart sounds: No murmur.  Pulmonary:     Breath sounds: No wheezing, rhonchi or rales.  Abdominal:     General: Bowel sounds are normal. There is no distension.     Palpations: Abdomen is soft.     Tenderness: There is no abdominal tenderness. There is no right CVA tenderness, left CVA tenderness, guarding or rebound.  Musculoskeletal:     Cervical back: Normal range of motion and neck supple.     Right lower leg: Edema present.     Left lower leg: Edema present.     Comments: Trace edema BLE, TED. Left knee brace.   Skin:    General: Skin is warm and dry.  Neurological:     General: No focal deficit present.     Mental Status: He is alert and oriented to person, place, and time. Mental status is at baseline.     Motor: No weakness.     Coordination: Coordination normal.     Gait: Gait abnormal.     Comments: Repetitive, lower body weakness.   Psychiatric:        Mood and Affect: Mood normal.        Behavior: Behavior normal.        Thought Content: Thought content normal.        Judgment: Judgment normal.     Labs reviewed: Recent Labs    03/31/19 0720 08/10/19 0000  NA 143 140  K 3.8 4.1  CL 108 104  CO2 24 28*  GLUCOSE 84  --   BUN 14 17  CREATININE 0.81 0.9  CALCIUM 9.1  --    Recent Labs    03/31/19 0720 08/10/19 0000  AST 14 15  ALT 10 9*  ALKPHOS  --  79  BILITOT 1.0  --   PROT 6.2  --    Recent Labs    03/31/19 0720 08/10/19  0000  WBC 3.8 4.9  NEUTROABS 2,284 2,773  HGB 12.8* 14.2  HCT 38.1* 43  MCV 90.1  --   PLT 187 210   Lab Results  Component Value Date   TSH 6.78 (A) 08/10/2019   No results found for: HGBA1C Lab  Results  Component Value Date   CHOL 238 (A) 08/10/2019   HDL 36 08/10/2019   LDLCALC 175 08/10/2019   TRIG 136 08/10/2019   CHOLHDL 5.1 (H) 03/31/2019    Significant Diagnostic Results in last 30 days:  No results found.  Assessment/Plan History of fall 08/28/19, the resident was found sitting on floor beside dresser on buttock, stated he lost balance when puttin gon his pants. Lack of safety awareness, unsteady gait, increased frailty are contributory. Close supervision needed.   Gait abnormality Walker for a short distance, w/c to go further. Related to  peripheral neuropathy, OA,  Memory loss Mild, 07/28/16 MMSE 24/30, close supervision, reminder needed for safety, continue [facility] for safety, care assistance.   Peripheral neuropathy 03/10/18 neurology Nerve conduction studies done on both lower extremities shows  evidence of a relatively severe primarily axonal peripheral  neuropathy. EMG evaluation of the right lower extremity shows  chronic stable distal signs of denervation consistent with the  diagnosis of peripheral neuropathy. There is no evidence of an  overlying lumbosacral radiculopathy. Continue to ambulate with walker, w/c for mobility, continue Gabapentin 100mg  at bedtime.   Unilateral primary osteoarthritis, right knee Had knee inj in the past, prn Tylenol, left knee brace.      Family/ staff Communication: plan of care reviewed with the patient and charge nurse.   Labs/tests ordered:  none  Time spend 40 minutes.

## 2019-08-30 ENCOUNTER — Encounter: Payer: Self-pay | Admitting: Nurse Practitioner

## 2019-08-30 NOTE — Assessment & Plan Note (Signed)
03/10/18 neurology Nerve conduction studies done on both lower extremities shows  evidence of a relatively severe primarily axonal peripheral  neuropathy. EMG evaluation of the right lower extremity shows  chronic stable distal signs of denervation consistent with the  diagnosis of peripheral neuropathy. There is no evidence of an  overlying lumbosacral radiculopathy. Continue to ambulate with walker, w/c for mobility, continue Gabapentin 100mg  at bedtime.

## 2019-08-30 NOTE — Assessment & Plan Note (Signed)
Walker for a short distance, w/c to go further. Related to  peripheral neuropathy, OA,

## 2019-08-30 NOTE — Assessment & Plan Note (Signed)
Had knee inj in the past, prn Tylenol, left knee brace.

## 2019-08-30 NOTE — Assessment & Plan Note (Signed)
08/28/19, the resident was found sitting on floor beside dresser on buttock, stated he lost balance when puttin gon his pants. Lack of safety awareness, unsteady gait, increased frailty are contributory. Close supervision needed.

## 2019-08-30 NOTE — Assessment & Plan Note (Signed)
Mild, 07/28/16 MMSE 24/30, close supervision, reminder needed for safety, continue [facility] for safety, care assistance.

## 2019-09-21 ENCOUNTER — Non-Acute Institutional Stay: Payer: Medicare PPO | Admitting: Nurse Practitioner

## 2019-09-21 ENCOUNTER — Encounter: Payer: Self-pay | Admitting: Nurse Practitioner

## 2019-09-21 DIAGNOSIS — E039 Hypothyroidism, unspecified: Secondary | ICD-10-CM

## 2019-09-21 DIAGNOSIS — R350 Frequency of micturition: Secondary | ICD-10-CM | POA: Diagnosis not present

## 2019-09-21 DIAGNOSIS — G609 Hereditary and idiopathic neuropathy, unspecified: Secondary | ICD-10-CM | POA: Diagnosis not present

## 2019-09-21 LAB — TSH: TSH: 7.06 — AB (ref 0.41–5.90)

## 2019-09-21 NOTE — Progress Notes (Signed)
Location:   [place] Room Number: [place] Number: [place] of Service:  ALF [phone] ) Provider: [name] NP  Jilberto Vanderwall X, NP  Patient Care Team: Jedd Schulenburg X, NP as PCP - General (Internal Medicine) [name], [name], MD as Consulting Physician (Orthopedic Surgery) [name], MD as Consulting Physician (Gastroenterology) System, Provider Not In [name], MD as Consulting Physician (Neurosurgery) [name], MD as Referring Physician (Ophthalmology) Renda Pohlman X, NP as Nurse Practitioner (Internal Medicine) Almedia Balls, MD as Referring Physician (Orthopedic Surgery) [name], MD as Consulting Physician (Neurosurgery) Elsie Saas, MD as Consulting Physician (Orthopedic Surgery) Allyn Kenner, MD (Dermatology) Kathrynn Ducking, MD as Consulting Physician (Neurology) Franchot Gallo, MD as Consulting Physician (Urology)  Extended Emergency Contact Information Primary Emergency Contact: [name] Address: [address]          [place], [place] [address] [place] Phone: [phone] Relation: Spouse  Code Status: DNR Goals of care: Advanced Directive information Advanced Directives 09/21/2019  Does Patient Have a Medical Advance Directive? Yes  Type of Advance Directive Out of facility DNR (pink MOST or yellow form);[place];Living will  Does patient want to make changes to medical advance directive? No - Patient declined  Copy of [place] in Chart? Yes - validated most recent copy scanned in chart (See row information)  Pre-existing out of facility DNR order (yellow form or pink MOST form) Pink MOST/Yellow Form most recent copy in chart - Physician notified to receive inpatient order     Chief Complaint  Patient presents with  . Acute Visit    Hypothyroidism    HPI:  Pt is a 86 y.o. male seen today for an acute visit for  gradual trended up TSH, 6.78 08/09/19, then 7.06 09/20/19, the patient is clinically asymptomatic, but he desires to proceed wit low dose of Levothyroxine. Hx of peripheral neuropathy, lower body weakness, walks slow with walker, on Gabapentin 148m qd, Tylenol prn, Meloxicam prn. The patient asked if his urinary frequency is caused by hypothyroidism. Hx of urinary frequency, Urology placed on on Tamsulosin 07/30/16, off now, no apparent efficacy.    Past Medical History:  Diagnosis Date  . Abnormal MRI, knee    [facility]   . Anemia   . Anticoagulated   . Anxiety   . Arthritis   . Cervical disc disorder    [facility]   . Complication of anesthesia    states "I was in [place] for several weeks after surgery"caused by tramadol  . Constipation   . Dysuria 07/28/2016  . Edema 07/24/2016  . Gait abnormality 07/25/2016  . Hard of hearing   . Head injury    [facility] a [facility]   . Hip arthritis 07/21/2016  . History of fall 07/25/2016  . Hypercholesteremia   . Hypertension   . Lumbar spinal stenosis   . Memory loss 07/28/2016   mild  . Obesity    [facility]   . Osteoarthritis of right hip 07/24/2016  . Peripheral neuropathy 03/10/2018  . Sleep apnea    cpap  . Spinal stenosis of lumbar region 05/28/2016  . Urinary frequency   . Venous thrombosis of leg 07/24/2016   right gastrocnemius  . Vertigo   . Wears  glasses   . Weight loss 04/08/2016   Past Surgical History:  Procedure Laterality Date  . BACK SURGERY  10/17, 80's  . COLONOSCOPY    . COLONOSCOPY     Per [facility] New Patient Packet, [place]   . ESOPHAGOGASTRODUODENOSCOPY    . LUMBAR LAMINECTOMY/DECOMPRESSION MICRODISCECTOMY Right 05/28/2016   Procedure: Right Lumbar Three-Four Microdiscectomy;  Surgeon: [name], MD;  Location: [place] NEURO ORS;  Service: Neurosurgery;  Laterality: Right;  . TOTAL HIP ARTHROPLASTY Right 07/21/2016   Procedure:  TOTAL HIP ARTHROPLASTY ANTERIOR APPROACH;  Surgeon: [name], MD;  Location: [facility];  Service: Orthopedics;  Laterality: Right;    Allergies  Allergen Reactions  . Tramadol Other (See Comments)    "does not eat" LOSS OF APPETITE    Allergies as of 09/21/2019      Reactions   Tramadol Other (See Comments)   "does not eat" LOSS OF APPETITE      Medication List       Accurate as of September 21, 2019  2:25 PM. If you have any questions, ask your nurse or doctor.        STOP taking these medications   GLUCOSAMINE CHOND TRIPLE/VIT D PO Stopped by:  X , NP     TAKE these medications   acetaminophen 500 MG tablet Commonly known as: TYLENOL Take 1,000 mg by mouth every 8 (eight) hours as needed.   aspirin EC 81 MG tablet Take 81 mg by mouth daily. 8am.   atorvastatin 20 MG tablet Commonly known as: LIPITOR Take 20 mg by mouth daily. 8pm.   B-12 1000 MCG Tabs Take 1 tablet by mouth every other day. 8am.   CoQ10 100 MG Caps Take 1 capsule by mouth daily. 8am.   diphenhydrAMINE 25 MG tablet Commonly known as: BENADRYL Take 25 mg by mouth at bedtime as needed.   fexofenadine 180 MG tablet Commonly known as: ALLEGRA Take 180 mg by mouth daily as needed for allergies or rhinitis.   fluticasone 50 MCG/ACT nasal spray Commonly known as: FLONASE Place 2 sprays into both nostrils as needed.   gabapentin 100 MG capsule Commonly known as: NEURONTIN Take 100 mg by mouth at bedtime. 8pm.   Glucosamine 500 MG Caps Take 500 mg by mouth 3 (three) times daily. Take 3 pills to equal 1572m daily.   meclizine 25 MG tablet Commonly known as: ANTIVERT Take 25 mg by mouth 3 (three) times daily as needed for dizziness.   meloxicam 7.5 MG tablet Commonly known as: MOBIC Take 7.5 mg by mouth as needed for pain. As needed for Osteoarthritis pain. What changed: Another medication with the same name was removed. Continue taking this medication, and follow the  directions you see here. Changed by:  X , NP   NAT-RUL PSYLLIUM SEED HUSKS PO Take 1 tablet by mouth as needed. For constipation.   sodium chloride 0.65 % Soln nasal spray Commonly known as: OCEAN Place 2 sprays into both nostrils as needed for congestion.      ROS was provided with assistance of staff and the patient's wife.  Review of Systems  Constitutional: Negative for activity change, appetite change, chills, diaphoresis, fatigue and fever.  HENT: Positive for hearing loss. Negative for congestion and voice change.   Respiratory: Negative for cough, shortness of breath and wheezing.   Cardiovascular: Positive for leg swelling. Negative for chest pain and palpitations.  Gastrointestinal: Negative for abdominal distention, abdominal pain, constipation, diarrhea, nausea and vomiting.  Genitourinary: Positive  for frequency. Negative for difficulty urinating, dysuria and urgency.  Musculoskeletal: Positive for arthralgias, back pain and gait problem.  Skin: Negative for color change and pallor.  Neurological: Positive for weakness. Negative for dizziness, syncope, speech difficulty and headaches.       Memory lapses.   Psychiatric/Behavioral: Negative for agitation, behavioral problems, hallucinations and sleep disturbance. The patient is not nervous/anxious.     Immunization History  Administered Date(s) Administered  . Influenza-Unspecified 06/11/2015, 06/11/2019  . PFIZER SARS-COV-2 Vaccination 09/10/2019  . Pneumococcal Polysaccharide-23 04/21/2011  . Td 04/22/2002  . Zoster 04/27/2012   Pertinent  Health Maintenance Due  Topic Date Due  . PNA vac Low Risk Adult (2 of 2 - PCV13) 04/20/2012  . INFLUENZA VACCINE  Completed   Fall Risk  03/24/2019 02/08/2018 07/28/2016  Falls in the past year? 0 Yes Yes  Number falls in past yr: 0 1 1  Injury with Fall? 0 No No  Risk for fall due to : - - History of fall(s);Impaired balance/gait;Impaired mobility  Follow up - -  Falls evaluation completed   Functional Status Survey:    Vitals:   09/21/19 1340  BP: 120/72  Pulse: 70  Resp: 20  Temp: (!) 97.2 F (36.2 C)  SpO2: 94%  Weight: 186 lb 12.8 oz (84.7 kg)  Height: 5' 7" (1.702 m)   Body mass index is 29.26 kg/m. Physical Exam Vitals and nursing note reviewed.  Constitutional:      General: He is not in acute distress.    Appearance: Normal appearance. He is not ill-appearing, toxic-appearing or diaphoretic.  HENT:     Head: Normocephalic and atraumatic.     Nose: Nose normal.     Mouth/Throat:     Mouth: Mucous membranes are moist.  Eyes:     Extraocular Movements: Extraocular movements intact.     Conjunctiva/sclera: Conjunctivae normal.     Pupils: Pupils are equal, round, and reactive to light.  Neck:     Vascular: No carotid bruit.  Cardiovascular:     Rate and Rhythm: Normal rate and regular rhythm.     Heart sounds: No murmur. No gallop.   Pulmonary:     Effort: Pulmonary effort is normal.     Breath sounds: No wheezing, rhonchi or rales.  Abdominal:     General: Bowel sounds are normal. There is no distension.     Palpations: Abdomen is soft.     Tenderness: There is no abdominal tenderness. There is no right CVA tenderness, left CVA tenderness, guarding or rebound.  Musculoskeletal:     Cervical back: Normal range of motion and neck supple. No rigidity or tenderness.     Right lower leg: Edema present.     Left lower leg: Edema present.     Comments: 1+ edema BLE  Lymphadenopathy:     Cervical: No cervical adenopathy.  Skin:    General: Skin is warm and dry.  Neurological:     General: No focal deficit present.     Mental Status: He is alert. Mental status is at baseline.     Motor: Weakness present.     Coordination: Coordination normal.     Gait: Gait abnormal.     Comments: Moves slow, walking with walker, lower body weakness, peripheral neuropathy in legs.   Psychiatric:        Mood and Affect: Mood normal.          Behavior: Behavior normal.  Thought Content: Thought content normal.        Judgment: Judgment normal.     Comments: Oriented to person, place.      Labs reviewed: Recent Labs    03/31/19 0720 08/10/19 0000  NA 143 140  K 3.8 4.1  CL 108 104  CO2 24 28*  GLUCOSE 84  --   BUN 14 17  CREATININE 0.81 0.9  CALCIUM 9.1  --    Recent Labs    03/31/19 0720 08/10/19 0000  AST 14 15  ALT 10 9*  ALKPHOS  --  79  BILITOT 1.0  --   PROT 6.2  --    Recent Labs    03/31/19 0720 08/10/19 0000  WBC 3.8 4.9  NEUTROABS 2,284 2,773  HGB 12.8* 14.2  HCT 38.1* 43  MCV 90.1  --   PLT 187 210   Lab Results  Component Value Date   TSH 6.78 (A) 08/10/2019   No results found for: HGBA1C Lab Results  Component Value Date   CHOL 238 (A) 08/10/2019   HDL 36 08/10/2019   LDLCALC 175 08/10/2019   TRIG 136 08/10/2019   CHOLHDL 5.1 (H) 03/31/2019    Significant Diagnostic Results in last 30 days:  No results found.  Assessment/Plan: Hypothyroidism 08/09/19 TSH 6.78, cholesterol 238, HDL 36, triglycerides 136, LDL 175, Na 140, K 4.1, Bun 17, creat 0.87, eGFR 78, wbc 4.9, Hgb 14.2, plt 210, neutrophils 56.6 09/20/19 TSH 7.06, trended up gradually, will start Levothyroxine 54mg qd, f/u TSH 8 weeks.   Peripheral neuropathy Underwent neurology evaluation in the past, 03/2018 nerve conduction study showed severe axonal peripheral neuropathy. Ambulates with walker slow, continue Gabapentin.   Urinary frequency Hx of urinary frequency, Urology placed on on Tamsulosin 07/30/16, off now, no apparent efficacy.    Family/ staff Communication: plan of care reviewed with the patient and charge nurse.   Labs/tests ordered:  TSH in 8 weeks  Time spend 40 minutes.

## 2019-09-21 NOTE — Assessment & Plan Note (Signed)
Hx of urinary frequency, Urology placed on on Tamsulosin 07/30/16, off now, no apparent efficacy.

## 2019-09-21 NOTE — Assessment & Plan Note (Signed)
08/09/19 TSH 6.78, cholesterol 238, HDL 36, triglycerides 136, LDL 175, Na 140, K 4.1, Bun 17, creat 0.87, eGFR 78, wbc 4.9, Hgb 14.2, plt 210, neutrophils 56.6 09/20/19 TSH 7.06, trended up gradually, will start Levothyroxine 46mg qd, f/u TSH 8 weeks.

## 2019-09-21 NOTE — Assessment & Plan Note (Signed)
Underwent neurology evaluation in the past, 03/2018 nerve conduction study showed severe axonal peripheral neuropathy. Ambulates with walker slow, continue Gabapentin.

## 2019-11-02 ENCOUNTER — Non-Acute Institutional Stay: Payer: Medicare PPO | Admitting: Nurse Practitioner

## 2019-11-02 ENCOUNTER — Encounter: Payer: Self-pay | Admitting: Nurse Practitioner

## 2019-11-02 DIAGNOSIS — R269 Unspecified abnormalities of gait and mobility: Secondary | ICD-10-CM

## 2019-11-02 DIAGNOSIS — G609 Hereditary and idiopathic neuropathy, unspecified: Secondary | ICD-10-CM | POA: Diagnosis not present

## 2019-11-02 DIAGNOSIS — M25512 Pain in left shoulder: Secondary | ICD-10-CM | POA: Insufficient documentation

## 2019-11-02 DIAGNOSIS — R609 Edema, unspecified: Secondary | ICD-10-CM

## 2019-11-02 DIAGNOSIS — G8929 Other chronic pain: Secondary | ICD-10-CM

## 2019-11-02 DIAGNOSIS — M8949 Other hypertrophic osteoarthropathy, multiple sites: Secondary | ICD-10-CM

## 2019-11-02 DIAGNOSIS — M159 Polyosteoarthritis, unspecified: Secondary | ICD-10-CM | POA: Insufficient documentation

## 2019-11-02 NOTE — Assessment & Plan Note (Signed)
Left shoulder, R+L knees, s/p R hip replacement, s/p cervical spine surgery, s/p lumbar spine surgery. Continue Tylenol, Mobic, activity as tolerated.

## 2019-11-02 NOTE — Assessment & Plan Note (Signed)
03/10/18 neurology Nerve conduction studies done on both lower extremities shows  evidence of a relatively severe primarily axonal peripheral  neuropathy. EMG evaluation of the right lower extremity shows  chronic stable distal signs of denervation consistent with the  diagnosis of peripheral neuropathy. There is no evidence of an  overlying lumbosacral radiculopathy. Tingling, numbness in legs, weaker, continue Gabapentin.

## 2019-11-02 NOTE — Assessment & Plan Note (Signed)
Chronic trace edema BLE 

## 2019-11-02 NOTE — Assessment & Plan Note (Signed)
Pain with ROM, difficulty using walker or manual w/c to propels self to or from dinning room.

## 2019-11-02 NOTE — Progress Notes (Signed)
Location:   [place] Room Number: [place] of Service:  ALF (13) Provider:  Geovanna Simko X, NP  Jordi Lacko X, NP  Patient Care Team: Jakai Onofre X, NP as PCP - General (Internal Medicine) [name], [name], MD as Consulting Physician (Orthopedic Surgery) [name], MD as Consulting Physician (Gastroenterology) System, Provider Not In [name], MD as Consulting Physician (Neurosurgery) [name], MD as Referring Physician (Ophthalmology) Yaneliz Radebaugh X, NP as Nurse Practitioner (Internal Medicine) Almedia Balls, MD as Referring Physician (Orthopedic Surgery) [name], MD as Consulting Physician (Neurosurgery) Elsie Saas, MD as Consulting Physician (Orthopedic Surgery) Allyn Kenner, MD (Dermatology) Kathrynn Ducking, MD as Consulting Physician (Neurology) Franchot Gallo, MD as Consulting Physician (Urology)  Extended Emergency Contact Information Primary Emergency Contact: [name] Address: [address]          [employer]          [place],  [address] [place] Phone: [phone] Relation: Spouse  Code Status:  Full Code Goals of care: Advanced Directive information Advanced Directives 09/21/2019  Does Patient Have a Medical Advance Directive? Yes  Type of Advance Directive Out of facility DNR (pink MOST or yellow form);[place];Living will  Does patient want to make changes to medical advance directive? No - Patient declined  Copy of [place] in Chart? Yes - validated most recent copy scanned in chart (See row information)  Pre-existing out of facility DNR order (yellow form or pink MOST form) Pink MOST/Yellow Form most recent copy in chart - Physician notified to receive inpatient order     Chief Complaint  Patient presents with  . Medical Management of Chronic Issues  . Health Maintenance    PCV13, TDAP    HPI:  Pt is a 86 y.o. male seen  today for medical management of chronic diseases.    The patient resides in [facility] for safety, care assistance, ambulates with walker, his gait is getting worse due to his chronic peripheral neuropathy in legs, chronic tingling, numbness, weakness present, on Gabapentin 100mg  qhs OA pain, mostly in knees, left shoulder, on prn Tylenol, Mobic.    Past Medical History:  Diagnosis Date  . Abnormal MRI, knee    Per [facility] New Patient Packet   . Anemia   . Anticoagulated   . Anxiety   . Arthritis   . Cervical disc disorder    Per [place] Patient Packet   . Complication of anesthesia    states "I was in [place] for several weeks after surgery"caused by tramadol  . Constipation   . Dysuria 07/28/2016  . Edema 07/24/2016  . Gait abnormality 07/25/2016  . Hard of hearing   . Head injury    As a result of a fall, Per [facility] New Patient Packet   . Hip arthritis 07/21/2016  . History of fall 07/25/2016  . Hypercholesteremia   . Hypertension   . Lumbar spinal stenosis   . Memory loss 07/28/2016   mild  . Obesity    Per [facility] New Patient Packet   . Osteoarthritis of right hip 07/24/2016  . Peripheral neuropathy 03/10/2018  . Sleep apnea    cpap  . Spinal stenosis of lumbar region 05/28/2016  . Urinary frequency   . Venous thrombosis of leg 07/24/2016   right gastrocnemius  . Vertigo   . Wears glasses   . Weight loss 04/08/2016   Past Surgical History:  Procedure Laterality Date  . BACK SURGERY  10/17, 80's  . COLONOSCOPY    . COLONOSCOPY     Per [facility] New Patient Packet, [place]   . ESOPHAGOGASTRODUODENOSCOPY    . LUMBAR LAMINECTOMY/DECOMPRESSION MICRODISCECTOMY Right 05/28/2016   Procedure: Right Lumbar Three-Four Microdiscectomy;  Surgeon: [name], MD;  Location: [facility];  Service: Neurosurgery;  Laterality: Right;  . TOTAL HIP ARTHROPLASTY Right 07/21/2016   Procedure: TOTAL HIP ARTHROPLASTY ANTERIOR APPROACH;  Surgeon: [name], MD;  Location: [place];  Service:  Orthopedics;  Laterality: Right;    Allergies  Allergen Reactions  . Tramadol Other (See Comments)    "does not eat" LOSS OF APPETITE    Allergies as of 11/02/2019      Reactions   Tramadol Other (See Comments)   "does not eat" LOSS OF APPETITE      Medication List       Accurate as of November 02, 2019  3:33 PM. If you have any questions, ask your nurse or doctor.        acetaminophen 500 MG tablet Commonly known as: TYLENOL Take 1,000 mg by mouth every 8 (eight) hours as needed.   aspirin EC 81 MG tablet Take 81 mg by mouth daily. 8am.   atorvastatin 20 MG tablet Commonly known as: LIPITOR Take 20 mg by mouth daily. 8pm.   B-12 1000 MCG Tabs Take 1 tablet by mouth every other day. 8am.   CoQ10 100 MG Caps Take 1 capsule by mouth daily. 8am.   diphenhydrAMINE 25 MG tablet Commonly known as: BENADRYL Take 25 mg by mouth at bedtime as needed.   fexofenadine 180 MG tablet Commonly known as: ALLEGRA Take 180 mg by mouth daily as needed for allergies or rhinitis.   fluticasone 50 MCG/ACT nasal spray Commonly known as: FLONASE Place 2 sprays into both nostrils as needed.   gabapentin 100 MG capsule Commonly known as: NEURONTIN Take 100 mg by mouth at bedtime. 8pm.   Glucosamine 500 MG Caps Take 500 mg by mouth 3 (three) times daily. Take 3 pills to equal 1500mg  daily.   meclizine 25 MG tablet Commonly known as: ANTIVERT Take 25 mg by mouth 3 (three) times daily as needed for dizziness.   meloxicam 7.5 MG tablet Commonly known as: MOBIC Take 7.5 mg by mouth as needed for pain. As needed for Osteoarthritis pain.   NAT-RUL PSYLLIUM SEED HUSKS PO Take 1 tablet by mouth as needed. For constipation.   sodium chloride 0.65 % Soln nasal spray Commonly known as: OCEAN Place 2 sprays into both nostrils as needed for congestion.       Review of Systems  Constitutional: Negative for activity change, appetite change, chills, diaphoresis, fatigue, fever and  unexpected weight change.  HENT: Positive for hearing loss. Negative for congestion and voice change.   Eyes: Negative for visual disturbance.  Respiratory: Negative for cough, shortness of breath and wheezing.   Cardiovascular: Positive for leg swelling. Negative for chest pain and palpitations.  Gastrointestinal: Negative for abdominal distention, abdominal pain, constipation, nausea and vomiting.  Genitourinary: Positive for frequency. Negative for difficulty urinating, dysuria and urgency.  Musculoskeletal: Positive for arthralgias, back pain and gait problem.       R+L knees, left shoulder, s/p cervical spine+lumber spien surgeries.   Skin: Negative for color change and pallor.  Neurological: Positive for weakness. Negative for dizziness, syncope, speech difficulty and headaches.       Memory lapses. Tingling, numbness, weakness in legs.  Smacking lips at rest. Repetitive.   Psychiatric/Behavioral: Negative for agitation, behavioral problems, hallucinations and sleep disturbance. The patient is not nervous/anxious.     Immunization History  Administered Date(s) Administered  . Influenza-Unspecified 06/11/2015, 06/11/2019  . Moderna SARS-COVID-2 Vaccination 09/10/2019, 10/08/2019  . PFIZER SARS-COV-2 Vaccination 09/10/2019  . Pneumococcal Polysaccharide-23 04/21/2011  . Td 04/22/2002  . Zoster 04/27/2012   Pertinent  Health Maintenance Due  Topic Date Due  . PNA vac Low Risk Adult (2 of 2 - PCV13) 04/20/2012  . INFLUENZA VACCINE  Completed   Fall Risk  03/24/2019 02/08/2018 07/28/2016  Falls in the past year? 0 Yes Yes  Number falls in past yr: 0 1 1  Injury with Fall? 0 No No  Risk for fall due to : - - History of fall(s);Impaired balance/gait;Impaired mobility  Follow up - - Falls evaluation completed   Functional Status Survey:    Vitals:   11/02/19 0945  BP: 118/70  Pulse: (!) 58  Resp: 18  Temp: (!) 97.2 F (36.2 C)  SpO2: 96%  Weight: 190 lb 9.6 oz (86.5 kg)    Height: 5\' 4"  (1.626 m)   Body mass index is 32.72 kg/m. Physical Exam Vitals and nursing note reviewed.  Constitutional:      General: He is not in acute distress.    Appearance: Normal appearance. He is not ill-appearing.  HENT:     Head: Normocephalic and atraumatic.     Nose: Nose normal.     Mouth/Throat:     Mouth: Mucous membranes are moist.  Eyes:     Extraocular Movements: Extraocular movements intact.     Conjunctiva/sclera: Conjunctivae normal.     Pupils: Pupils are equal, round, and reactive to light.  Neck:     Vascular: No carotid bruit.  Cardiovascular:     Rate and Rhythm: Normal rate and regular rhythm.     Heart sounds: No murmur. No gallop.   Pulmonary:     Effort: Pulmonary effort is normal.     Breath sounds: No wheezing or rales.  Abdominal:     General: Bowel sounds are normal. There is no distension.     Palpations: Abdomen is soft.     Tenderness: There is no abdominal tenderness. There is no right CVA tenderness, left CVA tenderness, guarding or rebound.  Musculoskeletal:        General: Tenderness present.     Cervical back: Normal range of motion and neck supple. No rigidity or tenderness.     Right lower leg: Edema present.     Left lower leg: Edema present.     Comments: trace edema BLE. Left shoulder pain with ROM is chronic. Chronic R+L knee pain. S/p R hips replacement. S/p cervical/lumbar spine surgeries.   Lymphadenopathy:     Cervical: No cervical adenopathy.  Skin:    General: Skin is warm and dry.  Neurological:     General: No focal deficit present.     Mental Status: He is alert. Mental status is at baseline.     Motor: Weakness present.     Coordination: Coordination normal.     Gait: Gait abnormal.     Comments: Moves slow, walking with walker, lower body weakness, peripheral neuropathy in legs.   Psychiatric:        Mood and Affect: Mood normal.        Behavior: Behavior normal.        Thought Content: Thought content  normal.  Judgment: Judgment normal.     Comments: Oriented to person, place.      Labs reviewed: Recent Labs    03/31/19 0720 08/10/19 0000  NA 143 140  K 3.8 4.1  CL 108 104  CO2 24 28*  GLUCOSE 84  --   BUN 14 17  CREATININE 0.81 0.9  CALCIUM 9.1  --    Recent Labs    03/31/19 0720 08/10/19 0000  AST 14 15  ALT 10 9*  ALKPHOS  --  79  BILITOT 1.0  --   PROT 6.2  --    Recent Labs    03/31/19 0720 08/10/19 0000  WBC 3.8 4.9  NEUTROABS 2,284 2,773  HGB 12.8* 14.2  HCT 38.1* 43  MCV 90.1  --   PLT 187 210   Lab Results  Component Value Date   TSH 7.06 (A) 09/21/2019   No results found for: HGBA1C Lab Results  Component Value Date   CHOL 238 (A) 08/10/2019   HDL 36 08/10/2019   LDLCALC 175 08/10/2019   TRIG 136 08/10/2019   CHOLHDL 5.1 (H) 03/31/2019    Significant Diagnostic Results in last 30 days:  No results found.  Assessment/Plan Gait abnormality OA pain mostly in knees, left shoulder, s/p cervical and lumbar spine surgeries, peripheral neuropathy in legs are contributory. Referral to PT to evaluation and recommendation of power wheelchair.   Peripheral neuropathy 03/10/18 neurology Nerve conduction studies done on both lower extremities shows  evidence of a relatively severe primarily axonal peripheral  neuropathy. EMG evaluation of the right lower extremity shows  chronic stable distal signs of denervation consistent with the  diagnosis of peripheral neuropathy. There is no evidence of an  overlying lumbosacral radiculopathy. Tingling, numbness in legs, weaker, continue Gabapentin.   Left shoulder pain Pain with ROM, difficulty using walker or manual w/c to propels self to or from dinning room.  Osteoarthritis, multiple sites Left shoulder, R+L knees, s/p R hip replacement, s/p cervical spine surgery, s/p lumbar spine surgery. Continue Tylenol, Mobic, activity as tolerated.   Edema Chronic trace edema BLE     Family/ staff  Communication: plan of care reviewed with the patient and charge nurse.   Labs/tests ordered:  none  Time spend 40 minutes.

## 2019-11-02 NOTE — Assessment & Plan Note (Addendum)
OA pain mostly in knees, left shoulder, s/p cervical and lumbar spine surgeries, peripheral neuropathy in legs are contributory. Referral to PT to evaluation and recommendation of power wheelchair.

## 2019-11-08 DIAGNOSIS — I10 Essential (primary) hypertension: Secondary | ICD-10-CM | POA: Diagnosis not present

## 2019-11-08 DIAGNOSIS — R946 Abnormal results of thyroid function studies: Secondary | ICD-10-CM | POA: Diagnosis not present

## 2019-11-08 LAB — LIPID PANEL
Cholesterol: 143 (ref 0–200)
Triglycerides: 81 (ref 40–160)

## 2019-11-08 LAB — TSH: TSH: 7.42 — AB (ref 0.41–5.90)

## 2019-11-09 ENCOUNTER — Non-Acute Institutional Stay: Payer: Medicare PPO | Admitting: Nurse Practitioner

## 2019-11-09 ENCOUNTER — Encounter: Payer: Self-pay | Admitting: Nurse Practitioner

## 2019-11-09 DIAGNOSIS — I10 Essential (primary) hypertension: Secondary | ICD-10-CM

## 2019-11-09 DIAGNOSIS — G609 Hereditary and idiopathic neuropathy, unspecified: Secondary | ICD-10-CM

## 2019-11-09 DIAGNOSIS — E039 Hypothyroidism, unspecified: Secondary | ICD-10-CM | POA: Diagnosis not present

## 2019-11-09 NOTE — Assessment & Plan Note (Signed)
08/09/19 TSH 6.78, cholesterol 238, HDL 36, triglycerides 136, LDL 175, Na 140, K 4.1, Bun 17, creat 0.87, eGFR 78, wbc 4.9, Hgb 14.2, plt 210, neutrophils 56.6 09/20/19 TSH 7.06, trendetd up gradually, will start Levothyroxine 63mg qd, f/u TSH 8 weeks.  11/09/19 persisted elevated TSH 7.42, will increase Levothyroxine 37.56m qd, f/u TSH 12 weeks.

## 2019-11-09 NOTE — Progress Notes (Signed)
Location:    [place] Room Number: [place] of Service:  ALF [id number]) Provider:  Erionna Strum, NP  Geary Rufo X, NP  Patient Care Team: Kjersti Dittmer X, NP as PCP - General (Internal Medicine) [name], [name], MD as Consulting Physician (Orthopedic Surgery) [name], MD as Consulting Physician (Gastroenterology) System, Provider Not In [name], MD as Consulting Physician (Neurosurgery) [name], MD as Referring Physician (Ophthalmology) Sabree Nuon X, NP as Nurse Practitioner (Internal Medicine) Almedia Balls, MD as Referring Physician (Orthopedic Surgery) [name], MD as Consulting Physician (Neurosurgery) Elsie Saas, MD as Consulting Physician (Orthopedic Surgery) Allyn Kenner, MD (Dermatology) Kathrynn Ducking, MD as Consulting Physician (Neurology) Franchot Gallo, MD as Consulting Physician (Urology)  Extended Emergency Contact Information Primary Emergency Contact: [name] Address: [address]          [place], [place] [address] [place] Phone: [phone] Relation: Spouse  Code Status:  FULL CODE Goals of care: Advanced Directive information Advanced Directives 11/09/2019  Does Patient Have a Medical Advance Directive? Yes  Type of Advance Directive Living will;Healthcare Power of Attorney  Does patient want to make changes to medical advance directive? No - Patient declined  Copy of [place] in Chart? Yes - validated most recent copy scanned in chart (See row information)  Pre-existing out of facility DNR order (yellow form or pink MOST form) Pink MOST/Yellow Form most recent copy in chart - Physician notified to receive inpatient order     Chief Complaint  Patient presents with  . Acute Visit    Elevated TSH    HPI:  Pt is a 86 y.o. male seen today for an acute visit for persisted elevated TSH. Hx of hypothyroidism, TSH 6.47 08/09/19, then TSH  7.06 09/20/19, Levothyroxine 75mg qd started subsequently, repeated TSH 7.42 11/09/19. The patient denied fatigue, weight gain, increased sensitivity to cold, constipation, puffy face, change of voice or muscle strength. Peripheral neuropathy, lower body weakness, tingling, and numbness, not new, on Gabapentin 1028mqd.    Past Medical History:  Diagnosis Date  . [facility], [facility]    [facility]   . Anemia   . Anticoagulated   . Anxiety   . Arthritis   . Cervical disc disorder    [facility]   . Complication of anesthesia    states "I was in [place] several weeks after surgery"caused by tramadol  . Constipation   . Dysuria 07/28/2016  . Edema 07/24/2016  . Gait abnormality 07/25/2016  . Hard of hearing   . Head injury    [facility] a [facility]   . Hip arthritis 07/21/2016  . History of fall 07/25/2016  . Hypercholesteremia   . Hypertension   . Lumbar spinal stenosis   . Memory loss 07/28/2016   mild  . Obesity    [facility]   . Osteoarthritis of right hip 07/24/2016  . Peripheral neuropathy 03/10/2018  . Sleep apnea    cpap  . Spinal stenosis of lumbar region 05/28/2016  . Urinary frequency   . Venous thrombosis of leg 07/24/2016   right gastrocnemius  . Vertigo   . Wears glasses   . Weight loss 04/08/2016   Past Surgical History:  Procedure Laterality Date  . BACK SURGERY  10/17, 80's  .  COLONOSCOPY    . COLONOSCOPY     Per [facility] New Patient Packet, [place]   . ESOPHAGOGASTRODUODENOSCOPY    . LUMBAR LAMINECTOMY/DECOMPRESSION MICRODISCECTOMY Right 05/28/2016   Procedure: Right Lumbar Three-Four Microdiscectomy;  Surgeon: [name], MD;  Location: [place] [facility];  Service: Neurosurgery;  Laterality: Right;  . TOTAL HIP ARTHROPLASTY Right 07/21/2016   Procedure: TOTAL HIP ARTHROPLASTY ANTERIOR APPROACH;  Surgeon: [name], MD;  Location: [facility];  Service: Orthopedics;  Laterality:  Right;    Allergies  Allergen Reactions  . Tramadol Other (See Comments)    "does not eat" LOSS OF APPETITE    Allergies as of 11/09/2019      Reactions   Tramadol Other (See Comments)   "does not eat" LOSS OF APPETITE      Medication List       Accurate as of November 09, 2019  2:45 PM. If you have any questions, ask your nurse or doctor.        acetaminophen 500 MG tablet Commonly known as: TYLENOL Take 1,000 mg by mouth every 8 (eight) hours as needed.   aspirin EC 81 MG tablet Take 81 mg by mouth daily. 8am.   atorvastatin 20 MG tablet Commonly known as: LIPITOR Take 20 mg by mouth daily. 8pm.   B-12 1000 MCG Tabs Take 1 tablet by mouth every other day. 8am.   CoQ10 100 MG Caps Take 1 capsule by mouth daily. 8am.   diphenhydrAMINE 25 MG tablet Commonly known as: BENADRYL Take 25 mg by mouth as needed.   fexofenadine 180 MG tablet Commonly known as: ALLEGRA Take 180 mg by mouth daily as needed for allergies or rhinitis.   fluticasone 50 MCG/ACT nasal spray Commonly known as: FLONASE Place 2 sprays into both nostrils as needed.   gabapentin 100 MG capsule Commonly known as: NEURONTIN Take 100 mg by mouth at bedtime. 8pm.   Glucosamine 500 MG Caps Take 500 mg by mouth 3 (three) times daily. Take 3 pills to equal 1597m daily.   levothyroxine 25 MCG tablet Commonly known as: SYNTHROID Take 25 mcg by mouth daily.   meclizine 25 MG tablet Commonly known as: ANTIVERT Take 25 mg by mouth 3 (three) times daily as needed for dizziness.   meloxicam 7.5 MG tablet Commonly known as: MOBIC Take 7.5 mg by mouth as needed for pain. As needed for Osteoarthritis pain.   NAT-RUL PSYLLIUM SEED HUSKS PO Take 1 tablet by mouth as needed. For constipation.   sodium chloride 0.65 % Soln nasal spray Commonly known as: OCEAN Place 2 sprays into both nostrils as needed for congestion.       Review of Systems  Constitutional: Negative for activity change, appetite  change, chills, diaphoresis, fatigue, fever and unexpected weight change.  HENT: Positive for hearing loss. Negative for congestion and voice change.   Eyes: Negative for visual disturbance.  Respiratory: Negative for cough, shortness of breath and wheezing.   Cardiovascular: Positive for leg swelling. Negative for chest pain and palpitations.  Gastrointestinal: Negative for abdominal distention, abdominal pain, constipation, diarrhea, nausea and vomiting.  Genitourinary: Positive for frequency. Negative for difficulty urinating and urgency.  Musculoskeletal: Positive for arthralgias, back pain and gait problem.       R+L knees, left shoulder, s/p cervical spine+lumber spien surgeries.   Skin: Negative for color change.  Neurological: Positive for weakness and numbness. Negative for speech difficulty.       Memory lapses. Tingling, numbness, weakness in legs. Smacking lips  at rest. Repetitive.   Psychiatric/Behavioral: Negative for agitation, behavioral problems, hallucinations and sleep disturbance. The patient is not nervous/anxious.     Immunization History  Administered Date(s) Administered  . Influenza-Unspecified 06/11/2015, 06/11/2019  . Moderna SARS-COVID-2 Vaccination 09/10/2019, 10/08/2019  . PFIZER SARS-COV-2 Vaccination 09/10/2019  . Pneumococcal Polysaccharide-23 04/21/2011  . Td 04/22/2002  . Zoster 04/27/2012   Pertinent  Health Maintenance Due  Topic Date Due  . PNA vac Low Risk Adult (2 of 2 - PCV13) 04/20/2012  . INFLUENZA VACCINE  Completed   Fall Risk  03/24/2019 02/08/2018 07/28/2016  Falls in the past year? 0 Yes Yes  Number falls in past yr: 0 1 1  Injury with Fall? 0 No No  Risk for fall due to : - - History of fall(s);Impaired balance/gait;Impaired mobility  Follow up - - Falls evaluation completed   Functional Status Survey:    Vitals:   11/09/19 1211  BP: 124/86  Pulse: 98  Resp: 18  Temp: (!) 97.2 F (36.2 C)  SpO2: 96%  Weight: 192 lb 3.2 oz  (87.2 kg)  Height: '5\' 4"'  (1.626 m)   Body mass index is 32.99 kg/m. Physical Exam Vitals and nursing note reviewed.  Constitutional:      General: He is not in acute distress.    Appearance: Normal appearance. He is not ill-appearing, toxic-appearing or diaphoretic.  HENT:     Head: Normocephalic and atraumatic.     Mouth/Throat:     Mouth: Mucous membranes are moist.  Eyes:     Extraocular Movements: Extraocular movements intact.     Conjunctiva/sclera: Conjunctivae normal.     Pupils: Pupils are equal, round, and reactive to light.  Neck:     Vascular: No carotid bruit.  Cardiovascular:     Rate and Rhythm: Normal rate and regular rhythm.     Heart sounds: No murmur.  Pulmonary:     Effort: Pulmonary effort is normal.     Breath sounds: No wheezing, rhonchi or rales.  Abdominal:     General: Bowel sounds are normal. There is no distension.     Palpations: Abdomen is soft.     Tenderness: There is no abdominal tenderness. There is no guarding or rebound.  Musculoskeletal:        General: Tenderness present.     Cervical back: Normal range of motion and neck supple. No rigidity or tenderness.     Right lower leg: Edema present.     Left lower leg: Edema present.     Comments: trace edema BLE. Left shoulder pain with ROM is chronic. Chronic R+L knee pain. S/p R hips replacement. S/p cervical/lumbar spine surgeries.   Lymphadenopathy:     Cervical: No cervical adenopathy.  Skin:    General: Skin is warm and dry.  Neurological:     General: No focal deficit present.     Mental Status: He is alert. Mental status is at baseline.     Motor: Weakness present.     Coordination: Coordination normal.     Gait: Gait abnormal.     Comments: Moves slow, walking with walker, lower body weakness, peripheral neuropathy in legs.   Psychiatric:        Mood and Affect: Mood normal.        Behavior: Behavior normal.        Thought Content: Thought content normal.        Judgment:  Judgment normal.     Comments: Oriented to person, place.  Labs reviewed: Recent Labs    03/31/19 0720 08/10/19 0000  NA 143 140  K 3.8 4.1  CL 108 104  CO2 24 28*  GLUCOSE 84  --   BUN 14 17  CREATININE 0.81 0.9  CALCIUM 9.1  --    Recent Labs    03/31/19 0720 08/10/19 0000  AST 14 15  ALT 10 9*  ALKPHOS  --  79  BILITOT 1.0  --   PROT 6.2  --    Recent Labs    03/31/19 0720 08/10/19 0000  WBC 3.8 4.9  NEUTROABS 2,284 2,773  HGB 12.8* 14.2  HCT 38.1* 43  MCV 90.1  --   PLT 187 210   Lab Results  Component Value Date   TSH 7.42 (A) 11/08/2019   No results found for: HGBA1C Lab Results  Component Value Date   CHOL 143 11/08/2019   HDL 36 08/10/2019   LDLCALC 175 08/10/2019   TRIG 81 11/08/2019   CHOLHDL 5.1 (H) 03/31/2019    Significant Diagnostic Results in last 30 days:  No results found.  Assessment/Plan Hypothyroidism 08/09/19 TSH 6.78, cholesterol 238, HDL 36, triglycerides 136, LDL 175, Na 140, K 4.1, Bun 17, creat 0.87, eGFR 78, wbc 4.9, Hgb 14.2, plt 210, neutrophils 56.6 09/20/19 TSH 7.06, trendetd up gradually, will start Levothyroxine 67mg qd, f/u TSH 8 weeks.  11/09/19 persisted elevated TSH 7.42, will increase Levothyroxine 37.567m qd, f/u TSH 12 weeks.    Hypertension Controlled blood pressure.   Peripheral neuropathy Lower body weakness, tingling, numbness remains no change, continue Gabapentin.      Family/ staff Communication: plan of care reviewed with the patient and charge nurse.   Labs/tests ordered:  TSH 12 weeks.   Time spend 40 minutes.

## 2019-11-09 NOTE — Assessment & Plan Note (Signed)
Lower body weakness, tingling, numbness remains no change, continue Gabapentin.

## 2019-11-09 NOTE — Assessment & Plan Note (Signed)
Controlled blood pressure.  

## 2019-12-20 ENCOUNTER — Encounter: Payer: Self-pay | Admitting: Internal Medicine

## 2019-12-20 ENCOUNTER — Non-Acute Institutional Stay: Payer: Medicare PPO | Admitting: Internal Medicine

## 2019-12-20 DIAGNOSIS — M25562 Pain in left knee: Secondary | ICD-10-CM

## 2019-12-20 DIAGNOSIS — E039 Hypothyroidism, unspecified: Secondary | ICD-10-CM | POA: Diagnosis not present

## 2019-12-20 DIAGNOSIS — R350 Frequency of micturition: Secondary | ICD-10-CM

## 2019-12-20 DIAGNOSIS — G8929 Other chronic pain: Secondary | ICD-10-CM | POA: Diagnosis not present

## 2019-12-20 DIAGNOSIS — M8949 Other hypertrophic osteoarthropathy, multiple sites: Secondary | ICD-10-CM | POA: Diagnosis not present

## 2019-12-20 DIAGNOSIS — G609 Hereditary and idiopathic neuropathy, unspecified: Secondary | ICD-10-CM | POA: Diagnosis not present

## 2019-12-20 DIAGNOSIS — M159 Polyosteoarthritis, unspecified: Secondary | ICD-10-CM

## 2019-12-20 DIAGNOSIS — I10 Essential (primary) hypertension: Secondary | ICD-10-CM | POA: Diagnosis not present

## 2019-12-20 NOTE — Progress Notes (Signed)
Location:   [place] Room Number: [place]:  ALF [phone]) Provider:  Veleta Miners MD  Mast, Man X, NP  Patient Care Team: Mast, Man X, NP as PCP - General (Internal Medicine) [name], [name], MD as Consulting Physician (Orthopedic Surgery) [name], MD as Consulting Physician (Gastroenterology) System, Provider Not In [name], MD as Consulting Physician (Neurosurgery) [name], MD as Referring Physician (Ophthalmology) Mast, Man X, NP as Nurse Practitioner (Internal Medicine) Almedia Balls, MD as Referring Physician (Orthopedic Surgery) [name], MD as Consulting Physician (Neurosurgery) Elsie Saas, MD as Consulting Physician (Orthopedic Surgery) Allyn Kenner, MD (Dermatology) Kathrynn Ducking, MD as Consulting Physician (Neurology) Franchot Gallo, MD as Consulting Physician (Urology)  Extended Emergency Contact Information Primary Emergency Contact: [name] Address: [address]          [place]          [place], [place] [address] [place] Phone: [phone] Relation: Spouse  Code Status:  DNR Goals of care: Advanced Directive information Advanced Directives 12/20/2019  Does Patient Have a Medical Advance Directive? Yes  Type of Advance Directive Living will;Healthcare Power of [place];Out of facility DNR (pink MOST or yellow form)  Does patient want to make changes to medical advance directive? No - Patient declined  Copy of [place] in Chart? Yes - validated most recent copy scanned in chart (See row information)  Pre-existing out of facility DNR order (yellow form or pink MOST form) Pink MOST form placed in chart (order not valid for inpatient use)     Chief Complaint  Patient presents with  . Acute Visit    Urinary incontinence    HPI:  Pt is a 86 y.o. male seen today for an acute visit for Urinary Incontinence with Frequency and  Hesitancy  Patient has h/o Peripheral Neuropathy Idiopathic, BPH with urinary Frequency, LE edema,  Arthritis, Sleep Apnea, Hyperlipidemia  Patient has seen Urology for this issue. Was taken off Flomax. Patient says he doe snot want to see them again. Wants to know if he can try Flomax again. Continues to have symptoms due to BPH Going to Bathroom every few Hours. Also Have Hesitance and Incontinence. No Dysuria or Fever No Abdominal Pain Patient also wants to know if he can have Meloxicam QD for his Knee pain  Past Medical History:  Diagnosis Date  . Abnormal MRI, knee    Per [facility] New Patient Packet   . Anemia   . Anticoagulated   . Anxiety   . Arthritis   . Cervical disc disorder    Per [facility] Patient Packet   . Complication of anesthesia    states "I was [place] for several weeks after surgery"caused by tramadol  . Constipation   . Dysuria 07/28/2016  . Edema 07/24/2016  . Gait abnormality 07/25/2016  . Hard of hearing   . Head injury    As a result of a fall, Per [facility] New Patient Packet   . Hip arthritis 07/21/2016  . History of fall 07/25/2016  . Hypercholesteremia   . Hypertension   . Lumbar spinal stenosis   . Memory loss 07/28/2016   mild  . Obesity    Per [facility] New Patient Packet   . Osteoarthritis of right hip 07/24/2016  . Peripheral neuropathy 03/10/2018  . Sleep apnea    cpap  . Spinal stenosis of lumbar region 05/28/2016  . Urinary frequency   .  Venous thrombosis of leg 07/24/2016   right gastrocnemius  . Vertigo   . Wears glasses   . Weight loss 04/08/2016   Past Surgical History:  Procedure Laterality Date  . BACK SURGERY  10/17, 80's  . COLONOSCOPY    . COLONOSCOPY     Per [facility] New Patient Packet, [place]   . ESOPHAGOGASTRODUODENOSCOPY    . LUMBAR LAMINECTOMY/DECOMPRESSION MICRODISCECTOMY Right 05/28/2016   Procedure: Right Lumbar Three-Four Microdiscectomy;  Surgeon: [name], MD;  Location: [place] NEURO ORS;  Service: Neurosurgery;   Laterality: Right;  . TOTAL HIP ARTHROPLASTY Right 07/21/2016   Procedure: TOTAL HIP ARTHROPLASTY ANTERIOR APPROACH;  Surgeon: [name], MD;  Location: [place];  Service: Orthopedics;  Laterality: Right;    Allergies  Allergen Reactions  . Tramadol Other (See Comments)    "does not eat" LOSS OF APPETITE    Allergies as of 12/20/2019      Reactions   Tramadol Other (See Comments)   "does not eat" LOSS OF APPETITE      Medication List       Accurate as of December 20, 2019  3:42 PM. If you have any questions, ask your nurse or doctor.        acetaminophen 500 MG tablet Commonly known as: TYLENOL Take 1,000 mg by mouth every 8 (eight) hours as needed.   aspirin EC 81 MG tablet Take 81 mg by mouth daily. 8am.   atorvastatin 20 MG tablet Commonly known as: LIPITOR Take 20 mg by mouth daily. 8pm.   B-12 1000 MCG Tabs Take 1 tablet by mouth every other day. 8am.   CoQ10 100 MG Caps Take 1 capsule by mouth daily. 8am.   diphenhydrAMINE 25 MG tablet Commonly known as: BENADRYL Take 25 mg by mouth as needed.   fexofenadine 180 MG tablet Commonly known as: ALLEGRA Take 180 mg by mouth daily as needed for allergies or rhinitis.   fluticasone 50 MCG/ACT nasal spray Commonly known as: FLONASE Place 2 sprays into both nostrils as needed.   gabapentin 100 MG capsule Commonly known as: NEURONTIN Take 100 mg by mouth at bedtime. 8pm.   Glucosamine 500 MG Caps Take 500 mg by mouth 3 (three) times daily. Take 3 pills to equal 1500mg  daily.   levothyroxine 75 MCG tablet Commonly known as: SYNTHROID Take 37.5 mcg by mouth daily.   meclizine 25 MG tablet Commonly known as: ANTIVERT Take 25 mg by mouth 3 (three) times daily as needed for dizziness.   meloxicam 7.5 MG tablet Commonly known as: MOBIC Take 7.5 mg by mouth as needed for pain. As needed for Osteoarthritis pain.   NAT-RUL PSYLLIUM SEED HUSKS PO Take 1 tablet by mouth as needed. For constipation.     sodium chloride 0.65 % Soln nasal spray Commonly known as: OCEAN Place 2 sprays into both nostrils as needed for congestion.       Review of Systems  Constitutional: Negative.   HENT: Negative.   Respiratory: Negative.   Cardiovascular: Positive for leg swelling.  Gastrointestinal: Positive for constipation.  Genitourinary: Positive for frequency and urgency.  Musculoskeletal: Positive for arthralgias and gait problem.  Skin: Negative.   Neurological: Positive for weakness.  Psychiatric/Behavioral: Negative.   All other systems reviewed and are negative.   Immunization History  Administered Date(s) Administered  . Influenza-Unspecified 06/11/2015, 06/11/2019  . Moderna SARS-COVID-2 Vaccination 09/10/2019, 10/08/2019  . PFIZER SARS-COV-2 Vaccination 09/10/2019  . Pneumococcal Polysaccharide-23 04/21/2011  . Td 04/22/2002  . Zoster 04/27/2012  Pertinent  Health Maintenance Due  Topic Date Due  . PNA vac Low Risk Adult (2 of 2 - PCV13) 04/20/2012  . INFLUENZA VACCINE  04/08/2020   Fall Risk  03/24/2019 02/08/2018 07/28/2016  Falls in the past year? 0 Yes Yes  Number falls in past yr: 0 1 1  Injury with Fall? 0 No No  Risk for fall due to : - - History of fall(s);Impaired balance/gait;Impaired mobility  Follow up - - Falls evaluation completed   Functional Status Survey:    Vitals:   12/20/19 1538  BP: (!) 126/58  Pulse: 62  Resp: 18  Temp: (!) 97.1 F (36.2 C)  SpO2: 96%  Weight: 194 lb 3.2 oz (88.1 kg)  Height: 5\' 4"  (1.626 m)   Body mass index is 33.33 kg/m. Physical Exam  Constitutional: Oriented to person, place, and time. Well-developed and well-nourished.  HENT:  Head: Normocephalic.  Mouth/Throat: Oropharynx is clear and moist.  Eyes: Pupils are equal, round, and reactive to light.  Neck: Neck supple.  Cardiovascular: Normal rate and normal heart sounds.  No murmur heard. Pulmonary/Chest: Effort normal and breath sounds normal. No respiratory  distress. No wheezes.  has no rales.  Abdominal: Soft. Bowel sounds are normal. No distension. There is no tenderness. There is no rebound.  Musculoskeletal: Mild edema Bilateral Lymphadenopathy: none Neurological: Alert and oriented to person, place, and time.  Skin: Skin is warm and dry.  Psychiatric: Normal mood and affect. Behavior is normal. Thought content normal.    Labs reviewed: Recent Labs    03/31/19 0720 08/10/19 0000  NA 143 140  K 3.8 4.1  CL 108 104  CO2 24 28*  GLUCOSE 84  --   BUN 14 17  CREATININE 0.81 0.9  CALCIUM 9.1  --    Recent Labs    03/31/19 0720 08/10/19 0000  AST 14 15  ALT 10 9*  ALKPHOS  --  79  BILITOT 1.0  --   PROT 6.2  --    Recent Labs    03/31/19 0720 08/10/19 0000  WBC 3.8 4.9  NEUTROABS 2,284 2,773  HGB 12.8* 14.2  HCT 38.1* 43  MCV 90.1  --   PLT 187 210   Lab Results  Component Value Date   TSH 7.42 (A) 11/08/2019   No results found for: HGBA1C Lab Results  Component Value Date   CHOL 143 11/08/2019   HDL 36 08/10/2019   LDLCALC 175 08/10/2019   TRIG 81 11/08/2019   CHOLHDL 5.1 (H) 03/31/2019    Significant Diagnostic Results in last 30 days:  No results found.  Assessment/Plan Urinary frequency No Signs of Infection Will try again Flomax Does not want to Follow with Urology right now Will see him in 2 weeks   Hypothyroidism, unspecified type Was recently started on Synthroid Repeat TSH pending Chronic pain of left knee Meloxicam QD for 3 weeks Then QOD Has seen Ortho before not candidate for Replacement Hyper Lipidemia LDL high 175 Will need to be Statin Will discuss in Next visit Does not want to do 2 Meds in one visit   Idiopathic peripheral neuropathy On Gabapentin Bilateral Edema Does not want Lasix right now due to he is afraid of Frequency  See him in 2 weeks for his Elevated LDL and Urinary symptoms  Family/ staff Communication:   Labs/tests ordered:

## 2020-01-02 ENCOUNTER — Non-Acute Institutional Stay: Payer: Medicare PPO | Admitting: Nurse Practitioner

## 2020-01-02 ENCOUNTER — Encounter: Payer: Self-pay | Admitting: Nurse Practitioner

## 2020-01-02 DIAGNOSIS — R269 Unspecified abnormalities of gait and mobility: Secondary | ICD-10-CM | POA: Diagnosis not present

## 2020-01-02 DIAGNOSIS — W19XXXA Unspecified fall, initial encounter: Secondary | ICD-10-CM | POA: Insufficient documentation

## 2020-01-02 DIAGNOSIS — R350 Frequency of micturition: Secondary | ICD-10-CM

## 2020-01-02 DIAGNOSIS — M8949 Other hypertrophic osteoarthropathy, multiple sites: Secondary | ICD-10-CM

## 2020-01-02 DIAGNOSIS — E039 Hypothyroidism, unspecified: Secondary | ICD-10-CM

## 2020-01-02 DIAGNOSIS — M159 Polyosteoarthritis, unspecified: Secondary | ICD-10-CM

## 2020-01-02 DIAGNOSIS — G609 Hereditary and idiopathic neuropathy, unspecified: Secondary | ICD-10-CM

## 2020-01-02 NOTE — Progress Notes (Addendum)
Location:    [place] Room Number: 903/B Place of Service:  ALF [phone]) Provider: [name] NP  Tahjay Binion X, NP  Patient Care Team: Khandi Kernes X, NP as PCP - General (Internal Medicine) [name], [name], MD as Consulting Physician (Orthopedic Surgery) [name], MD as Consulting Physician (Gastroenterology) System, Provider Not In [name], MD as Consulting Physician (Neurosurgery) [name], MD as Referring Physician (Ophthalmology) Jeremie Abdelaziz X, NP as Nurse Practitioner (Internal Medicine) Almedia Balls, MD as Referring Physician (Orthopedic Surgery) [name], MD as Consulting Physician (Neurosurgery) Elsie Saas, MD as Consulting Physician (Orthopedic Surgery) Allyn Kenner, MD (Dermatology) Kathrynn Ducking, MD as Consulting Physician (Neurology) Franchot Gallo, MD as Consulting Physician (Urology)  Extended Emergency Contact Information Primary Emergency Contact: [name] Address: [address]          [place]          [place], [place] [address] [place] Phone: [phone] Relation: Spouse  Code Status:  Full Code Goals of care: Advanced Directive information Advanced Directives 02/07/2020  Does Patient Have a Medical Advance Directive? Yes  Type of Advance Directive Living will;Healthcare Power of Attorney  Does patient want to make changes to medical advance directive? No - Patient declined  Copy of [place] in Chart? Yes - validated most recent copy scanned in chart (See row information)  Pre-existing out of facility DNR order (yellow form or pink MOST form) -     Chief Complaint  Patient presents with  . Acute Visit    Fall @ [facility]    HPI:  Pt is a 86 y.o. male seen today for an acute visit for reported 12/30/19 the patient was found in sitting position leaning his back on recliner, his knees were locked, leaning against bed, falling backwards flat on  buttocks, L knee pain as usual.   Hx of peripheral neuropathy, weakness in legs, w/c for mobility, on Gabapentin 176m qd. Left knee OA, takign Mobic. Urinary frequency, stable, on Tamsulosin.     Past Medical History:  Diagnosis Date  . [facility]    [facility]   . Anemia   . Anticoagulated   . Anxiety   . Arthritis   . Cervical disc disorder    [facility]   . Complication of anesthesia    states "I was in [place] for several weeks after surgery"caused by tramadol  . Constipation   . Dysuria 07/28/2016  . Edema 07/24/2016  . Gait abnormality 07/25/2016  . Hard of hearing   . Head injury    [facility]   . Hip arthritis 07/21/2016  . History of fall 07/25/2016  . Hypercholesteremia   . Hypertension   . Lumbar spinal stenosis   . Memory loss 07/28/2016   mild  . Obesity    [facility]   . Osteoarthritis of right hip 07/24/2016  . Peripheral neuropathy 03/10/2018  . Sleep apnea    cpap  . Spinal stenosis of lumbar region 05/28/2016  . Urinary frequency   . Venous thrombosis of leg 07/24/2016   right gastrocnemius  . Vertigo   . Wears glasses   . Weight loss 04/08/2016   Past Surgical History:  Procedure Laterality Date  . BACK SURGERY  10/17, 80's  . COLONOSCOPY    . COLONOSCOPY  Per [facility] New Patient Packet, [place]   . ESOPHAGOGASTRODUODENOSCOPY    . LUMBAR LAMINECTOMY/DECOMPRESSION MICRODISCECTOMY Right 05/28/2016   Procedure: Right Lumbar Three-Four Microdiscectomy;  Surgeon: [name], MD;  Location: [place] NEURO ORS;  Service: Neurosurgery;  Laterality: Right;  . TOTAL HIP ARTHROPLASTY Right 07/21/2016   Procedure: TOTAL HIP ARTHROPLASTY ANTERIOR APPROACH;  Surgeon: [name], MD;  Location: [facility];  Service: Orthopedics;  Laterality: Right;    Allergies  Allergen Reactions  . Tramadol Other (See Comments)    "does not eat" LOSS OF APPETITE     Allergies as of 01/02/2020      Reactions   Tramadol Other (See Comments)   "does not eat" LOSS OF APPETITE      Medication List       Accurate as of January 02, 2020 11:59 PM. If you have any questions, ask your nurse or doctor.        acetaminophen 500 MG tablet Commonly known as: TYLENOL Take 1,000 mg by mouth every 8 (eight) hours as needed.   aspirin EC 81 MG tablet Take 81 mg by mouth daily. 8am.   atorvastatin 20 MG tablet Commonly known as: LIPITOR Take 20 mg by mouth daily. 8pm.   B-12 1000 MCG Tabs Take 1 tablet by mouth every other day. 8am.   CoQ10 100 MG Caps Take 1 capsule by mouth daily. 8am.   diphenhydrAMINE 25 MG tablet Commonly known as: BENADRYL Take 25 mg by mouth as needed.   fexofenadine 180 MG tablet Commonly known as: ALLEGRA Take 180 mg by mouth daily as needed for allergies or rhinitis.   fluticasone 50 MCG/ACT nasal spray Commonly known as: FLONASE Place 2 sprays into both nostrils as needed.   gabapentin 100 MG capsule Commonly known as: NEURONTIN Take 100 mg by mouth at bedtime. 8pm.   Glucosamine 500 MG Caps Take by mouth. Take 3 pills to equal 1543m daily.   levothyroxine 75 MCG tablet Commonly known as: SYNTHROID Take 37.5 mcg by mouth daily.   meclizine 25 MG tablet Commonly known as: ANTIVERT Take 25 mg by mouth 3 (three) times daily as needed for dizziness.   meloxicam 7.5 MG tablet Commonly known as: MOBIC Take 7.5 mg by mouth as needed for pain. As needed for Osteoarthritis pain.   meloxicam 7.5 MG tablet Commonly known as: MOBIC Take 7.5 mg by mouth daily.   meloxicam 7.5 MG tablet Commonly known as: MOBIC Take 7.5 mg by mouth daily.   NAT-RUL PSYLLIUM SEED HUSKS PO Take 1 tablet by mouth as needed. For constipation.   sodium chloride 0.65 % Soln nasal spray Commonly known as: OCEAN Place 2 sprays into both nostrils as needed for congestion.   tamsulosin 0.4 MG Caps capsule Commonly known as:  FLOMAX Take 0.4 mg by mouth at bedtime.       Review of Systems  Constitutional: Negative for activity change, appetite change, fatigue and fever.  HENT: Positive for hearing loss. Negative for congestion and voice change.   Eyes: Negative for visual disturbance.  Respiratory: Negative for cough, shortness of breath and wheezing.   Cardiovascular: Positive for leg swelling. Negative for chest pain and palpitations.  Gastrointestinal: Negative for abdominal distention, abdominal pain and constipation.  Genitourinary: Positive for frequency. Negative for difficulty urinating and urgency.  Musculoskeletal: Positive for arthralgias, back pain and gait problem.       R+L knees, left shoulder, s/p cervical spine+lumber spien surgeries. Mostly pain is in the left knee  Skin: Negative for color change.  Neurological: Positive for weakness and numbness. Negative for speech difficulty.       Memory lapses. Tingling, numbness, weakness in legs. Smacking lips at rest. Repetitive.   Psychiatric/Behavioral: Positive for dysphoric mood. Negative for agitation, behavioral problems and sleep disturbance.    Immunization History  Administered Date(s) Administered  . Influenza-Unspecified 06/11/2015, 06/11/2019  . Moderna SARS-COVID-2 Vaccination 09/10/2019, 10/08/2019  . PFIZER SARS-COV-2 Vaccination 09/10/2019  . Pneumococcal Polysaccharide-23 04/21/2011  . Pneumococcal-Unspecified 04/21/2019  . Td 04/22/2002  . Zoster 04/27/2012   Pertinent  Health Maintenance Due  Topic Date Due  . INFLUENZA VACCINE  04/08/2020  . PNA vac Low Risk Adult (2 of 2 - PCV13) 04/20/2020   Fall Risk  03/24/2019 02/08/2018 07/28/2016  Falls in the past year? 0 Yes Yes  Number falls in past yr: 0 1 1  Injury with Fall? 0 No No  Risk for fall due to : - - History of fall(s);Impaired balance/gait;Impaired mobility  Follow up - - Falls evaluation completed   Functional Status Survey:    Vitals:   01/02/20 1649   BP: 120/68  Pulse: 68  Resp: 18  Temp: (!) 97 F (36.1 C)  SpO2: 93%  Weight: 194 lb 3.2 oz (88.1 kg)  Height: '5\' 4"'  (1.626 m)   Body mass index is 33.33 kg/m. Physical Exam Vitals and nursing note reviewed.  Constitutional:      Appearance: Normal appearance.  HENT:     Head: Normocephalic and atraumatic.     Mouth/Throat:     Mouth: Mucous membranes are moist.  Eyes:     Extraocular Movements: Extraocular movements intact.     Conjunctiva/sclera: Conjunctivae normal.     Pupils: Pupils are equal, round, and reactive to light.  Cardiovascular:     Rate and Rhythm: Normal rate and regular rhythm.     Heart sounds: No murmur.  Pulmonary:     Effort: Pulmonary effort is normal.     Breath sounds: No rales.  Abdominal:     General: Bowel sounds are normal. There is no distension.     Palpations: Abdomen is soft.     Tenderness: There is no abdominal tenderness.  Musculoskeletal:        General: Tenderness present. No swelling or deformity.     Cervical back: Normal range of motion and neck supple.     Right lower leg: Edema present.     Left lower leg: Edema present.     Comments: trace edema BLE. Left shoulder pain with ROM is chronic. Chronic R+L knee pain-mostly in the left knee. S/p R hips replacement. S/p cervical/lumbar spine surgeries.   Skin:    General: Skin is warm and dry.  Neurological:     General: No focal deficit present.     Mental Status: He is alert. Mental status is at baseline.     Cranial Nerves: No cranial nerve deficit.     Motor: Weakness present.     Coordination: Coordination normal.     Gait: Gait abnormal.     Comments: Moves slow, uses electric w/c for mobility,  lower body weakness, peripheral neuropathy in legs.   Psychiatric:        Mood and Affect: Mood normal.        Behavior: Behavior normal.        Thought Content: Thought content normal.     Comments: Oriented to person, place.      Labs reviewed: Recent Labs  03/31/19 0720 08/10/19 0000  NA 143 140  K 3.8 4.1  CL 108 104  CO2 24 28*  GLUCOSE 84  --   BUN 14 17  CREATININE 0.81 0.9  CALCIUM 9.1  --    Recent Labs    03/31/19 0720 08/10/19 0000  AST 14 15  ALT 10 9*  ALKPHOS  --  79  BILITOT 1.0  --   PROT 6.2  --    Recent Labs    03/31/19 0720 08/10/19 0000  WBC 3.8 4.9  NEUTROABS 2,284 2,773  HGB 12.8* 14.2  HCT 38.1* 43  MCV 90.1  --   PLT 187 210   Lab Results  Component Value Date   TSH 5.78 01/31/2020   No results found for: HGBA1C Lab Results  Component Value Date   CHOL 143 11/08/2019   HDL 36 08/10/2019   LDLCALC 175 08/10/2019   TRIG 81 11/08/2019   CHOLHDL 5.1 (H) 03/31/2019    Significant Diagnostic Results in last 30 days:  No results found.  Assessment/Plan Fall Reported 12/30/19 the patient was found in sitting position leaning his back on recliner, his knees were locked, leaning against bed, falling backwards flat on buttocks, L knee pain as usual.  Fall precaution was emphasized with the patient due to his lower body weakness.   Osteoarthritis, multiple sites Mostly in the left knee, continue Mobic, symptomatic management.   Urinary frequency Restarted Tamsullosin 12/20/19. Seems better, continue Tamsulosin  Gait abnormality Continue w/c for mobility.   Peripheral neuropathy Weakness in legs, continue Gabapentin.   Hypothyroidism 02/23/20 TSH 7.57, cholesterol 139, HDL 37, triglycerides 100, LDL 82, Na 141, K 4.0, Bun 20, creat 0.91, eGFR 76, wbc 5.3, Hgb 13.8, plt 171, neutrophils 60.8 02/24/20 increase Levothyroxine 37mg qd, TSH 12 weeks.      Family/ staff Communication: plan of care reviewed with the patient and charge nurse.   Labs/tests ordered:  none  Time spend 40 minutes.

## 2020-01-02 NOTE — Assessment & Plan Note (Addendum)
Reported 12/30/19 the patient was found in sitting position leaning his back on recliner, his knees were locked, leaning against bed, falling backwards flat on buttocks, L knee pain as usual.  Fall precaution was emphasized with the patient due to his lower body weakness.

## 2020-01-03 ENCOUNTER — Encounter: Payer: Self-pay | Admitting: Nurse Practitioner

## 2020-01-03 NOTE — Assessment & Plan Note (Signed)
Continue w/c for mobility.  

## 2020-01-03 NOTE — Assessment & Plan Note (Signed)
Mostly in the left knee, continue Mobic, symptomatic management.

## 2020-01-03 NOTE — Assessment & Plan Note (Addendum)
Restarted Tamsullosin 12/20/19. Seems better, continue Tamsulosin

## 2020-01-03 NOTE — Assessment & Plan Note (Signed)
Weakness in legs, continue Gabapentin.

## 2020-01-31 DIAGNOSIS — I10 Essential (primary) hypertension: Secondary | ICD-10-CM | POA: Diagnosis not present

## 2020-01-31 DIAGNOSIS — R946 Abnormal results of thyroid function studies: Secondary | ICD-10-CM | POA: Diagnosis not present

## 2020-01-31 LAB — TSH: TSH: 5.78 (ref 0.41–5.90)

## 2020-02-03 ENCOUNTER — Encounter: Payer: Self-pay | Admitting: Internal Medicine

## 2020-02-03 NOTE — Progress Notes (Signed)
This encounter was created in error - please disregard.

## 2020-02-07 ENCOUNTER — Encounter: Payer: Self-pay | Admitting: Internal Medicine

## 2020-02-07 ENCOUNTER — Non-Acute Institutional Stay: Payer: Medicare PPO | Admitting: Internal Medicine

## 2020-02-07 DIAGNOSIS — E039 Hypothyroidism, unspecified: Secondary | ICD-10-CM

## 2020-02-07 DIAGNOSIS — E785 Hyperlipidemia, unspecified: Secondary | ICD-10-CM | POA: Diagnosis not present

## 2020-02-07 DIAGNOSIS — G609 Hereditary and idiopathic neuropathy, unspecified: Secondary | ICD-10-CM

## 2020-02-07 DIAGNOSIS — M159 Polyosteoarthritis, unspecified: Secondary | ICD-10-CM

## 2020-02-07 DIAGNOSIS — R350 Frequency of micturition: Secondary | ICD-10-CM | POA: Diagnosis not present

## 2020-02-07 DIAGNOSIS — M8949 Other hypertrophic osteoarthropathy, multiple sites: Secondary | ICD-10-CM | POA: Diagnosis not present

## 2020-02-07 DIAGNOSIS — R269 Unspecified abnormalities of gait and mobility: Secondary | ICD-10-CM

## 2020-02-07 NOTE — Progress Notes (Signed)
This encounter was created in error - please disregard.

## 2020-02-08 ENCOUNTER — Encounter: Payer: Self-pay | Admitting: Internal Medicine

## 2020-02-08 NOTE — Progress Notes (Signed)
Location:   [facility] Room Number: [place]:  ALF [phone]) Provider:  Veleta Miners MD   Mast, Man X, NP  Patient Care Team: Mast, Man X, NP as PCP - General (Internal Medicine) [name], [name], MD as Consulting Physician (Orthopedic Surgery) [name], MD as Consulting Physician (Gastroenterology) System, Provider Not In [name], MD as Consulting Physician (Neurosurgery) [name], MD as Referring Physician (Ophthalmology) Mast, Man X, NP as Nurse Practitioner (Internal Medicine) Almedia Balls, MD as Referring Physician (Orthopedic Surgery) [name], MD as Consulting Physician (Neurosurgery) Elsie Saas, MD as Consulting Physician (Orthopedic Surgery) Allyn Kenner, MD (Dermatology) Kathrynn Ducking, MD as Consulting Physician (Neurology) Franchot Gallo, MD as Consulting Physician (Urology)  Extended Emergency Contact Information Primary Emergency Contact: [name] Address: [address]          [place]          [place], [place] [address] [place] Phone: [phone] Relation: Spouse  Code Status:  Full Code Goals of care: Advanced Directive information Advanced Directives 02/07/2020  Does Patient Have a Medical Advance Directive? Yes  Type of Advance Directive Living will;Healthcare Power of Attorney  Does patient want to make changes to medical advance directive? No - Patient declined  Copy of [place] in Chart? Yes - validated most recent copy scanned in chart (See row information)  Pre-existing out of facility DNR order (yellow form or pink MOST form) -     Chief Complaint  Patient presents with  . Medical Management of Chronic Issues    HPI:  Pt is a 86 y.o. male seen today for medical management of chronic diseases.    Patient has h/o Peripheral Neuropathy Idiopathic, BPH with urinary Frequency, LE edema,  Arthritis, Sleep Apnea,  Hyperlipidemia  He is doing well in AL. Stays in the same room with his wife. Uses Electric chair to get around. And Walker inside the room No New Complains today. Has gained weight. No New Nursing issues   Past Medical History:  Diagnosis Date  . Abnormal MRI, knee    Per [facility] New Patient Packet   . Anemia   . Anticoagulated   . Anxiety   . Arthritis   . Cervical disc disorder    Per [facility] Patient Packet   . Complication of anesthesia    states "I was [place] several weeks after surgery"caused by tramadol  . Constipation   . Dysuria 07/28/2016  . Edema 07/24/2016  . Gait abnormality 07/25/2016  . Hard of hearing   . Head injury    As a result of a fall, Per [facility] New Patient Packet   . Hip arthritis 07/21/2016  . History of fall 07/25/2016  . Hypercholesteremia   . Hypertension   . Lumbar spinal stenosis   . Memory loss 07/28/2016   mild  . Obesity    Per [facility] New Patient Packet   . Osteoarthritis of right hip 07/24/2016  . Peripheral neuropathy 03/10/2018  . Sleep apnea    cpap  . Spinal stenosis of lumbar region 05/28/2016  . Urinary frequency   . Venous thrombosis of leg 07/24/2016   right gastrocnemius  . Vertigo   . Wears glasses   . Weight loss 04/08/2016   Past Surgical History:  Procedure Laterality Date  . BACK SURGERY  10/17, 80's  . COLONOSCOPY    . COLONOSCOPY     Per [facility]  New Patient Packet, [place]   . ESOPHAGOGASTRODUODENOSCOPY    . LUMBAR LAMINECTOMY/DECOMPRESSION MICRODISCECTOMY Right 05/28/2016   Procedure: Right Lumbar Three-Four Microdiscectomy;  Surgeon: [name], MD;  Location: [facility] NEURO ORS;  Service: Neurosurgery;  Laterality: Right;  . TOTAL HIP ARTHROPLASTY Right 07/21/2016   Procedure: TOTAL HIP ARTHROPLASTY ANTERIOR APPROACH;  Surgeon: [name], MD;  Location: [facility];  Service: Orthopedics;  Laterality: Right;    Allergies  Allergen Reactions  . Tramadol Other (See Comments)    "does not eat" LOSS OF  APPETITE    Allergies as of 02/07/2020      Reactions   Tramadol Other (See Comments)   "does not eat" LOSS OF APPETITE      Medication List       Accurate as of February 07, 2020 11:59 PM. If you have any questions, ask your nurse or doctor.        acetaminophen 500 MG tablet Commonly known as: TYLENOL Take 1,000 mg by mouth every 8 (eight) hours as needed.   aspirin EC 81 MG tablet Take 81 mg by mouth daily. 8am.   atorvastatin 20 MG tablet Commonly known as: LIPITOR Take 20 mg by mouth daily. 8pm.   B-12 1000 MCG Tabs Take 1 tablet by mouth every other day. 8am.   CoQ10 100 MG Caps Take 1 capsule by mouth daily. 8am.   diphenhydrAMINE 25 MG tablet Commonly known as: BENADRYL Take 25 mg by mouth as needed.   fexofenadine 180 MG tablet Commonly known as: ALLEGRA Take 180 mg by mouth daily as needed for allergies or rhinitis.   fluticasone 50 MCG/ACT nasal spray Commonly known as: FLONASE Place 2 sprays into both nostrils as needed.   gabapentin 100 MG capsule Commonly known as: NEURONTIN Take 100 mg by mouth at bedtime. 8pm.   Glucosamine 500 MG Caps Take by mouth. Take 3 pills to equal 1500mg  daily.   levothyroxine 75 MCG tablet Commonly known as: SYNTHROID Take 37.5 mcg by mouth daily.   meclizine 25 MG tablet Commonly known as: ANTIVERT Take 25 mg by mouth 3 (three) times daily as needed for dizziness.   meloxicam 7.5 MG tablet Commonly known as: MOBIC Take 7.5 mg by mouth as needed for pain. As needed for Osteoarthritis pain.   meloxicam 7.5 MG tablet Commonly known as: MOBIC Take 7.5 mg by mouth daily.   NAT-RUL PSYLLIUM SEED HUSKS PO Take 1 tablet by mouth as needed. For constipation.   sennosides-docusate sodium 8.6-50 MG tablet Commonly known as: SENOKOT-S Take 1 tablet by mouth daily.   sodium chloride 0.65 % Soln nasal spray Commonly known as: OCEAN Place 2 sprays into both nostrils as needed for congestion.   tamsulosin 0.4 MG Caps  capsule Commonly known as: FLOMAX Take 0.4 mg by mouth at bedtime.       Review of Systems  Constitutional: Negative.   HENT: Negative.   Respiratory: Negative.   Cardiovascular: Positive for leg swelling.  Gastrointestinal: Negative.   Genitourinary: Positive for frequency and urgency.  Musculoskeletal: Positive for arthralgias, gait problem and myalgias.  Skin: Negative.   Neurological: Positive for weakness.  Psychiatric/Behavioral: Negative.     Immunization History  Administered Date(s) Administered  . Influenza-Unspecified 06/11/2015, 06/11/2019  . Moderna SARS-COVID-2 Vaccination 09/10/2019, 10/08/2019  . PFIZER SARS-COV-2 Vaccination 09/10/2019  . Pneumococcal Polysaccharide-23 04/21/2011  . Pneumococcal-Unspecified 04/21/2019  . Td 04/22/2002  . Zoster 04/27/2012   Pertinent  Health Maintenance Due  Topic Date Due  .  INFLUENZA VACCINE  04/08/2020  . PNA vac Low Risk Adult (2 of 2 - PCV13) 04/20/2020   Fall Risk  03/24/2019 02/08/2018 07/28/2016  Falls in the past year? 0 Yes Yes  Number falls in past yr: 0 1 1  Injury with Fall? 0 No No  Risk for fall due to : - - History of fall(s);Impaired balance/gait;Impaired mobility  Follow up - - Falls evaluation completed   Functional Status Survey:    Vitals:   02/08/20 1456  BP: 118/60  Pulse: (!) 58  Resp: 18  Temp: (!) 97.4 F (36.3 C)  SpO2: 97%  Weight: 208 lb 12.8 oz (94.7 kg)  Height: 5\' 4"  (1.626 m)   Body mass index is 35.84 kg/m. Physical Exam  Constitutional: Oriented to person, place, and time. Well-developed and well-nourished.  HENT:  Head: Normocephalic.  Mouth/Throat: Oropharynx is clear and moist.  Eyes: Pupils are equal, round, and reactive to light.  Neck: Neck supple.  Cardiovascular: Normal rate and normal heart sounds.  No murmur heard. Pulmonary/Chest: Effort normal and breath sounds normal. No respiratory distress. No wheezes.   Abdominal: Soft. Bowel sounds are normal. No  distension. There is no tenderness. There is no rebound.  Musculoskeletal: Mild Edema Bilateral  Lymphadenopathy: none Neurological: Alert and oriented to person, place, and time.  Can walk with the walker Skin: Skin is warm and dry.  Psychiatric: Normal mood and affect. Behavior is normal. Thought content normal.    Labs reviewed: Recent Labs    03/31/19 0720 08/10/19 0000  NA 143 140  K 3.8 4.1  CL 108 104  CO2 24 28*  GLUCOSE 84  --   BUN 14 17  CREATININE 0.81 0.9  CALCIUM 9.1  --    Recent Labs    03/31/19 0720 08/10/19 0000  AST 14 15  ALT 10 9*  ALKPHOS  --  79  BILITOT 1.0  --   PROT 6.2  --    Recent Labs    03/31/19 0720 08/10/19 0000  WBC 3.8 4.9  NEUTROABS 2,284 2,773  HGB 12.8* 14.2  HCT 38.1* 43  MCV 90.1  --   PLT 187 210   Lab Results  Component Value Date   TSH 5.78 01/31/2020   No results found for: HGBA1C Lab Results  Component Value Date   CHOL 143 11/08/2019   HDL 36 08/10/2019   LDLCALC 175 08/10/2019   TRIG 81 11/08/2019   CHOLHDL 5.1 (H) 03/31/2019    Significant Diagnostic Results in last 30 days:  No results found.  Assessment/Plan  Urinary frequency Back on Flomax Does not want to see Urology right now  Primary osteoarthritis involving multiple joints Meloxicam PRN Gait abnormality Wheelchair Bound now Idiopathic peripheral neuropathy Symptoms Controlled on Neurontin Hypothyroidism, unspecified type TSH Mildily high Continue Same dose of Synthroid Was Changed in 3/21 Hyperlipidemia,  On Lipitor Repeat Lipid Panel   Family/ staff Communication:   Labs/tests ordered:  CBC,CMP,TSH,Lipid Panel

## 2020-02-08 NOTE — Progress Notes (Signed)
This encounter was created in error - please disregard.

## 2020-02-23 DIAGNOSIS — I10 Essential (primary) hypertension: Secondary | ICD-10-CM | POA: Diagnosis not present

## 2020-02-23 DIAGNOSIS — R946 Abnormal results of thyroid function studies: Secondary | ICD-10-CM | POA: Diagnosis not present

## 2020-02-24 ENCOUNTER — Non-Acute Institutional Stay: Payer: Medicare PPO | Admitting: Nurse Practitioner

## 2020-02-24 DIAGNOSIS — E7800 Pure hypercholesterolemia, unspecified: Secondary | ICD-10-CM | POA: Diagnosis not present

## 2020-02-24 DIAGNOSIS — R350 Frequency of micturition: Secondary | ICD-10-CM

## 2020-02-24 DIAGNOSIS — D500 Iron deficiency anemia secondary to blood loss (chronic): Secondary | ICD-10-CM

## 2020-02-24 DIAGNOSIS — M8949 Other hypertrophic osteoarthropathy, multiple sites: Secondary | ICD-10-CM | POA: Diagnosis not present

## 2020-02-24 DIAGNOSIS — E039 Hypothyroidism, unspecified: Secondary | ICD-10-CM | POA: Diagnosis not present

## 2020-02-24 DIAGNOSIS — L219 Seborrheic dermatitis, unspecified: Secondary | ICD-10-CM | POA: Diagnosis not present

## 2020-02-24 DIAGNOSIS — G609 Hereditary and idiopathic neuropathy, unspecified: Secondary | ICD-10-CM

## 2020-02-24 DIAGNOSIS — K5903 Drug induced constipation: Secondary | ICD-10-CM

## 2020-02-24 DIAGNOSIS — M159 Polyosteoarthritis, unspecified: Secondary | ICD-10-CM

## 2020-02-24 LAB — BASIC METABOLIC PANEL
BUN: 20 (ref 4–21)
CO2: 29 — AB (ref 13–22)
Chloride: 105 (ref 99–108)
Creatinine: 0.9 (ref 0.6–1.3)
Glucose: 83
Potassium: 4 (ref 3.4–5.3)
Sodium: 141 (ref 137–147)

## 2020-02-24 LAB — LIPID PANEL
Cholesterol: 139 (ref 0–200)
HDL: 37 (ref 35–70)
LDL Cholesterol: 82
LDl/HDL Ratio: 3.8
Triglycerides: 100 (ref 40–160)

## 2020-02-24 LAB — CBC AND DIFFERENTIAL
HCT: 42 (ref 41–53)
Hemoglobin: 13.8 (ref 13.5–17.5)
Neutrophils Absolute: 3222
Platelets: 171 (ref 150–399)
WBC: 5.3

## 2020-02-24 LAB — COMPREHENSIVE METABOLIC PANEL
Albumin: 4.2 (ref 3.5–5.0)
Calcium: 9 (ref 8.7–10.7)
Globulin: 2.2

## 2020-02-24 LAB — HEPATIC FUNCTION PANEL
ALT: 15 (ref 10–40)
AST: 17 (ref 14–40)
Alkaline Phosphatase: 88 (ref 25–125)
Bilirubin, Total: 0.7

## 2020-02-24 LAB — TSH: TSH: 7.57 — AB (ref 0.41–5.90)

## 2020-02-24 LAB — CBC: RBC: 4.7 (ref 3.87–5.11)

## 2020-02-24 NOTE — Assessment & Plan Note (Signed)
In general, continue Meloxicam, Tylenol.

## 2020-02-24 NOTE — Assessment & Plan Note (Signed)
Stable, continue Vit B12 

## 2020-02-24 NOTE — Assessment & Plan Note (Signed)
LDL at goal, continue Atorvastatin

## 2020-02-24 NOTE — Assessment & Plan Note (Signed)
TSH 7.57 02/23/20, 01/31/20 TSH 5.78, Increase Levothyroxine 50mcg qd ,TSH 12 weeks.

## 2020-02-24 NOTE — Assessment & Plan Note (Signed)
Chronic, no urinary retention, continue Tamsulosin started 07/30/16

## 2020-02-24 NOTE — Assessment & Plan Note (Signed)
Stable, Senokot S qd.

## 2020-02-24 NOTE — Assessment & Plan Note (Signed)
Chronic lower body weakness, uses w/c for mobility, continue Gabapentin, Tylenol.

## 2020-02-24 NOTE — Assessment & Plan Note (Signed)
Facial, will apply 2% Ketoconazole cream bid, 2.5% Hydrocortisone cream bid to affected facial rash x 2 weeks or shorter if healed, then prn.

## 2020-02-24 NOTE — Assessment & Plan Note (Signed)
02/23/20 TSH 7.57, cholesterol 139, HDL 37, triglycerides 100, LDL 82, Na 141, K 4.0, Bun 20, creat 0.91, eGFR 76, wbc 5.3, Hgb 13.8, plt 171, neutrophils 60.8 02/24/20 increase Levothyroxine 55mg qd, TSH 12 weeks.

## 2020-02-24 NOTE — Progress Notes (Signed)
Location:    [place] Room Number: [place] of Service:  ALF (13) Provider: [name] NP  Biagio Snelson X, NP  Patient Care Team: Kiley Solimine X, NP as PCP - General (Internal Medicine) [name], [name], MD as Consulting Physician (Orthopedic Surgery) [name], MD as Consulting Physician (Gastroenterology) System, Provider Not In [name], MD as Consulting Physician (Neurosurgery) [name], MD as Referring Physician (Ophthalmology) Elisheva Fallas X, NP as Nurse Practitioner (Internal Medicine) Almedia Balls, MD as Referring Physician (Orthopedic Surgery) [name], MD as Consulting Physician (Neurosurgery) Elsie Saas, MD as Consulting Physician (Orthopedic Surgery) Allyn Kenner, MD (Dermatology) Kathrynn Ducking, MD as Consulting Physician (Neurology) Franchot Gallo, MD as Consulting Physician (Urology)  Extended Emergency Contact Information Primary Emergency Contact: [name] Address: [address]          [place], [place] [address] [place] Phone: [phone] Relation: Spouse  Code Status: DNR Goals of care: Advanced Directive information Advanced Directives 02/07/2020  Does Patient Have a Medical Advance Directive? Yes  Type of Advance Directive Living will;Healthcare Power of Attorney  Does patient want to make changes to medical advance directive? No - Patient declined  Copy of [place] in Chart? Yes - validated most recent copy scanned in chart (See row information)  Pre-existing out of facility DNR order (yellow form or pink MOST form) -     Chief Complaint  Patient presents with  . Acute Visit    elevated TSH    HPI:  Pt is a 86 y.o. male seen today for an acute visit for TSH 7.57 02/23/20, 01/31/20 TSH 5.78, taking Levothyroxine 37.48mcg qd.   Hx of peripheral neuropathy, weakness in legs, taking Gabapentin 100mg  qd, prn Tylenol.    Hyperlipidemia, LDL 82 02/23/20, taking Atorvastatin 20mg  qd, ASA 81mg  qd  Anemia, Hgb 13.8 02/23/20, on Vit B12 1036mcg qd  OA, on Meloxicam 7.5mg  qd, prn, Tylenol prn.   Constipation, stable, on Senokot S qd   Urinary frequency, chronic taking Tamsulosin 0.4mg  qd.     Past Medical History:  Diagnosis Date  . Abnormal MRI, knee    Per [facility] New Patient Packet   . Anemia   . Anticoagulated   . Anxiety   . Arthritis   . Cervical disc disorder    Per [facility] Patient Packet   . Complication of anesthesia    states "I was in [place] for several weeks after surgery"caused by tramadol  . Constipation   . Dysuria 07/28/2016  . Edema 07/24/2016  . Gait abnormality 07/25/2016  . Hard of hearing   . Head injury    As a result of a fall, Per [facility] New Patient Packet   . Hip arthritis 07/21/2016  . History of fall 07/25/2016  . Hypercholesteremia   . Hypertension   . Lumbar spinal stenosis   . Memory loss 07/28/2016   mild  . Obesity    Per [facility] New Patient Packet   . Osteoarthritis of right hip 07/24/2016  . Peripheral neuropathy 03/10/2018  . Sleep apnea    cpap  . Spinal stenosis of lumbar region 05/28/2016  . Urinary frequency   . Venous thrombosis of leg 07/24/2016   right gastrocnemius  . Vertigo   . Wears glasses   . Weight loss 04/08/2016   Past Surgical History:  Procedure Laterality Date  . BACK SURGERY  10/17, 80's  . COLONOSCOPY    . COLONOSCOPY     Per [facility] New Patient Packet, [place]   . ESOPHAGOGASTRODUODENOSCOPY    . LUMBAR LAMINECTOMY/DECOMPRESSION MICRODISCECTOMY Right 05/28/2016   Procedure: Right Lumbar Three-Four Microdiscectomy;  Surgeon: [name], MD;  [facility];  Service: Neurosurgery;  Laterality: Right;  . TOTAL HIP ARTHROPLASTY Right 07/21/2016   Procedure: TOTAL HIP ARTHROPLASTY ANTERIOR APPROACH;  Surgeon: [name], MD;  Location: [facility];  Service: Orthopedics;  Laterality: Right;    Allergies  Allergen Reactions  .  Tramadol Other (See Comments)    "does not eat" LOSS OF APPETITE    Allergies as of 02/24/2020      Reactions   Tramadol Other (See Comments)   "does not eat" LOSS OF APPETITE      Medication List       Accurate as of February 24, 2020 11:59 PM. If you have any questions, ask your nurse or doctor.        acetaminophen 500 MG tablet Commonly known as: TYLENOL Take 1,000 mg by mouth every 8 (eight) hours as needed.   aspirin EC 81 MG tablet Take 81 mg by mouth daily. 8am.   atorvastatin 20 MG tablet Commonly known as: LIPITOR Take 20 mg by mouth daily. 8pm.   B-12 1000 MCG Tabs Take 1 tablet by mouth every other day. 8am.   CoQ10 100 MG Caps Take 1 capsule by mouth daily. 8am.   diphenhydrAMINE 25 MG tablet Commonly known as: BENADRYL Take 25 mg by mouth as needed.   fexofenadine 180 MG tablet Commonly known as: ALLEGRA Take 180 mg by mouth daily as needed for allergies or rhinitis.   fluticasone 50 MCG/ACT nasal spray Commonly known as: FLONASE Place 2 sprays into both nostrils as needed.   gabapentin 100 MG capsule Commonly known as: NEURONTIN Take 100 mg by mouth at bedtime. 8pm.   Glucosamine 500 MG Caps Take by mouth. Take 3 pills to equal 1500mg  daily.   levothyroxine 75 MCG tablet Commonly known as: SYNTHROID Take 37.5 mcg by mouth daily.   meclizine 25 MG tablet Commonly known as: ANTIVERT Take 25 mg by mouth 3 (three) times daily as needed for dizziness.   meloxicam 7.5 MG tablet Commonly known as: MOBIC Take 7.5 mg by mouth as needed for pain. As needed for Osteoarthritis pain.   meloxicam 7.5 MG tablet Commonly known as: MOBIC Take 7.5 mg by mouth daily.   NAT-RUL PSYLLIUM SEED HUSKS PO Take 1 tablet by mouth as needed. For constipation.   sennosides-docusate sodium 8.6-50 MG tablet Commonly known as: SENOKOT-S Take 1 tablet by mouth daily.   sodium chloride 0.65 % Soln nasal spray Commonly known as: OCEAN Place 2 sprays into both  nostrils as needed for congestion.   tamsulosin 0.4 MG Caps capsule Commonly known as: FLOMAX Take 0.4 mg by mouth at bedtime.       Review of Systems  Constitutional: Negative for appetite change, fatigue and fever.  HENT: Positive for hearing loss. Negative for congestion and voice change.   Eyes: Negative for visual disturbance.  Respiratory: Negative for cough, shortness of breath and wheezing.   Cardiovascular: Positive for leg swelling. Negative for chest pain and palpitations.  Gastrointestinal: Negative for abdominal pain and constipation.  Genitourinary: Positive for frequency. Negative for difficulty urinating and urgency.  Musculoskeletal: Positive for arthralgias, back pain and gait problem.       R+L knees, left shoulder, s/p cervical  spine+lumber spien surgeries. Mostly pain is in the left knee  Skin: Positive for rash. Negative for color change.       Facial rash  Neurological: Positive for weakness and numbness. Negative for speech difficulty.       Memory lapses. Tingling, numbness, weakness in legs. Smacking lips at rest. Repetitive.   Psychiatric/Behavioral: Positive for dysphoric mood. Negative for agitation, behavioral problems and sleep disturbance.    Immunization History  Administered Date(s) Administered  . Influenza-Unspecified 06/11/2015, 06/11/2019  . Moderna SARS-COVID-2 Vaccination 09/10/2019, 10/08/2019  . PFIZER SARS-COV-2 Vaccination 09/10/2019  . Pneumococcal Polysaccharide-23 04/21/2011  . Pneumococcal-Unspecified 04/21/2019  . Td 04/22/2002  . Zoster 04/27/2012   Pertinent  Health Maintenance Due  Topic Date Due  . INFLUENZA VACCINE  04/08/2020  . PNA vac Low Risk Adult (2 of 2 - PCV13) 04/20/2020   Fall Risk  03/24/2019 02/08/2018 07/28/2016  Falls in the past year? 0 Yes Yes  Number falls in past yr: 0 1 1  Injury with Fall? 0 No No  Risk for fall due to : - - History of fall(s);Impaired balance/gait;Impaired mobility  Follow up - -  Falls evaluation completed   Functional Status Survey:    Vitals:   02/24/20 1548  BP: 128/70  Pulse: 64  Resp: 18  Temp: 98.1 F (36.7 C)  SpO2: 95%  Weight: 208 lb 12.8 oz (94.7 kg)  Height: 5\' 4"  (1.626 m)   Body mass index is 35.84 kg/m. Physical Exam Vitals and nursing note reviewed.  Constitutional:      Appearance: Normal appearance.  HENT:     Head: Normocephalic and atraumatic.  Eyes:     Extraocular Movements: Extraocular movements intact.     Conjunctiva/sclera: Conjunctivae normal.     Pupils: Pupils are equal, round, and reactive to light.  Cardiovascular:     Rate and Rhythm: Normal rate and regular rhythm.     Heart sounds: No murmur heard.   Pulmonary:     Effort: Pulmonary effort is normal.     Breath sounds: No rales.  Abdominal:     General: Bowel sounds are normal.     Palpations: Abdomen is soft.     Tenderness: There is no abdominal tenderness.  Musculoskeletal:        General: Tenderness present.     Cervical back: Normal range of motion and neck supple.     Right lower leg: Edema present.     Left lower leg: Edema present.     Comments: trace edema BLE. Left shoulder pain with ROM is chronic. Chronic R+L knee pain-mostly in the left knee. S/p R hips replacement. S/p cervical/lumbar spine surgeries.   Skin:    General: Skin is warm and dry.     Findings: Erythema and rash present.     Comments: Facial redness, rash, itching, mostly in nasolabial folds, chin.   Neurological:     General: No focal deficit present.     Mental Status: He is alert. Mental status is at baseline.     Motor: Weakness present.     Gait: Gait abnormal.     Comments: Moves slow, uses electric w/c for mobility,  lower body weakness, peripheral neuropathy in legs.   Psychiatric:        Mood and Affect: Mood normal.        Behavior: Behavior normal.        Thought Content: Thought content normal.     Comments: Oriented to person, place.  Labs  reviewed: Recent Labs    03/31/19 0720 08/10/19 0000 02/24/20 0000  NA 143 140 141  K 3.8 4.1 4.0  CL 108 104 105  CO2 24 28* 29*  GLUCOSE 84  --   --   BUN 14 17 20   CREATININE 0.81 0.9 0.9  CALCIUM 9.1  --  9.0   Recent Labs    03/31/19 0720 08/10/19 0000 02/24/20 0000  AST 14 15 17   ALT 10 9* 15  ALKPHOS  --  79 88  BILITOT 1.0  --   --   PROT 6.2  --   --   ALBUMIN  --   --  4.2   Recent Labs    03/31/19 0720 08/10/19 0000 02/24/20 0000  WBC 3.8 4.9 5.3  NEUTROABS 2,284 2,773 3,222  HGB 12.8* 14.2 13.8  HCT 38.1* 43 42  MCV 90.1  --   --   PLT 187 210 171   Lab Results  Component Value Date   TSH 7.57 (A) 02/24/2020   No results found for: HGBA1C Lab Results  Component Value Date   CHOL 139 02/24/2020   HDL 37 02/24/2020   LDLCALC 82 02/24/2020   TRIG 100 02/24/2020   CHOLHDL 5.1 (H) 03/31/2019    Significant Diagnostic Results in last 30 days:  No results found.  Assessment/Plan: Seborrheic dermatitis Facial, will apply 2% Ketoconazole cream bid, 2.5% Hydrocortisone cream bid to affected facial rash x 2 weeks or shorter if healed, then prn.   Hypothyroidism TSH 7.57 02/23/20, 01/31/20 TSH 5.78, Increase Levothyroxine 44mcg qd ,TSH 12 weeks.   Peripheral neuropathy Chronic lower body weakness, uses w/c for mobility, continue Gabapentin, Tylenol.   Osteoarthritis, multiple sites In general, continue Meloxicam, Tylenol.   Anemia Stable, continue Vit B12  Constipation Stable, Senokot S qd.   Hypercholesteremia LDL at goal, continue Atorvastatin  Urinary frequency Chronic, no urinary retention, continue Tamsulosin started 07/30/16    Family/ staff Communication: plan of care reviewed with the patient and charge nurse.   Labs/tests ordered:  TSH 12 weeks.   Time spend 40 minutes.

## 2020-02-26 ENCOUNTER — Encounter: Payer: Self-pay | Admitting: Nurse Practitioner

## 2020-03-08 ENCOUNTER — Non-Acute Institutional Stay: Payer: Medicare PPO | Admitting: Internal Medicine

## 2020-03-08 ENCOUNTER — Encounter: Payer: Self-pay | Admitting: Internal Medicine

## 2020-03-08 DIAGNOSIS — G609 Hereditary and idiopathic neuropathy, unspecified: Secondary | ICD-10-CM | POA: Diagnosis not present

## 2020-03-08 DIAGNOSIS — E039 Hypothyroidism, unspecified: Secondary | ICD-10-CM | POA: Diagnosis not present

## 2020-03-08 DIAGNOSIS — R21 Rash and other nonspecific skin eruption: Secondary | ICD-10-CM

## 2020-03-08 DIAGNOSIS — R600 Localized edema: Secondary | ICD-10-CM | POA: Diagnosis not present

## 2020-03-08 DIAGNOSIS — M8949 Other hypertrophic osteoarthropathy, multiple sites: Secondary | ICD-10-CM | POA: Diagnosis not present

## 2020-03-08 DIAGNOSIS — M159 Polyosteoarthritis, unspecified: Secondary | ICD-10-CM

## 2020-03-08 NOTE — Progress Notes (Signed)
Location:  [facility] Room Number: 903-B Place of Service:  ALF [phone]) Provider:  Veleta Miners, MD  Patient Care Team: Mast, Man X, NP as PCP - General (Internal Medicine) [name], [name], MD as Consulting Physician (Orthopedic Surgery) [name], MD as Consulting Physician (Gastroenterology) System, Provider Not In [name], MD as Consulting Physician (Neurosurgery) [name], MD as Referring Physician (Ophthalmology) Mast, Man X, NP as Nurse Practitioner (Internal Medicine) Almedia Balls, MD as Referring Physician (Orthopedic Surgery) [name], MD as Consulting Physician (Neurosurgery) Elsie Saas, MD as Consulting Physician (Orthopedic Surgery) Allyn Kenner, MD (Dermatology) Kathrynn Ducking, MD as Consulting Physician (Neurology) Franchot Gallo, MD as Consulting Physician (Urology)  Extended Emergency Contact Information Primary Emergency Contact: [name] Address: [address]          [place], [place] [address] [place] Phone: [phone] Relation: Spouse  Code Status:  DNR Goals of care: Advanced Directive information Advanced Directives 02/07/2020  Does Patient Have a Medical Advance Directive? Yes  Type of Advance Directive Living will;Healthcare Power of Attorney  Does patient want to make changes to medical advance directive? No - Patient declined  Copy of [place] in Chart? Yes - validated most recent copy scanned in chart (See row information)  Pre-existing out of facility DNR order (yellow form or pink MOST form) -     Chief Complaint  Patient presents with   Acute Visit    Patient is seen for lower extremity edema and rash    HPI:  Pt is an 86 y.o. male seen today for an acute visit for lower extremity edema worsening-and rash in his right groin area  Patient has h/o Peripheral Neuropathy Idiopathic, BPH with urinary Frequency, LE  edema,  Arthritis, Sleep Apnea, Hyperlipidemia  Lower extremity edema Patient does have a history of this has been on Lasix before but stopped it due to decreased urinary frequency.  Has gained almost 10 to 15 pounds in past few months. No shortness of breath. Rash Has developed some rash in his right groin area.  With small open area  Past Medical History:  Diagnosis Date   Abnormal MRI, knee    Per [facility] New Patient Packet    Anemia    Anticoagulated    Anxiety    Arthritis    Cervical disc disorder    Per [facility] New Patient Packet    Complication of anesthesia    states "I was in [place] for several weeks after surgery"caused by tramadol   Constipation    Dysuria 07/28/2016   Edema 07/24/2016   Gait abnormality 07/25/2016   Hard of hearing    Head injury    As a result of a fall, Per [facility] New Patient Packet    Hip arthritis 07/21/2016   History of fall 07/25/2016   Hypercholesteremia    Hypertension    Lumbar spinal stenosis    Memory loss 07/28/2016   mild   Obesity    Per [facility] New Patient Packet    Osteoarthritis of right hip 07/24/2016   Peripheral neuropathy 03/10/2018   Sleep apnea    cpap   Spinal stenosis of lumbar region 05/28/2016   Urinary frequency    Venous thrombosis of leg 07/24/2016   right gastrocnemius   Vertigo    Wears glasses    Weight loss 04/08/2016   Past Surgical History:  Procedure Laterality Date   BACK SURGERY  10/17, 80's   COLONOSCOPY     COLONOSCOPY     Per [facility] New Patient Packet, Dr.Schooler    ESOPHAGOGASTRODUODENOSCOPY     LUMBAR LAMINECTOMY/DECOMPRESSION MICRODISCECTOMY Right 05/28/2016   Procedure: Right Lumbar Three-Four Microdiscectomy;  Surgeon: [name], MD;  Location: [facility];  Service: Neurosurgery;  Laterality: Right;   TOTAL HIP ARTHROPLASTY Right 07/21/2016   Procedure: TOTAL HIP ARTHROPLASTY ANTERIOR APPROACH;  Surgeon: [name], MD;  Location: [facility];  Service:  Orthopedics;  Laterality: Right;    Allergies  Allergen Reactions   Tramadol Other (See Comments)    "does not eat" LOSS OF APPETITE    Outpatient Encounter Medications as of 03/08/2020  Medication Sig   acetaminophen (TYLENOL) 500 MG tablet Take 1,000 mg by mouth every 8 (eight) hours as needed.   aspirin EC 81 MG tablet Take 81 mg by mouth daily. 8am.   atorvastatin (LIPITOR) 20 MG tablet Take 20 mg by mouth daily. 8pm.   Coenzyme Q10 (COQ10) 100 MG CAPS Take 1 capsule by mouth daily. 8am.   Cyanocobalamin (B-12) 1000 MCG TABS Take 1 tablet by mouth every other day. 8am.   diphenhydrAMINE (BENADRYL) 25 MG tablet Take 25 mg by mouth as needed.    fexofenadine (ALLEGRA) 180 MG tablet Take 180 mg by mouth daily as needed for allergies or rhinitis.   fluticasone (FLONASE) 50 MCG/ACT nasal spray Place 2 sprays into both nostrils as needed.    gabapentin (NEURONTIN) 100 MG capsule Take 100 mg by mouth at bedtime. 8pm.   Glucosamine 500 MG CAPS Take by mouth. Take 3 pills to equal 1500mg  daily.    hydrocortisone 2.5 % cream Apply 1 application topically 2 (two) times daily as needed.   [START ON 03/10/2020] ketoconazole (NIZORAL) 2 % cream Apply 1 application topically 2 (two) times daily as needed for irritation.   levothyroxine (SYNTHROID) 50 MCG tablet Take 50 mcg by mouth daily before breakfast.   meclizine (ANTIVERT) 25 MG tablet Take 25 mg by mouth 3 (three) times daily as needed for dizziness.   meloxicam (MOBIC) 7.5 MG tablet Take 7.5 mg by mouth as needed for pain. As needed for Osteoarthritis pain.   meloxicam (MOBIC) 7.5 MG tablet Take 7.5 mg by mouth daily.    NAT-RUL PSYLLIUM SEED HUSKS PO Take 1 tablet by mouth as needed. For constipation.   nystatin (NYSTATIN) powder Apply 1 application topically 2 (two) times daily.   sennosides-docusate sodium (SENOKOT-S) 8.6-50 MG tablet Take 1 tablet by mouth daily.   sodium chloride (OCEAN) 0.65 % SOLN nasal spray Place  2 sprays into both nostrils as needed for congestion.   tamsulosin (FLOMAX) 0.4 MG CAPS capsule Take 0.4 mg by mouth at bedtime.   [DISCONTINUED] levothyroxine (SYNTHROID) 75 MCG tablet Take 37.5 mcg by mouth daily.    No facility-administered encounter medications on file as of 03/08/2020.    Review of Systems  Constitutional: Positive for activity change, appetite change and unexpected weight change.  HENT: Negative.   Respiratory: Negative for shortness of breath.   Cardiovascular: Positive for leg swelling.  Gastrointestinal: Negative.   Genitourinary: Positive for frequency.  Musculoskeletal: Positive for gait problem.  Skin: Positive for rash.  Neurological: Positive for weakness.  Psychiatric/Behavioral: Negative.     Immunization History  Administered Date(s) Administered   Influenza-Unspecified 06/11/2015, 06/11/2019   Moderna SARS-COVID-2 Vaccination 09/10/2019, 10/08/2019   PFIZER SARS-COV-2 Vaccination 09/10/2019   Pneumococcal Polysaccharide-23 04/21/2011  Pneumococcal-Unspecified 04/21/2019   Td 04/22/2002   Zoster 04/27/2012   Pertinent  Health Maintenance Due  Topic Date Due   INFLUENZA VACCINE  04/08/2020   PNA vac Low Risk Adult (2 of 2 - PCV13) 04/20/2020   Fall Risk  03/24/2019 02/08/2018 07/28/2016  Falls in the past year? 0 Yes Yes  Number falls in past yr: 0 1 1  Injury with Fall? 0 No No  Risk for fall due to : - - History of fall(s);Impaired balance/gait;Impaired mobility  Follow up - - Falls evaluation completed   Functional Status Survey:    Vitals:   03/08/20 1543  BP: 120/80  Pulse: 74  Resp: 18  Temp: 97.8 F (36.6 C)  TempSrc: Oral  SpO2: 96%  Weight: 202 lb (91.6 kg)  Height: 5\' 4"  (1.626 m)   Body mass index is 34.67 kg/m. Physical Exam Vitals reviewed.  Constitutional:      Appearance: Normal appearance.  HENT:     Head: Normocephalic.     Nose: Nose normal.     Mouth/Throat:     Mouth: Mucous membranes are  moist.     Pharynx: Oropharynx is clear.  Eyes:     Pupils: Pupils are equal, round, and reactive to light.  Cardiovascular:     Rate and Rhythm: Normal rate and regular rhythm.     Pulses: Normal pulses.  Pulmonary:     Effort: Pulmonary effort is normal.     Breath sounds: Normal breath sounds.  Abdominal:     General: Abdomen is flat. Bowel sounds are normal.     Palpations: Abdomen is soft.  Musculoskeletal:     Comments: Moderate swelling bilateral  Skin:    Comments: Has a fungal rash in right inguinal folds But also has 2 small areas which are open  Neurological:     General: No focal deficit present.     Mental Status: He is alert.  Psychiatric:        Mood and Affect: Mood normal.     Labs reviewed: Recent Labs    03/31/19 0720 08/10/19 0000 02/24/20 0000  NA 143 140 141  K 3.8 4.1 4.0  CL 108 104 105  CO2 24 28* 29*  GLUCOSE 84  --   --   BUN 14 17 20   CREATININE 0.81 0.9 0.9  CALCIUM 9.1  --  9.0   Recent Labs    03/31/19 0720 08/10/19 0000 02/24/20 0000  AST 14 15 17   ALT 10 9* 15  ALKPHOS  --  79 88  BILITOT 1.0  --   --   PROT 6.2  --   --   ALBUMIN  --   --  4.2   Recent Labs    03/31/19 0720 08/10/19 0000 02/24/20 0000  WBC 3.8 4.9 5.3  NEUTROABS 2,284 2,773 3,222  HGB 12.8* 14.2 13.8  HCT 38.1* 43 42  MCV 90.1  --   --   PLT 187 210 171   Lab Results  Component Value Date   TSH 7.57 (A) 02/24/2020   No results found for: HGBA1C Lab Results  Component Value Date   CHOL 139 02/24/2020   HDL 37 02/24/2020   LDLCALC 82 02/24/2020   TRIG 100 02/24/2020   CHOLHDL 5.1 (H) 03/31/2019    Significant Diagnostic Results in last 30 days:  No results found.  Assessment/Plan  Leg edema Starting 20 mg Lasix 3 times a week With potassium Repeat BMP in 2 weeks  Rash Nystatin Hydrocolloid dressing for the small openings Hypothyroidism, unspecified type TSH was high and Synthroid was increased recently Repeat TSH is  pending Idiopathic peripheral neuropathy On Neurontin Primary osteoarthritis involving multiple joints Taking Mobic every day By pharmacy we will start him on Prilosec 20 mg daily for prophylaxis  Other issues Urinary frequency Back on Flomax Does not want to see Urology right now Hyperlipidemia,  On Lipitor LDL 82 Family/ staff Communication:   Labs/tests ordered:  BMP in 2 weeks

## 2020-03-15 ENCOUNTER — Non-Acute Institutional Stay: Payer: Medicare PPO | Admitting: Nurse Practitioner

## 2020-03-15 ENCOUNTER — Encounter: Payer: Self-pay | Admitting: Nurse Practitioner

## 2020-03-15 DIAGNOSIS — L8942 Pressure ulcer of contiguous site of back, buttock and hip, stage 2: Secondary | ICD-10-CM

## 2020-03-15 DIAGNOSIS — E039 Hypothyroidism, unspecified: Secondary | ICD-10-CM

## 2020-03-15 DIAGNOSIS — R609 Edema, unspecified: Secondary | ICD-10-CM

## 2020-03-15 DIAGNOSIS — K5903 Drug induced constipation: Secondary | ICD-10-CM

## 2020-03-15 DIAGNOSIS — G609 Hereditary and idiopathic neuropathy, unspecified: Secondary | ICD-10-CM | POA: Diagnosis not present

## 2020-03-15 DIAGNOSIS — M159 Polyosteoarthritis, unspecified: Secondary | ICD-10-CM

## 2020-03-15 DIAGNOSIS — M8949 Other hypertrophic osteoarthropathy, multiple sites: Secondary | ICD-10-CM

## 2020-03-15 DIAGNOSIS — L89212 Pressure ulcer of right hip, stage 2: Secondary | ICD-10-CM | POA: Insufficient documentation

## 2020-03-15 NOTE — Assessment & Plan Note (Signed)
Improved, continue Furosemide 3x/wk

## 2020-03-15 NOTE — Assessment & Plan Note (Signed)
Lower body weakness, continue Gabapentin.

## 2020-03-15 NOTE — Assessment & Plan Note (Signed)
Right groin, 2 quarter sized with copious amount of serosanguinous drainage seen, moist, heat,  and pressure are contributory, no s/s of infection, will apply Calcium Alginate Ag dressing daily. Observe.

## 2020-03-15 NOTE — Assessment & Plan Note (Signed)
Pending TSH 12 weeks since 02/24/20 Levothyroxine increased.

## 2020-03-15 NOTE — Assessment & Plan Note (Signed)
Pain is better controlled, continue Meloxicam, Omeprazole for GI protection.

## 2020-03-15 NOTE — Assessment & Plan Note (Signed)
Stable, continue Senokot S

## 2020-03-15 NOTE — Progress Notes (Signed)
Location:    [facility] Room Number: [place] of Service:  ALF (13) Provider: [name] NP  Shakia Sebastiano X, NP  Patient Care Team: Haidyn Chadderdon X, NP as PCP - General (Internal Medicine) [name], [name], MD as Consulting Physician (Orthopedic Surgery) [name], MD as Consulting Physician (Gastroenterology) System, Provider Not In [name], MD as Consulting Physician (Neurosurgery) [name], MD as Referring Physician (Ophthalmology) Syndi Pua X, NP as Nurse Practitioner (Internal Medicine) Almedia Balls, MD as Referring Physician (Orthopedic Surgery) [name], MD as Consulting Physician (Neurosurgery) Elsie Saas, MD as Consulting Physician (Orthopedic Surgery) Allyn Kenner, MD (Dermatology) Kathrynn Ducking, MD as Consulting Physician (Neurology) Franchot Gallo, MD as Consulting Physician (Urology)  Extended Emergency Contact Information Primary Emergency Contact: [name] Address: [address]          [place], [place] [address] [place] Phone: [phone] Relation: Spouse  Code Status: Full Code Goals of care: Advanced Directive information Advanced Directives 02/07/2020  Does Patient Have a Medical Advance Directive? Yes  Type of Advance Directive Living will;Healthcare Power of Attorney  Does patient want to make changes to medical advance directive? No - Patient declined  Copy of [place] in Chart? Yes - validated most recent copy scanned in chart (See row information)  Pre-existing out of facility DNR order (yellow form or pink MOST form) -     Chief Complaint  Patient presents with  . Acute Visit    pressure wounds right groin    HPI:  Pt is a 86 y.o. male seen today for an acute visit for evaluation of excessive serosanguinous drainage from the right groin pressure ulcers.    Urinary frequency, takes Flomax  Peripheral neuropathy BLE,  takes Gabapentin  OA, takes Meloxicam, Omeprazole daily for GI protection.   Hypothyroidism, takes Levothyroxine, pending f/u TSH after dose adjustment.   BLE edema, improved, takes Furosemide 3x/wk.   Constipation, takes Senokot S qd   Past Medical History:  Diagnosis Date  . Abnormal MRI, knee    Per [facility] New Patient Packet   . Anemia   . Anticoagulated   . Anxiety   . Arthritis   . Cervical disc disorder    Per [facility] Patient Packet   . Complication of anesthesia    states "I was in [place] for several weeks after surgery"caused by tramadol  . Constipation   . Dysuria 07/28/2016  . Edema 07/24/2016  . Gait abnormality 07/25/2016  . Hard of hearing   . Head injury    As a result of a fall, Per [facility] New Patient Packet   . Hip arthritis 07/21/2016  . History of fall 07/25/2016  . Hypercholesteremia   . Hypertension   . Lumbar spinal stenosis   . Memory loss 07/28/2016   mild  . Obesity    Per [facility] New Patient Packet   . Osteoarthritis of right hip 07/24/2016  . Peripheral neuropathy 03/10/2018  . Sleep apnea    cpap  . Spinal stenosis of lumbar region 05/28/2016  . Urinary frequency   . Venous thrombosis of leg 07/24/2016   right gastrocnemius  . Vertigo   . Wears glasses   . Weight loss 04/08/2016   Past Surgical History:  Procedure Laterality Date  . BACK SURGERY  10/17, 80's  . COLONOSCOPY    . COLONOSCOPY  Per [facility] New Patient Packet, [place]   . ESOPHAGOGASTRODUODENOSCOPY    . LUMBAR LAMINECTOMY/DECOMPRESSION MICRODISCECTOMY Right 05/28/2016   Procedure: Right Lumbar Three-Four Microdiscectomy;  Surgeon: [name], MD;  Location: [facility];  Service: Neurosurgery;  Laterality: Right;  . TOTAL HIP ARTHROPLASTY Right 07/21/2016   Procedure: TOTAL HIP ARTHROPLASTY ANTERIOR APPROACH;  Surgeon: [name], MD;  Location: [name];  Service: Orthopedics;  Laterality: Right;    Allergies  Allergen Reactions  . Tramadol Other (See Comments)     "does not eat" LOSS OF APPETITE    Allergies as of 03/15/2020      Reactions   Tramadol Other (See Comments)   "does not eat" LOSS OF APPETITE      Medication List       Accurate as of March 15, 2020 11:59 PM. If you have any questions, ask your nurse or doctor.        STOP taking these medications   nystatin powder Generic drug: nystatin Stopped by: Dontario Evetts X Caige Almeda, NP     TAKE these medications   acetaminophen 500 MG tablet Commonly known as: TYLENOL Take 1,000 mg by mouth every 8 (eight) hours as needed.   aspirin EC 81 MG tablet Take 81 mg by mouth daily. 8am.   atorvastatin 20 MG tablet Commonly known as: LIPITOR Take 20 mg by mouth daily. 8pm.   B-12 1000 MCG Tabs Take 1 tablet by mouth every other day. 8am.   CoQ10 100 MG Caps Take 1 capsule by mouth daily. 8am.   diphenhydrAMINE 25 MG tablet Commonly known as: BENADRYL Take 25 mg by mouth as needed.   fexofenadine 180 MG tablet Commonly known as: ALLEGRA Take 180 mg by mouth daily as needed for allergies or rhinitis.   fluticasone 50 MCG/ACT nasal spray Commonly known as: FLONASE Place 2 sprays into both nostrils as needed.   furosemide 20 MG tablet Commonly known as: LASIX Take 20 mg by mouth. Mon, Wed, Fri   gabapentin 100 MG capsule Commonly known as: NEURONTIN Take 100 mg by mouth at bedtime. 8pm.   Glucosamine 500 MG Caps Take by mouth. Take 3 pills to equal 1500mg  daily.   hydrocortisone 2.5 % cream Apply topically 2 (two) times daily as needed.   ketoconazole 2 % cream Commonly known as: NIZORAL Apply 1 application topically 2 (two) times daily as needed for irritation.   meclizine 25 MG tablet Commonly known as: ANTIVERT Take 25 mg by mouth 3 (three) times daily as needed for dizziness.   meloxicam 7.5 MG tablet Commonly known as: MOBIC Take 7.5 mg by mouth as needed for pain. As needed for Osteoarthritis pain.   meloxicam 7.5 MG tablet Commonly known as: MOBIC Take 7.5 mg by  mouth daily.   NAT-RUL PSYLLIUM SEED HUSKS PO Take 1 tablet by mouth as needed. For constipation.   omeprazole 20 MG capsule Commonly known as: PRILOSEC Take 20 mg by mouth daily.   sennosides-docusate sodium 8.6-50 MG tablet Commonly known as: SENOKOT-S Take 1 tablet by mouth daily.   sodium chloride 0.65 % Soln nasal spray Commonly known as: OCEAN Place 2 sprays into both nostrils as needed for congestion.   Synthroid 50 MCG tablet Generic drug: levothyroxine Take 50 mcg by mouth daily before breakfast.   tamsulosin 0.4 MG Caps capsule Commonly known as: FLOMAX Take 0.4 mg by mouth at bedtime.       Review of Systems  Constitutional: Negative for activity change, fatigue and fever.  HENT: Positive for hearing loss. Negative for congestion and voice change.   Eyes: Negative for visual disturbance.  Respiratory: Negative for cough and shortness of breath.   Cardiovascular: Positive for leg swelling. Negative for chest pain and palpitations.  Gastrointestinal: Negative for abdominal pain and constipation.  Genitourinary: Positive for frequency. Negative for difficulty urinating and urgency.  Musculoskeletal: Positive for arthralgias, back pain and gait problem.       R+L knees, left shoulder, s/p cervical spine+lumber spien surgeries. Mostly pain is in the left knee  Skin: Positive for wound. Negative for color change.       R groin  Neurological: Positive for weakness and numbness. Negative for speech difficulty.       Memory lapses. Tingling, numbness, weakness in legs.  Psychiatric/Behavioral: Negative for behavioral problems, dysphoric mood and sleep disturbance.    Immunization History  Administered Date(s) Administered  . Influenza-Unspecified 06/11/2015, 06/11/2019  . Moderna SARS-COVID-2 Vaccination 09/10/2019, 10/08/2019  . PFIZER SARS-COV-2 Vaccination 09/10/2019  . Pneumococcal Polysaccharide-23 04/21/2011  . Pneumococcal-Unspecified 04/21/2019  . Td  04/22/2002  . Zoster 04/27/2012   Pertinent  Health Maintenance Due  Topic Date Due  . INFLUENZA VACCINE  04/08/2020  . PNA vac Low Risk Adult (2 of 2 - PCV13) 04/20/2020   Fall Risk  03/24/2019 02/08/2018 07/28/2016  Falls in the past year? 0 Yes Yes  Number falls in past yr: 0 1 1  Injury with Fall? 0 No No  Risk for fall due to : - - History of fall(s);Impaired balance/gait;Impaired mobility  Follow up - - Falls evaluation completed   Functional Status Survey:    Vitals:   03/15/20 1549  BP: 132/70  Pulse: 64  Resp: 18  Temp: 98.2 F (36.8 C)  SpO2: 95%  Weight: 202 lb (91.6 kg)  Height: 5\' 4"  (1.626 m)   Body mass index is 34.67 kg/m. Physical Exam Vitals and nursing note reviewed.  Constitutional:      Appearance: Normal appearance.  HENT:     Head: Normocephalic and atraumatic.  Eyes:     Extraocular Movements: Extraocular movements intact.     Conjunctiva/sclera: Conjunctivae normal.     Pupils: Pupils are equal, round, and reactive to light.  Cardiovascular:     Rate and Rhythm: Normal rate and regular rhythm.     Heart sounds: No murmur heard.   Pulmonary:     Effort: Pulmonary effort is normal.     Breath sounds: No rales.  Abdominal:     General: Bowel sounds are normal.     Palpations: Abdomen is soft.     Tenderness: There is no abdominal tenderness.  Musculoskeletal:     Cervical back: Normal range of motion and neck supple.     Right lower leg: Edema present.     Left lower leg: Edema present.     Comments: trace edema BLE. Left shoulder pain with ROM is chronic. Chronic R+L knee pain-mostly in the left knee, knee support L knee. S/p R hips replacement. S/p cervical/lumbar spine surgeries.   Skin:    General: Skin is warm and dry.     Comments: Right groin open area x2 about a quarter sized each, copious serosanguinous drainage seen, no s/s of infection.   Neurological:     General: No focal deficit present.     Mental Status: He is alert.  Mental status is at baseline.     Motor: Weakness present.     Gait: Gait abnormal.  Comments: Moves slow, uses electric w/c for mobility,  lower body weakness, peripheral neuropathy in legs.   Psychiatric:        Mood and Affect: Mood normal.        Behavior: Behavior normal.        Thought Content: Thought content normal.     Comments: Oriented to person, place.      Labs reviewed: Recent Labs    03/31/19 0720 08/10/19 0000 02/24/20 0000  NA 143 140 141  K 3.8 4.1 4.0  CL 108 104 105  CO2 24 28* 29*  GLUCOSE 84  --   --   BUN 14 17 20   CREATININE 0.81 0.9 0.9  CALCIUM 9.1  --  9.0   Recent Labs    03/31/19 0720 08/10/19 0000 02/24/20 0000  AST 14 15 17   ALT 10 9* 15  ALKPHOS  --  79 88  BILITOT 1.0  --   --   PROT 6.2  --   --   ALBUMIN  --   --  4.2   Recent Labs    03/31/19 0720 08/10/19 0000 02/24/20 0000  WBC 3.8 4.9 5.3  NEUTROABS 2,284 2,773 3,222  HGB 12.8* 14.2 13.8  HCT 38.1* 43 42  MCV 90.1  --   --   PLT 187 210 171   Lab Results  Component Value Date   TSH 7.57 (A) 02/24/2020   No results found for: HGBA1C Lab Results  Component Value Date   CHOL 139 02/24/2020   HDL 37 02/24/2020   LDLCALC 82 02/24/2020   TRIG 100 02/24/2020   CHOLHDL 5.1 (H) 03/31/2019    Significant Diagnostic Results in last 30 days:  No results found.  Assessment/Plan: Pressure ulcer, stage II (HCC) Right groin, 2 quarter sized with copious amount of serosanguinous drainage seen, moist, heat,  and pressure are contributory, no s/s of infection, will apply Calcium Alginate Ag dressing daily. Observe.   Hypothyroidism Pending TSH 12 weeks since 02/24/20 Levothyroxine increased.   Peripheral neuropathy Lower body weakness, continue Gabapentin.   Osteoarthritis, multiple sites Pain is better controlled, continue Meloxicam, Omeprazole for GI protection.   Constipation Stable, continue Senokot S  Edema Improved, continue Furosemide  3x/wk    Family/ staff Communication: plan of care reviewed with the patient and charge nurse.   Labs/tests ordered:  none  Time spend 40 minutes.

## 2020-03-16 ENCOUNTER — Encounter: Payer: Self-pay | Admitting: Nurse Practitioner

## 2020-03-20 ENCOUNTER — Encounter: Payer: Self-pay | Admitting: Internal Medicine

## 2020-03-20 NOTE — Progress Notes (Signed)
Location:   [place] Room Number: [place]:  ALF [phone]) Provider:  Veleta Miners MD   Mast, Man X, NP  Patient Care Team: Mast, Man X, NP as PCP - General (Internal Medicine) [name], [name], MD as Consulting Physician (Orthopedic Surgery) [name], MD as Consulting Physician (Gastroenterology) System, Provider Not In [name], MD as Consulting Physician (Neurosurgery) [name], MD as Referring Physician (Ophthalmology) Mast, Man X, NP as Nurse Practitioner (Internal Medicine) Almedia Balls, MD as Referring Physician (Orthopedic Surgery) [name], MD as Consulting Physician (Neurosurgery) Elsie Saas, MD as Consulting Physician (Orthopedic Surgery) Allyn Kenner, MD (Dermatology) Kathrynn Ducking, MD as Consulting Physician (Neurology) Franchot Gallo, MD as Consulting Physician (Urology)  Extended Emergency Contact Information Primary Emergency Contact: [name] Address: [address]          [place], [place] [address] [place] Phone: [phone] Relation: Spouse  Code Status:  DNR Goals of care: Advanced Directive information Advanced Directives 03/20/2020  Does Patient Have a Medical Advance Directive? Yes  Type of Advance Directive Living will;Healthcare Power of [place];Out of facility DNR (pink MOST or yellow form)  Does patient want to make changes to medical advance directive? No - Patient declined  Copy of [place] in Chart? Yes - validated most recent copy scanned in chart (See row information)  Pre-existing out of facility DNR order (yellow form or pink MOST form) Yellow form placed in chart (order not valid for inpatient use)     Chief Complaint  Patient presents with  . Health Maintenance    TDAP  . Medical Management of Chronic Issues    HPI:  Pt is a 86 y.o. male seen today for medical management of chronic  diseases.     Past Medical History:  Diagnosis Date  . Abnormal MRI, knee    Per [facility] New Patient Packet   . Anemia   . Anticoagulated   . Anxiety   . Arthritis   . Cervical disc disorder    Per [place] Patient Packet   . Complication of anesthesia    states "I was [place] several weeks after surgery"caused by tramadol  . Constipation   . Dysuria 07/28/2016  . Edema 07/24/2016  . Gait abnormality 07/25/2016  . Hard of hearing   . Head injury    As a result of a fall, Per [facility] New Patient Packet   . Hip arthritis 07/21/2016  . History of fall 07/25/2016  . Hypercholesteremia   . Hypertension   . Lumbar spinal stenosis   . Memory loss 07/28/2016   mild  . Obesity    Per [facility] New Patient Packet   . Osteoarthritis of right hip 07/24/2016  . Peripheral neuropathy 03/10/2018  . Sleep apnea    cpap  . Spinal stenosis of lumbar region 05/28/2016  . Urinary frequency   . Venous thrombosis of leg 07/24/2016   right gastrocnemius  . Vertigo   . Wears glasses   . Weight loss 04/08/2016   Past Surgical History:  Procedure Laterality Date  . BACK SURGERY  10/17, 80's  . COLONOSCOPY    . COLONOSCOPY     Per [facility] New Patient Packet, [place]   . ESOPHAGOGASTRODUODENOSCOPY    . LUMBAR LAMINECTOMY/DECOMPRESSION MICRODISCECTOMY Right 05/28/2016   Procedure: Right Lumbar Three-Four Microdiscectomy;  Surgeon: [name], MD;  Location: [place] NEURO ORS;  Service: Neurosurgery;  Laterality: Right;  . TOTAL HIP ARTHROPLASTY Right 07/21/2016   Procedure: TOTAL HIP ARTHROPLASTY ANTERIOR APPROACH;  Surgeon: [name], MD;  Location: [place];  Service: Orthopedics;  Laterality: Right;    Allergies  Allergen Reactions  . Tramadol Other (See Comments)    "does not eat" LOSS OF APPETITE    Allergies as of 03/20/2020      Reactions   Tramadol Other (See Comments)   "does not eat" LOSS OF APPETITE      Medication List       Accurate as of March 20, 2020 10:13 AM. If you  have any questions, ask your nurse or doctor.        acetaminophen 500 MG tablet Commonly known as: TYLENOL Take 1,000 mg by mouth every 8 (eight) hours as needed.   aspirin EC 81 MG tablet Take 81 mg by mouth daily. 8am.   atorvastatin 20 MG tablet Commonly known as: LIPITOR Take 20 mg by mouth daily. 8pm.   B-12 1000 MCG Tabs Take 1 tablet by mouth every other day. 8am.   CoQ10 100 MG Caps Take 1 capsule by mouth daily. 8am.   diphenhydrAMINE 25 MG tablet Commonly known as: BENADRYL Take 25 mg by mouth as needed.   fexofenadine 180 MG tablet Commonly known as: ALLEGRA Take 180 mg by mouth daily as needed for allergies or rhinitis.   fluticasone 50 MCG/ACT nasal spray Commonly known as: FLONASE Place 2 sprays into both nostrils as needed.   furosemide 20 MG tablet Commonly known as: LASIX Take 20 mg by mouth. Mon, Wed, Fri   gabapentin 100 MG capsule Commonly known as: NEURONTIN Take 100 mg by mouth at bedtime. 8pm.   Glucosamine 500 MG Caps Take by mouth. Take 3 pills to equal 1500mg  daily.   hydrocortisone 2.5 % cream Apply topically 2 (two) times daily as needed.   ketoconazole 2 % cream Commonly known as: NIZORAL Apply 1 application topically 2 (two) times daily as needed for irritation.   meclizine 25 MG tablet Commonly known as: ANTIVERT Take 25 mg by mouth 3 (three) times daily as needed for dizziness.   meloxicam 7.5 MG tablet Commonly known as: MOBIC Take 7.5 mg by mouth as needed for pain. As needed for Osteoarthritis pain.   meloxicam 7.5 MG tablet Commonly known as: MOBIC Take 7.5 mg by mouth daily.   NAT-RUL PSYLLIUM SEED HUSKS PO Take 1 tablet by mouth as needed. For constipation.   omeprazole 20 MG capsule Commonly known as: PRILOSEC Take 20 mg by mouth daily.   sennosides-docusate sodium 8.6-50 MG tablet Commonly known as: SENOKOT-S Take 1 tablet by mouth daily.   sodium chloride 0.65 % Soln nasal spray Commonly known as:  OCEAN Place 2 sprays into both nostrils as needed for congestion.   Synthroid 50 MCG tablet Generic drug: levothyroxine Take 50 mcg by mouth daily before breakfast.   tamsulosin 0.4 MG Caps capsule Commonly known as: FLOMAX Take 0.4 mg by mouth at bedtime.       Review of Systems  Immunization History  Administered Date(s) Administered  . Influenza-Unspecified 06/11/2015, 06/11/2019  . Moderna SARS-COVID-2 Vaccination 09/10/2019, 10/08/2019  . PFIZER SARS-COV-2 Vaccination 09/10/2019  . Pneumococcal Polysaccharide-23 04/21/2011  . Pneumococcal-Unspecified 04/21/2019  . Td 04/22/2002  . Zoster 04/27/2012   Pertinent  Health Maintenance Due  Topic Date Due  . INFLUENZA VACCINE  04/08/2020  . PNA vac Low Risk Adult (2  of 2 - PCV13) 04/20/2020   Fall Risk  03/24/2019 02/08/2018 07/28/2016  Falls in the past year? 0 Yes Yes  Number falls in past yr: 0 1 1  Injury with Fall? 0 No No  Risk for fall due to : - - History of fall(s);Impaired balance/gait;Impaired mobility  Follow up - - Falls evaluation completed   Functional Status Survey:    Vitals:   03/20/20 1000  BP: 132/70  Pulse: 64  Resp: 18  Temp: 98 F (36.7 C)  SpO2: 95%  Weight: 202 lb (91.6 kg)  Height: 5\' 4"  (1.626 m)   Body mass index is 34.67 kg/m. Physical Exam  Labs reviewed: Recent Labs    03/31/19 0720 08/10/19 0000 02/24/20 0000  NA 143 140 141  K 3.8 4.1 4.0  CL 108 104 105  CO2 24 28* 29*  GLUCOSE 84  --   --   BUN 14 17 20   CREATININE 0.81 0.9 0.9  CALCIUM 9.1  --  9.0   Recent Labs    03/31/19 0720 08/10/19 0000 02/24/20 0000  AST 14 15 17   ALT 10 9* 15  ALKPHOS  --  79 88  BILITOT 1.0  --   --   PROT 6.2  --   --   ALBUMIN  --   --  4.2   Recent Labs    03/31/19 0720 08/10/19 0000 02/24/20 0000  WBC 3.8 4.9 5.3  NEUTROABS 2,284 2,773 3,222  HGB 12.8* 14.2 13.8  HCT 38.1* 43 42  MCV 90.1  --   --   PLT 187 210 171   Lab Results  Component Value Date   TSH  7.57 (A) 02/24/2020   No results found for: HGBA1C Lab Results  Component Value Date   CHOL 139 02/24/2020   HDL 37 02/24/2020   LDLCALC 82 02/24/2020   TRIG 100 02/24/2020   CHOLHDL 5.1 (H) 03/31/2019    Significant Diagnostic Results in last 30 days:  No results found.  Assessment/Plan There are no diagnoses linked to this encounter.   Family/ staff Communication:   Labs/tests ordered:     This encounter was created in error - please disregard.

## 2020-03-22 DIAGNOSIS — I10 Essential (primary) hypertension: Secondary | ICD-10-CM | POA: Diagnosis not present

## 2020-03-22 LAB — BASIC METABOLIC PANEL
BUN: 21 (ref 4–21)
CO2: 29 — AB (ref 13–22)
Chloride: 104 (ref 99–108)
Creatinine: 1 (ref 0.6–1.3)
Glucose: 90
Potassium: 4.3 (ref 3.4–5.3)
Sodium: 139 (ref 137–147)

## 2020-03-22 LAB — COMPREHENSIVE METABOLIC PANEL: Calcium: 8.6 — AB (ref 8.7–10.7)

## 2020-05-01 ENCOUNTER — Encounter: Payer: Self-pay | Admitting: Internal Medicine

## 2020-05-01 ENCOUNTER — Non-Acute Institutional Stay: Payer: Medicare PPO | Admitting: Internal Medicine

## 2020-05-01 DIAGNOSIS — G609 Hereditary and idiopathic neuropathy, unspecified: Secondary | ICD-10-CM | POA: Diagnosis not present

## 2020-05-01 DIAGNOSIS — R600 Localized edema: Secondary | ICD-10-CM

## 2020-05-01 DIAGNOSIS — M8949 Other hypertrophic osteoarthropathy, multiple sites: Secondary | ICD-10-CM

## 2020-05-01 DIAGNOSIS — E039 Hypothyroidism, unspecified: Secondary | ICD-10-CM | POA: Diagnosis not present

## 2020-05-01 DIAGNOSIS — E785 Hyperlipidemia, unspecified: Secondary | ICD-10-CM | POA: Diagnosis not present

## 2020-05-01 DIAGNOSIS — M159 Polyosteoarthritis, unspecified: Secondary | ICD-10-CM

## 2020-05-01 DIAGNOSIS — R350 Frequency of micturition: Secondary | ICD-10-CM | POA: Diagnosis not present

## 2020-05-01 NOTE — Progress Notes (Signed)
Location:   [place] Room Number: [place]:  ALF [phone]) Provider:  Veleta Miners MD   Mast, Man X, NP  Patient Care Team: Mast, Man X, NP as PCP - General (Internal Medicine) [name], [name], MD as Consulting Physician (Orthopedic Surgery) [name], MD as Consulting Physician (Gastroenterology) System, Provider Not In [name], MD as Consulting Physician (Neurosurgery) [name], MD as Referring Physician (Ophthalmology) Mast, Man X, NP as Nurse Practitioner (Internal Medicine) Almedia Balls, MD as Referring Physician (Orthopedic Surgery) [name], MD as Consulting Physician (Neurosurgery) Elsie Saas, MD as Consulting Physician (Orthopedic Surgery) Allyn Kenner, MD (Dermatology) Kathrynn Ducking, MD as Consulting Physician (Neurology) Franchot Gallo, MD as Consulting Physician (Urology)  Extended Emergency Contact Information Primary Emergency Contact: [name] Address: [address]          [place], [place] [address] [place] Phone: [phone] Relation: Spouse  Code Status:  Full Code Goals of care: Advanced Directive information Advanced Directives 03/20/2020  Does Patient Have a Medical Advance Directive? Yes  Type of Advance Directive Living will;Healthcare Power of [place];Out of facility DNR (pink MOST or yellow form)  Does patient want to make changes to medical advance directive? No - Patient declined  Copy of [place] in Chart? Yes - validated most recent copy scanned in chart (See row information)  Pre-existing out of facility DNR order (yellow form or pink MOST form) Yellow form placed in chart (order not valid for inpatient use)     Chief Complaint  Patient presents with  . Acute Visit    Hip pain    HPI:  Pt is a 86 y.o. male seen today for an acute visit for Right Hip and Knee pain  Patient has h/o Peripheral  Neuropathy Idiopathic, BPH with urinary Frequency, LE edema,  Arthritis, Sleep Apnea, Hyperlipidemia  C/O Pain in his Right Hip and Knee.Also has Pain in his Right shoulder Has h/o Left Knee Replacement Recently was working on [employer] and since then c/o Pain No Fall or other injuries.  Past Medical History:  Diagnosis Date  . Abnormal MRI, knee    Per [facility] New Patient Packet   . Anemia   . Anticoagulated   . Anxiety   . Arthritis   . Cervical disc disorder    Per [place] Patient Packet   . Complication of anesthesia    states "I was [place] several weeks after surgery"caused by tramadol  . Constipation   . Dysuria 07/28/2016  . Edema 07/24/2016  . Gait abnormality 07/25/2016  . Hard of hearing   . Head injury    As a result of a fall, Per [facility] New Patient Packet   . Hip arthritis 07/21/2016  . History of fall 07/25/2016  . Hypercholesteremia   . Hypertension   . Lumbar spinal stenosis   . Memory loss 07/28/2016   mild  . Obesity    Per [facility] New Patient Packet   . Osteoarthritis of right hip 07/24/2016  . Peripheral neuropathy 03/10/2018  . Sleep apnea    cpap  . Spinal stenosis of lumbar region 05/28/2016  . Urinary frequency   . Venous thrombosis of leg 07/24/2016   right gastrocnemius  . Vertigo   . Wears glasses   . Weight loss 04/08/2016   Past Surgical History:  Procedure Laterality Date  .  BACK SURGERY  10/17, 80's  . COLONOSCOPY    . COLONOSCOPY     Per [facility] New Patient Packet, [place]   . ESOPHAGOGASTRODUODENOSCOPY    . LUMBAR LAMINECTOMY/DECOMPRESSION MICRODISCECTOMY Right 05/28/2016   Procedure: Right Lumbar Three-Four Microdiscectomy;  Surgeon: [name], MD;  Location: [place] NEURO ORS;  Service: Neurosurgery;  Laterality: Right;  . TOTAL HIP ARTHROPLASTY Right 07/21/2016   Procedure: TOTAL HIP ARTHROPLASTY ANTERIOR APPROACH;  Surgeon: [name], MD;  Location: [place];  Service: Orthopedics;  Laterality: Right;    Allergies  Allergen  Reactions  . Tramadol Other (See Comments)    "does not eat" LOSS OF APPETITE    Allergies as of 05/01/2020      Reactions   Tramadol Other (See Comments)   "does not eat" LOSS OF APPETITE      Medication List       Accurate as of May 01, 2020 10:32 AM. If you have any questions, ask your nurse or doctor.        acetaminophen 500 MG tablet Commonly known as: TYLENOL Take 1,000 mg by mouth every 8 (eight) hours as needed.   aspirin EC 81 MG tablet Take 81 mg by mouth daily. 8am.   atorvastatin 20 MG tablet Commonly known as: LIPITOR Take 20 mg by mouth daily. 8pm.   B-12 1000 MCG Tabs Take 1 tablet by mouth every other day. 8am.   CoQ10 100 MG Caps Take 1 capsule by mouth daily. 8am.   diphenhydrAMINE 25 MG tablet Commonly known as: BENADRYL Take 25 mg by mouth as needed.   fexofenadine 180 MG tablet Commonly known as: ALLEGRA Take 180 mg by mouth daily as needed for allergies or rhinitis.   fluticasone 50 MCG/ACT nasal spray Commonly known as: FLONASE Place 2 sprays into both nostrils as needed.   furosemide 20 MG tablet Commonly known as: LASIX Take 20 mg by mouth. Mon, Wed, Fri   gabapentin 100 MG capsule Commonly known as: NEURONTIN Take 100 mg by mouth at bedtime. 8pm.   Glucosamine 500 MG Caps Take by mouth. Take 3 pills to equal 1500mg  daily.   hydrocortisone 2.5 % cream Apply topically 2 (two) times daily as needed.   ketoconazole 2 % cream Commonly known as: NIZORAL Apply 1 application topically 2 (two) times daily as needed for irritation.   meclizine 25 MG tablet Commonly known as: ANTIVERT Take 25 mg by mouth 3 (three) times daily as needed for dizziness.   meloxicam 7.5 MG tablet Commonly known as: MOBIC Take 7.5 mg by mouth as needed for pain. As needed for Osteoarthritis pain.   meloxicam 7.5 MG tablet Commonly known as: MOBIC Take 7.5 mg by mouth daily.   NAT-RUL PSYLLIUM SEED HUSKS PO Take 1 tablet by mouth as needed.  For constipation.   omeprazole 20 MG capsule Commonly known as: PRILOSEC Take 20 mg by mouth daily.   sennosides-docusate sodium 8.6-50 MG tablet Commonly known as: SENOKOT-S Take 1 tablet by mouth daily.   sodium chloride 0.65 % Soln nasal spray Commonly known as: OCEAN Place 2 sprays into both nostrils as needed for congestion.   Synthroid 50 MCG tablet Generic drug: levothyroxine Take 50 mcg by mouth daily before breakfast.   tamsulosin 0.4 MG Caps capsule Commonly known as: FLOMAX Take 0.4 mg by mouth at bedtime.       Review of Systems  Constitutional: Positive for activity change.  HENT: Negative.   Respiratory: Negative.   Cardiovascular: Positive for  leg swelling.  Gastrointestinal: Negative.   Genitourinary: Positive for frequency.  Musculoskeletal: Positive for arthralgias, gait problem and myalgias.  Skin: Negative.   Neurological: Negative for dizziness.  Psychiatric/Behavioral: Negative.     Immunization History  Administered Date(s) Administered  . Influenza-Unspecified 06/11/2015, 06/11/2019  . Moderna SARS-COVID-2 Vaccination 09/10/2019, 10/08/2019  . PFIZER SARS-COV-2 Vaccination 09/10/2019  . Pneumococcal Polysaccharide-23 04/21/2011  . Pneumococcal-Unspecified 04/21/2019  . Td 04/22/2002  . Zoster 04/27/2012   Pertinent  Health Maintenance Due  Topic Date Due  . INFLUENZA VACCINE  04/08/2020  . PNA vac Low Risk Adult (2 of 2 - PCV13) 04/20/2020   Fall Risk  03/24/2019 02/08/2018 07/28/2016  Falls in the past year? 0 Yes Yes  Number falls in past yr: 0 1 1  Injury with Fall? 0 No No  Risk for fall due to : - - History of fall(s);Impaired balance/gait;Impaired mobility  Follow up - - Falls evaluation completed   Functional Status Survey:    Vitals:   05/01/20 1026  BP: 128/74  Pulse: 72  Resp: 20  Temp: 99.1 F (37.3 C)  SpO2: 95%  Weight: 202 lb (91.6 kg)  Height: 5\' 4"  (1.626 m)   Body mass index is 34.67 kg/m. Physical  Exam Vitals reviewed.  Constitutional:      Appearance: Normal appearance.  HENT:     Head: Normocephalic.     Nose: Nose normal.     Mouth/Throat:     Mouth: Mucous membranes are moist.     Pharynx: Oropharynx is clear.  Eyes:     Pupils: Pupils are equal, round, and reactive to light.  Cardiovascular:     Rate and Rhythm: Normal rate and regular rhythm.     Pulses: Normal pulses.     Heart sounds: Normal heart sounds.  Pulmonary:     Effort: Pulmonary effort is normal.     Breath sounds: Normal breath sounds.  Abdominal:     General: Abdomen is flat. Bowel sounds are normal.     Palpations: Abdomen is soft.  Musculoskeletal:        General: Swelling present.     Comments: Left Knee Some Swelling but no Pain Right Knee Pain on Movement. No Swelling or redness Also Right shoulder Pain  Skin:    General: Skin is warm.  Neurological:     General: No focal deficit present.     Mental Status: He is alert.  Psychiatric:        Mood and Affect: Mood normal.     Labs reviewed: Recent Labs    08/10/19 0000 02/24/20 0000 03/22/20 0000  NA 140 141 139  K 4.1 4.0 4.3  CL 104 105 104  CO2 28* 29* 29*  BUN 17 20 21   CREATININE 0.9 0.9 1.0  CALCIUM  --  9.0 8.6*   Recent Labs    08/10/19 0000 02/24/20 0000  AST 15 17  ALT 9* 15  ALKPHOS 79 88  ALBUMIN  --  4.2   Recent Labs    08/10/19 0000 02/24/20 0000  WBC 4.9 5.3  NEUTROABS 2,773 3,222  HGB 14.2 13.8  HCT 43 42  PLT 210 171   Lab Results  Component Value Date   TSH 7.57 (A) 02/24/2020   No results found for: HGBA1C Lab Results  Component Value Date   CHOL 139 02/24/2020   HDL 37 02/24/2020   LDLCALC 82 02/24/2020   TRIG 100 02/24/2020   CHOLHDL 5.1 (H) 03/31/2019  Significant Diagnostic Results in last 30 days:  No results found.  Assessment/Plan Primary osteoarthritis involving multiple joints Increase Meloxicam to 15 mg Discontinue PRN Meloxicam Dr Yvonne Kendall Consult Xray of  Right Knee and HIp  Idiopathic peripheral neuropathy  On Neurontin Hypothyroidism, unspecified type Repeat TSH in few months Bilateral leg edema Same dose of lasix Refuses for higher dose due to Frequency Hyperlipidemia, unspecified hyperlipidemia type On Statin  LDL less then 100 Urinary frequency On Flomax    Family/ staff Communication:   Labs/tests ordered:

## 2020-05-02 DIAGNOSIS — M25551 Pain in right hip: Secondary | ICD-10-CM | POA: Diagnosis not present

## 2020-05-02 DIAGNOSIS — M25561 Pain in right knee: Secondary | ICD-10-CM | POA: Diagnosis not present

## 2020-05-10 ENCOUNTER — Non-Acute Institutional Stay: Payer: Medicare PPO | Admitting: Nurse Practitioner

## 2020-05-10 ENCOUNTER — Encounter: Payer: Self-pay | Admitting: Nurse Practitioner

## 2020-05-10 DIAGNOSIS — E039 Hypothyroidism, unspecified: Secondary | ICD-10-CM

## 2020-05-10 DIAGNOSIS — G609 Hereditary and idiopathic neuropathy, unspecified: Secondary | ICD-10-CM

## 2020-05-10 DIAGNOSIS — I8290 Acute embolism and thrombosis of unspecified vein: Secondary | ICD-10-CM

## 2020-05-10 DIAGNOSIS — R350 Frequency of micturition: Secondary | ICD-10-CM

## 2020-05-10 DIAGNOSIS — K5903 Drug induced constipation: Secondary | ICD-10-CM | POA: Diagnosis not present

## 2020-05-10 DIAGNOSIS — M8949 Other hypertrophic osteoarthropathy, multiple sites: Secondary | ICD-10-CM | POA: Diagnosis not present

## 2020-05-10 DIAGNOSIS — K219 Gastro-esophageal reflux disease without esophagitis: Secondary | ICD-10-CM

## 2020-05-10 DIAGNOSIS — M159 Polyosteoarthritis, unspecified: Secondary | ICD-10-CM

## 2020-05-10 NOTE — Assessment & Plan Note (Signed)
Stable, continue Senokot S

## 2020-05-10 NOTE — Assessment & Plan Note (Signed)
Stable, continue Omeprazole daily.

## 2020-05-10 NOTE — Assessment & Plan Note (Signed)
No urinary retention,  stable, on Tamsulosin 

## 2020-05-10 NOTE — Assessment & Plan Note (Signed)
Continue Levothyroxine 54mcg increased 02/24/20, due f/u TSH

## 2020-05-10 NOTE — Assessment & Plan Note (Signed)
Peripheral neuropathy, weakness in legs, w/c for mobility, on Gabapentin 100mg qd.   

## 2020-05-10 NOTE — Assessment & Plan Note (Addendum)
Left knee OA, now more pain in the right knee, takign Mobic 15 mg qd since 05/01/20, X-ray R hip/knee Dr. Lyndel Safe ordered 05/01/20- results can not be located, pending Ortho consultation.

## 2020-05-10 NOTE — Assessment & Plan Note (Signed)
Chronic edema BLE, continue Furosemide 

## 2020-05-10 NOTE — Progress Notes (Signed)
Location:   [place] Room Number: [place] of Service:  ALF (13) Provider: [name] NP  Zakara Parkey X, NP  Patient Care Team: Shaheen Mende X, NP as PCP - General (Internal Medicine) [name], [name], MD as Consulting Physician (Orthopedic Surgery) [name], MD as Consulting Physician (Gastroenterology) System, Provider Not In [name], MD as Consulting Physician (Neurosurgery) [name], MD as Referring Physician (Ophthalmology) Rosalin Buster X, NP as Nurse Practitioner (Internal Medicine) Almedia Balls, MD as Referring Physician (Orthopedic Surgery) [name], MD as Consulting Physician (Neurosurgery) Elsie Saas, MD as Consulting Physician (Orthopedic Surgery) Allyn Kenner, MD (Dermatology) Kathrynn Ducking, MD as Consulting Physician (Neurology) Franchot Gallo, MD as Consulting Physician (Urology)  Extended Emergency Contact Information Primary Emergency Contact: [name] Address: [address]          [place]          [place], [place] [address] [place] Phone: [phone] Relation: Spouse  Code Status:  DNR Goals of care: Advanced Directive information Advanced Directives 05/10/2020  Does Patient Have a Medical Advance Directive? Yes  Type of Paramedic of [place];Living will;Out of facility DNR (pink MOST or yellow form)  Does patient want to make changes to medical advance directive? No - Patient declined  Copy of [place] in Chart? Yes - validated most recent copy scanned in chart (See row information)  Pre-existing out of facility DNR order (yellow form or pink MOST form) Pink MOST form placed in chart (order not valid for inpatient use)     Chief Complaint  Patient presents with  . Medical Management of Chronic Issues  . Health Maintenance    PCV 13 and TDAP    HPI:  Pt is a 86 y.o. male seen today for medical management of  chronic diseases.      Peripheral neuropathy, weakness in legs, w/c for mobility, on Gabapentin 100mg  qd.   Left knee OA, takign Mobic 15 mg qd since 05/01/20, X-ray R hip/knee  Urinary frequency, stable, on Tamsulosin.   Hypothyroidism, takes Levothyroxine  Edema BLE, takes Furosemide  Constipation, takes Senokot S,   GERD, takes Omeprazole 20mg  qd.   Past Medical History:  Diagnosis Date  . Abnormal MRI, knee    Per [facility] New Patient Packet   . Anemia   . Anticoagulated   . Anxiety   . Arthritis   . Cervical disc disorder    Per [facility] Patient Packet   . Complication of anesthesia    states "I was [place] several weeks after surgery"caused by tramadol  . Constipation   . Dysuria 07/28/2016  . Edema 07/24/2016  . Gait abnormality 07/25/2016  . Hard of hearing   . Head injury    As a result of a fall, Per [facility] New Patient Packet   . Hip arthritis 07/21/2016  . History of fall 07/25/2016  . Hypercholesteremia   . Hypertension   . Lumbar spinal stenosis   . Memory loss 07/28/2016   mild  . Obesity    Per [facility] New Patient Packet   . Osteoarthritis of right hip 07/24/2016  . Peripheral neuropathy 03/10/2018  . Sleep apnea    cpap  . Spinal stenosis of lumbar region 05/28/2016  . Urinary frequency   . Venous thrombosis of leg 07/24/2016   right gastrocnemius  . Vertigo   . Wears glasses   . Weight loss 04/08/2016  Past Surgical History:  Procedure Laterality Date  . BACK SURGERY  10/17, 80's  . COLONOSCOPY    . COLONOSCOPY     Per [facility] New Patient Packet, [place]   . ESOPHAGOGASTRODUODENOSCOPY    . LUMBAR LAMINECTOMY/DECOMPRESSION MICRODISCECTOMY Right 05/28/2016   Procedure: Right Lumbar Three-Four Microdiscectomy;  Surgeon: [name], MD;  Location: [place] NEURO ORS;  Service: Neurosurgery;  Laterality: Right;  . TOTAL HIP ARTHROPLASTY Right 07/21/2016   Procedure: TOTAL HIP ARTHROPLASTY ANTERIOR APPROACH;  Surgeon: [name], MD;  Location: [place];  Service: Orthopedics;  Laterality: Right;    Allergies  Allergen Reactions  . Tramadol Other (See Comments)    "does not eat" LOSS OF APPETITE    Allergies as of 05/10/2020      Reactions   Tramadol Other (See Comments)   "does not eat" LOSS OF APPETITE      Medication List       Accurate as of May 10, 2020 11:59 PM. If you have any questions, ask your nurse or doctor.        acetaminophen 500 MG tablet Commonly known as: TYLENOL Take 1,000 mg by mouth every 8 (eight) hours as needed.   aspirin EC 81 MG tablet Take 81 mg by mouth daily. 8am.   atorvastatin 20 MG tablet Commonly known as: LIPITOR Take 20 mg by mouth daily. 8pm.   B-12 1000 MCG Tabs Take 1 tablet by mouth every other day. 8am.   CoQ10 100 MG Caps Take 1 capsule by mouth daily. 8am.   diphenhydrAMINE 25 MG tablet Commonly known as: BENADRYL Take 25 mg by mouth as needed.   fexofenadine 180 MG tablet Commonly known as: ALLEGRA Take 180 mg by mouth daily as needed for allergies or rhinitis.   fluticasone 50 MCG/ACT nasal spray Commonly known as: FLONASE Place 2 sprays into both nostrils as needed.   furosemide 20 MG tablet Commonly known as: LASIX Take 20 mg by mouth. Mon, Wed, Fri   gabapentin 100 MG capsule Commonly known as: NEURONTIN Take 100 mg by mouth at bedtime. 8pm.   Glucosamine 500 MG Caps Take by mouth. Take 3 pills to equal 1500mg  daily.   hydrocortisone 2.5 % cream Apply topically 2 (two) times daily as needed.   ketoconazole 2 % cream Commonly known as: NIZORAL Apply 1 application topically 2 (two) times daily as needed for irritation.   meclizine 25 MG tablet Commonly known as: ANTIVERT Take 25 mg by mouth 3 (three) times daily as needed for dizziness.   meloxicam 7.5 MG tablet Commonly known as: MOBIC Take 15 mg by mouth daily.   NAT-RUL PSYLLIUM SEED HUSKS PO Take 1 tablet by mouth as needed. For constipation.   omeprazole 20 MG capsule Commonly  known as: PRILOSEC Take 20 mg by mouth daily.   sennosides-docusate sodium 8.6-50 MG tablet Commonly known as: SENOKOT-S Take 1 tablet by mouth daily.   sodium chloride 0.65 % Soln nasal spray Commonly known as: OCEAN Place 2 sprays into both nostrils as needed for congestion.   Synthroid 50 MCG tablet Generic drug: levothyroxine Take 50 mcg by mouth daily before breakfast.   tamsulosin 0.4 MG Caps capsule Commonly known as: FLOMAX Take 0.4 mg by mouth at bedtime.       Review of Systems  Constitutional: Negative for fatigue, fever and unexpected weight change.  HENT: Positive for hearing loss. Negative for congestion and voice change.   Eyes: Negative for visual disturbance.  Respiratory: Negative for  cough and shortness of breath.   Cardiovascular: Positive for leg swelling. Negative for chest pain and palpitations.  Gastrointestinal: Negative for abdominal pain and constipation.  Genitourinary: Positive for frequency. Negative for difficulty urinating and urgency.  Musculoskeletal: Positive for arthralgias, back pain and gait problem.       R+L knees, left shoulder, s/p cervical spine+lumber spien surgeries. Mostly pain is in the right knee presently.   Skin: Negative for color change.  Neurological: Positive for weakness and numbness. Negative for speech difficulty.       Memory lapses. Tingling, numbness, weakness in legs.  Psychiatric/Behavioral: Negative for behavioral problems, dysphoric mood and sleep disturbance.    Immunization History  Administered Date(s) Administered  . Influenza-Unspecified 06/11/2015, 06/11/2019  . Moderna SARS-COVID-2 Vaccination 09/10/2019, 10/08/2019  . PFIZER SARS-COV-2 Vaccination 09/10/2019  . Pneumococcal Polysaccharide-23 04/21/2011  . Pneumococcal-Unspecified 04/21/2019  . Td 04/22/2002  . Zoster 04/27/2012   Pertinent  Health Maintenance Due  Topic Date Due  . INFLUENZA VACCINE  04/08/2020  . PNA vac Low Risk Adult (2 of 2  - PCV13) 04/20/2020   Fall Risk  03/24/2019 02/08/2018 07/28/2016  Falls in the past year? 0 Yes Yes  Number falls in past yr: 0 1 1  Injury with Fall? 0 No No  Risk for fall due to : - - History of fall(s);Impaired balance/gait;Impaired mobility  Follow up - - Falls evaluation completed   Functional Status Survey:    Vitals:   05/10/20 1529  BP: 130/65  Pulse: 64  Resp: 17  Temp: 98.3 F (36.8 C)  SpO2: 97%  Weight: 210 lb 9.6 oz (95.5 kg)  Height: 5\' 4"  (1.626 m)   Body mass index is 36.15 kg/m. Physical Exam Vitals and nursing note reviewed.  Constitutional:      Appearance: Normal appearance. He is normal weight.  HENT:     Head: Normocephalic and atraumatic.  Eyes:     Extraocular Movements: Extraocular movements intact.     Conjunctiva/sclera: Conjunctivae normal.     Pupils: Pupils are equal, round, and reactive to light.  Cardiovascular:     Rate and Rhythm: Normal rate and regular rhythm.     Heart sounds: No murmur heard.   Pulmonary:     Effort: Pulmonary effort is normal.     Breath sounds: No rales.  Abdominal:     General: Bowel sounds are normal.     Palpations: Abdomen is soft.     Tenderness: There is no abdominal tenderness.  Musculoskeletal:     Cervical back: Normal range of motion and neck supple.     Right lower leg: Edema present.     Left lower leg: Edema present.     Comments: trace edema BLE. Left shoulder pain with ROM is chronic. Chronic R+L knee pain-mostly in the left knee, knee support L knee. S/p R hips replacement. S/p cervical/lumbar spine surgeries.   Skin:    General: Skin is warm and dry.  Neurological:     General: No focal deficit present.     Mental Status: He is alert. Mental status is at baseline.     Motor: Weakness present.     Gait: Gait abnormal.     Comments: Moves slow, uses electric w/c for mobility,  lower body weakness, peripheral neuropathy in legs.   Psychiatric:        Mood and Affect: Mood normal.         Behavior: Behavior normal.  Thought Content: Thought content normal.     Comments: Oriented to person, place.      Labs reviewed: Recent Labs    08/10/19 0000 02/24/20 0000 03/22/20 0000  NA 140 141 139  K 4.1 4.0 4.3  CL 104 105 104  CO2 28* 29* 29*  BUN 17 20 21   CREATININE 0.9 0.9 1.0  CALCIUM  --  9.0 8.6*   Recent Labs    08/10/19 0000 02/24/20 0000  AST 15 17  ALT 9* 15  ALKPHOS 79 88  ALBUMIN  --  4.2   Recent Labs    08/10/19 0000 02/24/20 0000  WBC 4.9 5.3  NEUTROABS 2,773 3,222  HGB 14.2 13.8  HCT 43 42  PLT 210 171   Lab Results  Component Value Date   TSH 7.57 (A) 02/24/2020   No results found for: HGBA1C Lab Results  Component Value Date   CHOL 139 02/24/2020   HDL 37 02/24/2020   LDLCALC 82 02/24/2020   TRIG 100 02/24/2020   CHOLHDL 5.1 (H) 03/31/2019    Significant Diagnostic Results in last 30 days:  No results found.  Assessment/Plan  Venous thrombosis of leg Chronic edema BLE, continue Furosemide.   Hypothyroidism Continue Levothyroxine 71mcg increased 02/24/20, due f/u TSH  Peripheral neuropathy Peripheral neuropathy, weakness in legs, w/c for mobility, on Gabapentin 100mg  qd.    Osteoarthritis, multiple sites Left knee OA, now more pain in the right knee, takign Mobic 15 mg qd since 05/01/20, X-ray R hip/knee Dr. Lyndel Safe ordered 05/01/20- results can not be located, pending Ortho consultation.    Urinary frequency No urinary retention, stable, on Tamsulosin.   Constipation Stable, continue Senokot S  GERD (gastroesophageal reflux disease) Stable, continue Omeprazole daily.    Family/ staff Communication: plan of care reviewed with the patient and charge nurse. Tdap, Prevnar 13 prescription provided.   Labs/tests ordered:  none  Time spend 40 minutes.

## 2020-05-11 ENCOUNTER — Encounter: Payer: Self-pay | Admitting: Nurse Practitioner

## 2020-05-17 DIAGNOSIS — R946 Abnormal results of thyroid function studies: Secondary | ICD-10-CM | POA: Diagnosis not present

## 2020-05-17 DIAGNOSIS — I10 Essential (primary) hypertension: Secondary | ICD-10-CM | POA: Diagnosis not present

## 2020-05-18 LAB — TSH: TSH: 4.34 (ref 0.41–5.90)

## 2020-05-22 ENCOUNTER — Encounter: Payer: Self-pay | Admitting: Internal Medicine

## 2020-05-22 ENCOUNTER — Non-Acute Institutional Stay: Payer: Medicare PPO | Admitting: Internal Medicine

## 2020-05-22 DIAGNOSIS — L89892 Pressure ulcer of other site, stage 2: Secondary | ICD-10-CM | POA: Diagnosis not present

## 2020-05-22 DIAGNOSIS — M159 Polyosteoarthritis, unspecified: Secondary | ICD-10-CM

## 2020-05-22 DIAGNOSIS — E785 Hyperlipidemia, unspecified: Secondary | ICD-10-CM

## 2020-05-22 DIAGNOSIS — M150 Primary generalized (osteo)arthritis: Secondary | ICD-10-CM

## 2020-05-22 DIAGNOSIS — G609 Hereditary and idiopathic neuropathy, unspecified: Secondary | ICD-10-CM

## 2020-05-22 DIAGNOSIS — R600 Localized edema: Secondary | ICD-10-CM

## 2020-05-22 DIAGNOSIS — M8949 Other hypertrophic osteoarthropathy, multiple sites: Secondary | ICD-10-CM

## 2020-05-22 DIAGNOSIS — E039 Hypothyroidism, unspecified: Secondary | ICD-10-CM

## 2020-05-22 NOTE — Progress Notes (Signed)
Location:   [place] Room Number: [place]:  ALF [id number]) Provider:  Veleta Miners MD  Mast, Man X, NP  Patient Care Team: Mast, Man X, NP as PCP - General (Internal Medicine) [name], [name], MD as Consulting Physician (Orthopedic Surgery) [name], MD as Consulting Physician (Gastroenterology) System, Provider Not In [name], MD as Consulting Physician (Neurosurgery) [name], MD as Referring Physician (Ophthalmology) Mast, Man X, NP as Nurse Practitioner (Internal Medicine) Almedia Balls, MD as Referring Physician (Orthopedic Surgery) [name], MD as Consulting Physician (Neurosurgery) Elsie Saas, MD as Consulting Physician (Orthopedic Surgery) Allyn Kenner, MD (Dermatology) Kathrynn Ducking, MD as Consulting Physician (Neurology) Franchot Gallo, MD as Consulting Physician (Urology)  Extended Emergency Contact Information Primary Emergency Contact: [name] Address: [address]          [place], [place] [address] [place] Phone: [phone] Relation: Spouse  Code Status:  Full Code Goals of care: Advanced Directive information Advanced Directives 05/10/2020  Does Patient Have a Medical Advance Directive? Yes  Type of Paramedic of [place];Living will;Out of facility DNR (pink MOST or yellow form)  Does patient want to make changes to medical advance directive? No - Patient declined  Copy of [place] in Chart? Yes - validated most recent copy scanned in chart (See row information)  Pre-existing out of facility DNR order (yellow form or pink MOST form) Pink MOST form placed in chart (order not valid for inpatient use)     Chief Complaint  Patient presents with  . Acute Visit    Pressure ulcer in Inguinal area    HPI:  Pt is a 86 y.o. male seen today for an acute visit for Pressure ulcer of inguinal   area  Patient has h/o Peripheral Neuropathy Idiopathic, BPH with urinary Frequency, LE edema,  Arthritis, Sleep Apnea, Hyperlipidemia  Seen today for Pressure ulcer in Right Inguinal area It is below the Skin folds No discharge or redness Is tender  Past Medical History:  Diagnosis Date  . Abnormal MRI, knee    Per [facility] New Patient Packet   . Anemia   . Anticoagulated   . Anxiety   . Arthritis   . Cervical disc disorder    Per [facility] Patient Packet   . Complication of anesthesia    states "I was in [place] several weeks after surgery"caused by tramadol  . Constipation   . Dysuria 07/28/2016  . Edema 07/24/2016  . Gait abnormality 07/25/2016  . Hard of hearing   . Head injury    As a result of a fall, Per [facility] New Patient Packet   . Hip arthritis 07/21/2016  . History of fall 07/25/2016  . Hypercholesteremia   . Hypertension   . Lumbar spinal stenosis   . Memory loss 07/28/2016   mild  . Obesity    Per [facility] New Patient Packet   . Osteoarthritis of right hip 07/24/2016  . Peripheral neuropathy 03/10/2018  . Sleep apnea    cpap  . Spinal stenosis of lumbar region 05/28/2016  . Urinary frequency   . Venous thrombosis of leg 07/24/2016   right gastrocnemius  . Vertigo   . Wears glasses   . Weight loss 04/08/2016   Past Surgical History:  Procedure Laterality Date  . BACK SURGERY  10/17, 80's  . COLONOSCOPY    .  COLONOSCOPY     Per [facility] New Patient Packet, [place]   . ESOPHAGOGASTRODUODENOSCOPY    . LUMBAR LAMINECTOMY/DECOMPRESSION MICRODISCECTOMY Right 05/28/2016   Procedure: Right Lumbar Three-Four Microdiscectomy;  Surgeon: [name], MD;  Location: [place] NEURO ORS;  Service: Neurosurgery;  Laterality: Right;  . TOTAL HIP ARTHROPLASTY Right 07/21/2016   Procedure: TOTAL HIP ARTHROPLASTY ANTERIOR APPROACH;  Surgeon: [name], MD;  Location: [place];  Service: Orthopedics;  Laterality: Right;    Allergies  Allergen Reactions  . Tramadol Other (See  Comments)    "does not eat" LOSS OF APPETITE    Allergies as of 05/22/2020      Reactions   Tramadol Other (See Comments)   "does not eat" LOSS OF APPETITE      Medication List       Accurate as of May 22, 2020  2:09 PM. If you have any questions, ask your nurse or doctor.        acetaminophen 500 MG tablet Commonly known as: TYLENOL Take 1,000 mg by mouth every 8 (eight) hours as needed.   aspirin EC 81 MG tablet Take 81 mg by mouth daily. 8am.   atorvastatin 20 MG tablet Commonly known as: LIPITOR Take 20 mg by mouth daily. 8pm.   B-12 1000 MCG Tabs Take 1 tablet by mouth every other day. 8am.   CoQ10 100 MG Caps Take 1 capsule by mouth daily. 8am.   diphenhydrAMINE 25 MG tablet Commonly known as: BENADRYL Take 25 mg by mouth as needed.   fexofenadine 180 MG tablet Commonly known as: ALLEGRA Take 180 mg by mouth daily as needed for allergies or rhinitis.   fluticasone 50 MCG/ACT nasal spray Commonly known as: FLONASE Place 2 sprays into both nostrils as needed.   furosemide 20 MG tablet Commonly known as: LASIX Take 20 mg by mouth. Mon, Wed, Fri   gabapentin 100 MG capsule Commonly known as: NEURONTIN Take 100 mg by mouth at bedtime. 8pm.   Glucosamine 500 MG Caps Take by mouth. Take 3 pills to equal 1500mg  daily.   hydrocortisone 2.5 % cream Apply topically 2 (two) times daily as needed.   ketoconazole 2 % cream Commonly known as: NIZORAL Apply 1 application topically 2 (two) times daily as needed for irritation.   meclizine 25 MG tablet Commonly known as: ANTIVERT Take 25 mg by mouth 3 (three) times daily as needed for dizziness.   meloxicam 7.5 MG tablet Commonly known as: MOBIC Take 15 mg by mouth daily.   NAT-RUL PSYLLIUM SEED HUSKS PO Take 1 tablet by mouth as needed. For constipation.   omeprazole 20 MG capsule Commonly known as: PRILOSEC Take 20 mg by mouth daily.   sennosides-docusate sodium 8.6-50 MG tablet Commonly  known as: SENOKOT-S Take 1 tablet by mouth daily.   sodium chloride 0.65 % Soln nasal spray Commonly known as: OCEAN Place 2 sprays into both nostrils as needed for congestion.   Synthroid 50 MCG tablet Generic drug: levothyroxine Take 50 mcg by mouth daily before breakfast.   tamsulosin 0.4 MG Caps capsule Commonly known as: FLOMAX Take 0.4 mg by mouth at bedtime.       Review of Systems  Constitutional: Negative.   HENT: Negative.   Respiratory: Negative.   Cardiovascular: Positive for leg swelling.  Gastrointestinal: Negative.   Genitourinary: Positive for frequency.  Musculoskeletal: Positive for arthralgias, gait problem and myalgias.  Skin: Positive for wound.  Neurological: Negative for dizziness.  Psychiatric/Behavioral: Negative.  Immunization History  Administered Date(s) Administered  . Influenza-Unspecified 06/11/2015, 06/11/2019  . Moderna SARS-COVID-2 Vaccination 09/10/2019, 10/08/2019  . PFIZER SARS-COV-2 Vaccination 09/10/2019  . Pneumococcal Polysaccharide-23 04/21/2011  . Pneumococcal-Unspecified 04/21/2019  . Td 04/22/2002  . Zoster 04/27/2012   Pertinent  Health Maintenance Due  Topic Date Due  . INFLUENZA VACCINE  04/08/2020  . PNA vac Low Risk Adult (2 of 2 - PCV13) 04/20/2020   Fall Risk  03/24/2019 02/08/2018 07/28/2016  Falls in the past year? 0 Yes Yes  Number falls in past yr: 0 1 1  Injury with Fall? 0 No No  Risk for fall due to : - - History of fall(s);Impaired balance/gait;Impaired mobility  Follow up - - Falls evaluation completed   Functional Status Survey:    Vitals:   05/22/20 1404  BP: (!) 120/58  Pulse: 70  Resp: 18  Temp: 97.9 F (36.6 C)  SpO2: 95%  Weight: 210 lb 9.6 oz (95.5 kg)  Height: 5\' 4"  (1.626 m)   Body mass index is 36.15 kg/m. Physical Exam Vitals reviewed.  Constitutional:      Appearance: Normal appearance.  HENT:     Head: Normocephalic.     Nose: Nose normal.     Mouth/Throat:     Mouth:  Mucous membranes are moist.     Pharynx: Oropharynx is clear.  Eyes:     Pupils: Pupils are equal, round, and reactive to light.  Cardiovascular:     Rate and Rhythm: Normal rate and regular rhythm.     Pulses: Normal pulses.     Heart sounds: Normal heart sounds.  Pulmonary:     Effort: Pulmonary effort is normal.     Breath sounds: Normal breath sounds.  Abdominal:     General: Abdomen is flat. Bowel sounds are normal.     Palpations: Abdomen is soft.  Musculoskeletal:        General: Swelling present.     Cervical back: Neck supple.  Skin:    General: Skin is warm.     Comments: Coin sized Ulcer in Right Inguinal Area with Clean Base No Signs of Infection  Neurological:     General: No focal deficit present.     Mental Status: He is alert.  Psychiatric:        Mood and Affect: Mood normal.        Thought Content: Thought content normal.     Labs reviewed: Recent Labs    08/10/19 0000 02/24/20 0000 03/22/20 0000  NA 140 141 139  K 4.1 4.0 4.3  CL 104 105 104  CO2 28* 29* 29*  BUN 17 20 21   CREATININE 0.9 0.9 1.0  CALCIUM  --  9.0 8.6*   Recent Labs    08/10/19 0000 02/24/20 0000  AST 15 17  ALT 9* 15  ALKPHOS 79 88  ALBUMIN  --  4.2   Recent Labs    08/10/19 0000 02/24/20 0000  WBC 4.9 5.3  NEUTROABS 2,773 3,222  HGB 14.2 13.8  HCT 43 42  PLT 210 171   Lab Results  Component Value Date   TSH 4.34 05/18/2020   No results found for: HGBA1C Lab Results  Component Value Date   CHOL 139 02/24/2020   HDL 37 02/24/2020   LDLCALC 82 02/24/2020   TRIG 100 02/24/2020   CHOLHDL 5.1 (H) 03/31/2019    Significant Diagnostic Results in last 30 days:  No results found.  Assessment/Plan  Pressure injury of other  site, stage 2 (HCC) Hydrocolloid Dressing QOD with Skin Prep Continue Silver Alginate Try to keep Pressure off Primary osteoarthritis involving multiple joints On Meloxicam Has Appointment with Dr Philipp Ovens Idiopathic peripheral  neuropathy On Neurontin Uses Power Chair Hypothyroidism, unspecified type TSH normal Bilateral leg edema Doing well on Lasix Hyperlipidemia, unspecified hyperlipidemia type On Statin LDL less then 100 Urinary Frequency On Flomax  Family/ staff Communication:   Labs/tests ordered:

## 2020-05-24 ENCOUNTER — Ambulatory Visit (INDEPENDENT_AMBULATORY_CARE_PROVIDER_SITE_OTHER): Payer: Medicare PPO | Admitting: Orthopaedic Surgery

## 2020-05-24 ENCOUNTER — Ambulatory Visit (INDEPENDENT_AMBULATORY_CARE_PROVIDER_SITE_OTHER): Payer: Medicare PPO

## 2020-05-24 ENCOUNTER — Other Ambulatory Visit: Payer: Self-pay

## 2020-05-24 ENCOUNTER — Encounter: Payer: Self-pay | Admitting: Orthopaedic Surgery

## 2020-05-24 VITALS — Ht 64.0 in | Wt 210.0 lb

## 2020-05-24 DIAGNOSIS — M25561 Pain in right knee: Secondary | ICD-10-CM | POA: Diagnosis not present

## 2020-05-24 DIAGNOSIS — M25551 Pain in right hip: Secondary | ICD-10-CM | POA: Diagnosis not present

## 2020-05-24 DIAGNOSIS — G8929 Other chronic pain: Secondary | ICD-10-CM

## 2020-05-24 MED ORDER — LIDOCAINE HCL 1 % IJ SOLN
3.0000 mL | INTRAMUSCULAR | Status: AC | PRN
  Administered 2020-05-24: 3 mL

## 2020-05-24 MED ORDER — METHYLPREDNISOLONE ACETATE 40 MG/ML IJ SUSP
40.0000 mg | INTRAMUSCULAR | Status: AC | PRN
  Administered 2020-05-24: 40 mg via INTRA_ARTICULAR

## 2020-05-24 NOTE — Progress Notes (Signed)
Office Visit Note   Patient: [name]           Date of Birth: 1931-07-10           MRN: [id number] Visit Date: 05/24/2020              Requested by: Mast, Man X, NP [address],  [place] [address] PCP: Mast, Man X, NP   Assessment & Plan: Visit Diagnoses:  1. Chronic pain of right knee   2. Pain in right hip     Plan: Based on his clinical exam findings I have recommended only a steroid injection in his right knee today.  He did agree with this and we placed in his right knee without difficulty and he tolerated it well.  I have recommended physical therapy at his facility for strengthening the right lower extremity.  All question concerns were answered and addressed.  Follow-up will be as needed.  Follow-Up Instructions: Return if symptoms worsen or fail to improve.   Orders:  Orders Placed This Encounter  Procedures  . Large Joint Inj  . XR Knee 1-2 Views Right  . XR HIP UNILAT W OR W/O PELVIS 1V RIGHT   No orders of the defined types were placed in this encounter.     Procedures: Large Joint Inj: R knee on 05/24/2020 4:49 PM Indications: diagnostic evaluation and pain Details: 22 G 1.5 in needle, superolateral approach  Arthrogram: No  Medications: 3 mL lidocaine 1 %; 40 mg methylPREDNISolone acetate 40 MG/ML Outcome: tolerated well, no immediate complications Procedure, treatment alternatives, risks and benefits explained, specific risks discussed. Consent was given by the patient. Immediately prior to procedure a time out was called to verify the correct patient, procedure, equipment, support staff and site/side marked as required. Patient was prepped and draped in the usual sterile fashion.       Clinical Data: No additional findings.   Subjective: Chief Complaint  Patient presents with  . Right Knee - Pain  . Right Hip - Pain  The patient is a very pleasant 86 year old gentleman I am seeing for the first time.  He has been an  established patient of Dr. [name] but has not seen Dr. [name] in about 3 years.  Dr. [name] actually replaced his right hip in 2017 through a direct anterior approach.  He lives at [facility] with his wife and he is referred here for evaluation and treatment of right knee and hip pain but it seems like his right knee is what really hurts him the most.  He ambulates with a rolling walker.  They do state that there is physical therapy available at where they stay.  He has had a harder time mobilizing in general.  He really describes more of right knee pain that he does right hip pain.  He is also had extensive spine surgery with microdiscectomy at L3/L4 in 2017.  HPI  Review of Systems Today he denies any headache, chest pain, shortness of breath, fever, chills, nausea, vomiting  Objective: Vital Signs: Ht 5\' 4"  (1.626 m)   Wt 210 lb (95.3 kg)   BMI 36.05 kg/m   Physical Exam He is alert and orient x3 and in no acute distress Ortho Exam Examination of his right hip shows that it moves smoothly and fluidly.  Examination of his right knee shows significant patellofemoral crepitation but no effusion.  There is no significant medial lateral joint line tenderness today with good range of motion  but it is painful. Specialty Comments:  No specialty comments available.  Imaging: XR HIP UNILAT W OR W/O PELVIS 1V RIGHT  Result Date: 05/24/2020 A low AP pelvis and lateral right hip shows a well-seated total hip arthroplasty with no complicating features.  XR Knee 1-2 Views Right  Result Date: 05/24/2020 2 views of the right knee show no acute findings.  The medial lateral joint spaces are well-maintained.  There is severe patellofemoral arthritic changes.    PMFS History: Patient Active Problem List   Diagnosis Date Noted  . GERD (gastroesophageal reflux disease) 05/10/2020  . Pressure ulcer, stage II ([place]) 03/15/2020  . Seborrheic dermatitis 02/24/2020  . Fall 01/02/2020  . Left shoulder pain  11/02/2019  . Osteoarthritis, multiple sites 11/02/2019  . Hypothyroidism 08/10/2019  . Peripheral neuropathy 03/10/2018  . Unilateral primary osteoarthritis, right knee 12/02/2016  . Presence of right artificial hip joint 09/23/2016  . Acute pain of left knee 09/19/2016  . UTI (urinary tract infection) 07/30/2016  . Memory loss 07/28/2016  . Anemia   . Hypertension   . Lumbar spinal stenosis   . Sleep apnea   . Constipation   . Urinary frequency   . History of fall 07/25/2016  . Gait abnormality 07/25/2016  . Venous thrombosis of leg 07/24/2016  . Osteoarthritis of right hip 07/24/2016  . Edema 07/24/2016  . Spinal stenosis of lumbar region 05/28/2016  . Weight loss 04/08/2016  . Nuclear sclerosis of both eyes 01/04/2014  . Hypercholesteremia    Past Medical History:  Diagnosis Date  . Abnormal MRI, knee    Per [facility] New Patient Packet   . Anemia   . Anticoagulated   . Anxiety   . Arthritis   . Cervical disc disorder    Per [place] Patient Packet   . Complication of anesthesia    states "I was in [place] for several weeks after surgery"caused by tramadol  . Constipation   . Dysuria 07/28/2016  . Edema 07/24/2016  . Gait abnormality 07/25/2016  . Hard of hearing   . Head injury    As a result of a fall, Per [facility] New Patient Packet   . Hip arthritis 07/21/2016  . History of fall 07/25/2016  . Hypercholesteremia   . Hypertension   . Lumbar spinal stenosis   . Memory loss 07/28/2016   mild  . Obesity    Per [facility] New Patient Packet   . Osteoarthritis of right hip 07/24/2016  . Peripheral neuropathy 03/10/2018  . Sleep apnea    cpap  . Spinal stenosis of lumbar region 05/28/2016  . Urinary frequency   . Venous thrombosis of leg 07/24/2016   right gastrocnemius  . Vertigo   . Wears glasses   . Weight loss 04/08/2016    Family History  Problem Relation Age of Onset  . Heart disease Mother   . Transient ischemic attack Mother   . Kidney failure Father   .  Alzheimer's disease Sister   . AAA (abdominal aortic aneurysm) Sister   . Lung cancer Brother   . Memory loss Sister   . Memory loss Sister   . Heart disease Sister   . Cancer Sister   . Diabetes Sister   . Cancer Sister     Past Surgical History:  Procedure Laterality Date  . BACK SURGERY  10/17, 80's  . COLONOSCOPY    . COLONOSCOPY     Per [facility] New Patient Packet, [place]   . ESOPHAGOGASTRODUODENOSCOPY    .  LUMBAR LAMINECTOMY/DECOMPRESSION MICRODISCECTOMY Right 05/28/2016   Procedure: Right Lumbar Three-Four Microdiscectomy;  Surgeon: [name], MD;  Location: [place] NEURO ORS;  Service: Neurosurgery;  Laterality: Right;  . TOTAL HIP ARTHROPLASTY Right 07/21/2016   Procedure: TOTAL HIP ARTHROPLASTY ANTERIOR APPROACH;  Surgeon: [name], MD;  Location: [place];  Service: Orthopedics;  Laterality: Right;   Social History   Occupational History  . Not on file  Tobacco Use  . Smoking status: Former Smoker    Packs/day: 1.00    Years: 4.00    Pack years: 4.00    Quit date: 07/16/1968    Years since quitting: 51.8  . Smokeless tobacco: Never Used  . Tobacco comment: quit 50 years ago  Vaping Use  . Vaping Use: Never used  Substance and Sexual Activity  . Alcohol use: No  . Drug use: No  . Sexual activity: Not Currently    Partners: Female

## 2020-05-29 DIAGNOSIS — M6281 Muscle weakness (generalized): Secondary | ICD-10-CM | POA: Diagnosis not present

## 2020-05-29 DIAGNOSIS — M25562 Pain in left knee: Secondary | ICD-10-CM | POA: Diagnosis not present

## 2020-05-29 DIAGNOSIS — M25551 Pain in right hip: Secondary | ICD-10-CM | POA: Diagnosis not present

## 2020-05-29 DIAGNOSIS — M25561 Pain in right knee: Secondary | ICD-10-CM | POA: Diagnosis not present

## 2020-05-29 DIAGNOSIS — M158 Other polyosteoarthritis: Secondary | ICD-10-CM | POA: Diagnosis not present

## 2020-05-29 DIAGNOSIS — Z9181 History of falling: Secondary | ICD-10-CM | POA: Diagnosis not present

## 2020-05-29 DIAGNOSIS — M2681 Anterior soft tissue impingement: Secondary | ICD-10-CM | POA: Diagnosis not present

## 2020-06-05 DIAGNOSIS — M2681 Anterior soft tissue impingement: Secondary | ICD-10-CM | POA: Diagnosis not present

## 2020-06-05 DIAGNOSIS — M158 Other polyosteoarthritis: Secondary | ICD-10-CM | POA: Diagnosis not present

## 2020-06-05 DIAGNOSIS — Z9181 History of falling: Secondary | ICD-10-CM | POA: Diagnosis not present

## 2020-06-05 DIAGNOSIS — M25562 Pain in left knee: Secondary | ICD-10-CM | POA: Diagnosis not present

## 2020-06-05 DIAGNOSIS — M25551 Pain in right hip: Secondary | ICD-10-CM | POA: Diagnosis not present

## 2020-06-05 DIAGNOSIS — M25561 Pain in right knee: Secondary | ICD-10-CM | POA: Diagnosis not present

## 2020-06-05 DIAGNOSIS — M6281 Muscle weakness (generalized): Secondary | ICD-10-CM | POA: Diagnosis not present

## 2020-06-06 DIAGNOSIS — M2681 Anterior soft tissue impingement: Secondary | ICD-10-CM | POA: Diagnosis not present

## 2020-06-06 DIAGNOSIS — M158 Other polyosteoarthritis: Secondary | ICD-10-CM | POA: Diagnosis not present

## 2020-06-06 DIAGNOSIS — Z9181 History of falling: Secondary | ICD-10-CM | POA: Diagnosis not present

## 2020-06-06 DIAGNOSIS — M25562 Pain in left knee: Secondary | ICD-10-CM | POA: Diagnosis not present

## 2020-06-06 DIAGNOSIS — M25561 Pain in right knee: Secondary | ICD-10-CM | POA: Diagnosis not present

## 2020-06-06 DIAGNOSIS — M6281 Muscle weakness (generalized): Secondary | ICD-10-CM | POA: Diagnosis not present

## 2020-06-06 DIAGNOSIS — M25551 Pain in right hip: Secondary | ICD-10-CM | POA: Diagnosis not present

## 2020-06-08 DIAGNOSIS — Z9181 History of falling: Secondary | ICD-10-CM | POA: Diagnosis not present

## 2020-06-08 DIAGNOSIS — M25561 Pain in right knee: Secondary | ICD-10-CM | POA: Diagnosis not present

## 2020-06-08 DIAGNOSIS — M158 Other polyosteoarthritis: Secondary | ICD-10-CM | POA: Diagnosis not present

## 2020-06-08 DIAGNOSIS — R2681 Unsteadiness on feet: Secondary | ICD-10-CM | POA: Diagnosis not present

## 2020-06-08 DIAGNOSIS — M25551 Pain in right hip: Secondary | ICD-10-CM | POA: Diagnosis not present

## 2020-06-08 DIAGNOSIS — M6281 Muscle weakness (generalized): Secondary | ICD-10-CM | POA: Diagnosis not present

## 2020-06-08 DIAGNOSIS — M25562 Pain in left knee: Secondary | ICD-10-CM | POA: Diagnosis not present

## 2020-06-12 DIAGNOSIS — M25561 Pain in right knee: Secondary | ICD-10-CM | POA: Diagnosis not present

## 2020-06-12 DIAGNOSIS — Z9181 History of falling: Secondary | ICD-10-CM | POA: Diagnosis not present

## 2020-06-12 DIAGNOSIS — M6281 Muscle weakness (generalized): Secondary | ICD-10-CM | POA: Diagnosis not present

## 2020-06-12 DIAGNOSIS — M25562 Pain in left knee: Secondary | ICD-10-CM | POA: Diagnosis not present

## 2020-06-12 DIAGNOSIS — M158 Other polyosteoarthritis: Secondary | ICD-10-CM | POA: Diagnosis not present

## 2020-06-12 DIAGNOSIS — M25551 Pain in right hip: Secondary | ICD-10-CM | POA: Diagnosis not present

## 2020-06-12 DIAGNOSIS — R2681 Unsteadiness on feet: Secondary | ICD-10-CM | POA: Diagnosis not present

## 2020-06-14 DIAGNOSIS — R2681 Unsteadiness on feet: Secondary | ICD-10-CM | POA: Diagnosis not present

## 2020-06-14 DIAGNOSIS — M158 Other polyosteoarthritis: Secondary | ICD-10-CM | POA: Diagnosis not present

## 2020-06-14 DIAGNOSIS — M6281 Muscle weakness (generalized): Secondary | ICD-10-CM | POA: Diagnosis not present

## 2020-06-14 DIAGNOSIS — M25551 Pain in right hip: Secondary | ICD-10-CM | POA: Diagnosis not present

## 2020-06-14 DIAGNOSIS — M25561 Pain in right knee: Secondary | ICD-10-CM | POA: Diagnosis not present

## 2020-06-14 DIAGNOSIS — M25562 Pain in left knee: Secondary | ICD-10-CM | POA: Diagnosis not present

## 2020-06-14 DIAGNOSIS — Z9181 History of falling: Secondary | ICD-10-CM | POA: Diagnosis not present

## 2020-06-15 DIAGNOSIS — M25551 Pain in right hip: Secondary | ICD-10-CM | POA: Diagnosis not present

## 2020-06-15 DIAGNOSIS — M25562 Pain in left knee: Secondary | ICD-10-CM | POA: Diagnosis not present

## 2020-06-15 DIAGNOSIS — R2681 Unsteadiness on feet: Secondary | ICD-10-CM | POA: Diagnosis not present

## 2020-06-15 DIAGNOSIS — Z9181 History of falling: Secondary | ICD-10-CM | POA: Diagnosis not present

## 2020-06-15 DIAGNOSIS — M6281 Muscle weakness (generalized): Secondary | ICD-10-CM | POA: Diagnosis not present

## 2020-06-15 DIAGNOSIS — M25561 Pain in right knee: Secondary | ICD-10-CM | POA: Diagnosis not present

## 2020-06-15 DIAGNOSIS — M158 Other polyosteoarthritis: Secondary | ICD-10-CM | POA: Diagnosis not present

## 2020-06-19 DIAGNOSIS — M25561 Pain in right knee: Secondary | ICD-10-CM | POA: Diagnosis not present

## 2020-06-19 DIAGNOSIS — M6281 Muscle weakness (generalized): Secondary | ICD-10-CM | POA: Diagnosis not present

## 2020-06-19 DIAGNOSIS — M25562 Pain in left knee: Secondary | ICD-10-CM | POA: Diagnosis not present

## 2020-06-19 DIAGNOSIS — M158 Other polyosteoarthritis: Secondary | ICD-10-CM | POA: Diagnosis not present

## 2020-06-19 DIAGNOSIS — R2681 Unsteadiness on feet: Secondary | ICD-10-CM | POA: Diagnosis not present

## 2020-06-19 DIAGNOSIS — M25551 Pain in right hip: Secondary | ICD-10-CM | POA: Diagnosis not present

## 2020-06-19 DIAGNOSIS — Z9181 History of falling: Secondary | ICD-10-CM | POA: Diagnosis not present

## 2020-06-21 DIAGNOSIS — M158 Other polyosteoarthritis: Secondary | ICD-10-CM | POA: Diagnosis not present

## 2020-06-21 DIAGNOSIS — M25551 Pain in right hip: Secondary | ICD-10-CM | POA: Diagnosis not present

## 2020-06-21 DIAGNOSIS — M25561 Pain in right knee: Secondary | ICD-10-CM | POA: Diagnosis not present

## 2020-06-21 DIAGNOSIS — M6281 Muscle weakness (generalized): Secondary | ICD-10-CM | POA: Diagnosis not present

## 2020-06-21 DIAGNOSIS — R2681 Unsteadiness on feet: Secondary | ICD-10-CM | POA: Diagnosis not present

## 2020-06-21 DIAGNOSIS — M25562 Pain in left knee: Secondary | ICD-10-CM | POA: Diagnosis not present

## 2020-06-21 DIAGNOSIS — Z9181 History of falling: Secondary | ICD-10-CM | POA: Diagnosis not present

## 2020-06-26 DIAGNOSIS — M6281 Muscle weakness (generalized): Secondary | ICD-10-CM | POA: Diagnosis not present

## 2020-06-26 DIAGNOSIS — M158 Other polyosteoarthritis: Secondary | ICD-10-CM | POA: Diagnosis not present

## 2020-06-26 DIAGNOSIS — M25562 Pain in left knee: Secondary | ICD-10-CM | POA: Diagnosis not present

## 2020-06-26 DIAGNOSIS — Z9181 History of falling: Secondary | ICD-10-CM | POA: Diagnosis not present

## 2020-06-26 DIAGNOSIS — M25561 Pain in right knee: Secondary | ICD-10-CM | POA: Diagnosis not present

## 2020-06-26 DIAGNOSIS — R2681 Unsteadiness on feet: Secondary | ICD-10-CM | POA: Diagnosis not present

## 2020-06-26 DIAGNOSIS — M25551 Pain in right hip: Secondary | ICD-10-CM | POA: Diagnosis not present

## 2020-06-28 DIAGNOSIS — Z9181 History of falling: Secondary | ICD-10-CM | POA: Diagnosis not present

## 2020-06-28 DIAGNOSIS — M25551 Pain in right hip: Secondary | ICD-10-CM | POA: Diagnosis not present

## 2020-06-28 DIAGNOSIS — R2681 Unsteadiness on feet: Secondary | ICD-10-CM | POA: Diagnosis not present

## 2020-06-28 DIAGNOSIS — M25562 Pain in left knee: Secondary | ICD-10-CM | POA: Diagnosis not present

## 2020-06-28 DIAGNOSIS — M6281 Muscle weakness (generalized): Secondary | ICD-10-CM | POA: Diagnosis not present

## 2020-06-28 DIAGNOSIS — M25561 Pain in right knee: Secondary | ICD-10-CM | POA: Diagnosis not present

## 2020-06-28 DIAGNOSIS — M158 Other polyosteoarthritis: Secondary | ICD-10-CM | POA: Diagnosis not present

## 2020-06-29 ENCOUNTER — Non-Acute Institutional Stay: Payer: Medicare PPO | Admitting: Nurse Practitioner

## 2020-06-29 ENCOUNTER — Encounter: Payer: Self-pay | Admitting: Nurse Practitioner

## 2020-06-29 DIAGNOSIS — R609 Edema, unspecified: Secondary | ICD-10-CM

## 2020-06-29 DIAGNOSIS — M159 Polyosteoarthritis, unspecified: Secondary | ICD-10-CM

## 2020-06-29 DIAGNOSIS — K219 Gastro-esophageal reflux disease without esophagitis: Secondary | ICD-10-CM | POA: Diagnosis not present

## 2020-06-29 DIAGNOSIS — G609 Hereditary and idiopathic neuropathy, unspecified: Secondary | ICD-10-CM

## 2020-06-29 DIAGNOSIS — K5903 Drug induced constipation: Secondary | ICD-10-CM

## 2020-06-29 DIAGNOSIS — E039 Hypothyroidism, unspecified: Secondary | ICD-10-CM

## 2020-06-29 DIAGNOSIS — M8949 Other hypertrophic osteoarthropathy, multiple sites: Secondary | ICD-10-CM

## 2020-06-29 DIAGNOSIS — R350 Frequency of micturition: Secondary | ICD-10-CM | POA: Diagnosis not present

## 2020-06-29 DIAGNOSIS — L8942 Pressure ulcer of contiguous site of back, buttock and hip, stage 2: Secondary | ICD-10-CM | POA: Diagnosis not present

## 2020-06-29 DIAGNOSIS — M150 Primary generalized (osteo)arthritis: Secondary | ICD-10-CM

## 2020-06-29 NOTE — Assessment & Plan Note (Signed)
Hypothyroidism, takes Levothyroxine, TSH 4.34 05/18/20

## 2020-06-29 NOTE — Assessment & Plan Note (Signed)
Left knee OA, takign Mobic 15 mg qd since 05/01/20, X-ray R hip/knee, f/u Ortho 

## 2020-06-29 NOTE — Assessment & Plan Note (Signed)
a slow healing pressure ulcer in the right groin skin fold, size about a quarter, skin missing, no crater, granulated wound bed with serosanguinous drainage, no peri wound infection or inflammation.  Will apply Hydrocolloid dressing q3-5 days and prn, avoid pressure, moist, friction if possible. Observe.

## 2020-06-29 NOTE — Progress Notes (Signed)
Location:   [place] Room Number: [place] of Service:  ALF (13) Provider: [name] NP  Saleah Rishel X, NP  Patient Care Team: Devaeh Amadi X, NP as PCP - General (Internal Medicine) [name], [name], MD as Consulting Physician (Orthopedic Surgery) [name], MD as Consulting Physician (Gastroenterology) System, Provider Not In [name], MD as Consulting Physician (Neurosurgery) [name], MD as Referring Physician (Ophthalmology) Gazella Anglin X, NP as Nurse Practitioner (Internal Medicine) Almedia Balls, MD as Referring Physician (Orthopedic Surgery) [name], MD as Consulting Physician (Neurosurgery) Elsie Saas, MD as Consulting Physician (Orthopedic Surgery) Allyn Kenner, MD (Dermatology) Kathrynn Ducking, MD as Consulting Physician (Neurology) Franchot Gallo, MD as Consulting Physician (Urology)  Extended Emergency Contact Information Primary Emergency Contact: [name] Address: [address]          [place], [place] [address] [place] Phone: [phone] Relation: Spouse  Code Status: DNR Goals of care: Advanced Directive information Advanced Directives 05/10/2020  Does Patient Have a Medical Advance Directive? Yes  Type of Paramedic of [place];Living will;Out of facility DNR (pink MOST or yellow form)  Does patient want to make changes to medical advance directive? No - Patient declined  Copy of [place] in Chart? Yes - validated most recent copy scanned in chart (See row information)  Pre-existing out of facility DNR order (yellow form or pink MOST form) Pink MOST form placed in chart (order not valid for inpatient use)     Chief Complaint  Patient presents with  . Acute Visit    Pressure ulcer in right abd fold    HPI:  Pt is a 86 y.o. male seen today for an acute visit for a slow healing pressure ulcer in the  right groin skin fold, size about a quarter, skin missing, no crater, granulated wound bed with serosanguinous drainage, no peri wound infection or inflammation.   Peripheral neuropathy, weakness in legs, w/c for mobility, on Gabapentin 100mg  qd.              Left knee OA, takign Mobic 15 mg qd since 05/01/20, X-ray R hip/knee, f/u Ortho             Urinary frequency, stable, on Tamsulosin.             Hypothyroidism, takes Levothyroxine, TSH 4.34 05/18/20             Edema BLE, takes Furosemide, Bun/creat 21/1.0 03/22/20             Constipation, takes Senokot S,              GERD, takes Omeprazole 20mg  qd.   Past Medical History:  Diagnosis Date  . Abnormal MRI, knee    Per [facility] New Patient Packet   . Anemia   . Anticoagulated   . Anxiety   . Arthritis   . Cervical disc disorder    Per [place] Patient Packet   . Complication of anesthesia    states "I was in [place] for several weeks after surgery"caused by tramadol  . Constipation   . Dysuria 07/28/2016  . Edema 07/24/2016  . Gait abnormality 07/25/2016  . Hard of hearing   . Head injury    As a result of a fall, Per [facility] New Patient Packet   . Hip arthritis 07/21/2016  . History of  fall 07/25/2016  . Hypercholesteremia   . Hypertension   . Lumbar spinal stenosis   . Memory loss 07/28/2016   mild  . Obesity    Per [facility] New Patient Packet   . Osteoarthritis of right hip 07/24/2016  . Peripheral neuropathy 03/10/2018  . Sleep apnea    cpap  . Spinal stenosis of lumbar region 05/28/2016  . Urinary frequency   . Venous thrombosis of leg 07/24/2016   right gastrocnemius  . Vertigo   . Wears glasses   . Weight loss 04/08/2016   Past Surgical History:  Procedure Laterality Date  . BACK SURGERY  10/17, 80's  . COLONOSCOPY    . COLONOSCOPY     Per [facility] New Patient Packet, [place]   . ESOPHAGOGASTRODUODENOSCOPY    . LUMBAR LAMINECTOMY/DECOMPRESSION MICRODISCECTOMY Right 05/28/2016   Procedure: Right Lumbar Three-Four  Microdiscectomy;  Surgeon: [name], MD;  Location: [place] NEURO ORS;  Service: Neurosurgery;  Laterality: Right;  . TOTAL HIP ARTHROPLASTY Right 07/21/2016   Procedure: TOTAL HIP ARTHROPLASTY ANTERIOR APPROACH;  Surgeon: [name], MD;  Location: [place];  Service: Orthopedics;  Laterality: Right;    Allergies  Allergen Reactions  . Tramadol Other (See Comments)    "does not eat" LOSS OF APPETITE    Allergies as of 06/29/2020      Reactions   Tramadol Other (See Comments)   "does not eat" LOSS OF APPETITE      Medication List       Accurate as of June 29, 2020  3:40 PM. If you have any questions, ask your nurse or doctor.        acetaminophen 500 MG tablet Commonly known as: TYLENOL Take 1,000 mg by mouth every 8 (eight) hours as needed.   aspirin EC 81 MG tablet Take 81 mg by mouth daily. 8am.   atorvastatin 20 MG tablet Commonly known as: LIPITOR Take 20 mg by mouth daily. 8pm.   B-12 1000 MCG Tabs Take 1 tablet by mouth every other day. 8am.   CoQ10 100 MG Caps Take 1 capsule by mouth daily. 8am.   diphenhydrAMINE 25 MG tablet Commonly known as: BENADRYL Take 25 mg by mouth as needed.   fexofenadine 180 MG tablet Commonly known as: ALLEGRA Take 180 mg by mouth daily as needed for allergies or rhinitis.   fluticasone 50 MCG/ACT nasal spray Commonly known as: FLONASE Place 2 sprays into both nostrils as needed.   furosemide 20 MG tablet Commonly known as: LASIX Take 20 mg by mouth. Mon, Wed, Fri   gabapentin 100 MG capsule Commonly known as: NEURONTIN Take 100 mg by mouth at bedtime. 8pm.   Glucosamine 500 MG Caps Take by mouth. Take 3 pills to equal 1500mg  daily.   hydrocortisone 2.5 % cream Apply topically 2 (two) times daily as needed.   ketoconazole 2 % cream Commonly known as: NIZORAL Apply 1 application topically 2 (two) times daily as needed for irritation.   meclizine 25 MG tablet Commonly known as: ANTIVERT Take 25 mg by  mouth 3 (three) times daily as needed for dizziness.   meloxicam 7.5 MG tablet Commonly known as: MOBIC Take 15 mg by mouth daily.   NAT-RUL PSYLLIUM SEED HUSKS PO Take 1 tablet by mouth as needed. For constipation.   omeprazole 20 MG capsule Commonly known as: PRILOSEC Take 20 mg by mouth daily.   sennosides-docusate sodium 8.6-50 MG tablet Commonly known as: SENOKOT-S Take 1 tablet by mouth daily.   sodium  chloride 0.65 % Soln nasal spray Commonly known as: OCEAN Place 2 sprays into both nostrils as needed for congestion.   Synthroid 50 MCG tablet Generic drug: levothyroxine Take 50 mcg by mouth daily before breakfast.   tamsulosin 0.4 MG Caps capsule Commonly known as: FLOMAX Take 0.4 mg by mouth at bedtime.       Review of Systems  Constitutional: Negative for activity change, appetite change and fever.  HENT: Positive for hearing loss. Negative for congestion and voice change.   Eyes: Negative for visual disturbance.  Respiratory: Negative for cough and shortness of breath.   Cardiovascular: Positive for leg swelling.  Gastrointestinal: Negative for abdominal pain and constipation.  Genitourinary: Positive for frequency. Negative for difficulty urinating and urgency.  Musculoskeletal: Positive for arthralgias, back pain and gait problem.       R+L knees, left shoulder, s/p cervical spine+lumber spien surgeries. Mostly pain is in the right knee presently.   Skin: Positive for wound. Negative for color change.  Neurological: Positive for weakness and numbness. Negative for speech difficulty.       Memory lapses. Tingling, numbness, weakness in legs.  Psychiatric/Behavioral: Negative for behavioral problems, dysphoric mood and sleep disturbance.    Immunization History  Administered Date(s) Administered  . Influenza-Unspecified 06/11/2015, 06/11/2019  . Moderna SARS-COVID-2 Vaccination 09/10/2019, 10/08/2019  . PFIZER SARS-COV-2 Vaccination 09/10/2019  .  Pneumococcal Polysaccharide-23 04/21/2011  . Pneumococcal-Unspecified 04/21/2019  . Td 04/22/2002  . Zoster 04/27/2012   Pertinent  Health Maintenance Due  Topic Date Due  . INFLUENZA VACCINE  04/08/2020  . PNA vac Low Risk Adult (2 of 2 - PCV13) 04/20/2020   Fall Risk  03/24/2019 02/08/2018 07/28/2016  Falls in the past year? 0 Yes Yes  Number falls in past yr: 0 1 1  Injury with Fall? 0 No No  Risk for fall due to : - - History of fall(s);Impaired balance/gait;Impaired mobility  Follow up - - Falls evaluation completed   Functional Status Survey:    Vitals:   06/29/20 1438  BP: 130/76  Pulse: 78  Resp: 20  Temp: 97.9 F (36.6 C)  SpO2: 96%  Weight: 210 lb 9.6 oz (95.5 kg)  Height: 5\' 4"  (1.626 m)   Body mass index is 36.15 kg/m. Physical Exam Vitals and nursing note reviewed.  Constitutional:      Appearance: Normal appearance.  HENT:     Head: Normocephalic and atraumatic.     Mouth/Throat:     Mouth: Mucous membranes are moist.  Eyes:     Extraocular Movements: Extraocular movements intact.     Conjunctiva/sclera: Conjunctivae normal.     Pupils: Pupils are equal, round, and reactive to light.  Cardiovascular:     Rate and Rhythm: Normal rate and regular rhythm.     Heart sounds: No murmur heard.   Pulmonary:     Effort: Pulmonary effort is normal.     Breath sounds: No rales.  Abdominal:     General: Bowel sounds are normal.     Palpations: Abdomen is soft.     Tenderness: There is no abdominal tenderness.  Musculoskeletal:     Cervical back: Normal range of motion and neck supple.     Right lower leg: Edema present.     Left lower leg: Edema present.     Comments: trace edema BLE. Left shoulder pain with ROM is chronic. Chronic R+L knee pain-mostly in the left knee, knee support L knee. S/p R hips replacement. S/p cervical/lumbar spine  surgeries.   Skin:    General: Skin is warm and dry.     Comments: a slow healing pressure ulcer in the right groin  skin fold, size about a quarter, skin missing, no crater, granulated wound bed with serosanguinous drainage, no peri wound infection or inflammation.    Neurological:     General: No focal deficit present.     Mental Status: He is alert. Mental status is at baseline.     Motor: Weakness present.     Gait: Gait abnormal.     Comments: Moves slow, uses electric w/c for mobility,  lower body weakness, peripheral neuropathy in legs.   Psychiatric:        Mood and Affect: Mood normal.        Behavior: Behavior normal.        Thought Content: Thought content normal.     Comments: Oriented to person, place.      Labs reviewed: Recent Labs    08/10/19 0000 02/24/20 0000 03/22/20 0000  NA 140 141 139  K 4.1 4.0 4.3  CL 104 105 104  CO2 28* 29* 29*  BUN 17 20 21   CREATININE 0.9 0.9 1.0  CALCIUM  --  9.0 8.6*   Recent Labs    08/10/19 0000 02/24/20 0000  AST 15 17  ALT 9* 15  ALKPHOS 79 88  ALBUMIN  --  4.2   Recent Labs    08/10/19 0000 02/24/20 0000  WBC 4.9 5.3  NEUTROABS 2,773 3,222  HGB 14.2 13.8  HCT 43 42  PLT 210 171   Lab Results  Component Value Date   TSH 4.34 05/18/2020   No results found for: HGBA1C Lab Results  Component Value Date   CHOL 139 02/24/2020   HDL 37 02/24/2020   LDLCALC 82 02/24/2020   TRIG 100 02/24/2020   CHOLHDL 5.1 (H) 03/31/2019    Significant Diagnostic Results in last 30 days:  No results found.  Assessment/Plan: Pressure ulcer, stage II ([place]) a slow healing pressure ulcer in the right groin skin fold, size about a quarter, skin missing, no crater, granulated wound bed with serosanguinous drainage, no peri wound infection or inflammation.  Will apply Hydrocolloid dressing q3-5 days and prn, avoid pressure, moist, friction if possible. Observe.   Peripheral neuropathy Peripheral neuropathy, weakness in legs, w/c for mobility, on Gabapentin 100mg  qd.    Osteoarthritis, multiple sites Left knee OA, takign Mobic 15 mg qd  since 05/01/20, X-ray R hip/knee, f/u Ortho   Urinary frequency Urinary frequency, stable, on Tamsulosin.  Hypothyroidism  Hypothyroidism, takes Levothyroxine, TSH 4.34 05/18/20   Edema Edema BLE, takes Furosemide, Bun/creat 21/1.0 03/22/20  Constipation  Constipation, takes Senokot S,   GERD (gastroesophageal reflux disease) GERD, takes Omeprazole 20mg  qd.     Family/ staff Communication: plan of care reviewed with the patient and charge nurse.   Labs/tests ordered:  none  Time spend 40 minutes.

## 2020-06-29 NOTE — Assessment & Plan Note (Signed)
Constipation, takes Senokot S,

## 2020-06-29 NOTE — Assessment & Plan Note (Signed)
Urinary frequency, stable, on Tamsulosin.

## 2020-06-29 NOTE — Assessment & Plan Note (Signed)
Peripheral neuropathy, weakness in legs, w/c for mobility, on Gabapentin 100mg  qd.

## 2020-06-29 NOTE — Assessment & Plan Note (Signed)
GERD, takes Omeprazole 20mg  qd.

## 2020-06-29 NOTE — Assessment & Plan Note (Signed)
Edema BLE, takes Furosemide, Bun/creat 21/1.0 03/22/20

## 2020-07-04 DIAGNOSIS — M6281 Muscle weakness (generalized): Secondary | ICD-10-CM | POA: Diagnosis not present

## 2020-07-04 DIAGNOSIS — M25551 Pain in right hip: Secondary | ICD-10-CM | POA: Diagnosis not present

## 2020-07-04 DIAGNOSIS — M25561 Pain in right knee: Secondary | ICD-10-CM | POA: Diagnosis not present

## 2020-07-04 DIAGNOSIS — M25562 Pain in left knee: Secondary | ICD-10-CM | POA: Diagnosis not present

## 2020-07-04 DIAGNOSIS — M158 Other polyosteoarthritis: Secondary | ICD-10-CM | POA: Diagnosis not present

## 2020-07-04 DIAGNOSIS — R2681 Unsteadiness on feet: Secondary | ICD-10-CM | POA: Diagnosis not present

## 2020-07-04 DIAGNOSIS — Z9181 History of falling: Secondary | ICD-10-CM | POA: Diagnosis not present

## 2020-07-11 ENCOUNTER — Encounter: Payer: Self-pay | Admitting: Nurse Practitioner

## 2020-07-11 NOTE — Progress Notes (Signed)
Location:   [place] Room Number: [place] of Service:  ALF [phone]) Provider:  Mast, Man X, NP  Mast, Man X, NP  Patient Care Team: Mast, Man X, NP as PCP - General (Internal Medicine) [name], [name], MD as Consulting Physician (Orthopedic Surgery) Travis Corner, MD as Consulting Physician (Gastroenterology) System, Provider Not In Travis Kos, MD as Consulting Physician (Neurosurgery) Travis Halsted, MD as Referring Physician (Ophthalmology) Mast, Man X, NP as Nurse Practitioner (Internal Medicine) Travis Balls, MD as Referring Physician (Orthopedic Surgery) Travis Kos, MD as Consulting Physician (Neurosurgery) Travis Saas, MD as Consulting Physician (Orthopedic Surgery) Allyn Kenner, MD (Dermatology) Kathrynn Ducking, MD as Consulting Physician (Neurology) Franchot Gallo, MD as Consulting Physician (Urology)  Extended Emergency Contact Information Primary Emergency Contact: [name] Address: [address]          [place], [place] [address] [place] Phone: [phone] Relation: Spouse  Code Status: DNR Goals of care: Advanced Directive information Advanced Directives 07/11/2020  Does Patient Have a Medical Advance Directive? Yes  Type of Paramedic of [place];Living will;Out of facility DNR (pink MOST or yellow form)  Does patient want to make changes to medical advance directive? No - Patient declined  Copy of [place] in Chart? Yes - validated most recent copy scanned in chart (See row information)  Pre-existing out of facility DNR order (yellow form or pink MOST form) Pink MOST form placed in chart (order not valid for inpatient use)     Chief Complaint  Patient presents with  . Medical Management of Chronic Issues    Routine follow up visit.  [name] Best Practice Recommendations    Tetanus/Tdap, Pneumonia, Flu vaccine    HPI:    Pt is a 86 y.o. male seen today for medical management of chronic diseases.     Past Medical History:  Diagnosis Date  . Abnormal MRI, knee    Per [facility] New Patient Packet   . Anemia   . Anticoagulated   . Anxiety   . Arthritis   . Cervical disc disorder    Per [facility] Patient Packet   . Complication of anesthesia    states "I was in [place] for several weeks after surgery"caused by tramadol  . Constipation   . Dysuria 07/28/2016  . Edema 07/24/2016  . Gait abnormality 07/25/2016  . Hard of hearing   . Head injury    As a result of a fall, Per [facility] New Patient Packet   . Hip arthritis 07/21/2016  . History of fall 07/25/2016  . Hypercholesteremia   . Hypertension   . Lumbar spinal stenosis   . Memory loss 07/28/2016   mild  . Obesity    Per [facility] New Patient Packet   . Osteoarthritis of right hip 07/24/2016  . Peripheral neuropathy 03/10/2018  . Sleep apnea    cpap  . Spinal stenosis of lumbar region 05/28/2016  . Urinary frequency   . Venous thrombosis of leg 07/24/2016   right gastrocnemius  . Vertigo   . Wears glasses   . Weight loss 04/08/2016   Past Surgical History:  Procedure Laterality Date  . BACK SURGERY  10/17, 80's  . COLONOSCOPY    . COLONOSCOPY     Per [facility] New Patient Packet, [name]   . ESOPHAGOGASTRODUODENOSCOPY    . LUMBAR LAMINECTOMY/DECOMPRESSION MICRODISCECTOMY Right 05/28/2016  Procedure: Right Lumbar Three-Four Microdiscectomy;  Surgeon: Travis Kos, MD;  Location: [place] NEURO ORS;  Service: Neurosurgery;  Laterality: Right;  . TOTAL HIP ARTHROPLASTY Right 07/21/2016   Procedure: TOTAL HIP ARTHROPLASTY ANTERIOR APPROACH;  Surgeon: Travis Pel, MD;  Location: [place];  Service: Orthopedics;  Laterality: Right;    Allergies  Allergen Reactions  . Tramadol Other (See Comments)    "does not eat" LOSS OF APPETITE    Allergies as of 07/11/2020      Reactions   Tramadol Other (See Comments)   "does not eat" LOSS OF APPETITE       Medication List       Accurate as of July 11, 2020  9:34 AM. If you have any questions, ask your nurse or doctor.        acetaminophen 500 MG tablet Commonly known as: TYLENOL Take 1,000 mg by mouth every 8 (eight) hours as needed.   aspirin EC 81 MG tablet Take 81 mg by mouth daily. 8am.   atorvastatin 20 MG tablet Commonly known as: LIPITOR Take 20 mg by mouth daily. 8pm.   B-12 1000 MCG Tabs Take 1 tablet by mouth every other day. 8am.   CoQ10 100 MG Caps Take 1 capsule by mouth daily. 8am.   diphenhydrAMINE 25 MG tablet Commonly known as: BENADRYL Take 25 mg by mouth as needed.   fexofenadine 180 MG tablet Commonly known as: ALLEGRA Take 180 mg by mouth daily as needed for allergies or rhinitis.   fluticasone 50 MCG/ACT nasal spray Commonly known as: FLONASE Place 2 sprays into both nostrils as needed.   furosemide 20 MG tablet Commonly known as: LASIX Take 20 mg by mouth. Mon, Wed, Fri   gabapentin 100 MG capsule Commonly known as: NEURONTIN Take 100 mg by mouth at bedtime. 8pm.   Glucosamine 500 MG Caps Take by mouth. Take 3 pills to equal 1500mg  daily.   hydrocortisone 2.5 % cream Apply topically 2 (two) times daily as needed.   ketoconazole 2 % cream Commonly known as: NIZORAL Apply 1 application topically 2 (two) times daily as needed for irritation.   meclizine 25 MG tablet Commonly known as: ANTIVERT Take 25 mg by mouth 3 (three) times daily as needed for dizziness.   meloxicam 15 MG tablet Commonly known as: MOBIC Take 15 mg by mouth daily.   NAT-RUL PSYLLIUM SEED HUSKS PO Take 1 tablet by mouth as needed. For constipation.   omeprazole 20 MG capsule Commonly known as: PRILOSEC Take 20 mg by mouth daily.   sennosides-docusate sodium 8.6-50 MG tablet Commonly known as: SENOKOT-S Take 1 tablet by mouth daily.   sodium chloride 0.65 % Soln nasal spray Commonly known as: OCEAN Place 2 sprays into both nostrils as needed for  congestion.   Synthroid 50 MCG tablet Generic drug: levothyroxine Take 50 mcg by mouth daily before breakfast.   tamsulosin 0.4 MG Caps capsule Commonly known as: FLOMAX Take 0.4 mg by mouth at bedtime.       Review of Systems  Immunization History  Administered Date(s) Administered  . Influenza-Unspecified 06/11/2015, 06/11/2019, 06/19/2020  . Moderna SARS-COVID-2 Vaccination 09/10/2019, 10/08/2019  . PFIZER SARS-COV-2 Vaccination 09/10/2019  . Pneumococcal Polysaccharide-23 04/21/2011  . Pneumococcal-Unspecified 04/21/2019  . Td 04/22/2002  . Tetanus 05/16/2020  . Zoster 04/27/2012   Pertinent  Health Maintenance Due  Topic Date Due  . PNA vac Low Risk Adult (2 of 2 - PCV13) 04/20/2020  . INFLUENZA VACCINE  Completed  Fall Risk  03/24/2019 02/08/2018 07/28/2016  Falls in the past year? 0 Yes Yes  Number falls in past yr: 0 1 1  Injury with Fall? 0 No No  Risk for fall due to : - - History of fall(s);Impaired balance/gait;Impaired mobility  Follow up - - Falls evaluation completed   Functional Status Survey:    Vitals:   07/11/20 0922  BP: 120/60  Pulse: (!) 58  Resp: 18  Temp: 97.6 F (36.4 C)  SpO2: 95%  Weight: 213 lb 3.2 oz (96.7 kg)  Height: 5\' 4"  (1.626 m)   Body mass index is 36.6 kg/m. Physical Exam  Labs reviewed: Recent Labs    08/10/19 0000 02/24/20 0000 03/22/20 0000  NA 140 141 139  K 4.1 4.0 4.3  CL 104 105 104  CO2 28* 29* 29*  BUN 17 20 21   CREATININE 0.9 0.9 1.0  CALCIUM  --  9.0 8.6*   Recent Labs    08/10/19 0000 02/24/20 0000  AST 15 17  ALT 9* 15  ALKPHOS 79 88  ALBUMIN  --  4.2   Recent Labs    08/10/19 0000 02/24/20 0000  WBC 4.9 5.3  NEUTROABS 2,773 3,222  HGB 14.2 13.8  HCT 43 42  PLT 210 171   Lab Results  Component Value Date   TSH 4.34 05/18/2020   No results found for: HGBA1C Lab Results  Component Value Date   CHOL 139 02/24/2020   HDL 37 02/24/2020   LDLCALC 82 02/24/2020   TRIG 100  02/24/2020   CHOLHDL 5.1 (H) 03/31/2019    Significant Diagnostic Results in last 30 days:  No results found.  Assessment/Plan There are no diagnoses linked to this encounter.   Family/ staff Communication:   Labs/tests ordered:     This encounter was created in error - please disregard.

## 2020-07-13 ENCOUNTER — Non-Acute Institutional Stay: Payer: Medicare PPO | Admitting: Internal Medicine

## 2020-07-13 ENCOUNTER — Encounter: Payer: Self-pay | Admitting: Internal Medicine

## 2020-07-13 DIAGNOSIS — L8942 Pressure ulcer of contiguous site of back, buttock and hip, stage 2: Secondary | ICD-10-CM | POA: Diagnosis not present

## 2020-07-13 NOTE — Progress Notes (Signed)
Location:   [place] Room Number: [place]:  ALF [phone]) Provider:  Veleta Miners MD  Mast, Man X, NP  Patient Care Team: Mast, Man X, NP as PCP - General (Internal Medicine) [name], [name], MD as Consulting Physician (Orthopedic Surgery) [name], MD as Consulting Physician (Gastroenterology) System, Provider Not In [name], MD as Consulting Physician (Neurosurgery) [name], MD as Referring Physician (Ophthalmology) Mast, Man X, NP as Nurse Practitioner (Internal Medicine) Almedia Balls, MD as Referring Physician (Orthopedic Surgery) [name], MD as Consulting Physician (Neurosurgery) Elsie Saas, MD as Consulting Physician (Orthopedic Surgery) Allyn Kenner, MD (Dermatology) Kathrynn Ducking, MD as Consulting Physician (Neurology) Franchot Gallo, MD as Consulting Physician (Urology)  Extended Emergency Contact Information Primary Emergency Contact: [name] Address: [address]          [place], [place] [address] [place] Phone: [phone] Relation: Spouse  Code Status:  Full Code Goals of care: Advanced Directive information Advanced Directives 07/11/2020  Does Patient Have a Medical Advance Directive? Yes  Type of Paramedic of [place];Living will;Out of facility DNR (pink MOST or yellow form)  Does patient want to make changes to medical advance directive? No - Patient declined  Copy of [place] in Chart? Yes - validated most recent copy scanned in chart (See row information)  Pre-existing out of facility DNR order (yellow form or pink MOST form) Pink MOST form placed in chart (order not valid for inpatient use)     Chief Complaint  Patient presents with  . Acute Visit    HPI:  Pt is a 86 y.o. male seen today for an acute visit for Follow up Pressure wound in Groin area Concern of  Infection  Patient has h/o Peripheral Neuropathy Idiopathic, BPH with urinary Frequency, LE edema,  Arthritis, Sleep Apnea, Hyperlipidemia  Has developed Pressure Ulcer in his groin area. On Hydrocolloid dressing Nurses wanted him evaluated as they had noticed discharge and odor when changing dressing No Pain or redness no  Fever   Past Medical History:  Diagnosis Date  . Abnormal MRI, knee    Per [facility] New Patient Packet   . Anemia   . Anticoagulated   . Anxiety   . Arthritis   . Cervical disc disorder    Per [place] Patient Packet   . Complication of anesthesia    states "I was in [place] for several weeks after surgery"caused by tramadol  . Constipation   . Dysuria 07/28/2016  . Edema 07/24/2016  . Gait abnormality 07/25/2016  . Hard of hearing   . Head injury    As a result of a fall, Per [facility] New Patient Packet   . Hip arthritis 07/21/2016  . History of fall 07/25/2016  . Hypercholesteremia   . Hypertension   . Lumbar spinal stenosis   . Memory loss 07/28/2016   mild  . Obesity    Per [facility] New Patient Packet   . Osteoarthritis of right hip 07/24/2016  . Peripheral neuropathy 03/10/2018  . Sleep apnea    cpap  . Spinal stenosis of lumbar region 05/28/2016  . Urinary frequency   . Venous thrombosis of leg 07/24/2016   right gastrocnemius  . Vertigo   . Wears glasses   . Weight loss 04/08/2016   Past Surgical History:  Procedure Laterality Date  . BACK  SURGERY  10/17, 80's  . COLONOSCOPY    . COLONOSCOPY     Per [facility] New Patient Packet, [place]   . ESOPHAGOGASTRODUODENOSCOPY    . LUMBAR LAMINECTOMY/DECOMPRESSION MICRODISCECTOMY Right 05/28/2016   Procedure: Right Lumbar Three-Four Microdiscectomy;  Surgeon: [name], MD;  Location: [place] NEURO ORS;  Service: Neurosurgery;  Laterality: Right;  . TOTAL HIP ARTHROPLASTY Right 07/21/2016   Procedure: TOTAL HIP ARTHROPLASTY ANTERIOR APPROACH;  Surgeon: [name], MD;  Location: [place];  Service:  Orthopedics;  Laterality: Right;    Allergies  Allergen Reactions  . Tramadol Other (See Comments)    "does not eat" LOSS OF APPETITE    Allergies as of 07/13/2020      Reactions   Tramadol Other (See Comments)   "does not eat" LOSS OF APPETITE      Medication List       Accurate as of July 13, 2020  4:17 PM. If you have any questions, ask your nurse or doctor.        acetaminophen 500 MG tablet Commonly known as: TYLENOL Take 1,000 mg by mouth every 8 (eight) hours as needed.   aspirin EC 81 MG tablet Take 81 mg by mouth daily. 8am.   atorvastatin 20 MG tablet Commonly known as: LIPITOR Take 20 mg by mouth daily. 8pm.   B-12 1000 MCG Tabs Take 1 tablet by mouth every other day. 8am.   CoQ10 100 MG Caps Take 1 capsule by mouth daily. 8am.   diphenhydrAMINE 25 MG tablet Commonly known as: BENADRYL Take 25 mg by mouth as needed.   fexofenadine 180 MG tablet Commonly known as: ALLEGRA Take 180 mg by mouth daily as needed for allergies or rhinitis.   fluticasone 50 MCG/ACT nasal spray Commonly known as: FLONASE Place 2 sprays into both nostrils as needed.   furosemide 20 MG tablet Commonly known as: LASIX Take 20 mg by mouth. Mon, Wed, Fri   gabapentin 100 MG capsule Commonly known as: NEURONTIN Take 100 mg by mouth at bedtime. 8pm.   Glucosamine 500 MG Caps Take by mouth. Take 3 pills to equal 1500mg  daily.   hydrocortisone 2.5 % cream Apply topically 2 (two) times daily as needed.   ketoconazole 2 % cream Commonly known as: NIZORAL Apply 1 application topically 2 (two) times daily as needed for irritation.   meclizine 25 MG tablet Commonly known as: ANTIVERT Take 25 mg by mouth 3 (three) times daily as needed for dizziness.   meloxicam 15 MG tablet Commonly known as: MOBIC Take 15 mg by mouth daily.   NAT-RUL PSYLLIUM SEED HUSKS PO Take 1 tablet by mouth as needed. For constipation.   omeprazole 20 MG capsule Commonly known as:  PRILOSEC Take 20 mg by mouth daily.   sennosides-docusate sodium 8.6-50 MG tablet Commonly known as: SENOKOT-S Take 1 tablet by mouth daily.   sodium chloride 0.65 % Soln nasal spray Commonly known as: OCEAN Place 2 sprays into both nostrils as needed for congestion.   Synthroid 50 MCG tablet Generic drug: levothyroxine Take 50 mcg by mouth daily before breakfast.   tamsulosin 0.4 MG Caps capsule Commonly known as: FLOMAX Take 0.4 mg by mouth at bedtime.       Review of Systems  Constitutional: Negative.   HENT: Negative.   Respiratory: Negative.   Cardiovascular: Positive for leg swelling.  Gastrointestinal: Negative.   Genitourinary: Positive for frequency.  Musculoskeletal: Positive for arthralgias and myalgias.  Skin: Positive for wound.  Neurological: Negative.  Psychiatric/Behavioral: Negative.     Immunization History  Administered Date(s) Administered  . Influenza-Unspecified 06/11/2015, 06/11/2019, 06/19/2020  . Moderna SARS-COVID-2 Vaccination 09/10/2019, 10/08/2019  . PFIZER SARS-COV-2 Vaccination 09/10/2019  . Pneumococcal Polysaccharide-23 04/21/2011  . Pneumococcal-Unspecified 04/21/2019  . Td 04/22/2002  . Tetanus 05/16/2020  . Zoster 04/27/2012   Pertinent  Health Maintenance Due  Topic Date Due  . PNA vac Low Risk Adult (2 of 2 - PCV13) 04/20/2020  . INFLUENZA VACCINE  Completed   Fall Risk  03/24/2019 02/08/2018 07/28/2016  Falls in the past year? 0 Yes Yes  Number falls in past yr: 0 1 1  Injury with Fall? 0 No No  Risk for fall due to : - - History of fall(s);Impaired balance/gait;Impaired mobility  Follow up - - Falls evaluation completed   Functional Status Survey:    Vitals:   07/13/20 1613  BP: 120/60  Pulse: (!) 58  Resp: 18  Temp: 97.8 F (36.6 C)  SpO2: 95%  Weight: 213 lb 3.2 oz (96.7 kg)  Height: 5\' 4"  (1.626 m)   Body mass index is 36.6 kg/m. Physical Exam Vitals reviewed.  Constitutional:      Appearance: Normal  appearance.  HENT:     Head: Normocephalic.     Nose: Nose normal.     Mouth/Throat:     Mouth: Mucous membranes are moist.     Pharynx: Oropharynx is clear.  Eyes:     Pupils: Pupils are equal, round, and reactive to light.  Cardiovascular:     Rate and Rhythm: Normal rate and regular rhythm.     Pulses: Normal pulses.     Heart sounds: Normal heart sounds.  Pulmonary:     Effort: Pulmonary effort is normal.     Breath sounds: Normal breath sounds.  Abdominal:     General: Abdomen is flat. Bowel sounds are normal.     Palpations: Abdomen is soft.  Musculoskeletal:        General: Swelling present.     Cervical back: Neck supple.  Skin:    Comments: Stage 2 -3  Wound in Groin area. It is 3x4 cm  Clean with health Margins. No Odor. No Signs of infection  Neurological:     Mental Status: He is alert and oriented to person, place, and time.  Psychiatric:        Mood and Affect: Mood normal.        Thought Content: Thought content normal.     Labs reviewed: Recent Labs    08/10/19 0000 02/24/20 0000 03/22/20 0000  NA 140 141 139  K 4.1 4.0 4.3  CL 104 105 104  CO2 28* 29* 29*  BUN 17 20 21   CREATININE 0.9 0.9 1.0  CALCIUM  --  9.0 8.6*   Recent Labs    08/10/19 0000 02/24/20 0000  AST 15 17  ALT 9* 15  ALKPHOS 79 88  ALBUMIN  --  4.2   Recent Labs    08/10/19 0000 02/24/20 0000  WBC 4.9 5.3  NEUTROABS 2,773 3,222  HGB 14.2 13.8  HCT 43 42  PLT 210 171   Lab Results  Component Value Date   TSH 4.34 05/18/2020   No results found for: HGBA1C Lab Results  Component Value Date   CHOL 139 02/24/2020   HDL 37 02/24/2020   LDLCALC 82 02/24/2020   TRIG 100 02/24/2020   CHOLHDL 5.1 (H) 03/31/2019    Significant Diagnostic Results in last 30 days:  No results found.  Assessment/Plan Pressure injury of contiguous region involving right buttock and hip, stage 2 (HCC) Continue Hydrocolloid dressing QOD. With Skin prep No Signs of infection yet If  discharge worsen will consider Sliver Alginate again  Other issues  Primary osteoarthritis involving multiple joints On Meloxicam. Refuses to stop Has Appointment with Dr Philipp Ovens Idiopathic peripheral neuropathy On Neurontin Uses Power Chair Hypothyroidism, unspecified type TSH normal Bilateral leg edema Doing well on Lasix Hyperlipidemia, unspecified hyperlipidemia type On Statin LDL less then 100 Urinary Frequency On Flomax  Family/ staff Communication:   Labs/tests ordered:

## 2020-08-09 ENCOUNTER — Non-Acute Institutional Stay: Payer: Medicare PPO | Admitting: Nurse Practitioner

## 2020-08-09 ENCOUNTER — Encounter: Payer: Self-pay | Admitting: Nurse Practitioner

## 2020-08-09 DIAGNOSIS — R609 Edema, unspecified: Secondary | ICD-10-CM

## 2020-08-09 DIAGNOSIS — R350 Frequency of micturition: Secondary | ICD-10-CM | POA: Diagnosis not present

## 2020-08-09 DIAGNOSIS — F419 Anxiety disorder, unspecified: Secondary | ICD-10-CM | POA: Insufficient documentation

## 2020-08-09 DIAGNOSIS — E039 Hypothyroidism, unspecified: Secondary | ICD-10-CM

## 2020-08-09 DIAGNOSIS — K219 Gastro-esophageal reflux disease without esophagitis: Secondary | ICD-10-CM | POA: Diagnosis not present

## 2020-08-09 DIAGNOSIS — K5903 Drug induced constipation: Secondary | ICD-10-CM | POA: Diagnosis not present

## 2020-08-09 DIAGNOSIS — G609 Hereditary and idiopathic neuropathy, unspecified: Secondary | ICD-10-CM | POA: Diagnosis not present

## 2020-08-09 DIAGNOSIS — M159 Polyosteoarthritis, unspecified: Secondary | ICD-10-CM

## 2020-08-09 DIAGNOSIS — M8949 Other hypertrophic osteoarthropathy, multiple sites: Secondary | ICD-10-CM

## 2020-08-09 NOTE — Assessment & Plan Note (Signed)
GERD, takes Omeprazole 20mg  qd.

## 2020-08-09 NOTE — Assessment & Plan Note (Signed)
Constipation, takes Senokot S,

## 2020-08-09 NOTE — Assessment & Plan Note (Signed)
Peripheral neuropathy, weakness in legs, w/c for mobility, on Gabapentin 100mg  qd.

## 2020-08-09 NOTE — Progress Notes (Signed)
Location:   [place] Room Number: [place] of Service:  ALF (13) Provider:  [name] NP  Nancylee Gaines X, NP  Patient Care Team: Tejuan Gholson X, NP as PCP - General (Internal Medicine) [name], [name], MD as Consulting Physician (Orthopedic Surgery) [name], MD as Consulting Physician (Gastroenterology) System, Provider Not In [name], MD as Consulting Physician (Neurosurgery) [name], MD as Referring Physician (Ophthalmology) Beaux Wedemeyer X, NP as Nurse Practitioner (Internal Medicine) Almedia Balls, MD as Referring Physician (Orthopedic Surgery) [name], MD as Consulting Physician (Neurosurgery) Elsie Saas, MD as Consulting Physician (Orthopedic Surgery) Allyn Kenner, MD (Dermatology) Kathrynn Ducking, MD as Consulting Physician (Neurology) Franchot Gallo, MD as Consulting Physician (Urology)  Extended Emergency Contact Information Primary Emergency Contact: [name] Address: [address]          [place], [place] [address] [place] Phone: [phone] Relation: Spouse  Code Status:  Full Code Goals of care: Advanced Directive information Advanced Directives 07/11/2020  Does Patient Have a Medical Advance Directive? Yes  Type of Paramedic of [place];Living will;Out of facility DNR (pink MOST or yellow form)  Does patient want to make changes to medical advance directive? No - Patient declined  Copy of [place] in Chart? Yes - validated most recent copy scanned in chart (See row information)  Pre-existing out of facility DNR order (yellow form or pink MOST form) Pink MOST form placed in chart (order not valid for inpatient use)     Chief Complaint  Patient presents with  . Acute Visit    Anxiety    HPI:  Pt is a 86 y.o. male seen today for an acute visit for anxiety, underwent psych consultation.   Peripheral  neuropathy, weakness in legs, w/c for mobility, on Gabapentin 134m qd.  Left knee OA, takign Mobic 15 mg qd since 05/01/20, X-ray R hip/knee, f/u Ortho Urinary frequency, stable, on Tamsulosin. Hypothyroidism, takes Levothyroxine, TSH 4.34 05/18/20 Edema BLE, takes Furosemide, Bun/creat 21/1.0 03/22/20 Constipation, takes Senokot S,  GERD, takes Omeprazole 223mqd.   Past Medical History:  Diagnosis Date  . Abnormal MRI, knee    [facility]   . Anemia   . Anticoagulated   . Anxiety   . Arthritis   . Cervical disc disorder    Per [facility]   . Complication of anesthesia    states "I was in [place] several weeks after surgery"caused by tramadol  . Constipation   . Dysuria 07/28/2016  . Edema 07/24/2016  . Gait abnormality 07/25/2016  . Hard of hearing   . Head injury    As a result of a fall, [facility]   . Hip arthritis 07/21/2016  . History of fall 07/25/2016  . Hypercholesteremia   . Hypertension   . Lumbar spinal stenosis   . Memory loss 07/28/2016   mild  . Obesity    [facility]   . Osteoarthritis of right hip 07/24/2016  . Peripheral neuropathy 03/10/2018  . Sleep apnea    cpap  . Spinal stenosis of lumbar region 05/28/2016  . Urinary frequency   . Venous thrombosis of leg 07/24/2016   right gastrocnemius  . Vertigo   . Wears glasses   . Weight loss 04/08/2016   Past Surgical History:  Procedure  Laterality Date  . BACK SURGERY  10/17, 80's  . COLONOSCOPY    . COLONOSCOPY     Per [facility] New Patient Packet, [place]   . ESOPHAGOGASTRODUODENOSCOPY    . LUMBAR LAMINECTOMY/DECOMPRESSION MICRODISCECTOMY Right 05/28/2016   Procedure: Right Lumbar Three-Four Microdiscectomy;  Surgeon: [name], MD;  Location: [place] [facility] OR[facility];  Service: Neurosurgery;  Laterality: Right;  . TOTAL HIP ARTHROPLASTY Right 07/21/2016   Procedure: TOTAL  HIP ARTHROPLASTY ANTERIOR APPROACH;  Surgeon: [name], MD;  Location: [facility];  Service: Orthopedics;  Laterality: Right;    Allergies  Allergen Reactions  . Tramadol Other (See Comments)    "does not eat" LOSS OF APPETITE    Allergies as of 08/09/2020      Reactions   Tramadol Other (See Comments)   "does not eat" LOSS OF APPETITE      Medication List       Accurate as of August 09, 2020 11:59 PM. If you have any questions, ask your nurse or doctor.        acetaminophen 500 MG tablet Commonly known as: TYLENOL Take 1,000 mg by mouth every 8 (eight) hours as needed.   amoxicillin 500 MG tablet Commonly known as: AMOXIL Take 500 mg by mouth in the morning, at noon, and at bedtime.   aspirin EC 81 MG tablet Take 81 mg by mouth daily. 8am.   atorvastatin 20 MG tablet Commonly known as: LIPITOR Take 20 mg by mouth daily. 8pm.   B-12 1000 MCG Tabs Take 1 tablet by mouth every other day. 8am.   CoQ10 100 MG Caps Take 1 capsule by mouth daily. 8am.   diphenhydrAMINE 25 MG tablet Commonly known as: BENADRYL Take 25 mg by mouth as needed.   fexofenadine 180 MG tablet Commonly known as: ALLEGRA Take 180 mg by mouth daily as needed for allergies or rhinitis.   fluticasone 50 MCG/ACT nasal spray Commonly known as: FLONASE Place 2 sprays into both nostrils as needed.   furosemide 20 MG tablet Commonly known as: LASIX Take 20 mg by mouth. Mon, Wed, Fri   gabapentin 100 MG capsule Commonly known as: NEURONTIN Take 100 mg by mouth at bedtime. 8pm.   Glucosamine 500 MG Caps Take by mouth. Take 3 pills to equal 1532m daily.   hydrocortisone 2.5 % cream Apply topically 2 (two) times daily as needed.   ketoconazole 2 % cream Commonly known as: NIZORAL Apply 1 application topically 2 (two) times daily as needed for irritation.   meclizine 25 MG tablet Commonly known as: ANTIVERT Take 25 mg by mouth 3 (three) times daily as needed for dizziness.    meloxicam 15 MG tablet Commonly known as: MOBIC Take 15 mg by mouth daily.   NAT-RUL PSYLLIUM SEED HUSKS PO Take 1 tablet by mouth as needed. For constipation.   omeprazole 20 MG capsule Commonly known as: PRILOSEC Take 20 mg by mouth daily.   sennosides-docusate sodium 8.6-50 MG tablet Commonly known as: SENOKOT-S Take 1 tablet by mouth daily.   sodium chloride 0.65 % Soln nasal spray Commonly known as: OCEAN Place 2 sprays into both nostrils as needed for congestion.   Synthroid 50 MCG tablet Generic drug: levothyroxine Take 50 mcg by mouth daily before breakfast.   tamsulosin 0.4 MG Caps capsule Commonly known as: FLOMAX Take 0.4 mg by mouth at bedtime.       Review of Systems  Constitutional: Negative for activity change, fever and unexpected weight change.  HENT: Positive for  hearing loss. Negative for congestion and voice change.   Eyes: Negative for visual disturbance.  Respiratory: Negative for cough and shortness of breath.   Cardiovascular: Positive for leg swelling.  Gastrointestinal: Negative for abdominal pain and constipation.  Genitourinary: Positive for frequency. Negative for difficulty urinating and urgency.  Musculoskeletal: Positive for arthralgias, back pain and gait problem.       R+L knees, left shoulder, s/p cervical spine+lumber spien surgeries. Mostly pain is in the right knee presently.   Skin: Positive for wound. Negative for color change.  Neurological: Positive for weakness and numbness. Negative for speech difficulty.       Memory lapses. Tingling, numbness, weakness in legs.  Psychiatric/Behavioral: Positive for agitation. Negative for behavioral problems, dysphoric mood and sleep disturbance. The patient is nervous/anxious.        Irritable and mildly agitation at times, but sleeps well at night.     Immunization History  Administered Date(s) Administered  . Influenza-Unspecified 06/11/2015, 06/11/2019, 06/19/2020  . Moderna  SARS-COVID-2 Vaccination 09/10/2019, 10/08/2019  . PFIZER SARS-COV-2 Vaccination 09/10/2019  . Pneumococcal Polysaccharide-23 04/21/2011  . Pneumococcal-Unspecified 04/21/2019  . Td 04/22/2002  . Tetanus 05/16/2020  . Zoster 04/27/2012   Pertinent  Health Maintenance Due  Topic Date Due  . PNA vac Low Risk Adult (2 of 2 - PCV13) 04/20/2020  . INFLUENZA VACCINE  Completed   Fall Risk  03/24/2019 02/08/2018 07/28/2016  Falls in the past year? 0 Yes Yes  Number falls in past yr: 0 1 1  Injury with Fall? 0 No No  Risk for fall due to : - - History of fall(s);Impaired balance/gait;Impaired mobility  Follow up - - Falls evaluation completed   Functional Status Survey:    Vitals:   08/09/20 1255  BP: 116/84  Pulse: 64  Resp: 18  Temp: 97.9 F (36.6 C)  SpO2: 95%  Weight: 213 lb 3.2 oz (96.7 kg)  Height: '5\' 4"'  (1.626 m)   Body mass index is 36.6 kg/m. Physical Exam Vitals and nursing note reviewed.  Constitutional:      Appearance: Normal appearance.  HENT:     Head: Normocephalic and atraumatic.     Mouth/Throat:     Mouth: Mucous membranes are moist.  Eyes:     Extraocular Movements: Extraocular movements intact.     Conjunctiva/sclera: Conjunctivae normal.     Pupils: Pupils are equal, round, and reactive to light.  Cardiovascular:     Rate and Rhythm: Normal rate and regular rhythm.     Heart sounds: No murmur heard.   Pulmonary:     Effort: Pulmonary effort is normal.     Breath sounds: No rales.  Abdominal:     General: Bowel sounds are normal.     Palpations: Abdomen is soft.     Tenderness: There is no abdominal tenderness.  Musculoskeletal:     Cervical back: Normal range of motion and neck supple.     Right lower leg: Edema present.     Left lower leg: Edema present.     Comments: trace edema BLE. Left shoulder pain with ROM is chronic. Chronic R+L knee pain-mostly in the left knee, knee support L knee. S/p R hips replacement. S/p cervical/lumbar spine  surgeries.   Skin:    General: Skin is warm and dry.     Comments: a slow healing pressure ulcer in the right groin skin fold is covered in dressing during my examination today.    Neurological:     General:  No focal deficit present.     Mental Status: He is alert. Mental status is at baseline.     Motor: Weakness present.     Gait: Gait abnormal.     Comments: Moves slow, uses electric w/c for mobility,  lower body weakness, peripheral neuropathy in legs.   Psychiatric:        Mood and Affect: Mood normal.        Behavior: Behavior normal.        Thought Content: Thought content normal.     Comments: Oriented to person, place. Appears anxious or obsessed or repetitive with his health concerns.      Labs reviewed: Recent Labs    02/24/20 0000 03/22/20 0000  NA 141 139  K 4.0 4.3  CL 105 104  CO2 29* 29*  BUN 20 21  CREATININE 0.9 1.0  CALCIUM 9.0 8.6*   Recent Labs    02/24/20 0000  AST 17  ALT 15  ALKPHOS 88  ALBUMIN 4.2   Recent Labs    02/24/20 0000  WBC 5.3  NEUTROABS 3,222  HGB 13.8  HCT 42  PLT 171   Lab Results  Component Value Date   TSH 4.34 05/18/2020   No results found for: HGBA1C Lab Results  Component Value Date   CHOL 139 02/24/2020   HDL 37 02/24/2020   LDLCALC 82 02/24/2020   TRIG 100 02/24/2020   CHOLHDL 5.1 (H) 03/31/2019    Significant Diagnostic Results in last 30 days:  No results found.  Assessment/Plan Anxiety 08/09/20 Cathy Showfety Psych: Depakote 125m tid po, update CMP/eGFR, CBC/diff, Valproic acid level 4 weeks.   Peripheral neuropathy Peripheral neuropathy, weakness in legs, w/c for mobility, on Gabapentin 1063mqd.    Osteoarthritis, multiple sites Left knee OA, takign Mobic 15 mg qd since 05/01/20, X-ray R hip/knee, f/u Ortho   Urinary frequency Urinary frequency, stable, on Tamsulosin.   Hypothyroidism Hypothyroidism, takes Levothyroxine, TSH 4.34 05/18/20   Edema Edema BLE, takes Furosemide,  Bun/creat 21/1.0 03/22/20   Constipation Constipation, takes Senokot S,   GERD (gastroesophageal reflux disease) GERD, takes Omeprazole 2056md.     Family/ staff Communication:   Labs/tests ordered:    Time spend 40 minutes.

## 2020-08-09 NOTE — Assessment & Plan Note (Signed)
Hypothyroidism, takes Levothyroxine, TSH 4.34 05/18/20

## 2020-08-09 NOTE — Assessment & Plan Note (Signed)
Edema BLE, takes Furosemide, Bun/creat 21/1.0 03/22/20

## 2020-08-09 NOTE — Assessment & Plan Note (Signed)
Urinary frequency, stable, on Tamsulosin.

## 2020-08-09 NOTE — Assessment & Plan Note (Signed)
Left knee OA, takign Mobic 15 mg qd since 05/01/20, X-ray R hip/knee, f/u Ortho 

## 2020-08-09 NOTE — Assessment & Plan Note (Signed)
08/09/20 [name] Psych: Depakote 155m tid po, update CMP/eGFR, CBC/diff, Valproic acid level 4 weeks.

## 2020-08-10 ENCOUNTER — Encounter: Payer: Self-pay | Admitting: Nurse Practitioner

## 2020-08-27 ENCOUNTER — Ambulatory Visit: Payer: Medicare PPO | Admitting: Orthopaedic Surgery

## 2020-08-27 ENCOUNTER — Ambulatory Visit (INDEPENDENT_AMBULATORY_CARE_PROVIDER_SITE_OTHER): Payer: Medicare PPO

## 2020-08-27 ENCOUNTER — Encounter: Payer: Self-pay | Admitting: Orthopaedic Surgery

## 2020-08-27 DIAGNOSIS — M25511 Pain in right shoulder: Secondary | ICD-10-CM

## 2020-08-27 MED ORDER — METHYLPREDNISOLONE ACETATE 40 MG/ML IJ SUSP
40.0000 mg | INTRAMUSCULAR | Status: AC | PRN
  Administered 2020-08-27: 40 mg via INTRA_ARTICULAR

## 2020-08-27 MED ORDER — LIDOCAINE HCL 1 % IJ SOLN
3.0000 mL | INTRAMUSCULAR | Status: AC | PRN
  Administered 2020-08-27: 3 mL

## 2020-08-27 NOTE — Progress Notes (Signed)
Office Visit Note   Patient: [name]           Date of Birth: 1931/05/20           MRN: [id number] Visit Date: 08/27/2020              Requested by: Mast, Man X, NP [address],  [place] [address] PCP: Mast, Man X, NP   Assessment & Plan: Visit Diagnoses:  1. Right shoulder pain, unspecified chronicity     Plan: I did recommend a right shoulder subacromial steroid injection today and he agreed with this and tolerated it well.  I told him to give it a few weeks and if his shoulder is not getting better to call us back to get an appointment with Dr. Junius Roads to try a steroid injection in his right glenohumeral joint under ultrasound.  He and his wife agree with this treatment plan.  They did tolerate my steroid injection well today in the subacromial space.  All questions and concerns were answered and addressed.  Follow-Up Instructions: Return if symptoms worsen or fail to improve.   Orders:  Orders Placed This Encounter  Procedures  . Large Joint Inj  . XR Shoulder Right   No orders of the defined types were placed in this encounter.     Procedures: Large Joint Inj: R subacromial bursa on 08/27/2020 4:01 PM Indications: pain and diagnostic evaluation Details: 22 G 1.5 in needle  Arthrogram: No  Medications: 3 mL lidocaine 1 %; 40 mg methylPREDNISolone acetate 40 MG/ML Outcome: tolerated well, no immediate complications Procedure, treatment alternatives, risks and benefits explained, specific risks discussed. Consent was given by the patient. Immediately prior to procedure a time out was called to verify the correct patient, procedure, equipment, support staff and site/side marked as required. Patient was prepped and draped in the usual sterile fashion.       Clinical Data: No additional findings.   Subjective: Chief Complaint  Patient presents with  . Right Shoulder - Pain  The patient is an 86 year old gentleman when seen before.  He ambulates  mainly in a rolling scooter assistive device wheelchair which is a power mobility chair.  He reports right shoulder pain and that is why he comes in today with no known injury.  He has painful range of motion and decreased strength of that shoulder.  It does occasionally wake him up at night and bother him in the evenings.  He says he can lift his left shoulder over his head but he cannot with his right shoulder.  He is not a diabetic.  HPI  Review of Systems He currently denies any headache, chest pain, shortness of breath, fever, chills, nausea, vomiting  Objective: Vital Signs: There were no vitals taken for this visit.  Physical Exam He is alert and oriented x3 and in no acute distress Ortho Exam His left shoulder moves smoothly.  His right shoulder shows some limitations with abduction and external rotation with pain at the glenohumeral joint and the [facility] joint as well as the subacromial outlet.  There are some weakness in the shoulder as well. Specialty Comments:  No specialty comments available.  Imaging: XR Shoulder Right  Result Date: 08/27/2020 3 views of the right shoulder shows no acute findings.  There is arthritis at the glenohumeral joint and the [facility] joint.  The humeral head is not high riding.    PMFS History: Patient Active Problem List   Diagnosis Date Noted  .  Anxiety 08/09/2020  . GERD (gastroesophageal reflux disease) 05/10/2020  . Pressure ulcer, stage II ([place]) 03/15/2020  . Seborrheic dermatitis 02/24/2020  . Fall 01/02/2020  . Left shoulder pain 11/02/2019  . Osteoarthritis, multiple sites 11/02/2019  . Hypothyroidism 08/10/2019  . Peripheral neuropathy 03/10/2018  . Unilateral primary osteoarthritis, right knee 12/02/2016  . Presence of right artificial hip joint 09/23/2016  . Acute pain of left knee 09/19/2016  . UTI (urinary tract infection) 07/30/2016  . Memory loss 07/28/2016  . Anemia   . Hypertension   . Lumbar spinal stenosis   . Sleep apnea    . Constipation   . Urinary frequency   . History of fall 07/25/2016  . Gait abnormality 07/25/2016  . Venous thrombosis of leg 07/24/2016  . Osteoarthritis of right hip 07/24/2016  . Edema 07/24/2016  . Spinal stenosis of lumbar region 05/28/2016  . Weight loss 04/08/2016  . Nuclear sclerosis of both eyes 01/04/2014  . Hypercholesteremia    Past Medical History:  Diagnosis Date  . Abnormal MRI, knee    Per [facility] New Patient Packet   . Anemia   . Anticoagulated   . Anxiety   . Arthritis   . Cervical disc disorder    Per [facility] Patient Packet   . Complication of anesthesia    states "I was in [place] for several weeks after surgery"caused by tramadol  . Constipation   . Dysuria 07/28/2016  . Edema 07/24/2016  . Gait abnormality 07/25/2016  . Hard of hearing   . Head injury    As a result of a fall, Per [facility] New Patient Packet   . Hip arthritis 07/21/2016  . History of fall 07/25/2016  . Hypercholesteremia   . Hypertension   . Lumbar spinal stenosis   . Memory loss 07/28/2016   mild  . Obesity    Per [facility] New Patient Packet   . Osteoarthritis of right hip 07/24/2016  . Peripheral neuropathy 03/10/2018  . Sleep apnea    cpap  . Spinal stenosis of lumbar region 05/28/2016  . Urinary frequency   . Venous thrombosis of leg 07/24/2016   right gastrocnemius  . Vertigo   . Wears glasses   . Weight loss 04/08/2016    Family History  Problem Relation Age of Onset  . Heart disease Mother   . Transient ischemic attack Mother   . Kidney failure Father   . Alzheimer's disease Sister   . AAA (abdominal aortic aneurysm) Sister   . Lung cancer Brother   . Memory loss Sister   . Memory loss Sister   . Heart disease Sister   . Cancer Sister   . Diabetes Sister   . Cancer Sister     Past Surgical History:  Procedure Laterality Date  . BACK SURGERY  10/17, 80's  . COLONOSCOPY    . COLONOSCOPY     Per [facility] New Patient Packet, [place]   . ESOPHAGOGASTRODUODENOSCOPY     . LUMBAR LAMINECTOMY/DECOMPRESSION MICRODISCECTOMY Right 05/28/2016   Procedure: Right Lumbar Three-Four Microdiscectomy;  Surgeon: [name], MD;  Location: [place] [facility];  Service: Neurosurgery;  Laterality: Right;  . TOTAL HIP ARTHROPLASTY Right 07/21/2016   Procedure: TOTAL HIP ARTHROPLASTY ANTERIOR APPROACH;  Surgeon: [name], MD;  Location: [place];  Service: Orthopedics;  Laterality: Right;   Social History   Occupational History  . Not on file  Tobacco Use  . Smoking status: Former Smoker    Packs/day: 1.00  Years: 4.00    Pack years: 4.00    Quit date: 07/16/1968    Years since quitting: 52.1  . Smokeless tobacco: Never Used  . Tobacco comment: quit 50 years ago  Vaping Use  . Vaping Use: Never used  Substance and Sexual Activity  . Alcohol use: No  . Drug use: No  . Sexual activity: Not Currently    Partners: Female

## 2020-08-30 DIAGNOSIS — H2513 Age-related nuclear cataract, bilateral: Secondary | ICD-10-CM | POA: Diagnosis not present

## 2020-08-30 DIAGNOSIS — H524 Presbyopia: Secondary | ICD-10-CM | POA: Diagnosis not present

## 2020-08-30 DIAGNOSIS — H52203 Unspecified astigmatism, bilateral: Secondary | ICD-10-CM | POA: Diagnosis not present

## 2020-09-06 DIAGNOSIS — I10 Essential (primary) hypertension: Secondary | ICD-10-CM | POA: Diagnosis not present

## 2020-09-07 LAB — BASIC METABOLIC PANEL
BUN: 20 (ref 4–21)
CO2: 29 — AB (ref 13–22)
Chloride: 105 (ref 99–108)
Creatinine: 1 (ref 0.6–1.3)
Glucose: 90
Potassium: 3.9 (ref 3.4–5.3)
Sodium: 141 (ref 137–147)

## 2020-09-07 LAB — HEPATIC FUNCTION PANEL
ALT: 15 (ref 10–40)
AST: 17 (ref 14–40)
Alkaline Phosphatase: 86 (ref 25–125)
Bilirubin, Total: 0.6

## 2020-09-07 LAB — CBC AND DIFFERENTIAL
HCT: 40 — AB (ref 41–53)
Hemoglobin: 13.4 — AB (ref 13.5–17.5)
Neutrophils Absolute: 3545
Platelets: 170 (ref 150–399)
WBC: 5.1

## 2020-09-07 LAB — COMPREHENSIVE METABOLIC PANEL
Albumin: 3.9 (ref 3.5–5.0)
Calcium: 8.7 (ref 8.7–10.7)
Globulin: 2.1

## 2020-09-07 LAB — CBC: RBC: 4.38 (ref 3.87–5.11)

## 2020-10-09 ENCOUNTER — Other Ambulatory Visit: Payer: Self-pay

## 2020-10-09 ENCOUNTER — Encounter (HOSPITAL_BASED_OUTPATIENT_CLINIC_OR_DEPARTMENT_OTHER): Payer: Medicare PPO | Attending: Internal Medicine | Admitting: Internal Medicine

## 2020-10-09 DIAGNOSIS — S31103A Unspecified open wound of abdominal wall, right lower quadrant without penetration into peritoneal cavity, initial encounter: Secondary | ICD-10-CM | POA: Diagnosis not present

## 2020-10-09 DIAGNOSIS — L98499 Non-pressure chronic ulcer of skin of other sites with unspecified severity: Secondary | ICD-10-CM | POA: Insufficient documentation

## 2020-10-09 NOTE — Progress Notes (Signed)
WESLIE, PRETLOW ([id number]) Visit Report for 10/09/2020 Abuse/Suicide Risk Screen Details Patient Name: Date of Service: [name], [name] 10/09/2020 10:30 A M Medical Record Number: [id number] Patient Account Number: [id number] Date of Birth/Sex: Treating RN: 1930/11/25 (87 y.o. Ernestene Mention Primary Care Garielle Mroz: [name] Other Clinician: Referring Gini Caputo: Treating Kai Railsback/Extender: [name] , [name]: 0 Abuse/Suicide Risk Screen Items Answer ABUSE RISK SCREEN: Has anyone close to you tried to hurt or harm you recentlyo No Do you feel uncomfortable with anyone in your familyo No Has anyone forced you do things that you didnt want to doo No Electronic Signature(s) Signed: 10/09/2020 4:51:35 PM By: Baruch Gouty RN, BSN Entered By: Baruch Gouty on 10/09/2020 10:59:35 -------------------------------------------------------------------------------- Activities of Daily Living Details Patient Name: Date of Service: [name], [name] 10/09/2020 10:30 A M Medical Record Number: [id number] Patient Account Number: [id number] Date of Birth/Sex: Treating RN: 1930/12/15 (87 y.o. Ernestene Mention Primary Care Dorreen Valiente: [name] Other Clinician: Referring Kanya Potteiger: Treating Cherelle Midkiff/Extender: [name] , [name]: 0 Activities of Daily Living Items Answer Activities of Daily Living (Please select one for each item) Drive Automobile Not Able T Medications ake Need Assistance Use T elephone Completely Able Care for Appearance Need Assistance Use T oilet Completely Able Bath / Shower Need Assistance Dress Self Completely Able Feed Self Completely Able Walk Need Assistance Get In / Out Bed Completely Able Housework Need Assistance Prepare Meals Not Able Handle Money Completely Able Shop for Self Not Able Electronic Signature(s) Signed: 10/09/2020 4:51:35 PM By: Baruch Gouty RN, BSN Entered By:  Baruch Gouty on 10/09/2020 11:01:05 -------------------------------------------------------------------------------- Education Screening Details Patient Name: Date of Service: [name]. 10/09/2020 10:30 A M Medical Record Number: [id number] Patient Account Number: [id number] Date of Birth/Sex: Treating RN: 1930/12/22 (87 y.o. Ernestene Mention Primary Care Cyndie Woodbeck: [name] Other Clinician: Referring Debra Colon: Treating Bruno Leach/Extender: [name] , [name]: 0 Primary Learner Assessed: Patient Learning Preferences/Education Level/Primary Language Learning Preference: Explanation, Demonstration, Printed Material Highest Education Level: College or Above Preferred Language: English Cognitive Barrier Language Barrier: No Translator Needed: No Memory Deficit: No Emotional Barrier: No Cultural/Religious Beliefs Affecting Medical Care: No Physical Barrier Impaired Vision: Yes Glasses Impaired Hearing: No Decreased Hand dexterity: No Knowledge/Comprehension Knowledge Level: High Comprehension Level: High Ability to understand written instructions: High Ability to understand verbal instructions: High Motivation Anxiety Level: Calm Cooperation: Cooperative Education Importance: Acknowledges Need Interest in Health Problems: Asks Questions Perception: Coherent Willingness to Engage in Self-Management High Activities: Readiness to Engage in Self-Management High Activities: Electronic Signature(s) Signed: 10/09/2020 4:51:35 PM By: Baruch Gouty RN, BSN Entered By: Baruch Gouty on 10/09/2020 11:01:40 -------------------------------------------------------------------------------- Fall Risk Assessment Details Patient Name: Date of Service: Davene Costain C. 10/09/2020 10:30 A M Medical Record Number: [id number] Patient Account Number: [id number] Date of Birth/Sex: Treating RN: 1930-09-29 (87 y.o. Ernestene Mention Primary  Care Honorio Devol: Lennie Odor , Mast Other Clinician: Referring Charleene Callegari: Treating Renesha Lizama/Extender: [name] , [name]: 0 Fall Risk Assessment Items Have you had 2 or more falls in the last 12 monthso 0 No Have you had any fall that resulted in injury in the last 12 monthso 0 No FALLS RISK SCREEN History of falling - immediate or within 3 months 0 No Secondary diagnosis (Do you have 2 or more medical diagnoseso) 15 Yes Ambulatory aid None/bed rest/wheelchair/nurse 0 No Crutches/cane/walker 15 Yes Furniture 0 No Intravenous therapy Access/Saline/Heparin  Lock 0 No Gait/Transferring Normal/ bed rest/ wheelchair 0 No Weak (short steps with or without shuffle, stooped but able to lift head while walking, may seek 10 Yes support from furniture) Impaired (short steps with shuffle, may have difficulty arising from chair, head down, impaired 0 No balance) Mental Status Oriented to own ability 0 Yes Electronic Signature(s) Signed: 10/09/2020 4:51:35 PM By: Baruch Gouty RN, BSN Entered By: Baruch Gouty on 10/09/2020 11:01:55 -------------------------------------------------------------------------------- Foot Assessment Details Patient Name: Date of Service: [name]. 10/09/2020 10:30 A M Medical Record Number: [id number] Patient Account Number: [id number] Date of Birth/Sex: Treating RN: Jun 05, 1931 (87 y.o. Ernestene Mention Primary Care Anyela Napierkowski: Lennie Odor , Mast Other Clinician: Referring Teigen Bellin: Treating Veona Bittman/Extender: [name] , [name]: 0 Foot Assessment Items Site Locations + = Sensation present, - = Sensation absent, C = Callus, U = Ulcer R = Redness, W = Warmth, M = Maceration, PU = Pre-ulcerative lesion F = Fissure, S = Swelling, D = Dryness Assessment Right: Left: Other Deformity: No No Prior Foot Ulcer: No No Prior Amputation: No No Charcot Joint: No No Ambulatory Status: Ambulatory With  Help Assistance Device: Walker Gait: Administrator, arts) Signed: 10/09/2020 4:51:35 PM By: Baruch Gouty RN, BSN Entered By: Baruch Gouty on 10/09/2020 11:02:27 -------------------------------------------------------------------------------- Nutrition Risk Screening Details Patient Name: Date of Service: [name] 10/09/2020 10:30 A M Medical Record Number: [id number] Patient Account Number: [id number] Date of Birth/Sex: Treating RN: 09/02/1931 (87 y.o. Ernestene Mention Primary Care Nasia Cannan: [name] Other Clinician: Referring Thos Matsumoto: Treating Hermina Barnard/Extender: [name] , [name]: 0 Height (in): 69 Weight (lbs): 222 Body Mass Index (BMI): 32.8 Nutrition Risk Screening Items Score Screening NUTRITION RISK SCREEN: I have an illness or condition that made me change the kind and/or amount of food I eat 0 No I eat fewer than two meals per day 0 No I eat few fruits and vegetables, or milk products 0 No I have three or more drinks of beer, liquor or wine almost every day 0 No I have tooth or mouth problems that make it hard for me to eat 0 No I don't always have enough money to buy the food I need 0 No I eat alone most of the time 0 No I take three or more different prescribed or over-the-counter drugs a day 1 Yes Without wanting to, I have lost or gained 10 pounds in the last six months 0 No I am not always physically able to shop, cook and/or feed myself 0 No Nutrition Protocols Good Risk Protocol 0 No interventions needed Moderate Risk Protocol High Risk Proctocol Risk Level: Good Risk Score: 1 Electronic Signature(s) Signed: 10/09/2020 4:51:35 PM By: Baruch Gouty RN, BSN Entered By: Baruch Gouty on 10/09/2020 11:02:13

## 2020-10-12 ENCOUNTER — Encounter: Payer: Self-pay | Admitting: Nurse Practitioner

## 2020-10-12 ENCOUNTER — Non-Acute Institutional Stay: Payer: Medicare PPO | Admitting: Nurse Practitioner

## 2020-10-12 DIAGNOSIS — G609 Hereditary and idiopathic neuropathy, unspecified: Secondary | ICD-10-CM | POA: Diagnosis not present

## 2020-10-12 DIAGNOSIS — L8942 Pressure ulcer of contiguous site of back, buttock and hip, stage 2: Secondary | ICD-10-CM

## 2020-10-12 DIAGNOSIS — R350 Frequency of micturition: Secondary | ICD-10-CM | POA: Diagnosis not present

## 2020-10-12 DIAGNOSIS — R609 Edema, unspecified: Secondary | ICD-10-CM

## 2020-10-12 DIAGNOSIS — F419 Anxiety disorder, unspecified: Secondary | ICD-10-CM | POA: Diagnosis not present

## 2020-10-12 DIAGNOSIS — E039 Hypothyroidism, unspecified: Secondary | ICD-10-CM

## 2020-10-12 DIAGNOSIS — K219 Gastro-esophageal reflux disease without esophagitis: Secondary | ICD-10-CM

## 2020-10-12 DIAGNOSIS — K5903 Drug induced constipation: Secondary | ICD-10-CM

## 2020-10-12 DIAGNOSIS — G8929 Other chronic pain: Secondary | ICD-10-CM

## 2020-10-12 DIAGNOSIS — R635 Abnormal weight gain: Secondary | ICD-10-CM

## 2020-10-12 DIAGNOSIS — M25512 Pain in left shoulder: Secondary | ICD-10-CM | POA: Diagnosis not present

## 2020-10-12 NOTE — Assessment & Plan Note (Addendum)
Peripheral neuropathy, weakness in legs, w/c for mobility, on Gabapentin 100mg  qd. Takes Vit B12. Had nerve conduction study 2019

## 2020-10-12 NOTE — Assessment & Plan Note (Signed)
Anxiety, psych consult 08/09/20, the patient declined recommended Depakote. TSH 4.34 05/18/20

## 2020-10-12 NOTE — Progress Notes (Signed)
Location:   [place] Room Number: [place] of Service:  ALF (13) Provider:  [name] NP  Sumit Branham X, NP  Patient Care Team: Tomaz Janis X, NP as PCP - General (Internal Medicine) [name], [name], MD as Consulting Physician (Orthopedic Surgery) [name], MD as Consulting Physician (Gastroenterology) System, Provider Not In [name], MD as Consulting Physician (Neurosurgery) [name], MD as Referring Physician (Ophthalmology) Rashon Westrup X, NP as Nurse Practitioner (Internal Medicine) Almedia Balls, MD as Referring Physician (Orthopedic Surgery) [name], MD as Consulting Physician (Neurosurgery) Elsie Saas, MD as Consulting Physician (Orthopedic Surgery) Allyn Kenner, MD (Dermatology) Kathrynn Ducking, MD as Consulting Physician (Neurology) Franchot Gallo, MD as Consulting Physician (Urology)  Extended Emergency Contact Information Primary Emergency Contact: [name] Address: [address]          [place], [place] [address] [place] Phone: [phone] Relation: Spouse  Code Status:   Goals of care: Advanced Directive information Advanced Directives 07/11/2020  Does Patient Have a Medical Advance Directive? Yes  Type of Paramedic of [place];Living will;Out of facility DNR (pink MOST or yellow form)  Does patient want to make changes to medical advance directive? No - Patient declined  Copy of [place] in Chart? Yes - validated most recent copy scanned in chart (See row information)  Pre-existing out of facility DNR order (yellow form or pink MOST form) Pink MOST form placed in chart (order not valid for inpatient use)     Chief Complaint  Patient presents with  . Medical Management of Chronic Issues  . Health Maintenance    PCV13    HPI:  Pt is a 87 y.o. male seen today for medical management of chronic  diseases.       Peripheral neuropathy, weakness in legs, w/c for mobility, on Gabapentin 162m qd. Takes Vit B12. Had nerve conduction study 03/10/18  R groin pressure ulcer, saw Wound Care specialist, treated with Silver Alginate.   Anxiety, psych consult 08/09/20, the patient declined recommended Depakote. TSH 4.34 05/18/20 Left knee OA, takign Mobic 15 mg qd since 05/01/20, X-ray R hip/knee, f/u Ortho Urinary frequency, stable, on Tamsulosin.underwent Urology evaluation in the past.  Hypothyroidism, takes Levothyroxine, TSH 4.34 05/18/20 Edema BLE, takes Furosemide, Bun/creat 20/1.0 09/07/20 Constipation, takes Senokot S,  GERD, takes Omeprazole 262mqd. Hgb 13.4 09/07/20  Past Medical History:  Diagnosis Date  . Abnormal MRI, knee    [facility]   . Anemia   . Anticoagulated   . Anxiety   . Arthritis   . Cervical disc disorder    [facility]   . Complication of anesthesia    states "I was in [place] for several weeks after surgery"caused by tramadol  . Constipation   . Dysuria 07/28/2016  . Edema 07/24/2016  . Gait abnormality 07/25/2016  . Hard of hearing   . Head injury    [facility] a [facility]   . Hip arthritis 07/21/2016  . History of fall 07/25/2016  . Hypercholesteremia   . Hypertension   . Lumbar spinal stenosis   . Memory loss 07/28/2016   mild  . Obesity    [facility]   . Osteoarthritis of right hip 07/24/2016  . Peripheral neuropathy 03/10/2018  . Sleep apnea  cpap  . Spinal stenosis of lumbar region 05/28/2016  . Urinary frequency   . Venous thrombosis of leg 07/24/2016   right gastrocnemius  . Vertigo   . Wears glasses   . Weight loss 04/08/2016   Past Surgical History:  Procedure Laterality Date  . BACK SURGERY  10/17, 80's  . COLONOSCOPY    . COLONOSCOPY     Per [facility] New Patient Packet, [place]   .  ESOPHAGOGASTRODUODENOSCOPY    . LUMBAR LAMINECTOMY/DECOMPRESSION MICRODISCECTOMY Right 05/28/2016   Procedure: Right Lumbar Three-Four Microdiscectomy;  Surgeon: [name], MD;  Location: [place] [facility];  Service: Neurosurgery;  Laterality: Right;  . TOTAL HIP ARTHROPLASTY Right 07/21/2016   Procedure: TOTAL HIP ARTHROPLASTY ANTERIOR APPROACH;  Surgeon: [name], MD;  Location: [place];  Service: Orthopedics;  Laterality: Right;    Allergies  Allergen Reactions  . Tramadol Other (See Comments)    "does not eat" LOSS OF APPETITE    Allergies as of 10/12/2020      Reactions   Tramadol Other (See Comments)   "does not eat" LOSS OF APPETITE      Medication List       Accurate as of October 12, 2020 11:59 PM. If you have any questions, ask your nurse or doctor.        acetaminophen 500 MG tablet Commonly known as: TYLENOL Take 1,000 mg by mouth every 8 (eight) hours as needed.   aspirin EC 81 MG tablet Take 81 mg by mouth daily. 8am.   atorvastatin 20 MG tablet Commonly known as: LIPITOR Take 20 mg by mouth daily. 8pm.   B-12 1000 MCG Tabs Take 1 tablet by mouth every other day. 8am.   CoQ10 100 MG Caps Take 1 capsule by mouth daily. 8am.   diphenhydrAMINE 25 MG tablet Commonly known as: BENADRYL Take 25 mg by mouth as needed.   fexofenadine 180 MG tablet Commonly known as: ALLEGRA Take 180 mg by mouth daily as needed for allergies or rhinitis.   fluticasone 50 MCG/ACT nasal spray Commonly known as: FLONASE Place 2 sprays into both nostrils as needed.   furosemide 20 MG tablet Commonly known as: LASIX Take 20 mg by mouth. Mon, Wed, Fri   gabapentin 100 MG capsule Commonly known as: NEURONTIN Take 100 mg by mouth at bedtime. 8pm.   Glucosamine 500 MG Caps Take by mouth. Take 3 pills to equal 1551m daily.   hydrocortisone 2.5 % cream Apply topically 2 (two) times daily as needed.   ketoconazole 2 % cream Commonly known as: NIZORAL Apply 1  application topically 2 (two) times daily as needed for irritation.   levothyroxine 50 MCG tablet Commonly known as: SYNTHROID Take 50 mcg by mouth daily before breakfast.   meclizine 25 MG tablet Commonly known as: ANTIVERT Take 25 mg by mouth 3 (three) times daily as needed for dizziness.   meloxicam 15 MG tablet Commonly known as: MOBIC Take 15 mg by mouth daily.   NAT-RUL PSYLLIUM SEED HUSKS PO Take 1 tablet by mouth as needed. For constipation.   omeprazole 20 MG capsule Commonly known as: PRILOSEC Take 20 mg by mouth daily.   sennosides-docusate sodium 8.6-50 MG tablet Commonly known as: SENOKOT-S Take 1 tablet by mouth daily.   sodium chloride 0.65 % Soln nasal spray Commonly known as: OCEAN Place 2 sprays into both nostrils as needed for congestion.   tamsulosin 0.4 MG Caps capsule Commonly known as: FLOMAX Take 0.4 mg by mouth at bedtime.  Review of Systems  Constitutional: Positive for unexpected weight change. Negative for activity change, fatigue and fever.       Weight gained #9Ibs in 2 months?  HENT: Positive for hearing loss. Negative for congestion and voice change.   Eyes: Negative for visual disturbance.  Respiratory: Negative for cough and shortness of breath.   Cardiovascular: Positive for leg swelling.  Gastrointestinal: Negative for abdominal pain and constipation.  Genitourinary: Positive for frequency. Negative for difficulty urinating and urgency.  Musculoskeletal: Positive for arthralgias, back pain and gait problem.       R+L knees, left shoulder, s/p cervical spine+lumber spien surgeries. Mostly pain is in the right knee presently.   Skin: Positive for wound. Negative for color change.  Neurological: Positive for weakness and numbness. Negative for speech difficulty.       Memory lapses. Tingling, numbness, weakness in legs.  Psychiatric/Behavioral: Negative for behavioral problems, dysphoric mood and sleep disturbance. The patient is  nervous/anxious.        Irritable and mildly agitation at times, but sleeps well at night.     Immunization History  Administered Date(s) Administered  . Influenza-Unspecified 06/11/2015, 06/11/2019, 06/19/2020  . Moderna Sars-Covid-2 Vaccination 09/10/2019, 10/08/2019, 07/17/2020  . PFIZER(Purple Top)SARS-COV-2 Vaccination 09/10/2019  . Pneumococcal Polysaccharide-23 04/21/2011  . Pneumococcal-Unspecified 04/21/2019  . Td 04/22/2002  . Tetanus 05/16/2020  . Zoster 04/27/2012   Pertinent  Health Maintenance Due  Topic Date Due  . PNA vac Low Risk Adult (2 of 2 - PCV13) 04/20/2020  . INFLUENZA VACCINE  Completed   Fall Risk  03/24/2019 02/08/2018 07/28/2016  Falls in the past year? 0 Yes Yes  Number falls in past yr: 0 1 1  Injury with Fall? 0 No No  Risk for fall due to : - - History of fall(s);Impaired balance/gait;Impaired mobility  Follow up - - Falls evaluation completed   Functional Status Survey:    Vitals:   10/12/20 1105  BP: 132/64  Pulse: 79  Resp: 20  Temp: (!) 97.2 F (36.2 C)  SpO2: 97%  Weight: 222 lb 12.8 oz (101.1 kg)  Height: '5\' 4"'  (1.626 m)   Body mass index is 38.24 kg/m. Physical Exam Vitals and nursing note reviewed.  Constitutional:      Appearance: Normal appearance.  HENT:     Head: Normocephalic and atraumatic.     Mouth/Throat:     Mouth: Mucous membranes are moist.  Eyes:     Extraocular Movements: Extraocular movements intact.     Conjunctiva/sclera: Conjunctivae normal.     Pupils: Pupils are equal, round, and reactive to light.  Cardiovascular:     Rate and Rhythm: Normal rate and regular rhythm.     Heart sounds: No murmur heard.   Pulmonary:     Effort: Pulmonary effort is normal.     Breath sounds: No rales.  Abdominal:     General: Bowel sounds are normal.     Palpations: Abdomen is soft.     Tenderness: There is no abdominal tenderness.  Musculoskeletal:     Cervical back: Normal range of motion and neck supple.      Right lower leg: Edema present.     Left lower leg: Edema present.     Comments: trace edema BLE. Left shoulder pain with ROM is chronic. Chronic R+L knee pain-mostly in the left knee, knee support L knee. S/p R hips replacement. S/p cervical/lumbar spine surgeries.   Skin:    General: Skin is warm and dry.  Comments: a slow healing pressure ulcer in the right groin skin fold is treated with silver alginate.    Neurological:     General: No focal deficit present.     Mental Status: He is alert. Mental status is at baseline.     Motor: Weakness present.     Gait: Gait abnormal.     Comments: Moves slow, uses electric w/c for mobility,  lower body weakness, peripheral neuropathy in legs.   Psychiatric:        Mood and Affect: Mood normal.        Behavior: Behavior normal.        Thought Content: Thought content normal.     Comments: Oriented to person, place. Appears anxious or obsessed or repetitive with his health concerns.      Labs reviewed: Recent Labs    02/24/20 0000 03/22/20 0000 09/07/20 0000  NA 141 139 141  K 4.0 4.3 3.9  CL 105 104 105  CO2 29* 29* 29*  BUN '20 21 20  ' CREATININE 0.9 1.0 1.0  CALCIUM 9.0 8.6* 8.7   Recent Labs    02/24/20 0000 09/07/20 0000  AST 17 17  ALT 15 15  ALKPHOS 88 86  ALBUMIN 4.2 3.9   Recent Labs    02/24/20 0000 09/07/20 0000  WBC 5.3 5.1  NEUTROABS 3,222 3,545.00  HGB 13.8 13.4*  HCT 42 40*  PLT 171 170   Lab Results  Component Value Date   TSH 4.34 05/18/2020   No results found for: HGBA1C Lab Results  Component Value Date   CHOL 139 02/24/2020   HDL 37 02/24/2020   LDLCALC 82 02/24/2020   TRIG 100 02/24/2020   CHOLHDL 5.1 (H) 03/31/2019    Significant Diagnostic Results in last 30 days:  No results found.  Assessment/Plan Peripheral neuropathy Peripheral neuropathy, weakness in legs, w/c for mobility, on Gabapentin 175m qd. Takes Vit B12. Had nerve conduction study 2019  Pressure ulcer, stage II  ([place] R groin pressure ulcer, saw Wound Care specialist, treated with Silver Alginate.   Anxiety Anxiety, psych consult 08/09/20, the patient declined recommended Depakote. TSH 4.34 05/18/20  Left shoulder pain Left knee OA, takign Mobic 15 mg qd since 05/01/20, X-ray R hip/knee, f/u Ortho   Urinary frequency Urinary frequency, stable, on Tamsulosin. Underwent Urology evaluation in the past.   Hypothyroidism takes Levothyroxine, TSH 4.34 05/18/20  Edema Edema BLE, takes Furosemide, Bun/creat 20/1.0 09/07/20   Constipation Constipation, takes Senokot S,    GERD (gastroesophageal reflux disease) GERD, takes Omeprazole 249mqd. Hgb 13.4 09/07/20   Weight gain #9Ibs in the past 2 months, denied fatigue, lethargy, feeling cold. Will update CBC/diff, CMP/eGFR, TSH. Observe.      Family/ staff Communication: plan of care reviewed with the patient and charge nurse.   Labs/tests ordered:  CBC/diff, CMP/eGFR, TSH   Time spend 40 minutes.

## 2020-10-12 NOTE — Assessment & Plan Note (Signed)
Left knee OA, takign Mobic 15 mg qd since 05/01/20, X-ray R hip/knee, f/u Ortho 

## 2020-10-12 NOTE — Assessment & Plan Note (Signed)
Constipation, takes Senokot S,  

## 2020-10-12 NOTE — Assessment & Plan Note (Signed)
R groin pressure ulcer, saw Wound Care specialist, treated with Silver Alginate.  

## 2020-10-12 NOTE — Assessment & Plan Note (Signed)
Urinary frequency, stable, on Tamsulosin. Underwent Urology evaluation in the past.

## 2020-10-12 NOTE — Assessment & Plan Note (Signed)
Edema BLE, takes Furosemide, Bun/creat 20/1.0 09/07/20

## 2020-10-12 NOTE — Assessment & Plan Note (Signed)
takes Levothyroxine, TSH 4.34 05/18/20

## 2020-10-12 NOTE — Assessment & Plan Note (Signed)
GERD, takes Omeprazole 20mg  qd. Hgb 13.4 09/07/20

## 2020-10-15 NOTE — Progress Notes (Signed)
CHUE, BERKOVICH ([id number]) Visit Report for 10/09/2020 Chief Complaint Document Details Patient Name: Date of Service: [name], [name] 10/09/2020 10:30 A M Medical Record Number: [id number] Patient Account Number: [id number] Date of Birth/Sex: Treating RN: 12-31-1930 (87 y.o. Male) Rhae Hammock Primary Care Provider: [name] Other Clinician: Referring Provider: Treating Provider/Extender: [name] , [name]: 0 Information Obtained from: Patient Chief Complaint 10/09/2020; patient is here for review of wounds and a lower abdominal pannus on the right Electronic Signature(s) Signed: 10/09/2020 4:57:44 PM By: Linton Ham MD Entered By: Linton Ham on 10/09/2020 11:59:38 -------------------------------------------------------------------------------- HPI Details Patient Name: Date of Service: Travis Costain C. 10/09/2020 10:30 A M Medical Record Number: [id number] Patient Account Number: [id number] Date of Birth/Sex: Treating RN: 25-Apr-1931 (87 y.o. Male) Rhae Hammock Primary Care Provider: [name] Other Clinician: Referring Provider: Treating Provider/Extender: [name] , [name]: 0 History of Present Illness HPI Description: ADMISSION 10/09/2020 This is a pleasant 61 year old man from assisted living at [facility] [place] accompanied by his wife. They are here for a many month progression of the wounds in the right lower abdominal pannus fold. This initially was noted apparently in June 2021. There is some suggestion that he may have scratched this area and opened the wound but the patient does not remember doing this. They were using silver alginate in this area for a while but more recently a hydrocolloid. The area is not painful. There is also suggestion that they are using nystatin for a while however they are not doing this currently. The patient is fairly adamant that he is not allowing  his waistband of his clothing to rub on this area Past medical history; idiopathic peripheral neuropathy, osteoarthritis, BPH, falls, lumbar spinal stenosis, sleep apnea, small vessel DVT hypertension, lower , extremity edema. The patient walks for short distances with a walker the rest of the time he is in a wheelchair. They moved into the assisted living order a [facility] [place] about 9 months ago previously were at independent level Electronic Signature(s) Signed: 10/09/2020 4:57:44 PM By: Linton Ham MD Entered By: Linton Ham on 10/09/2020 12:02:26 -------------------------------------------------------------------------------- Chemical Cauterization Details Patient Name: Date of Service: [name]. 10/09/2020 10:30 A M Medical Record Number: [id number] Patient Account Number: [id number] Date of Birth/Sex: Treating RN: [birth year] (87 y.o. Male) Rhae Hammock Primary Care Provider: [name] Other Clinician: Referring Provider: Treating Provider/Extender: [name] , [name]: 0 Procedure Performed for: Wound #1 Right,Lateral Abdomen - Lower Quadrant Performed By: Physician Ricard Dillon., MD Post Procedure Diagnosis Same as Pre-procedure Notes silver nitrate Electronic Signature(s) Signed: 10/09/2020 4:57:44 PM By: Linton Ham MD Entered By: Linton Ham on 10/09/2020 11:59:06 -------------------------------------------------------------------------------- Chemical Cauterization Details Patient Name: Date of Service: [name] 10/09/2020 10:30 A M Medical Record Number: [id number] Patient Account Number: [id number] Date of Birth/Sex: Treating RN: Feb 06, 1931 (87 y.o. Male) Rhae Hammock Primary Care Provider: [name] Other Clinician: Referring Provider: Treating Provider/Extender: [name] , [name]: 0 Procedure Performed for: Wound #2 Right Abdomen - Lower  Quadrant Performed By: Physician Ricard Dillon., MD Post Procedure Diagnosis Same as Pre-procedure Notes silver nitrate Electronic Signature(s) Signed: 10/09/2020 4:57:44 PM By: Linton Ham MD Entered By: Linton Ham on 10/09/2020 11:59:15 -------------------------------------------------------------------------------- Physical Exam Details Patient Name: Date of Service: [name] 10/09/2020 10:30 A M Medical Record Number: [id number] Patient Account Number: [id number] Date  of Birth/Sex: Treating RN: 1930-12-17 (87 y.o. Male) Rhae Hammock Primary Care Provider: [name] Other Clinician: Referring Provider: Treating Provider/Extender: [name] , [name]: 0 Constitutional Sitting or standing Blood Pressure is within target range for patient.. Pulse regular and within target range for patient.Marland Kitchen Respirations regular, non-labored and within target range.. Temperature is normal and within the target range for the patient.Marland Kitchen Appears in no distress. Gastrointestinal (GI) Abdomen is soft and non-distended without masses or tenderness.. No liver or spleen enlargement. Integumentary (Hair, Skin) I see no primary skin issues. Notes Wound exam; the area in question is on the right lower quadrant just above the inguinal fold. There are 2 open areas here the more substantial one is to the right and a new worse area medially is smaller. The larger wound has very friable hyper granulated tissue which looks odd for a pannus friction type of wound. I used silver nitrate to the entirety of the surfaces of both wounds and to not back some of the friable hyper granulated tissue. Hemostasis with direct pressure. The skin around the wound looks really quite good. There is no evidence of fungal infection, chronic irritation. Electronic Signature(s) Signed: 10/09/2020 4:57:44 PM By: Linton Ham MD Entered By: Linton Ham on 10/09/2020  12:07:51 -------------------------------------------------------------------------------- Physician Orders Details Patient Name: Date of Service: [name]. 10/09/2020 10:30 A M Medical Record Number: [id number] Patient Account Number: [id number] Date of Birth/Sex: Treating RN: [birth year] (87 y.o. Male) Rhae Hammock Primary Care Provider: [name] Other Clinician: Referring Provider: Treating Provider/Extender: [name] , [name]: 0 Verbal / Phone Orders: No Diagnosis Coding Follow-up Appointments Return Appointment in 2 weeks. Bathing/ Shower/ Hygiene May shower with protection but do not get wound dressing(s) wet. - May use soap and water in the shower Off-Loading Turn and reposition every 2 hours Wound Treatment Wound #1 - Abdomen - Lower Quadrant Wound Laterality: Right, Lateral Cleanser: Normal Saline (DME) (Generic) 1 x Per Day/7 Days Discharge Instructions: Cleanse the wound with Normal Saline prior to applying a clean dressing using gauze sponges, not tissue or cotton balls. Cleanser: Soap and Water 1 x Per Day/7 Days Discharge Instructions: May shower and wash wound with dial antibacterial soap and water prior to dressing change. Prim Dressing: KerraCel Ag Gelling Fiber Dressing, 4x5 in (silver alginate) (DME) (Generic) 1 x Per Day/7 Days ary Discharge Instructions: Apply silver alginate to wound bed as instructed Secondary Dressing: Woven Gauze Sponge, Non-Sterile 4x4 in (DME) (Generic) 1 x Per Day/7 Days Discharge Instructions: Apply over primary dressing as directed. Secondary Dressing: ABD Pad, 5x9 (DME) 1 x Per Day/7 Days Discharge Instructions: Apply over primary dressing as directed. Secured With: 25M Medipore H Soft Cloth Surgical T 4 x 2 (in/yd) (DME) (Generic) 1 x Per Day/7 Days ape Discharge Instructions: Secure dressing with tape as directed. Wound #2 - Abdomen - Lower Quadrant Wound Laterality: Right Cleanser:  Normal Saline (Generic) 1 x Per Day/7 Days Discharge Instructions: Cleanse the wound with Normal Saline prior to applying a clean dressing using gauze sponges, not tissue or cotton balls. Cleanser: Soap and Water 1 x Per Day/7 Days Discharge Instructions: May shower and wash wound with dial antibacterial soap and water prior to dressing change. Prim Dressing: KerraCel Ag Gelling Fiber Dressing, 4x5 in (silver alginate) (Generic) 1 x Per Day/7 Days ary Discharge Instructions: Apply silver alginate to wound bed as instructed Secondary Dressing: Woven Gauze Sponge, Non-Sterile 4x4 in (Generic) 1 x  Per Day/7 Days Discharge Instructions: Apply over primary dressing as directed. Secondary Dressing: ABD Pad, 5x9 1 x Per Day/7 Days Discharge Instructions: Apply over primary dressing as directed. Secured With: 33M Medipore H Soft Cloth Surgical T 4 x 2 (in/yd) (Generic) 1 x Per Day/7 Days ape Discharge Instructions: Secure dressing with tape as directed. Electronic Signature(s) Signed: 10/09/2020 4:57:44 PM By: Linton Ham MD Signed: 10/15/2020 9:09:54 AM By: Rhae Hammock RN Entered By: Rhae Hammock on 10/09/2020 11:41:57 -------------------------------------------------------------------------------- Problem List Details Patient Name: Date of Service: [name]. 10/09/2020 10:30 A M Medical Record Number: [id number] Patient Account Number: [id number] Date of Birth/Sex: Treating RN: 1930-11-07 (87 y.o. Male) Rhae Hammock Primary Care Provider: [name] Other Clinician: Referring Provider: Treating Provider/Extender: [name] , [name]: 0 Active Problems ICD-10 Encounter Code Description Active Date MDM Diagnosis S31.103D Unspecified open wound of abdominal wall, right lower quadrant without 10/09/2020 No Yes penetration into peritoneal cavity, subsequent encounter Inactive Problems Resolved Problems Electronic Signature(s) Signed:  10/09/2020 4:57:44 PM By: Linton Ham MD Entered By: Linton Ham on 10/09/2020 11:58:20 -------------------------------------------------------------------------------- Progress Note Details Patient Name: Date of Service: [name]. 10/09/2020 10:30 A M Medical Record Number: [id number] Patient Account Number: [id number] Date of Birth/Sex: Treating RN: 06/27/1931 (87 y.o. Male) Rhae Hammock Primary Care Provider: [name] Other Clinician: Referring Provider: Treating Provider/Extender: [name] , [name]: 0 Subjective Chief Complaint Information obtained from Patient 10/09/2020; patient is here for review of wounds and a lower abdominal pannus on the right History of Present Illness (HPI) ADMISSION 10/09/2020 This is a pleasant 70 year old man from assisted living at [facility] [place] accompanied by his wife. They are here for a many month progression of the wounds in the right lower abdominal pannus fold. This initially was noted apparently in June 2021. There is some suggestion that he may have scratched this area and opened the wound but the patient does not remember doing this. They were using silver alginate in this area for a while but more recently a hydrocolloid. The area is not painful. There is also suggestion that they are using nystatin for a while however they are not doing this currently. The patient is fairly adamant that he is not allowing his waistband of his clothing to rub on this area Past medical history; idiopathic peripheral neuropathy, osteoarthritis, BPH, falls, lumbar spinal stenosis, sleep apnea, small vessel DVT hypertension, lower , extremity edema. The patient walks for short distances with a walker the rest of the time he is in a wheelchair. They moved into the assisted living order a friend[facility] home [place] about 9 months ago previously were at independent level Patient History Information obtained  from Patient. Allergies tramadol Family History Cancer - Siblings, Diabetes - Siblings, Hypertension - Mother, No family history of Heart Disease, Hereditary Spherocytosis, Kidney Disease, Lung Disease, Seizures, Stroke, Thyroid Problems, Tuberculosis. Social History Former smoker - quit 1960s, Marital Status - Married, Alcohol Use - Never, Drug Use - No History, Caffeine Use - Rarely. Medical History Eyes Patient has history of Cataracts - mild Denies history of Glaucoma, Optic Neuritis Ear/Nose/Mouth/Throat Denies history of Chronic sinus problems/congestion, Middle ear problems Hematologic/Lymphatic Patient has history of Anemia Respiratory Patient has history of Sleep Apnea Cardiovascular Patient has history of Hypertension Genitourinary Denies history of End Stage Renal Disease Integumentary (Skin) Denies history of History of Burn Musculoskeletal Patient has history of Osteoarthritis Denies history of Gout, Rheumatoid Arthritis, Osteomyelitis  Neurologic Patient has history of Neuropathy Oncologic Denies history of Received Chemotherapy, Received Radiation Psychiatric Denies history of Anorexia/bulimia, Confinement Anxiety Hospitalization/Surgery History - back surgery. - lumbar laminectomy/decompression. - right total hip arthroplasty. Medical A Surgical History Notes nd Constitutional Symptoms (General Health) obesity Cardiovascular hypercholesteremia, venous thrombus right gastrocnemius Endocrine hypothyroidism Musculoskeletal spinal stenosis, cervical disc disorder Review of Systems (ROS) Constitutional Symptoms (General Health) Denies complaints or symptoms of Fatigue, Fever, Chills, Marked Weight Change. Eyes Complains or has symptoms of Glasses / Contacts. Ear/Nose/Mouth/Throat Denies complaints or symptoms of Chronic sinus problems or rhinitis. Respiratory Denies complaints or symptoms of Chronic or frequent coughs, Shortness of  Breath. Cardiovascular Denies complaints or symptoms of Chest pain. Gastrointestinal Denies complaints or symptoms of Frequent diarrhea, Nausea, Vomiting. Endocrine Denies complaints or symptoms of Heat/cold intolerance. Genitourinary Complains or has symptoms of Frequent urination. Integumentary (Skin) Complains or has symptoms of Wounds - abdomen. Musculoskeletal Denies complaints or symptoms of Muscle Pain, Muscle Weakness. Psychiatric Denies complaints or symptoms of Claustrophobia, Suicidal. Objective Constitutional Sitting or standing Blood Pressure is within target range for patient.. Pulse regular and within target range for patient.Marland Kitchen Respirations regular, non-labored and within target range.. Temperature is normal and within the target range for the patient.Marland Kitchen Appears in no distress. Vitals Time Taken: 10:46 AM, Height: 69 in, Source: Stated, Weight: 222 lbs, Source: Stated, BMI: 32.8, Temperature: 98.1 F, Pulse: 79 bpm, Respiratory Rate: 18 breaths/min, Blood Pressure: 128/76 mmHg. Gastrointestinal (GI) Abdomen is soft and non-distended without masses or tenderness.. No liver or spleen enlargement. General Notes: Wound exam; the area in question is on the right lower quadrant just above the inguinal fold. There are 2 open areas here the more substantial one is to the right and a new worse area medially is smaller. The larger wound has very friable hyper granulated tissue which looks odd for a pannus friction type of wound. I used silver nitrate to the entirety of the surfaces of both wounds and to not back some of the friable hyper granulated tissue. Hemostasis with direct pressure. The skin around the wound looks really quite good. There is no evidence of fungal infection, chronic irritation. Integumentary (Hair, Skin) I see no primary skin issues. Wound #1 status is Open. Original cause of wound was Gradually Appeared. The wound is located on the Right,Lateral Abdomen -  Lower Quadrant. The wound measures 2.8cm length x 6cm width x 0.1cm depth; 13.195cm^2 area and 1.319cm^3 volume. There is Fat Layer (Subcutaneous Tissue) exposed. There is no tunneling or undermining noted. There is a medium amount of serosanguineous drainage noted. The wound margin is flat and intact. There is large (67-100%) red, friable, hyper - granulation within the wound bed. There is no necrotic tissue within the wound bed. Wound #2 status is Open. Original cause of wound was Gradually Appeared. The wound is located on the Right Abdomen - Lower Quadrant. The wound measures 0.7cm length x 1.7cm width x 0.1cm depth; 0.935cm^2 area and 0.093cm^3 volume. There is Fat Layer (Subcutaneous Tissue) exposed. There is no tunneling or undermining noted. There is a medium amount of serosanguineous drainage noted. The wound margin is flat and intact. There is large (67-100%) red granulation within the wound bed. There is no necrotic tissue within the wound bed. Assessment Active Problems ICD-10 Unspecified open wound of abdominal wall, right lower quadrant without penetration into peritoneal cavity, subsequent encounter Procedures Wound #1 Pre-procedure diagnosis of Wound #1 is an Abrasion located on the Right,Lateral Abdomen - Lower Quadrant . An  Chemical Cauterization procedure was performed by Ricard Dillon., MD. Post procedure Diagnosis Wound #1: Same as Pre-Procedure Notes: silver nitrate Wound #2 Pre-procedure diagnosis of Wound #2 is an Abrasion located on the Right Abdomen - Lower Quadrant . An Chemical Cauterization procedure was performed by Ricard Dillon., MD. Post procedure Diagnosis Wound #2: Same as Pre-Procedure Notes: silver nitrate Plan Follow-up Appointments: Return Appointment in 2 weeks. Bathing/ Shower/ Hygiene: May shower with protection but do not get wound dressing(s) wet. - May use soap and water in the shower Off-Loading: Turn and reposition every 2  hours WOUND #1: - Abdomen - Lower Quadrant Wound Laterality: Right, Lateral Cleanser: Normal Saline (DME) (Generic) 1 x Per Day/7 Days Discharge Instructions: Cleanse the wound with Normal Saline prior to applying a clean dressing using gauze sponges, not tissue or cotton balls. Cleanser: Soap and Water 1 x Per Day/7 Days Discharge Instructions: May shower and wash wound with dial antibacterial soap and water prior to dressing change. Prim Dressing: KerraCel Ag Gelling Fiber Dressing, 4x5 in (silver alginate) (DME) (Generic) 1 x Per Day/7 Days ary Discharge Instructions: Apply silver alginate to wound bed as instructed Secondary Dressing: Woven Gauze Sponge, Non-Sterile 4x4 in (DME) (Generic) 1 x Per Day/7 Days Discharge Instructions: Apply over primary dressing as directed. Secondary Dressing: ABD Pad, 5x9 (DME) 1 x Per Day/7 Days Discharge Instructions: Apply over primary dressing as directed. Secured With: 17M Medipore H Soft Cloth Surgical T 4 x 2 (in/yd) (DME) (Generic) 1 x Per Day/7 Days ape Discharge Instructions: Secure dressing with tape as directed. WOUND #2: - Abdomen - Lower Quadrant Wound Laterality: Right Cleanser: Normal Saline (Generic) 1 x Per Day/7 Days Discharge Instructions: Cleanse the wound with Normal Saline prior to applying a clean dressing using gauze sponges, not tissue or cotton balls. Cleanser: Soap and Water 1 x Per Day/7 Days Discharge Instructions: May shower and wash wound with dial antibacterial soap and water prior to dressing change. Prim Dressing: KerraCel Ag Gelling Fiber Dressing, 4x5 in (silver alginate) (Generic) 1 x Per Day/7 Days ary Discharge Instructions: Apply silver alginate to wound bed as instructed Secondary Dressing: Woven Gauze Sponge, Non-Sterile 4x4 in (Generic) 1 x Per Day/7 Days Discharge Instructions: Apply over primary dressing as directed. Secondary Dressing: ABD Pad, 5x9 1 x Per Day/7 Days Discharge Instructions: Apply over primary  dressing as directed. Secured With: 17M Medipore H Soft Cloth Surgical T 4 x 2 (in/yd) (Generic) 1 x Per Day/7 Days ape Discharge Instructions: Secure dressing with tape as directed. 1. Although this wound was in his lower abdominal pannus the entire situation was less than overwhelming with regards to the type of wound that we usually see in large abdominal pannus injuries. In particular there is no evidence of moisture, Candida and the overall pannus fold is really not all that large. Nevertheless I am going to treat these wounds as if that is the cause. Specific to this hydrocolloid's are not a good idea because they keep the areas moist. I will switch back to silver alginate with ABDs and tape. The patient was warned to keep these areas separated. As he will need to keep these clean and dry they will need to be changed daily for now 2. I will continue with the silver nitrate as long as the tissue looks friable. 3. The patient asked me if this could be a skin cancer and truthfully the area looks somewhat atypical as to the causation. I am less then certain about the  hypothesis of a pannus injury that I discussed in #1 above. I gave him the timeframe that if this does not improve in 1 month and/or gets larger I will definitely do punch biopsies of this area I spent 35 minutes in review of this patient's past medical history, face-to-face evaluation and preparation of this Electronic Signature(s) Signed: 10/09/2020 4:57:44 PM By: Linton Ham MD Entered By: Linton Ham on 10/09/2020 12:10:43 -------------------------------------------------------------------------------- HxROS Details Patient Name: Date of Service: Travis Costain C. 10/09/2020 10:30 A M Medical Record Number: [id number] Patient Account Number: [id number] Date of Birth/Sex: Treating RN: 1930-12-06 (87 y.o. Male) Baruch Gouty Primary Care Provider: [name] Other Clinician: Referring Provider: Treating  Provider/Extender: [name] , [name]: 0 Information Obtained From Patient Constitutional Symptoms (General Health) Complaints and Symptoms: Negative for: Fatigue; Fever; Chills; Marked Weight Change Medical History: Past Medical History Notes: obesity Eyes Complaints and Symptoms: Positive for: Glasses / Contacts Medical History: Positive for: Cataracts - mild Negative for: Glaucoma; Optic Neuritis Ear/Nose/Mouth/Throat Complaints and Symptoms: Negative for: Chronic sinus problems or rhinitis Medical History: Negative for: Chronic sinus problems/congestion; Middle ear problems Respiratory Complaints and Symptoms: Negative for: Chronic or frequent coughs; Shortness of Breath Medical History: Positive for: Sleep Apnea Cardiovascular Complaints and Symptoms: Negative for: Chest pain Medical History: Positive for: Hypertension Past Medical History Notes: hypercholesteremia, venous thrombus right gastrocnemius Gastrointestinal Complaints and Symptoms: Negative for: Frequent diarrhea; Nausea; Vomiting Endocrine Complaints and Symptoms: Negative for: Heat/cold intolerance Medical History: Past Medical History Notes: hypothyroidism Genitourinary Complaints and Symptoms: Positive for: Frequent urination Medical History: Negative for: End Stage Renal Disease Integumentary (Skin) Complaints and Symptoms: Positive for: Wounds - abdomen Medical History: Negative for: History of Burn Musculoskeletal Complaints and Symptoms: Negative for: Muscle Pain; Muscle Weakness Medical History: Positive for: Osteoarthritis Negative for: Gout; Rheumatoid Arthritis; Osteomyelitis Past Medical History Notes: spinal stenosis, cervical disc disorder Psychiatric Complaints and Symptoms: Negative for: Claustrophobia; Suicidal Medical History: Negative for: Anorexia/bulimia; Confinement Anxiety Hematologic/Lymphatic Medical History: Positive for:  Anemia Immunological Neurologic Medical History: Positive for: Neuropathy Oncologic Medical History: Negative for: Received Chemotherapy; Received Radiation HBO Extended History Items Eyes: Cataracts Immunizations Pneumococcal Vaccine: Received Pneumococcal Vaccination: Yes Tetanus Vaccine: Last tetanus shot: 04/22/2002 Implantable Devices No devices added Hospitalization / Surgery History Type of Hospitalization/Surgery back surgery lumbar laminectomy/decompression right total hip arthroplasty Family and Social History Cancer: Yes - Siblings; Diabetes: Yes - Siblings; Heart Disease: No; Hereditary Spherocytosis: No; Hypertension: Yes - Mother; Kidney Disease: No; Lung Disease: No; Seizures: No; Stroke: No; Thyroid Problems: No; Tuberculosis: No; Former smoker - quit 1960s; Marital Status - Married; Alcohol Use: Never; Drug Use: No History; Caffeine Use: Rarely; Financial Concerns: No; Food, Clothing or Shelter Needs: No; Support System Lacking: No; Transportation Concerns: No Engineer, maintenance) Signed: 10/09/2020 4:51:35 PM By: Baruch Gouty RN, BSN Signed: 10/09/2020 4:57:44 PM By: Linton Ham MD Entered By: Baruch Gouty on 10/09/2020 10:59:06 -------------------------------------------------------------------------------- [place] Details Patient Name: Date of Service: [name] 10/09/2020 Medical Record Number: [id number] Patient Account Number: [id number] Date of Birth/Sex: Treating RN: September 24, 1930 (87 y.o. Male) Rhae Hammock Primary Care Provider: [name] Other Clinician: Referring Provider: Treating Provider/Extender: [name] , [name]: 0 Diagnosis Coding ICD-10 Codes Code Description S31.103D Unspecified open wound of abdominal wall, right lower quadrant without penetration into peritoneal cavity, subsequent encounter Facility Procedures CPT4 Code: [id number] Description: 99213 - WOUND CARE VISIT-LEV  3 EST PT Modifier: Quantity: 1 Physician Procedures Electronic Signature(s) Signed:  10/09/2020 4:57:44 PM By: Linton Ham MD Signed: 10/15/2020 9:09:54 AM By: Rhae Hammock RN Entered By: Rhae Hammock on 10/09/2020 16:28:15

## 2020-10-15 NOTE — Progress Notes (Signed)
[name] ([id number]) Visit Report for 10/09/2020 Allergy List Details Patient Name: Date of Service: [name], [name] 10/09/2020 10:30 A M Medical Record Number: [id number] Patient Account Number: [id number] Date of Birth/Sex: Treating RN: [birth year] (87 y.o. Male) Baruch Gouty Primary Care Anayla Giannetti: [name] Other Clinician: Referring Brigg Cape: Treating Sokha Craker/Extender: [name] , [name]: 0 Allergies Active Allergies tramadol Type: Food Allergy Notes Electronic Signature(s) Signed: 10/09/2020 4:51:35 PM By: Baruch Gouty RN, BSN Entered By: Baruch Gouty on 10/09/2020 10:50:33 -------------------------------------------------------------------------------- Arrival Information Details Patient Name: Date of Service: [name] 10/09/2020 10:30 A M Medical Record Number: [id number] Patient Account Number: [id number] Date of Birth/Sex: Treating RN: 1931-05-15 (87 y.o. Male) Baruch Gouty Primary Care Johanne Mcglade: [name] Other Clinician: Referring Ece Cumberland: Treating Nikoleta Dady/Extender: [name] , [name]: 0 Visit Information Patient Arrived: Wheel Chair Arrival Time: 10:45 Accompanied By: spouse Transfer Assistance: None Patient Identification Verified: Yes Secondary Verification Process Completed: Yes Patient Requires Transmission-Based Precautions: No Patient Has Alerts: No Electronic Signature(s) Signed: 10/09/2020 4:51:35 PM By: Baruch Gouty RN, BSN Entered By: Baruch Gouty on 10/09/2020 10:46:00 -------------------------------------------------------------------------------- Clinic Level of Care Assessment Details Patient Name: Date of Service: [name] 10/09/2020 10:30 A M Medical Record Number: [id number] Patient Account Number: [id number] Date of Birth/Sex: Treating RN: 09-13-1930 (87 y.o. Male) Rhae Hammock Primary Care Trayquan Kolakowski: [name] Other  Clinician: Referring Chasya Zenz: Treating Ernisha Sorn/Extender: [name] , [name]: 0 Clinic Level of Care Assessment Items TOOL 1 Quantity Score X- 1 0 Use when EandM and Procedure is performed on INITIAL visit ASSESSMENTS - Nursing Assessment / Reassessment X- 1 20 General Physical Exam (combine w/ comprehensive assessment (listed just below) when performed on new pt. evals) X- 1 25 Comprehensive Assessment (HX, ROS, Risk Assessments, Wounds Hx, etc.) ASSESSMENTS - Wound and Skin Assessment / Reassessment X- 1 10 Dermatologic / Skin Assessment (not related to wound area) ASSESSMENTS - Ostomy and/or Continence Assessment and Care []  - 0 Incontinence Assessment and Management []  - 0 Ostomy Care Assessment and Management (repouching, etc.) PROCESS - Coordination of Care X - Simple Patient / Family Education for ongoing care 1 15 []  - 0 Complex (extensive) Patient / Family Education for ongoing care X- 1 10 Staff obtains Programmer, systems, Records, T Results / Process Orders est X- 1 10 Staff telephones HHA, Nursing Homes / Clarify orders / etc []  - 0 Routine Transfer to another Facility (non-emergent condition) []  - 0 Routine Hospital Admission (non-emergent condition) X- 1 15 New Admissions / Biomedical engineer / Ordering NPWT Apligraf, etc. , []  - 0 Emergency Hospital Admission (emergent condition) PROCESS - Special Needs []  - 0 Pediatric / Minor Patient Management []  - 0 Isolation Patient Management []  - 0 Hearing / Language / Visual special needs []  - 0 Assessment of Community assistance (transportation, D/C planning, etc.) []  - 0 Additional assistance / Altered mentation []  - 0 Support Surface(s) Assessment (bed, cushion, seat, etc.) INTERVENTIONS - Miscellaneous []  - 0 External ear exam []  - 0 Patient Transfer (multiple staff / Civil Service fast streamer / Similar devices) []  - 0 Simple Staple / Suture removal (25 or less) []  - 0 Complex Staple  / Suture removal (26 or more) []  - 0 Hypo/Hyperglycemic Management (do not check if billed separately) []  - 0 Ankle / Brachial Index (ABI) - do not check if billed separately Has the patient been seen at the hospital within the last three years:  Yes Total Score: 105 Level Of Care: New/Established - Level 3 Electronic Signature(s) Signed: 10/15/2020 9:09:54 AM By: Fonnie Mu RN Entered By: Fonnie Mu on 10/09/2020 16:28:05 -------------------------------------------------------------------------------- Encounter Discharge Information Details Patient Name: Date of Service: Travis Sage C. 10/09/2020 10:30 A M Medical Record Number: [id number] Patient Account Number: [id number] Date of Birth/Sex: Treating RN: Nov 24, 1930 (87 y.o. Male) Zandra Abts Primary Care Zaniyah Wernette: Chipper Oman Other Clinician: Referring Jc Veron: Treating Norena Bratton/Extender: Jamesetta Orleans , [name]: 0 Encounter Discharge Information Items Discharge Condition: Stable Ambulatory Status: Wheelchair Discharge Destination: Home Transportation: Private Auto Accompanied By: wife Schedule Follow-up Appointment: Yes Clinical Summary of Care: Patient Declined Electronic Signature(s) Signed: 10/09/2020 5:18:27 PM By: Zandra Abts RN, BSN Entered By: Zandra Abts on 10/09/2020 16:35:20 -------------------------------------------------------------------------------- Lower Extremity Assessment Details Patient Name: Date of Service: Radene Adkins. 10/09/2020 10:30 A M Medical Record Number: [id number] Patient Account Number: [id number] Date of Birth/Sex: Treating RN: 1931-05-15 (87 y.o. Male) Zenaida Deed Primary Care Kassey Laforest: Chipper Oman Other Clinician: Referring Liron Eissler: Treating Nizar Cutler/Extender: Jamesetta Orleans , [name]: 0 Electronic Signature(s) Signed: 10/09/2020 4:51:35 PM By: Zenaida Deed RN, BSN Entered By: Zenaida Deed  on 10/09/2020 11:02:34 -------------------------------------------------------------------------------- Multi Wound Chart Details Patient Name: Date of Service: Travis Sage C. 10/09/2020 10:30 A M Medical Record Number: [id number] Patient Account Number: [id number] Date of Birth/Sex: Treating RN: 09/28/1930 (87 y.o. Male) Fonnie Mu Primary Care Ailynn Gow: Chipper Oman Other Clinician: Referring Benuel Ly: Treating Amauris Debois/Extender: Jamesetta Orleans , [name]: 0 Vital Signs Height(in): 69 Pulse(bpm): 79 Weight(lbs): 222 Blood Pressure(mmHg): 128/76 Body Mass Index(BMI): 33 Temperature(F): 98.1 Respiratory Rate(breaths/min): 18 Photos: [1:No Photos Right, Lateral Abdomen - Lower] [2:No Photos Right Abdomen - Lower Quadrant] [N/A:N/A N/A] Wound Location: [1:Quadrant Gradually Appeared] [2:Gradually Appeared] [N/A:N/A] Wounding Event: [1:Abrasion] [2:Abrasion] [N/A:N/A] Primary Etiology: [1:Cataracts, Anemia, Sleep Apnea,] [2:Cataracts, Anemia, Sleep Apnea,] [N/A:N/A] Comorbid History: [1:Hypertension, Osteoarthritis, Neuropathy 02/07/2020] [2:Hypertension, Osteoarthritis, Neuropathy 09/24/2020] [N/A:N/A] Date Acquired: [1:0] [2:0] [N/A:N/A] Weeks of Treatment: [1:Open] [2:Open] [N/A:N/A] Wound Status: [1:2.8x6x0.1] [2:0.7x1.7x0.1] [N/A:N/A] Measurements L x W x D (cm) [1:13.195] [2:0.935] [N/A:N/A] A (cm) : rea [1:1.319] [2:0.093] [N/A:N/A] Volume (cm) : [1:N/A] [2:0.00%] [N/A:N/A] % Reduction in Area: [1:N/A] [2:0.00%] [N/A:N/A] % Reduction in Volume: [1:Full Thickness Without Exposed] [2:Full Thickness Without Exposed] [N/A:N/A] Classification: [1:Support Structures Medium] [2:Support Structures Medium] [N/A:N/A] Exudate Amount: [1:Serosanguineous] [2:Serosanguineous] [N/A:N/A] Exudate Type: [1:red, brown] [2:red, brown] [N/A:N/A] Exudate Color: [1:Flat and Intact] [2:Flat and Intact] [N/A:N/A] Wound Margin: [1:Large (67-100%)] [2:Large  (67-100%)] [N/A:N/A] Granulation Amount: [1:Red, Hyper-granulation, Friable] [2:Red] [N/A:N/A] Granulation Quality: [1:None Present (0%)] [2:None Present (0%)] [N/A:N/A] Necrotic Amount: [1:Fat Layer (Subcutaneous Tissue): Yes Fat Layer (Subcutaneous Tissue): Yes N/A] Exposed Structures: [1:Fascia: No Tendon: No Muscle: No Joint: No Bone: No None] [2:Fascia: No Tendon: No Muscle: No Joint: No Bone: No Small (1-33%)] [N/A:N/A] Epithelialization: [1:Chemical Cauterization] [2:Chemical Cauterization] [N/A:N/A] Treatment Notes Electronic Signature(s) Signed: 10/09/2020 4:57:44 PM By: Baltazar Najjar MD Signed: 10/15/2020 9:09:54 AM By: Fonnie Mu RN Entered By: Baltazar Najjar on 10/09/2020 11:58:30 -------------------------------------------------------------------------------- Multi-Disciplinary Care Plan Details Patient Name: Date of Service: Radene Adkins. 10/09/2020 10:30 A M Medical Record Number: [id number] Patient Account Number: [id number] Date of Birth/Sex: Treating RN: 1931-07-04 (87 y.o. Male) Fonnie Mu Primary Care Arlin Savona: Chipper Oman Other Clinician: Referring Saheed Carrington: Treating Bird Tailor/Extender: Jamesetta Orleans , [name]: 0 Active Inactive Orientation to the Wound Care Program Nursing Diagnoses: Knowledge deficit related to the wound healing center  program Goals: Patient/caregiver will verbalize understanding of the [facility] Program Date Initiated: 10/09/2020 Target Resolution Date: 11/16/2020 Goal Status: Active Interventions: Provide education on orientation to the wound center Notes: Wound/Skin Impairment Nursing Diagnoses: Impaired tissue integrity Knowledge deficit related to ulceration/compromised skin integrity Goals: Patient will have a decrease in wound volume by X% from date: (specify in notes) Date Initiated: 10/09/2020 Target Resolution Date: 11/16/2020 Goal Status: Active Patient/caregiver will  verbalize understanding of skin care regimen Date Initiated: 10/09/2020 Target Resolution Date: 11/09/2020 Goal Status: Active Ulcer/skin breakdown will have a volume reduction of 30% by week 4 Date Initiated: 10/09/2020 Target Resolution Date: 11/09/2020 Goal Status: Active Interventions: Assess patient/caregiver ability to obtain necessary supplies Assess patient/caregiver ability to perform ulcer/skin care regimen upon admission and as needed Assess ulceration(s) every visit Notes: Electronic Signature(s) Signed: 10/15/2020 9:09:54 AM By: Rhae Hammock RN Entered By: Rhae Hammock on 10/09/2020 11:30:16 -------------------------------------------------------------------------------- Pain Assessment Details Patient Name: Date of Service: [name] 10/09/2020 10:30 A M Medical Record Number: [id number] Patient Account Number: [id number] Date of Birth/Sex: Treating RN: 1930/11/06 (87 y.o. Male) Baruch Gouty Primary Care Michole Lecuyer: [name] Other Clinician: Referring Larissa Pegg: Treating Alaija Ruble/Extender: [name] , [name]: 0 Active Problems Location of Pain Severity and Description of Pain Patient Has Paino No Site Locations Rate the pain. Current Pain Level: 0 Pain Management and Medication Current Pain Management: Electronic Signature(s) Signed: 10/09/2020 4:51:35 PM By: Baruch Gouty RN, BSN Signed: 10/09/2020 4:51:35 PM By: Baruch Gouty RN, BSN Entered By: Baruch Gouty on 10/09/2020 11:18:53 -------------------------------------------------------------------------------- Patient/Caregiver Education Details Patient Name: Date of Service: [name] 2/1/2022andnbsp10:30 [place] Record Number: [id number] Patient Account Number: [id number] Date of Birth/Gender: Treating RN: 1931-07-21 (87 y.o. Male) Rhae Hammock Primary Care Physician: [name] Other Clinician: Referring Physician: Treating  Physician/Extender: [name] , [name]: 0 Education Assessment Education Provided To: Patient Education Topics Provided Welcome T [facility]: o Methods: Explain/Verbal Responses: State content correctly Electronic Signature(s) Signed: 10/15/2020 9:09:54 AM By: Rhae Hammock RN Entered By: Rhae Hammock on 10/09/2020 11:30:28 -------------------------------------------------------------------------------- Wound Assessment Details Patient Name: Date of Service: [name] 10/09/2020 10:30 A M Medical Record Number: [id number] Patient Account Number: [id number] Date of Birth/Sex: Treating RN: May 08, 1931 (87 y.o. Male) Baruch Gouty Primary Care Sederick Jacobsen: Lennie Odor , Mast Other Clinician: Referring Abdulhamid Olgin: Treating Jaionna Weisse/Extender: [name] , [name]: 0 Wound Status Wound Number: 1 Primary Abrasion Etiology: Wound Location: Right, Lateral Abdomen - Lower Quadrant Wound Status: Open Wounding Event: Gradually Appeared Comorbid Cataracts, Anemia, Sleep Apnea, Hypertension, Osteoarthritis, Date Acquired: 02/07/2020 History: Neuropathy Weeks Of Treatment: 0 Clustered Wound: No Photos Photo Uploaded By: Mikeal Hawthorne on 10/12/2020 13:52:51 Wound Measurements Length: (cm) 2.8 Width: (cm) 6 Depth: (cm) 0.1 Area: (cm) 13.195 Volume: (cm) 1.319 % Reduction in Area: % Reduction in Volume: Epithelialization: None Tunneling: No Undermining: No Wound Description Classification: Full Thickness Without Exposed Support Structures Wound Margin: Flat and Intact Exudate Amount: Medium Exudate Type: Serosanguineous Exudate Color: red, brown Foul Odor After Cleansing: No Slough/Fibrino No Wound Bed Granulation Amount: Large (67-100%) Exposed Structure Granulation Quality: Red, Hyper-granulation, Friable Fascia Exposed: No Necrotic Amount: None Present (0%) Fat Layer (Subcutaneous Tissue)  Exposed: Yes Tendon Exposed: No Muscle Exposed: No Joint Exposed: No Bone Exposed: No Treatment Notes Wound #1 (Abdomen - Lower Quadrant) Wound Laterality: Right, Lateral Cleanser Normal Saline Discharge Instruction: Cleanse the wound with Normal Saline prior  to applying a clean dressing using gauze sponges, not tissue or cotton balls. Soap and Water Discharge Instruction: May shower and wash wound with dial antibacterial soap and water prior to dressing change. Peri-Wound Care Topical Primary Dressing KerraCel Ag Gelling Fiber Dressing, 4x5 in (silver alginate) Discharge Instruction: Apply silver alginate to wound bed as instructed Secondary Dressing Woven Gauze Sponge, Non-Sterile 4x4 in Discharge Instruction: Apply over primary dressing as directed. ABD Pad, 5x9 Discharge Instruction: Apply over primary dressing as directed. Secured With 50M Medipore H Soft Cloth Surgical T 4 x 2 (in/yd) ape Discharge Instruction: Secure dressing with tape as directed. Compression Wrap Compression Stockings Add-Ons Electronic Signature(s) Signed: 10/09/2020 4:51:35 PM By: Zenaida Deed RN, BSN Entered By: Zenaida Deed on 10/09/2020 11:13:08 -------------------------------------------------------------------------------- Wound Assessment Details Patient Name: Date of Service: Radene Adkins. 10/09/2020 10:30 A M Medical Record Number: [id number] Patient Account Number: [id number] Date of Birth/Sex: Treating RN: 1931/01/23 (87 y.o. Male) Zenaida Deed Primary Care Whittney Steenson: Arna Snipe , Mast Other Clinician: Referring Lanisa Ishler: Treating Vernetta Dizdarevic/Extender: Jamesetta Orleans , [name]: 0 Wound Status Wound Number: 2 Primary Abrasion Etiology: Wound Location: Right Abdomen - Lower Quadrant Wound Status: Open Wounding Event: Gradually Appeared Comorbid Cataracts, Anemia, Sleep Apnea, Hypertension, Osteoarthritis, Date Acquired: 09/24/2020 History:  Neuropathy Weeks Of Treatment: 0 Clustered Wound: No Photos Photo Uploaded By: Benjaman Kindler on 10/12/2020 13:52:51 Wound Measurements Length: (cm) 0.7 Width: (cm) 1.7 Depth: (cm) 0.1 Area: (cm) 0.935 Volume: (cm) 0.093 % Reduction in Area: 0% % Reduction in Volume: 0% Epithelialization: Small (1-33%) Tunneling: No Undermining: No Wound Description Classification: Full Thickness Without Exposed Support Structures Wound Margin: Flat and Intact Exudate Amount: Medium Exudate Type: Serosanguineous Exudate Color: red, brown Foul Odor After Cleansing: No Slough/Fibrino No Wound Bed Granulation Amount: Large (67-100%) Exposed Structure Granulation Quality: Red Fascia Exposed: No Necrotic Amount: None Present (0%) Fat Layer (Subcutaneous Tissue) Exposed: Yes Tendon Exposed: No Muscle Exposed: No Joint Exposed: No Bone Exposed: No Treatment Notes Wound #2 (Abdomen - Lower Quadrant) Wound Laterality: Right Cleanser Normal Saline Discharge Instruction: Cleanse the wound with Normal Saline prior to applying a clean dressing using gauze sponges, not tissue or cotton balls. Soap and Water Discharge Instruction: May shower and wash wound with dial antibacterial soap and water prior to dressing change. Peri-Wound Care Topical Primary Dressing KerraCel Ag Gelling Fiber Dressing, 4x5 in (silver alginate) Discharge Instruction: Apply silver alginate to wound bed as instructed Secondary Dressing Woven Gauze Sponge, Non-Sterile 4x4 in Discharge Instruction: Apply over primary dressing as directed. ABD Pad, 5x9 Discharge Instruction: Apply over primary dressing as directed. Secured With 50M Medipore H Soft Cloth Surgical T 4 x 2 (in/yd) ape Discharge Instruction: Secure dressing with tape as directed. Compression Wrap Compression Stockings Add-Ons Electronic Signature(s) Signed: 10/09/2020 4:51:35 PM By: Zenaida Deed RN, BSN Signed: 10/15/2020 9:09:54 AM By: Fonnie Mu  RN Entered By: Fonnie Mu on 10/09/2020 11:39:09 -------------------------------------------------------------------------------- Vitals Details Patient Name: Date of Service: Travis Sage C. 10/09/2020 10:30 A M Medical Record Number: [id number] Patient Account Number: [id number] Date of Birth/Sex: Treating RN: 1930/12/15 (87 y.o. Male) Zenaida Deed Primary Care Sahory Nordling: Arna Snipe , Mast Other Clinician: Referring Alistar Mcenery: Treating Winnie Umali/Extender: Jamesetta Orleans , [name]: 0 Vital Signs Time Taken: 10:46 Temperature (F): 98.1 Height (in): 69 Pulse (bpm): 79 Source: Stated Respiratory Rate (breaths/min): 18 Weight (lbs): 222 Blood Pressure (mmHg): 128/76 Source: Stated Reference Range: 80 - 120 mg / dl Body Mass Index (BMI): 32.8 Electronic Signature(s)  Signed: 10/09/2020 4:51:35 PM By: Baruch Gouty RN, BSN Entered By: Baruch Gouty on 10/09/2020 10:49:37

## 2020-10-16 ENCOUNTER — Encounter: Payer: Self-pay | Admitting: Nurse Practitioner

## 2020-10-16 DIAGNOSIS — R635 Abnormal weight gain: Secondary | ICD-10-CM | POA: Insufficient documentation

## 2020-10-16 NOTE — Assessment & Plan Note (Signed)
#  9Ibs in the past 2 months, denied fatigue, lethargy, feeling cold. Will update CBC/diff, CMP/eGFR, TSH. Observe.

## 2020-10-18 DIAGNOSIS — I10 Essential (primary) hypertension: Secondary | ICD-10-CM | POA: Diagnosis not present

## 2020-10-19 LAB — CBC AND DIFFERENTIAL
HCT: 38 — AB (ref 41–53)
Hemoglobin: 12.5 — AB (ref 13.5–17.5)
Neutrophils Absolute: 2794
Platelets: 148 — AB (ref 150–399)
WBC: 4.4

## 2020-10-19 LAB — COMPREHENSIVE METABOLIC PANEL
Albumin: 3.4 — AB (ref 3.5–5.0)
Calcium: 8.4 — AB (ref 8.7–10.7)
Globulin: 2.1

## 2020-10-19 LAB — BASIC METABOLIC PANEL
BUN: 19 (ref 4–21)
CO2: 27 — AB (ref 13–22)
Chloride: 106 (ref 99–108)
Creatinine: 0.8 (ref 0.6–1.3)
Glucose: 72
Potassium: 4.6 (ref 3.4–5.3)
Sodium: 141 (ref 137–147)

## 2020-10-19 LAB — TSH: TSH: 5.76 (ref 0.41–5.90)

## 2020-10-19 LAB — HEPATIC FUNCTION PANEL
ALT: 16 (ref 10–40)
AST: 16 (ref 14–40)
Alkaline Phosphatase: 80 (ref 25–125)
Bilirubin, Total: 0.5

## 2020-10-19 LAB — CBC: RBC: 4.2 (ref 3.87–5.11)

## 2020-10-23 ENCOUNTER — Other Ambulatory Visit: Payer: Self-pay

## 2020-10-23 ENCOUNTER — Encounter (HOSPITAL_BASED_OUTPATIENT_CLINIC_OR_DEPARTMENT_OTHER): Payer: Medicare PPO | Admitting: Internal Medicine

## 2020-10-23 DIAGNOSIS — L98499 Non-pressure chronic ulcer of skin of other sites with unspecified severity: Secondary | ICD-10-CM | POA: Diagnosis not present

## 2020-10-23 DIAGNOSIS — L97812 Non-pressure chronic ulcer of other part of right lower leg with fat layer exposed: Secondary | ICD-10-CM | POA: Diagnosis not present

## 2020-10-26 NOTE — Progress Notes (Signed)
Travis, Adkins ([id number]) Visit Report for 10/23/2020 HPI Details Patient Name: Date of Service: [name], [name] 10/23/2020 10:45 A M Medical Record Number: [id number] Patient Account Number: [id number] Date of Birth/Sex: Treating RN: [birth year] (87 y.o. [name] Primary Care Provider: Marlana Adkins Other Clinician: Referring Provider: Treating Provider/Extender: [name] , [name] in Treatment: 2 History of Present Illness HPI Description: ADMISSION 10/09/2020 This is a pleasant 87 year old man from assisted living at [facility] [place] accompanied by his wife. They are here for a many month progression of the wounds in the right lower abdominal pannus fold. This initially was noted apparently in June 2021. There is some suggestion that he may have scratched this area and opened the wound but the patient does not remember doing this. They were using silver alginate in this area for a while but more recently a hydrocolloid. The area is not painful. There is also suggestion that they are using nystatin for a while however they are not doing this currently. The patient is fairly adamant that he is not allowing his waistband of his clothing to rub on this area Past medical history; idiopathic peripheral neuropathy, osteoarthritis, BPH, falls, lumbar spinal stenosis, sleep apnea, small vessel DVT hypertension, lower , extremity edema. The patient walks for short distances with a walker the rest of the time he is in a wheelchair. They moved into the assisted living order a [facility] [place] about 9 months ago previously were at independent level 2/15; right lower abdominal pannus fold wound. This was a bit atypical last week but I elected to treat this is the usual friction type wound in the pannus fold using silver alginate offloading the area etc. He had one larger area and a smaller area both of these seem better and surface area has  improved Electronic Signature(s) Signed: 10/23/2020 5:22:09 PM By: Travis Ham MD Entered By: [name] on 10/23/2020 12:53:59 -------------------------------------------------------------------------------- Chemical Cauterization Details Patient Name: Date of Service: [name] 10/23/2020 10:45 A M Medical Record Number: [id number] Patient Account Number: [id number] Date of Birth/Sex: Treating RN: [birth year] (87 y.o. [name] Primary Care Provider: Marlana Adkins Other Clinician: Referring Provider: Treating Provider/Extender: [name] , [name] in Treatment: 2 Procedure Performed for: Wound #1 Right,Proximal,Lateral Abdomen - Lower Quadrant Performed By: Physician [name]., MD Post Procedure Diagnosis Same as Pre-procedure Notes silver nitrate Electronic Signature(s) Signed: 10/23/2020 5:22:09 PM By: Travis Ham MD Entered By: [name] on 10/23/2020 12:51:47 -------------------------------------------------------------------------------- Chemical Cauterization Details Patient Name: Date of Service: [name] 10/23/2020 10:45 A M Medical Record Number: [id number] Patient Account Number: [id number] Date of Birth/Sex: Treating RN: [birth year] (87 y.o. [name] Primary Care Provider: Marlana Adkins Other Clinician: Referring Provider: Treating Provider/Extender: [name] , [name] in Treatment: 2 Procedure Performed for: Wound #2 Right,Distal,Lateral Abdomen - Lower Quadrant Performed By: Physician [name]., MD Post Procedure Diagnosis Same as Pre-procedure Notes silver nitrate Electronic Signature(s) Signed: 10/23/2020 5:22:09 PM By: Travis Ham MD Entered By: [name] on 10/23/2020 12:51:56 -------------------------------------------------------------------------------- Physical Exam Details Patient Name: Date of Service: [name]  10/23/2020 10:45 A M Medical Record Number: [id number] Patient Account Number: [id number] Date of Birth/Sex: Treating RN: 1931-01-29 (87 y.o. [name] Primary Care Provider: Marlana Adkins Other Clinician: Referring Provider: Treating Provider/Extender: [name] , [name] in Treatment: 2 Constitutional Sitting or standing Blood Pressure is within target range  for patient.. Pulse regular and within target range for patient.Marland Kitchen Respirations regular, non-labored and within target range.. Temperature is normal and within the target range for the patient.Marland Kitchen Appears in no distress. Notes Wound exam; the area is on the right lower quadrant just above the inguinal fold. More substantial area is smaller and the surface looks better. There is rims of epithelialization the smaller one medially looks as though it is just about closing. The skin in the area looks quite good I used silver nitrate on both wound areas because of the friable surface Electronic Signature(s) Signed: 10/23/2020 5:22:09 PM By: Travis Ham MD Entered By: [name] on 10/23/2020 12:56:44 -------------------------------------------------------------------------------- Physician Orders Details Patient Name: Date of Service: [name]. 10/23/2020 10:45 A M Medical Record Number: [id number] Patient Account Number: [id number] Date of Birth/Sex: Treating RN: Oct 16, 1930 (87 y.o. [name] Primary Care Provider: Marlana Adkins Other Clinician: Referring Provider: Treating Provider/Extender: [name] , [name] in Treatment: 2 Verbal / Phone Orders: No Diagnosis Coding Follow-up Appointments Return Appointment in 2 Adkins. Bathing/ Shower/ Hygiene May shower with protection but do not get wound dressing(s) wet. - May use soap and water in the shower Off-Loading Turn and reposition every 2 hours Wound Treatment Wound #1 - Abdomen - Lower Quadrant Wound  Laterality: Right, Lateral, Proximal Cleanser: Soap and Water 1 x Per Day/7 Days Discharge Instructions: May shower and wash wound with dial antibacterial soap and water prior to dressing change. Cleanser: Normal Saline (Generic) 1 x Per Day/7 Days Discharge Instructions: Cleanse the wound with Normal Saline prior to applying a clean dressing using gauze sponges, not tissue or cotton balls. Prim Dressing: KerraCel Ag Gelling Fiber Dressing, 4x5 in (silver alginate) (Generic) 1 x Per Day/7 Days ary Discharge Instructions: Apply silver alginate to wound bed as instructed Secondary Dressing: Woven Gauze Sponge, Non-Sterile 4x4 in (Generic) 1 x Per Day/7 Days Discharge Instructions: Apply over primary dressing as directed. Secondary Dressing: ABD Pad, 5x9 1 x Per Day/7 Days Discharge Instructions: Apply over primary dressing as directed. Secured With: 225M Medipore H Soft Cloth Surgical T 4 x 2 (in/yd) (Generic) 1 x Per Day/7 Days ape Discharge Instructions: Secure dressing with tape as directed. Wound #2 - Abdomen - Lower Quadrant Wound Laterality: Right, Lateral, Distal Cleanser: Soap and Water 1 x Per Day/7 Days Discharge Instructions: May shower and wash wound with dial antibacterial soap and water prior to dressing change. Cleanser: Normal Saline (Generic) 1 x Per Day/7 Days Discharge Instructions: Cleanse the wound with Normal Saline prior to applying a clean dressing using gauze sponges, not tissue or cotton balls. Prim Dressing: KerraCel Ag Gelling Fiber Dressing, 4x5 in (silver alginate) (Generic) 1 x Per Day/7 Days ary Discharge Instructions: Apply silver alginate to wound bed as instructed Secondary Dressing: Woven Gauze Sponge, Non-Sterile 4x4 in (Generic) 1 x Per Day/7 Days Discharge Instructions: Apply over primary dressing as directed. Secondary Dressing: ABD Pad, 5x9 1 x Per Day/7 Days Discharge Instructions: Apply over primary dressing as directed. Secured With: 225M Medipore H  Soft Cloth Surgical T 4 x 2 (in/yd) (Generic) 1 x Per Day/7 Days ape Discharge Instructions: Secure dressing with tape as directed. Electronic Signature(s) Signed: 10/23/2020 5:22:09 PM By: Travis Ham MD Signed: 10/26/2020 5:09:47 PM By: Rhae Hammock RN Entered By: Rhae Hammock on 10/23/2020 12:09:56 -------------------------------------------------------------------------------- Problem List Details Patient Name: Date of Service: [name]. 10/23/2020 10:45 A M Medical Record Number: [id number] Patient Account Number: [id number] Date of  Birth/Sex: Treating RN: 01-30-1931 (87 y.o. [name], [name] Primary Care Provider: Marlana Adkins Other Clinician: Referring Provider: Treating Provider/Extender: [name] , [name] in Treatment: 2 Active Problems ICD-10 Encounter Code Description Active Date MDM Diagnosis S31.103D Unspecified open wound of abdominal wall, right lower quadrant without 10/09/2020 No Yes penetration into peritoneal cavity, subsequent encounter Inactive Problems Resolved Problems Electronic Signature(s) Signed: 10/23/2020 5:22:09 PM By: Travis Ham MD Entered By: [name] on 10/23/2020 12:49:51 -------------------------------------------------------------------------------- Progress Note Details Patient Name: Date of Service: [name]. 10/23/2020 10:45 A M Medical Record Number: [id number] Patient Account Number: [id number] Date of Birth/Sex: Treating RN: 01-Oct-1930 (87 y.o. [name] Primary Care Provider: Marlana Adkins Other Clinician: Referring Provider: Treating Provider/Extender: [name] , [name] in Treatment: 2 Subjective History of Present Illness (HPI) ADMISSION 10/09/2020 This is a pleasant 57 year old man from assisted living at [facility] [place] accompanied by his wife. They are here for a many month progression of the wounds in the right lower abdominal  pannus fold. This initially was noted apparently in June 2021. There is some suggestion that he may have scratched this area and opened the wound but the patient does not remember doing this. They were using silver alginate in this area for a while but more recently a hydrocolloid. The area is not painful. There is also suggestion that they are using nystatin for a while however they are not doing this currently. The patient is fairly adamant that he is not allowing his waistband of his clothing to rub on this area Past medical history; idiopathic peripheral neuropathy, osteoarthritis, BPH, falls, lumbar spinal stenosis, sleep apnea, small vessel DVT hypertension, lower , extremity edema. The patient walks for short distances with a walker the rest of the time he is in a wheelchair. They moved into the assisted living order a friend[facility] home [place] about 9 months ago previously were at independent level 2/15; right lower abdominal pannus fold wound. This was a bit atypical last week but I elected to treat this is the usual friction type wound in the pannus fold using silver alginate offloading the area etc. He had one larger area and a smaller area both of these seem better and surface area has improved Objective Constitutional Sitting or standing Blood Pressure is within target range for patient.. Pulse regular and within target range for patient.Marland Kitchen Respirations regular, non-labored and within target range.. Temperature is normal and within the target range for the patient.Marland Kitchen Appears in no distress. Vitals Time Taken: 11:37 AM, Height: 69 in, Weight: 222 lbs, BMI: 32.8, Temperature: 97.6 F, Pulse: 68 bpm, Respiratory Rate: 18 breaths/min, Blood Pressure: 131/68 mmHg. General Notes: Wound exam; the area is on the right lower quadrant just above the inguinal fold. More substantial area is smaller and the surface looks better. There is rims of epithelialization the smaller one medially looks as though  it is just about closing. The skin in the area looks quite good I used silver nitrate on both wound areas because of the friable surface Integumentary (Hair, Skin) Wound #1 status is Open. Original cause of wound was Gradually Appeared. The wound is located on the Right,Proximal,Lateral Abdomen - Lower Quadrant. The wound measures 1.8cm length x 5.6cm width x 0.1cm depth; 7.917cm^2 area and 0.792cm^3 volume. There is Fat Layer (Subcutaneous Tissue) exposed. There is no tunneling or undermining noted. There is a medium amount of serosanguineous drainage noted. The wound margin is flat and intact. There is  large (67-100%) red, friable granulation within the wound bed. There is no necrotic tissue within the wound bed. Wound #2 status is Open. Original cause of wound was Gradually Appeared. The wound is located on the Right,Distal,Lateral Abdomen - Lower Quadrant. The wound measures 0.6cm length x 0.3cm width x 0.1cm depth; 0.141cm^2 area and 0.014cm^3 volume. There is Fat Layer (Subcutaneous Tissue) exposed. There is no tunneling or undermining noted. There is a medium amount of serosanguineous drainage noted. The wound margin is flat and intact. There is large (67-100%) pink granulation within the wound bed. There is no necrotic tissue within the wound bed. Assessment Active Problems ICD-10 Unspecified open wound of abdominal wall, right lower quadrant without penetration into peritoneal cavity, subsequent encounter Procedures Wound #1 Pre-procedure diagnosis of Wound #1 is an Abrasion located on the Right,Proximal,Lateral Abdomen - Lower Quadrant . An Chemical Cauterization procedure was performed by [name]., MD. Post procedure Diagnosis Wound #1: Same as Pre-Procedure Notes: silver nitrate Wound #2 Pre-procedure diagnosis of Wound #2 is an Abrasion located on the Right,Distal,Lateral Abdomen - Lower Quadrant . An Chemical Cauterization procedure was performed by [name].,  MD. Post procedure Diagnosis Wound #2: Same as Pre-Procedure Notes: silver nitrate Plan Follow-up Appointments: Return Appointment in 2 Adkins. Bathing/ Shower/ Hygiene: May shower with protection but do not get wound dressing(s) wet. - May use soap and water in the shower Off-Loading: Turn and reposition every 2 hours WOUND #1: - Abdomen - Lower Quadrant Wound Laterality: Right, Lateral, Proximal Cleanser: Soap and Water 1 x Per Day/7 Days Discharge Instructions: May shower and wash wound with dial antibacterial soap and water prior to dressing change. Cleanser: Normal Saline (Generic) 1 x Per Day/7 Days Discharge Instructions: Cleanse the wound with Normal Saline prior to applying a clean dressing using gauze sponges, not tissue or cotton balls. Prim Dressing: KerraCel Ag Gelling Fiber Dressing, 4x5 in (silver alginate) (Generic) 1 x Per Day/7 Days ary Discharge Instructions: Apply silver alginate to wound bed as instructed Secondary Dressing: Woven Gauze Sponge, Non-Sterile 4x4 in (Generic) 1 x Per Day/7 Days Discharge Instructions: Apply over primary dressing as directed. Secondary Dressing: ABD Pad, 5x9 1 x Per Day/7 Days Discharge Instructions: Apply over primary dressing as directed. Secured With: 44M Medipore H Soft Cloth Surgical T 4 x 2 (in/yd) (Generic) 1 x Per Day/7 Days ape Discharge Instructions: Secure dressing with tape as directed. WOUND #2: - Abdomen - Lower Quadrant Wound Laterality: Right, Lateral, Distal Cleanser: Soap and Water 1 x Per Day/7 Days Discharge Instructions: May shower and wash wound with dial antibacterial soap and water prior to dressing change. Cleanser: Normal Saline (Generic) 1 x Per Day/7 Days Discharge Instructions: Cleanse the wound with Normal Saline prior to applying a clean dressing using gauze sponges, not tissue or cotton balls. Prim Dressing: KerraCel Ag Gelling Fiber Dressing, 4x5 in (silver alginate) (Generic) 1 x Per Day/7  Days ary Discharge Instructions: Apply silver alginate to wound bed as instructed Secondary Dressing: Woven Gauze Sponge, Non-Sterile 4x4 in (Generic) 1 x Per Day/7 Days Discharge Instructions: Apply over primary dressing as directed. Secondary Dressing: ABD Pad, 5x9 1 x Per Day/7 Days Discharge Instructions: Apply over primary dressing as directed. Secured With: 44M Medipore H Soft Cloth Surgical T 4 x 2 (in/yd) (Generic) 1 x Per Day/7 Days ape Discharge Instructions: Secure dressing with tape as directed. 1. After silver nitrate I am continuing with the silver alginate. The wounds both look better. 2. I had some thoughts  about biopsying this when I saw 2 Adkins ago I am less convinced now that that will be necessary 3. No evidence of infection Electronic Signature(s) Signed: 10/23/2020 5:22:09 PM By: Travis Ham MD Entered By: [name] on 10/23/2020 12:57:59 -------------------------------------------------------------------------------- SuperBill Details Patient Name: Date of Service: [name] 10/23/2020 Medical Record Number: [id number] Patient Account Number: [id number] Date of Birth/Sex: Treating RN: 05-Mar-1931 (87 y.o. [name] Primary Care Provider: Marlana Adkins Other Clinician: Referring Provider: Treating Provider/Extender: [name] , [name] in Treatment: 2 Diagnosis Coding ICD-10 Codes Code Description S31.103D Unspecified open wound of abdominal wall, right lower quadrant without penetration into peritoneal cavity, subsequent encounter Facility Procedures CPT4 Code: [id number] Description: 99214 - WOUND CARE VISIT-LEV 4 EST PT Modifier: Quantity: 1 Physician Procedures : CPT4 Code Description Modifier [id number] - WC PHYS LEVEL 3 - EST PT ICD-10 Diagnosis Description S31.103D Unspecified open wound of abdominal wall, right lower quadrant without penetration into peritoneal cavi subsequent encounter Quantity: 1  ty, Electronic Signature(s) Signed: 10/23/2020 5:22:09 PM By: Travis Ham MD Entered By: [name] on 10/23/2020 12:58:16

## 2020-10-26 NOTE — Progress Notes (Signed)
NASER, SCHULD ([id number]) Visit Report for 10/23/2020 Arrival Information Details Patient Name: Date of Service: [name], [name] 10/23/2020 10:45 A M Medical Record Number: [id number] Patient Account Number: [id number] Date of Birth/Sex: Treating RN: 03-Nov-1930 (87 y.o. [name] Primary Care [name]: [name] Other Clinician: Referring [name]: Treating [name]/Extender: [name] , [name]: 2 Visit Information History Since Last Visit Added or deleted any medications: No Patient Arrived: Ambulatory Any new allergies or adverse reactions: No Arrival Time: 11:37 Had a fall or experienced change in No Accompanied By: wife activities of daily living that may affect Transfer Assistance: None risk of falls: Patient Identification Verified: Yes Signs or symptoms of abuse/neglect since last visito No Secondary Verification Process Completed: Yes Hospitalized since last visit: No Patient Requires Transmission-Based Precautions: No Implantable device outside of the clinic excluding No Patient Has Alerts: No cellular tissue based products placed in the center since last visit: Has Dressing in Place as Prescribed: Yes Pain Present Now: No Electronic Signature(s) Signed: 10/23/2020 5:45:50 PM By: Levan Hurst RN, BSN Entered By: Levan Hurst on 10/23/2020 11:37:30 -------------------------------------------------------------------------------- Clinic Level of Care Assessment Details Patient Name: Date of Service: [name], [name] 10/23/2020 10:45 A M Medical Record Number: [id number] Patient Account Number: [id number] Date of Birth/Sex: Treating RN: 1931/09/03 (87 y.o. [name], [name] Primary Care Eashan Schipani: [name] Other Clinician: Referring [name]: Treating [name]/Extender: [name] , [name]: 2 Clinic Level of Care Assessment Items TOOL 3 Quantity Score X- 1 0 Use when EandM  and Procedure is performed on FOLLOW-UP visit ASSESSMENTS - Nursing Assessment / Reassessment X- 1 10 Reassessment of Co-morbidities (includes updates in patient status) X- 1 5 Reassessment of Adherence to Treatment Plan ASSESSMENTS - Wound and Skin Assessment / Reassessment []  - Points for Wound Assessment can only be taken for a new wound of unknown or different etiology and a procedure is 0 NOT performed to that wound X- 1 5 Simple Wound Assessment / Reassessment - one wound []  - 0 Complex Wound Assessment / Reassessment - multiple wounds X- 1 10 Dermatologic / Skin Assessment (not related to wound area) ASSESSMENTS - Focused Assessment []  - 0 Circumferential Edema Measurements - multi extremities []  - 0 Nutritional Assessment / Counseling / Intervention []  - 0 Lower Extremity Assessment (monofilament, tuning fork, pulses) []  - 0 Peripheral Arterial Disease Assessment (using hand held doppler) ASSESSMENTS - Ostomy and/or Continence Assessment and Care []  - 0 Incontinence Assessment and Management []  - 0 Ostomy Care Assessment and Management (repouching, etc.) PROCESS - Coordination of Care []  - Points for Discharge Coordination can only be taken for a new wound of unknown or different etiology and a procedure 0 is NOT performed to that wound X- 1 15 Simple Patient / Family Education for ongoing care []  - 0 Complex (extensive) Patient / Family Education for ongoing care X- 1 10 Staff obtains Programmer, systems, Records, T Results / Process Orders est X- 1 10 Staff telephones HHA, Nursing Homes / Clarify orders / etc []  - 0 Routine Transfer to another Facility (non-emergent condition) []  - 0 Routine Hospital Admission (non-emergent condition) []  - 0 New Admissions / Biomedical engineer / Ordering NPWT Apligraf, etc. , []  - 0 Emergency Hospital Admission (emergent condition) X- 1 10 Simple Discharge Coordination []  - 0 Complex (extensive) Discharge  Coordination PROCESS - Special Needs []  - 0 Pediatric / Minor Patient Management []  - 0 Isolation Patient Management []  - 0  Hearing / Language / Visual special needs []  - 0 Assessment of Community assistance (transportation, D/C planning, etc.) []  - 0 Additional assistance / Altered mentation []  - 0 Support Surface(s) Assessment (bed, cushion, seat, etc.) INTERVENTIONS - Wound Cleansing / Measurement []  - Points for Wound Cleaning / Measurement, Wound Dressing, Specimen Collection and Specimen taken to lab can only 0 be taken for a new wound of unknown or different etiology and a procedure is NOT performed to that wound []  - 0 Simple Wound Cleansing - one wound X- 2 5 Complex Wound Cleansing - multiple wounds X- 1 5 Wound Imaging (photographs - any number of wounds) []  - 0 Wound Tracing (instead of photographs) []  - 0 Simple Wound Measurement - one wound X- 2 5 Complex Wound Measurement - multiple wounds INTERVENTIONS - Wound Dressings []  - 0 Small Wound Dressing one or multiple wounds X- 2 15 Medium Wound Dressing one or multiple wounds []  - 0 Large Wound Dressing one or multiple wounds INTERVENTIONS - Miscellaneous []  - 0 External ear exam []  - 0 Specimen Collection (cultures, biopsies, blood, body fluids, etc.) []  - 0 Specimen(s) / Culture(s) sent or taken to Lab for analysis []  - 0 Patient Transfer (multiple staff / Civil Service fast streamer / Similar devices) []  - 0 Simple Staple / Suture removal (25 or less) []  - 0 Complex Staple / Suture removal (26 or more) []  - 0 Hypo / Hyperglycemic Management (close monitor of Blood Glucose) []  - 0 Ankle / Brachial Index (ABI) - do not check if billed separately X- 1 5 Vital Signs Has the patient been seen at the hospital within the last three years: Yes Total Score: 135 Level Of Care: New/Established - Level 4 Electronic Signature(s) Signed: 10/26/2020 5:09:47 PM By: Rhae Hammock RN Entered By: Rhae Hammock on  10/23/2020 12:12:03 -------------------------------------------------------------------------------- Encounter Discharge Information Details Patient Name: Date of Service: Travis Costain C. 10/23/2020 10:45 A M Medical Record Number: [id number] Patient Account Number: [id number] Date of Birth/Sex: Treating RN: 1930-10-14 (87 y.o. [name] Primary Care Idamae Coccia: [name] Other Clinician: Referring Carole Deere: Treating Amit Meloy/Extender: [name] , [name]: 2 Encounter Discharge Information Items Discharge Condition: Stable Ambulatory Status: Wheelchair Discharge Destination: Home Transportation: Private Auto Accompanied By: wife Schedule Follow-up Appointment: Yes Clinical Summary of Care: Patient Declined Electronic Signature(s) Signed: 10/23/2020 5:45:50 PM By: Levan Hurst RN, BSN Entered By: Levan Hurst on 10/23/2020 17:25:39 -------------------------------------------------------------------------------- Multi Wound Chart Details Patient Name: Date of Service: Travis Crouch. 10/23/2020 10:45 A M Medical Record Number: [id number] Patient Account Number: [id number] Date of Birth/Sex: Treating RN: 12-Jul-1931 (87 y.o. [name] Primary Care Kelvon Giannini: [name] Other Clinician: Referring Corene Resnick: Treating Darianny Momon/Extender: [name] , [name]: 2 Vital Signs Height(in): 69 Pulse(bpm): 68 Weight(lbs): 222 Blood Pressure(mmHg): 131/68 Body Mass Index(BMI): 33 Temperature(F): 97.6 Respiratory Rate(breaths/min): 18 Photos: [1:No Photos Right, Lateral Abdomen - Lower] [2:No Photos Right Abdomen - Lower Quadrant] [N/A:N/A N/A] Wound Location: [1:Quadrant Gradually Appeared] [2:Gradually Appeared] [N/A:N/A] Wounding Event: [1:Abrasion] [2:Abrasion] [N/A:N/A] Primary Etiology: [1:Cataracts, Anemia, Sleep Apnea,] [2:Cataracts, Anemia, Sleep Apnea,] [N/A:N/A] Comorbid History:  [1:Hypertension, Osteoarthritis, Neuropathy 02/07/2020] [2:Hypertension, Osteoarthritis, Neuropathy 09/24/2020] [N/A:N/A] Date Acquired: [1:2] [2:2] [N/A:N/A] Weeks of Treatment: [1:Open] [2:Open] [N/A:N/A] Wound Status: [1:1.8x5.6x0.1] [2:0.6x0.3x0.1] [N/A:N/A] Measurements L x W x D (cm) [1:7.917] [2:0.141] [N/A:N/A] A (cm) : rea [1:0.792] [2:0.014] [N/A:N/A] Volume (cm) : [1:40.00%] [2:84.90%] [N/A:N/A] % Reduction in Area: [1:40.00%] [2:84.90%] [N/A:N/A] % Reduction in Volume: [1:Full  Thickness Without Exposed] [2:Full Thickness Without Exposed] [N/A:N/A] Classification: [1:Support Structures Medium] [2:Support Structures Medium] [N/A:N/A] Exudate Amount: [1:Serosanguineous] [2:Serosanguineous] [N/A:N/A] Exudate Type: [1:red, brown] [2:red, brown] [N/A:N/A] Exudate Color: [1:Flat and Intact] [2:Flat and Intact] [N/A:N/A] Wound Margin: [1:Large (67-100%)] [2:Large (67-100%)] [N/A:N/A] Granulation Amount: [1:Red, Friable] [2:Pink] [N/A:N/A] Granulation Quality: [1:None Present (0%)] [2:None Present (0%)] [N/A:N/A] Necrotic Amount: [1:Fat Layer (Subcutaneous Tissue): Yes Fat Layer (Subcutaneous Tissue): Yes N/A] Exposed Structures: [1:Fascia: No Tendon: No Muscle: No Joint: No Bone: No Small (1-33%)] [2:Fascia: No Tendon: No Muscle: No Joint: No Bone: No Medium (34-66%)] [N/A:N/A] Epithelialization: [1:Chemical Cauterization] [2:Chemical Cauterization] [N/A:N/A] Treatment Notes Electronic Signature(s) Signed: 10/23/2020 5:22:09 PM By: Linton Ham MD Signed: 10/26/2020 5:09:47 PM By: Rhae Hammock RN Entered By: Linton Ham on 10/23/2020 12:49:58 -------------------------------------------------------------------------------- Multi-Disciplinary Care Plan Details Patient Name: Date of Service: Travis Crouch. 10/23/2020 10:45 A M Medical Record Number: [id number] Patient Account Number: [id number] Date of Birth/Sex: Treating RN: 04-01-1931 (87 y.o. [name],  [name] Primary Care Windie Marasco: [name] Other Clinician: Referring Angie Hogg: Treating Jaryn Rosko/Extender: [name] , [name]: 2 Active Inactive Orientation to the Wound Care Program Nursing Diagnoses: Knowledge deficit related to the wound healing center program Goals: Patient/caregiver will verbalize understanding of the [place] Date Initiated: 10/09/2020 Target Resolution Date: 11/16/2020 Goal Status: Active Interventions: Provide education on orientation to the wound center Notes: Wound/Skin Impairment Nursing Diagnoses: Impaired tissue integrity Knowledge deficit related to ulceration/compromised skin integrity Goals: Patient will have a decrease in wound volume by X% from date: (specify in notes) Date Initiated: 10/09/2020 Target Resolution Date: 11/16/2020 Goal Status: Active Patient/caregiver will verbalize understanding of skin care regimen Date Initiated: 10/09/2020 Target Resolution Date: 11/09/2020 Goal Status: Active Ulcer/skin breakdown will have a volume reduction of 30% by week 4 Date Initiated: 10/09/2020 Target Resolution Date: 11/09/2020 Goal Status: Active Interventions: Assess patient/caregiver ability to obtain necessary supplies Assess patient/caregiver ability to perform ulcer/skin care regimen upon admission and as needed Assess ulceration(s) every visit Notes: Electronic Signature(s) Signed: 10/26/2020 5:09:47 PM By: Rhae Hammock RN Entered By: Rhae Hammock on 10/23/2020 12:10:03 -------------------------------------------------------------------------------- Pain Assessment Details Patient Name: Date of Service: Travis Crouch 10/23/2020 10:45 A M Medical Record Number: [id number] Patient Account Number: [id number] Date of Birth/Sex: Treating RN: [birth year] (87 y.o. [name] Primary Care Emonni Depasquale: [name] Other Clinician: Referring Yousif Edelson: Treating Ronnette Rump/Extender:  [name] , [name]: 2 Active Problems Location of Pain Severity and Description of Pain Patient Has Paino No Site Locations Pain Management and Medication Current Pain Management: Electronic Signature(s) Signed: 10/23/2020 5:45:50 PM By: Levan Hurst RN, BSN Entered By: Levan Hurst on 10/23/2020 11:37:54 -------------------------------------------------------------------------------- Patient/Caregiver Education Details Patient Name: Date of Service: Travis Crouch 2/15/2022andnbsp10:45 A M Medical Record Number: [id number] Patient Account Number: [id number] Date of Birth/Gender: Treating RN: February 09, 1931 (87 y.o. [name] Primary Care Physician: [name] Other Clinician: Referring Physician: Treating Physician/Extender: [name] , [name]: 2 Education Assessment Education Provided To: Patient Education Topics Provided Welcome T [employer]: o Methods: Explain/Verbal Electronic Signature(s) Signed: 10/26/2020 5:09:47 PM By: Rhae Hammock RN Entered By: Rhae Hammock on 10/23/2020 12:10:16 -------------------------------------------------------------------------------- Wound Assessment Details Patient Name: Date of Service: Travis Crouch 10/23/2020 10:45 A M Medical Record Number: [id number] Patient Account Number: [id number] Date of Birth/Sex: Treating RN: [birth year] (87 y.o. [name] Primary Care Jearl Soto: [name] Other Clinician: Referring Renesme Kerrigan:  Treating Tahmir Kleckner/Extender: [name] , [name]: 2 Wound Status Wound Number: 1 Primary Abrasion Etiology: Wound Location: Right, Lateral Abdomen - Lower Quadrant Wound Status: Open Wounding Event: Gradually Appeared Comorbid Cataracts, Anemia, Sleep Apnea, Hypertension, Osteoarthritis, Date Acquired: 02/07/2020 History: Neuropathy Weeks Of Treatment: 2 Clustered  Wound: No Photos Photo Uploaded By: Mikeal Hawthorne on 10/24/2020 14:34:24 Wound Measurements Length: (cm) 1.8 Width: (cm) 5.6 Depth: (cm) 0.1 Area: (cm) 7.917 Volume: (cm) 0.792 % Reduction in Area: 40% % Reduction in Volume: 40% Epithelialization: Small (1-33%) Tunneling: No Undermining: No Wound Description Classification: Full Thickness Without Exposed Support Structures Wound Margin: Flat and Intact Exudate Amount: Medium Exudate Type: Serosanguineous Exudate Color: red, brown Foul Odor After Cleansing: No Slough/Fibrino No Wound Bed Granulation Amount: Large (67-100%) Exposed Structure Granulation Quality: Red, Friable Fascia Exposed: No Necrotic Amount: None Present (0%) Fat Layer (Subcutaneous Tissue) Exposed: Yes Tendon Exposed: No Muscle Exposed: No Joint Exposed: No Bone Exposed: No Electronic Signature(s) Signed: 10/23/2020 5:45:50 PM By: Levan Hurst RN, BSN Entered By: Levan Hurst on 10/23/2020 11:39:26 -------------------------------------------------------------------------------- Wound Assessment Details Patient Name: Date of Service: Travis Crouch. 10/23/2020 10:45 A M Medical Record Number: [id number] Patient Account Number: [id number] Date of Birth/Sex: Treating RN: Nov 01, 1930 (87 y.o. [name] Primary Care Mayumi Summerson: [name] Other Clinician: Referring Raevyn Sokol: Treating Yaquelin Langelier/Extender: [name] , [name]: 2 Wound Status Wound Number: 2 Primary Abrasion Etiology: Wound Location: Right Abdomen - Lower Quadrant Wound Status: Open Wounding Event: Gradually Appeared Comorbid Cataracts, Anemia, Sleep Apnea, Hypertension, Osteoarthritis, Date Acquired: 09/24/2020 History: Neuropathy Weeks Of Treatment: 2 Clustered Wound: No Photos Photo Uploaded By: Mikeal Hawthorne on 10/24/2020 14:34:24 Wound Measurements Length: (cm) 0.6 Width: (cm) 0.3 Depth: (cm) 0.1 Area: (cm) 0.141 Volume:  (cm) 0.014 % Reduction in Area: 84.9% % Reduction in Volume: 84.9% Epithelialization: Medium (34-66%) Tunneling: No Undermining: No Wound Description Classification: Full Thickness Without Exposed Support Structures Wound Margin: Flat and Intact Exudate Amount: Medium Exudate Type: Serosanguineous Exudate Color: red, brown Foul Odor After Cleansing: No Slough/Fibrino No Wound Bed Granulation Amount: Large (67-100%) Exposed Structure Granulation Quality: Pink Fascia Exposed: No Necrotic Amount: None Present (0%) Fat Layer (Subcutaneous Tissue) Exposed: Yes Tendon Exposed: No Muscle Exposed: No Joint Exposed: No Bone Exposed: No Electronic Signature(s) Signed: 10/23/2020 5:45:50 PM By: Levan Hurst RN, BSN Entered By: Levan Hurst on 10/23/2020 11:39:37 -------------------------------------------------------------------------------- Vitals Details Patient Name: Date of Service: Travis Costain C. 10/23/2020 10:45 A M Medical Record Number: [id number] Patient Account Number: [id number] Date of Birth/Sex: Treating RN: Dec 14, 1930 (87 y.o. [name] Primary Care [place]: [name] Other Clinician: Referring Tobe Kervin: Treating Alphonza Tramell/Extender: [name] , [name]: 2 Vital Signs Time Taken: 11:37 Temperature (F): 97.6 Height (in): 69 Pulse (bpm): 68 Weight (lbs): 222 Respiratory Rate (breaths/min): 18 Body Mass Index (BMI): 32.8 Blood Pressure (mmHg): 131/68 Reference Range: 80 - 120 mg / dl Electronic Signature(s) Signed: 10/23/2020 5:45:50 PM By: Levan Hurst RN, BSN Entered By: Levan Hurst on 10/23/2020 11:37:48

## 2020-10-29 ENCOUNTER — Encounter: Payer: Self-pay | Admitting: Nurse Practitioner

## 2020-10-29 ENCOUNTER — Non-Acute Institutional Stay: Payer: Medicare PPO | Admitting: Nurse Practitioner

## 2020-10-29 DIAGNOSIS — K5900 Constipation, unspecified: Secondary | ICD-10-CM

## 2020-10-29 DIAGNOSIS — I872 Venous insufficiency (chronic) (peripheral): Secondary | ICD-10-CM

## 2020-10-29 DIAGNOSIS — R600 Localized edema: Secondary | ICD-10-CM

## 2020-10-29 DIAGNOSIS — M8949 Other hypertrophic osteoarthropathy, multiple sites: Secondary | ICD-10-CM

## 2020-10-29 DIAGNOSIS — M150 Primary generalized (osteo)arthritis: Secondary | ICD-10-CM

## 2020-10-29 DIAGNOSIS — R350 Frequency of micturition: Secondary | ICD-10-CM | POA: Diagnosis not present

## 2020-10-29 DIAGNOSIS — K219 Gastro-esophageal reflux disease without esophagitis: Secondary | ICD-10-CM

## 2020-10-29 DIAGNOSIS — F419 Anxiety disorder, unspecified: Secondary | ICD-10-CM | POA: Diagnosis not present

## 2020-10-29 DIAGNOSIS — E039 Hypothyroidism, unspecified: Secondary | ICD-10-CM | POA: Diagnosis not present

## 2020-10-29 DIAGNOSIS — M159 Polyosteoarthritis, unspecified: Secondary | ICD-10-CM

## 2020-10-29 DIAGNOSIS — G6289 Other specified polyneuropathies: Secondary | ICD-10-CM | POA: Diagnosis not present

## 2020-10-29 NOTE — Progress Notes (Signed)
Location:   [facility] Room Number: [place] of Service:  ALF (13) Provider:  [name] NP  Varian Innes X, NP  Patient Care Team: Janssen Zee X, NP as PCP - General (Internal Medicine) [name], [name], MD as Consulting Physician (Orthopedic Surgery) [name], MD as Consulting Physician (Gastroenterology) System, Provider Not In [name], MD as Consulting Physician (Neurosurgery) [name], MD as Referring Physician (Ophthalmology) Shakura Cowing X, NP as Nurse Practitioner (Internal Medicine) Almedia Balls, MD as Referring Physician (Orthopedic Surgery) [name], MD as Consulting Physician (Neurosurgery) Elsie Saas, MD as Consulting Physician (Orthopedic Surgery) Allyn Kenner, MD (Dermatology) Kathrynn Ducking, MD as Consulting Physician (Neurology) Franchot Gallo, MD as Consulting Physician (Urology)  Extended Emergency Contact Information Primary Emergency Contact: [name] Address: [address]          [place]          [place],  [address] [place] Phone: [phone] Relation: Spouse  Code Status:  Full Code Managed Care Goals of care: Advanced Directive information Advanced Directives 07/11/2020  Does Patient Have a Medical Advance Directive? Yes  Type of Paramedic of [place];Living will;Out of facility DNR (pink MOST or yellow form)  Does patient want to make changes to medical advance directive? No - Patient declined  Copy of [place] in Chart? Yes - validated most recent copy scanned in chart (See row information)  Pre-existing out of facility DNR order (yellow form or pink MOST form) Pink MOST form placed in chart (order not valid for inpatient use)     Chief Complaint  Patient presents with  . Acute Visit    Swelling legs    HPI:  Pt is a 87 y.o. male seen today for an acute visit for increases swelling in leg, mild  erythematous shins, no pain in calf with R+L feet dorsiflexion.   Peripheral neuropathy, weakness in legs, w/c for mobility, on Gabapentin 100mg  qd. Takes Vit B12. Had nerve conduction study 03/10/18             R groin pressure ulcer, saw Wound Care specialist, treated with Silver Alginate.              Anxiety, psych consult 08/09/20, the patient declined recommended Depakote. TSH 5.76 10/19/20 Left knee OA, takign Mobic 15 mg qd since 05/01/20, X-ray R hip/knee, f/u Ortho Urinary frequency, stable, on Tamsulosin.underwent Urology evaluation in the past.  Hypothyroidism, takes Levothyroxine, TSH 5.76 10/19/20 Edema BLE, takes Furosemide, Bun/creat 19/0.8 10/19/20 Constipation, takes Senokot S,  GERD, takes Omeprazole 20mg  qd. Hgb 12.5 10/19/20   Past Medical History:  Diagnosis Date  . Abnormal MRI, knee    Per [facility] New Patient Packet   . Anemia   . Anticoagulated   . Anxiety   . Arthritis   . Cervical disc disorder    Per [place] Patient Packet   . Complication of anesthesia    states "I was [place] several weeks after surgery"caused by tramadol  . Constipation   . Dysuria 07/28/2016  . Edema 07/24/2016  . Gait abnormality 07/25/2016  . Hard of hearing   . Head injury    As a result of a fall, Per [facility] New Patient Packet   . Hip arthritis 07/21/2016  . History of fall 07/25/2016  . Hypercholesteremia   . Hypertension   . Lumbar spinal stenosis   . Memory loss 07/28/2016  mild  . Obesity    Per [facility] New Patient Packet   . Osteoarthritis of right hip 07/24/2016  . Peripheral neuropathy 03/10/2018  . Sleep apnea    cpap  . Spinal stenosis of lumbar region 05/28/2016  . Urinary frequency   . Venous thrombosis of leg 07/24/2016   right gastrocnemius  . Vertigo   . Wears glasses   . Weight loss 04/08/2016   Past Surgical History:  Procedure Laterality Date  . BACK SURGERY  10/17, 80's  .  COLONOSCOPY    . COLONOSCOPY     Per [facility] New Patient Packet, [place]   . ESOPHAGOGASTRODUODENOSCOPY    . LUMBAR LAMINECTOMY/DECOMPRESSION MICRODISCECTOMY Right 05/28/2016   Procedure: Right Lumbar Three-Four Microdiscectomy;  Surgeon: [name], MD;  Location: [facility];  Service: Neurosurgery;  Laterality: Right;  . TOTAL HIP ARTHROPLASTY Right 07/21/2016   Procedure: TOTAL HIP ARTHROPLASTY ANTERIOR APPROACH;  Surgeon: [name], MD;  Location: [place];  Service: Orthopedics;  Laterality: Right;    Allergies  Allergen Reactions  . Tramadol Other (See Comments)    "does not eat" LOSS OF APPETITE    Allergies as of 10/29/2020      Reactions   Tramadol Other (See Comments)   "does not eat" LOSS OF APPETITE      Medication List       Accurate as of October 29, 2020 11:59 PM. If you have any questions, ask your nurse or doctor.        acetaminophen 500 MG tablet Commonly known as: TYLENOL Take 1,000 mg by mouth every 8 (eight) hours as needed.   aspirin EC 81 MG tablet Take 81 mg by mouth daily. 8am.   atorvastatin 20 MG tablet Commonly known as: LIPITOR Take 20 mg by mouth daily. 8pm.   B-12 1000 MCG Tabs Take 1 tablet by mouth every other day. 8am.   CoQ10 100 MG Caps Take 1 capsule by mouth daily. 8am.   diphenhydrAMINE 25 MG tablet Commonly known as: BENADRYL Take 25 mg by mouth as needed.   fexofenadine 180 MG tablet Commonly known as: ALLEGRA Take 180 mg by mouth daily as needed for allergies or rhinitis.   fluticasone 50 MCG/ACT nasal spray Commonly known as: FLONASE Place 2 sprays into both nostrils as needed.   furosemide 20 MG tablet Commonly known as: LASIX Take 20 mg by mouth. Mon, Wed, Fri   gabapentin 100 MG capsule Commonly known as: NEURONTIN Take 100 mg by mouth at bedtime. 8pm.   Glucosamine 500 MG Caps Take by mouth. Take 3 pills to equal 1500mg  daily.   hydrocortisone 2.5 % cream Apply topically 2 (two) times daily  as needed.   ketoconazole 2 % cream Commonly known as: NIZORAL Apply 1 application topically 2 (two) times daily as needed for irritation.   levothyroxine 50 MCG tablet Commonly known as: SYNTHROID Take 50 mcg by mouth daily before breakfast.   meclizine 25 MG tablet Commonly known as: ANTIVERT Take 25 mg by mouth 3 (three) times daily as needed for dizziness.   meloxicam 15 MG tablet Commonly known as: MOBIC Take 15 mg by mouth daily.   NAT-RUL PSYLLIUM SEED HUSKS PO Take 1 tablet by mouth as needed. For constipation.   omeprazole 20 MG capsule Commonly known as: PRILOSEC Take 20 mg by mouth daily.   potassium chloride 10 MEQ tablet Commonly known as: KLOR-CON Take 10 mEq by mouth daily.   sennosides-docusate sodium 8.6-50 MG tablet Commonly known  as: SENOKOT-S Take 1 tablet by mouth daily.   sodium chloride 0.65 % Soln nasal spray Commonly known as: OCEAN Place 2 sprays into both nostrils as needed for congestion.   tamsulosin 0.4 MG Caps capsule Commonly known as: FLOMAX Take 0.4 mg by mouth at bedtime.       Review of Systems  Constitutional: Negative for fatigue, fever and unexpected weight change.  HENT: Positive for hearing loss. Negative for congestion and voice change.   Eyes: Negative for visual disturbance.  Respiratory: Negative for cough and shortness of breath.   Cardiovascular: Positive for leg swelling.  Gastrointestinal: Negative for abdominal pain and constipation.  Genitourinary: Positive for frequency. Negative for difficulty urinating and urgency.  Musculoskeletal: Positive for arthralgias, back pain and gait problem.       R+L knees, left shoulder, s/p cervical spine+lumber spien surgeries. Mostly pain is in the right knee presently.   Skin: Positive for wound. Negative for color change.  Neurological: Positive for weakness and numbness. Negative for speech difficulty.       Memory lapses. Tingling, numbness, weakness in legs.   Psychiatric/Behavioral: Negative for behavioral problems, dysphoric mood and sleep disturbance. The patient is nervous/anxious.        Irritable and mildly agitation at times, but sleeps well at night.     Immunization History  Administered Date(s) Administered  . Influenza-Unspecified 06/11/2015, 06/11/2019, 06/19/2020  . Moderna Sars-Covid-2 Vaccination 09/10/2019, 10/08/2019, 07/17/2020  . PFIZER(Purple Top)SARS-COV-2 Vaccination 09/10/2019  . Pneumococcal Polysaccharide-23 04/21/2011  . Pneumococcal-Unspecified 04/21/2019  . Td 04/22/2002  . Tetanus 05/16/2020  . Zoster 04/27/2012   Pertinent  Health Maintenance Due  Topic Date Due  . PNA vac Low Risk Adult (2 of 2 - PCV13) 04/20/2020  . INFLUENZA VACCINE  Completed   Fall Risk  03/24/2019 02/08/2018 07/28/2016  Falls in the past year? 0 Yes Yes  Number falls in past yr: 0 1 1  Injury with Fall? 0 No No  Risk for fall due to : - - History of fall(s);Impaired balance/gait;Impaired mobility  Follow up - - Falls evaluation completed   Functional Status Survey:    Vitals:   10/29/20 1616  BP: 132/60  Pulse: 70  Resp: 18  Temp: 98.7 F (37.1 C)  SpO2: 95%  Weight: 222 lb 12.8 oz (101.1 kg)  Height: 5\' 4"  (1.626 m)   Body mass index is 38.24 kg/m. Physical Exam Vitals and nursing note reviewed.  Constitutional:      Appearance: Normal appearance.  HENT:     Head: Normocephalic and atraumatic.     Mouth/Throat:     Mouth: Mucous membranes are moist.  Eyes:     Extraocular Movements: Extraocular movements intact.     Conjunctiva/sclera: Conjunctivae normal.     Pupils: Pupils are equal, round, and reactive to light.  Cardiovascular:     Rate and Rhythm: Normal rate and regular rhythm.     Heart sounds: No murmur heard.   Pulmonary:     Effort: Pulmonary effort is normal.     Breath sounds: No rales.  Abdominal:     General: Bowel sounds are normal.     Palpations: Abdomen is soft.     Tenderness: There is  no abdominal tenderness.  Musculoskeletal:     Cervical back: Normal range of motion and neck supple.     Right lower leg: Edema present.     Left lower leg: Edema present.     Comments: 3+ edema BLE. Left shoulder  pain with ROM is chronic. Chronic R+L knee pain-mostly in the left knee, knee support L knee. S/p R hips replacement. S/p cervical/lumbar spine surgeries.   Skin:    General: Skin is warm and dry.     Findings: Erythema present.     Comments: a slow healing pressure ulcer in the right groin skin fold is treated with silver alginate.  Mild erythema BLE, negative Homans sign.   Neurological:     General: No focal deficit present.     Mental Status: He is alert. Mental status is at baseline.     Motor: Weakness present.     Gait: Gait abnormal.     Comments: Moves slow, uses electric w/c for mobility,  lower body weakness, peripheral neuropathy in legs.   Psychiatric:        Mood and Affect: Mood normal.        Behavior: Behavior normal.        Thought Content: Thought content normal.     Comments: Oriented to person, place. Appears anxious or obsessed or repetitive with his health concerns.      Labs reviewed: Recent Labs    03/22/20 0000 09/07/20 0000 10/19/20 0000  NA 139 141 141  K 4.3 3.9 4.6  CL 104 105 106  CO2 29* 29* 27*  BUN 21 20 19   CREATININE 1.0 1.0 0.8  CALCIUM 8.6* 8.7 8.4*   Recent Labs    02/24/20 0000 09/07/20 0000 10/19/20 0000  AST 17 17 16   ALT 15 15 16   ALKPHOS 88 86 80  ALBUMIN 4.2 3.9 3.4*   Recent Labs    02/24/20 0000 09/07/20 0000 10/19/20 0000  WBC 5.3 5.1 4.4  NEUTROABS 3,222 3,545.00 2,794.00  HGB 13.8 13.4* 12.5*  HCT 42 40* 38*  PLT 171 170 148*   Lab Results  Component Value Date   TSH 5.76 10/19/2020   No results found for: HGBA1C Lab Results  Component Value Date   CHOL 139 02/24/2020   HDL 37 02/24/2020   LDLCALC 82 02/24/2020   TRIG 100 02/24/2020   CHOLHDL 5.1 (H) 03/31/2019    Significant  Diagnostic Results in last 30 days:  No results found.  Assessment/Plan Edema of both lower legs due to peripheral venous insufficiency increases swelling in leg, mild erythematous shins, no pain in calf with R+L feet dorsiflexion. Will increase Furosemide 20mg  qd, Kcl 69meq qd,  Bun/creat 19/0.8 10/19/20, update BMP one week. No echocardiogram located. Observe    GERD (gastroesophageal reflux disease) takes Omeprazole 20mg  qd. Hgb 12.5 10/19/20    Constipation takes Senokot S,    Hypothyroidism takes Levothyroxine, TSH 5.76 10/19/20   Urinary frequency  stable, on Tamsulosin.underwent Urology evaluation in the past.    Osteoarthritis, multiple sites Left knee OA, takign Mobic,  f/u Ortho   Anxiety Anxiety, psych consult 08/09/20, the patient declined recommended Depakote. TSH 5.76 10/19/20   Pressure ulcer of right hip, stage 2 (HCC) R groin pressure ulcer, saw Wound Care specialist, treated with Silver Alginate.   Axonal sensorimotor neuropathy Peripheral neuropathy, weakness in legs, w/c for mobility, on Gabapentin 100mg  qd. Takes Vit B12. Had nerve conduction study 03/10/18      Family/ staff Communication: plan of care reviewed with the patient and charge nurse.   Labs/tests ordered:  BMP one week.   Time spend 40 minutes.

## 2020-10-31 ENCOUNTER — Encounter: Payer: Self-pay | Admitting: Nurse Practitioner

## 2020-10-31 NOTE — Assessment & Plan Note (Signed)
Peripheral neuropathy, weakness in legs, w/c for mobility, on Gabapentin 100mg qd. Takes Vit B12. Had nerve conduction study 03/10/18 

## 2020-10-31 NOTE — Assessment & Plan Note (Signed)
takes Omeprazole 20mg  qd. Hgb 12.5 10/19/20

## 2020-10-31 NOTE — Assessment & Plan Note (Signed)
takes Levothyroxine, TSH 5.76 10/19/20

## 2020-10-31 NOTE — Assessment & Plan Note (Signed)
Left knee OA, takign Mobic,  f/u Ortho

## 2020-10-31 NOTE — Assessment & Plan Note (Signed)
stable, on Tamsulosin. underwent Urology evaluation in the past. 

## 2020-10-31 NOTE — Assessment & Plan Note (Signed)
R groin pressure ulcer, saw Wound Care specialist, treated with Silver Alginate.

## 2020-10-31 NOTE — Assessment & Plan Note (Signed)
takes Senokot S, °

## 2020-10-31 NOTE — Assessment & Plan Note (Signed)
increases swelling in leg, mild erythematous shins, no pain in calf with R+L feet dorsiflexion. Will increase Furosemide 20mg  qd, Kcl 68meq qd,  Bun/creat 19/0.8 10/19/20, update BMP one week. No echocardiogram located. Observe

## 2020-10-31 NOTE — Assessment & Plan Note (Signed)
Anxiety, psych consult 08/09/20, the patient declined recommended Depakote. TSH 5.76 10/19/20 

## 2020-11-06 ENCOUNTER — Encounter (HOSPITAL_BASED_OUTPATIENT_CLINIC_OR_DEPARTMENT_OTHER): Payer: Medicare PPO | Attending: Internal Medicine | Admitting: Internal Medicine

## 2020-11-06 ENCOUNTER — Other Ambulatory Visit: Payer: Self-pay

## 2020-11-06 DIAGNOSIS — G4730 Sleep apnea, unspecified: Secondary | ICD-10-CM | POA: Insufficient documentation

## 2020-11-06 DIAGNOSIS — G609 Hereditary and idiopathic neuropathy, unspecified: Secondary | ICD-10-CM | POA: Insufficient documentation

## 2020-11-06 DIAGNOSIS — S31103A Unspecified open wound of abdominal wall, right lower quadrant without penetration into peritoneal cavity, initial encounter: Secondary | ICD-10-CM | POA: Diagnosis not present

## 2020-11-06 DIAGNOSIS — X58XXXA Exposure to other specified factors, initial encounter: Secondary | ICD-10-CM | POA: Insufficient documentation

## 2020-11-06 DIAGNOSIS — Z86718 Personal history of other venous thrombosis and embolism: Secondary | ICD-10-CM | POA: Insufficient documentation

## 2020-11-06 DIAGNOSIS — I10 Essential (primary) hypertension: Secondary | ICD-10-CM | POA: Diagnosis not present

## 2020-11-06 LAB — COMPREHENSIVE METABOLIC PANEL: Calcium: 9.1 (ref 8.7–10.7)

## 2020-11-06 LAB — BASIC METABOLIC PANEL
BUN: 19 (ref 4–21)
CO2: 29 — AB (ref 13–22)
Chloride: 105 (ref 99–108)
Creatinine: 1 (ref 0.6–1.3)
Glucose: 90
Potassium: 4 (ref 3.4–5.3)
Sodium: 143 (ref 137–147)

## 2020-11-06 NOTE — Progress Notes (Signed)
CONALL, VANGORDER ([id number]) Visit Report for 11/06/2020 Arrival Information Details Patient Name: Date of Service: [name], [name] 11/06/2020 10:45 A M Medical Record Number: [id number] Patient Account Number: [id number] Date of Birth/Sex: Treating RN: 11/30/1930 (87 y.o. Ernestene Mention Primary Care Provider: Lennie Odor , Mast Other Clinician: Referring Provider: Treating Provider/Extender: [name] , [name]: 4 Visit Information History Since Last Visit Added or deleted any medications: Yes Patient Arrived: Wheel Chair Any new allergies or adverse reactions: No Arrival Time: 10:53 Had a fall or experienced change in No Accompanied By: spouse activities of daily living that may affect Transfer Assistance: None risk of falls: Patient Identification Verified: Yes Signs or symptoms of abuse/neglect since last visito No Secondary Verification Process Completed: Yes Hospitalized since last visit: No Patient Requires Transmission-Based Precautions: No Implantable device outside of the clinic excluding No Patient Has Alerts: No cellular tissue based products placed in the center since last visit: Has Dressing in Place as Prescribed: Yes Pain Present Now: Yes Electronic Signature(s) Signed: 11/06/2020 5:57:00 PM By: Baruch Gouty RN, BSN Entered By: Baruch Gouty on 11/06/2020 10:53:43 -------------------------------------------------------------------------------- Clinic Level of Care Assessment Details Patient Name: Date of Service: [name] 11/06/2020 10:45 A M Medical Record Number: [id number] Patient Account Number: [id number] Date of Birth/Sex: Treating RN: December 25, 1930 (87 y.o. [name], [name] Primary Care Provider: [name] Other Clinician: Referring Provider: Treating Provider/Extender: [name] , [name]: 4 Clinic Level of Care Assessment Items TOOL 4 Quantity Score X- 1 0 Use when  only an EandM is performed on FOLLOW-UP visit ASSESSMENTS - Nursing Assessment / Reassessment X- 1 10 Reassessment of Co-morbidities (includes updates in patient status) X- 1 5 Reassessment of Adherence to Treatment Plan ASSESSMENTS - Wound and Skin A ssessment / Reassessment X - Simple Wound Assessment / Reassessment - one wound 1 5 []  - 0 Complex Wound Assessment / Reassessment - multiple wounds X- 1 10 Dermatologic / Skin Assessment (not related to wound area) ASSESSMENTS - Focused Assessment []  - 0 Circumferential Edema Measurements - multi extremities X- 1 10 Nutritional Assessment / Counseling / Intervention []  - 0 Lower Extremity Assessment (monofilament, tuning fork, pulses) []  - 0 Peripheral Arterial Disease Assessment (using hand held doppler) ASSESSMENTS - Ostomy and/or Continence Assessment and Care []  - 0 Incontinence Assessment and Management []  - 0 Ostomy Care Assessment and Management (repouching, etc.) PROCESS - Coordination of Care X - Simple Patient / Family Education for ongoing care 1 15 []  - 0 Complex (extensive) Patient / Family Education for ongoing care X- 1 10 Staff obtains Programmer, systems, Records, T Results / Process Orders est X- 1 10 Staff telephones HHA, Nursing Homes / Clarify orders / etc []  - 0 Routine Transfer to another Facility (non-emergent condition) []  - 0 Routine Hospital Admission (non-emergent condition) []  - 0 New Admissions / Biomedical engineer / Ordering NPWT Apligraf, etc. , []  - 0 Emergency Hospital Admission (emergent condition) []  - 0 Simple Discharge Coordination []  - 0 Complex (extensive) Discharge Coordination PROCESS - Special Needs []  - 0 Pediatric / Minor Patient Management []  - 0 Isolation Patient Management []  - 0 Hearing / Language / Visual special needs []  - 0 Assessment of Community assistance (transportation, D/C planning, etc.) []  - 0 Additional assistance / Altered mentation []  - 0 Support  Surface(s) Assessment (bed, cushion, seat, etc.) INTERVENTIONS - Wound Cleansing / Measurement X - Simple Wound Cleansing - one wound 1 5 []  -  0 Complex Wound Cleansing - multiple wounds X- 1 5 Wound Imaging (photographs - any number of wounds) []  - 0 Wound Tracing (instead of photographs) X- 1 5 Simple Wound Measurement - one wound []  - 0 Complex Wound Measurement - multiple wounds INTERVENTIONS - Wound Dressings X - Small Wound Dressing one or multiple wounds 1 10 []  - 0 Medium Wound Dressing one or multiple wounds []  - 0 Large Wound Dressing one or multiple wounds X- 1 5 Application of Medications - topical []  - 0 Application of Medications - injection INTERVENTIONS - Miscellaneous []  - 0 External ear exam []  - 0 Specimen Collection (cultures, biopsies, blood, body fluids, etc.) []  - 0 Specimen(s) / Culture(s) sent or taken to Lab for analysis []  - 0 Patient Transfer (multiple staff / Civil Service fast streamer / Similar devices) []  - 0 Simple Staple / Suture removal (25 or less) []  - 0 Complex Staple / Suture removal (26 or more) []  - 0 Hypo / Hyperglycemic Management (close monitor of Blood Glucose) []  - 0 Ankle / Brachial Index (ABI) - do not check if billed separately X- 1 5 Vital Signs Has the patient been seen at the hospital within the last three years: Yes Total Score: 110 Level Of Care: New/Established - Level 3 Electronic Signature(s) Signed: 11/06/2020 5:58:27 PM By: Rhae Hammock RN Entered By: Rhae Hammock on 11/06/2020 11:32:01 -------------------------------------------------------------------------------- Encounter Discharge Information Details Patient Name: Date of Service: Travis Costain C. 11/06/2020 10:45 A M Medical Record Number: [id number] Patient Account Number: [id number] Date of Birth/Sex: Treating RN: [birth year] (87 y.o. [name] Primary Care Provider: [name] Other Clinician: Referring Provider: Treating Provider/Extender:  [name] , [name]: 4 Encounter Discharge Information Items Discharge Condition: Stable Ambulatory Status: Wheelchair Discharge Destination: Home Transportation: Private Auto Accompanied By: wife Schedule Follow-up Appointment: Yes Clinical Summary of Care: Electronic Signature(s) Signed: 11/06/2020 1:49:11 PM By: Deon Pilling Entered By: Deon Pilling on 11/06/2020 12:56:39 -------------------------------------------------------------------------------- Lower Extremity Assessment Details Patient Name: Date of Service: AGNES, PROBERT 11/06/2020 10:45 A M Medical Record Number: [id number] Patient Account Number: [id number] Date of Birth/Sex: Treating RN: 03-09-1931 (87 y.o. Ernestene Mention Primary Care Provider: [name] Other Clinician: Referring Provider: Treating Provider/Extender: [name] , [name]: 4 Electronic Signature(s) Signed: 11/06/2020 5:57:00 PM By: Baruch Gouty RN, BSN Entered By: Baruch Gouty on 11/06/2020 10:57:43 -------------------------------------------------------------------------------- Multi Wound Chart Details Patient Name: Date of Service: [name] 11/06/2020 10:45 A M Medical Record Number: [id number] Patient Account Number: [id number] Date of Birth/Sex: Treating RN: Jul 06, 1931 (87 y.o. [name], [name] Primary Care Provider: [name] Other Clinician: Referring Provider: Treating Provider/Extender: [name] , [name]: 4 Vital Signs Height(in): 60 Pulse(bpm): 75 Weight(lbs): 222 Blood Pressure(mmHg): 137/65 Body Mass Index(BMI): 33 Temperature(F): 98 Respiratory Rate(breaths/min): 18 Photos: [1:No Photos Right, Proximal, Lateral Abdomen -] [2:No Photos Right, Distal, Lateral Abdomen - Lower] [N/A:N/A N/A] Wound Location: [1:Lower Quadrant Gradually Appeared] [2:Quadrant Gradually Appeared] [N/A:N/A] Wounding Event:  [1:Abrasion] [2:Abrasion] [N/A:N/A] Primary Etiology: [1:Cataracts, Anemia, Sleep Apnea,] [2:Cataracts, Anemia, Sleep Apnea,] [N/A:N/A] Comorbid History: [1:Hypertension, Osteoarthritis, Neuropathy 02/07/2020] [2:Hypertension, Osteoarthritis, Neuropathy 09/24/2020] [N/A:N/A] Date Acquired: [1:4] [2:4] [N/A:N/A] Weeks of Treatment: [1:Open] [2:Healed - Epithelialized] [N/A:N/A] Wound Status: [1:1.3x4.6x0.1] [2:0x0x0] [N/A:N/A] Measurements L x W x D (cm) [1:4.697] [2:0] [N/A:N/A] A (cm) : rea [1:0.47] [2:0] [N/A:N/A] Volume (cm) : [1:64.40%] [2:100.00%] [N/A:N/A] % Reduction in Area: [1:64.40%] [2:100.00%] [N/A:N/A] % Reduction in Volume: [  1:Full Thickness Without Exposed] [2:Full Thickness Without Exposed] [N/A:N/A] Classification: [1:Support Structures Medium] [2:Support Structures None Present] [N/A:N/A] Exudate Amount: [1:Sanguinous] [2:N/A] [N/A:N/A] Exudate Type: [1:red] [2:N/A] [N/A:N/A] Exudate Color: [1:Flat and Intact] [2:Flat and Intact] [N/A:N/A] Wound Margin: [1:Large (67-100%)] [2:None Present (0%)] [N/A:N/A] Granulation Amount: [1:Red, Friable] [2:N/A] [N/A:N/A] Granulation Quality: [1:None Present (0%)] [2:None Present (0%)] [N/A:N/A] Necrotic Amount: [1:Fat Layer (Subcutaneous Tissue): Yes Fascia: No] [N/A:N/A] Exposed Structures: [1:Fascia: No Tendon: No Muscle: No Joint: No Bone: No Small (1-33%)] [2:Fat Layer (Subcutaneous Tissue): No Tendon: No Muscle: No Joint: No Bone: No Large (67-100%)] [N/A:N/A] Treatment Notes Electronic Signature(s) Signed: 11/06/2020 5:58:27 PM By: Rhae Hammock RN Signed: 11/06/2020 5:59:48 PM By: Linton Ham MD Entered By: Linton Ham on 11/06/2020 12:04:01 -------------------------------------------------------------------------------- Multi-Disciplinary Care Plan Details Patient Name: Date of Service: [name] 11/06/2020 10:45 A M Medical Record Number: [id number] Patient Account Number: [id number] Date of  Birth/Sex: Treating RN: December 28, 1930 (87 y.o. [name], [name] Primary Care Provider: [name] Other Clinician: Referring Provider: Treating Provider/Extender: [name] , [name]: 4 Active Inactive Orientation to the Wound Care Program Nursing Diagnoses: Knowledge deficit related to the wound healing center program Goals: Patient/caregiver will verbalize understanding of the [facility] Program Date Initiated: 10/09/2020 Target Resolution Date: 11/16/2020 Goal Status: Active Interventions: Provide education on orientation to the wound center Notes: Wound/Skin Impairment Nursing Diagnoses: Impaired tissue integrity Knowledge deficit related to ulceration/compromised skin integrity Goals: Patient will have a decrease in wound volume by X% from date: (specify in notes) Date Initiated: 10/09/2020 Target Resolution Date: 11/16/2020 Goal Status: Active Patient/caregiver will verbalize understanding of skin care regimen Date Initiated: 10/09/2020 Target Resolution Date: 12/06/2020 Goal Status: Active Ulcer/skin breakdown will have a volume reduction of 30% by week 4 Date Initiated: 10/09/2020 Target Resolution Date: 12/06/2020 Goal Status: Active Interventions: Assess patient/caregiver ability to obtain necessary supplies Assess patient/caregiver ability to perform ulcer/skin care regimen upon admission and as needed Assess ulceration(s) every visit Notes: Electronic Signature(s) Signed: 11/06/2020 5:58:27 PM By: Rhae Hammock RN Entered By: Rhae Hammock on 11/06/2020 11:27:03 -------------------------------------------------------------------------------- Pain Assessment Details Patient Name: Date of Service: [name] 11/06/2020 10:45 A M Medical Record Number: [id number] Patient Account Number: [id number] Date of Birth/Sex: Treating RN: March 19, 1931 (87 y.o. Ernestene Mention Primary Care Provider: [name] Other  Clinician: Referring Provider: Treating Provider/Extender: [name] , [name]: 4 Active Problems Location of Pain Severity and Description of Pain Patient Has Paino Yes Site Locations Pain Location: Pain Location: Pain in Ulcers With Dressing Change: Yes Duration of the Pain. Constant / Intermittento Intermittent Rate the pain. Current Pain Level: 0 Worst Pain Level: 4 Character of Pain Describe the Pain: Tender, Other: sore Pain Management and Medication Current Pain Management: Other: reposition Is the Current Pain Management Adequate: Adequate How does your wound impact your activities of daily livingo Sleep: No Bathing: No Appetite: No Relationship With Others: No Bladder Continence: No Emotions: No Bowel Continence: No Work: No Toileting: No Drive: No Dressing: No Hobbies: No Electronic Signature(s) Signed: 11/06/2020 5:57:00 PM By: Baruch Gouty RN, BSN Entered By: Baruch Gouty on 11/06/2020 10:57:33 -------------------------------------------------------------------------------- Patient/Caregiver Education Details Patient Name: Date of Service: [name] 3/1/2022andnbsp10:45 A M Medical Record Number: [id number] Patient Account Number: [id number] Date of Birth/Gender: Treating RN: May 23, 1931 (87 y.o. [name] Primary Care Physician: [name] Other Clinician: Referring Physician: Treating Physician/Extender: [name] , [name]: 4  Education Assessment Education Provided To: Patient Education Topics Provided Basic Hygiene: Methods: Explain/Verbal Responses: State content correctly Electronic Signature(s) Signed: 11/06/2020 5:58:27 PM By: Rhae Hammock RN Entered By: Rhae Hammock on 11/06/2020 11:30:11 -------------------------------------------------------------------------------- Wound Assessment Details Patient Name: Date of Service: [name] 11/06/2020 10:45 A M Medical Record Number: [id number] Patient Account Number: [id number] Date of Birth/Sex: Treating RN: 11-22-1930 (87 y.o. Ernestene Mention Primary Care Provider: Lennie Odor , Mast Other Clinician: Referring Provider: Treating Provider/Extender: [name] , [name]: 4 Wound Status Wound Number: 1 Primary Abrasion Etiology: Wound Location: Right, Proximal, Lateral Abdomen - Lower Quadrant Wound Status: Open Wounding Event: Gradually Appeared Comorbid Cataracts, Anemia, Sleep Apnea, Hypertension, Osteoarthritis, Date Acquired: 02/07/2020 History: Neuropathy Weeks Of Treatment: 4 Clustered Wound: No Photos Wound Measurements Length: (cm) 1.3 Width: (cm) 4.6 Depth: (cm) 0.1 Area: (cm) 4.697 Volume: (cm) 0.47 % Reduction in Area: 64.4% % Reduction in Volume: 64.4% Epithelialization: Small (1-33%) Tunneling: No Undermining: No Wound Description Classification: Full Thickness Without Exposed Support Structures Wound Margin: Flat and Intact Exudate Amount: Medium Exudate Type: Sanguinous Exudate Color: red Foul Odor After Cleansing: No Slough/Fibrino No Wound Bed Granulation Amount: Large (67-100%) Exposed Structure Granulation Quality: Red, Friable Fascia Exposed: No Necrotic Amount: None Present (0%) Fat Layer (Subcutaneous Tissue) Exposed: Yes Tendon Exposed: No Muscle Exposed: No Joint Exposed: No Bone Exposed: No Treatment Notes Wound #1 (Abdomen - Lower Quadrant) Wound Laterality: Right, Lateral, Proximal Cleanser Soap and Water Discharge Instruction: May shower and wash wound with dial antibacterial soap and water prior to dressing change. Normal Saline Discharge Instruction: Cleanse the wound with Normal Saline prior to applying a clean dressing using gauze sponges, not tissue or cotton balls. Peri-Wound Care Topical Primary Dressing KerraCel Ag Gelling Fiber Dressing, 4x5 in (silver alginate) Discharge  Instruction: Apply silver alginate to wound bed as instructed Secondary Dressing Woven Gauze Sponge, Non-Sterile 4x4 in Discharge Instruction: Apply over primary dressing as directed. ABD Pad, 5x9 Discharge Instruction: Apply over primary dressing as directed. Secured With 72M Medipore H Soft Cloth Surgical T 4 x 2 (in/yd) ape Discharge Instruction: Secure dressing with tape as directed. Compression Wrap Compression Stockings Add-Ons Electronic Signature(s) Signed: 11/06/2020 5:53:33 PM By: Sandre Kitty Signed: 11/06/2020 5:57:00 PM By: Baruch Gouty RN, BSN Entered By: Sandre Kitty on 11/06/2020 17:45:07 -------------------------------------------------------------------------------- Wound Assessment Details Patient Name: Date of Service: [name] 11/06/2020 10:45 A M Medical Record Number: [id number] Patient Account Number: [id number] Date of Birth/Sex: Treating RN: [birth year] (87 y.o. Ernestene Mention Primary Care Provider: Lennie Odor , Mast Other Clinician: Referring Provider: Treating Provider/Extender: [name] , [name]: 4 Wound Status Wound Number: 2 Primary Abrasion Etiology: Wound Location: Right, Distal, Lateral Abdomen - Lower Quadrant Wound Status: Healed - Epithelialized Wounding Event: Gradually Appeared Comorbid Cataracts, Anemia, Sleep Apnea, Hypertension, Osteoarthritis, Date Acquired: 09/24/2020 History: Neuropathy Weeks Of Treatment: 4 Clustered Wound: No Wound Measurements Length: (cm) Width: (cm) Depth: (cm) Area: (cm) Volume: (cm) 0 % Reduction in Area: 100% 0 % Reduction in Volume: 100% 0 Epithelialization: Large (67-100%) 0 Tunneling: No 0 Undermining: No Wound Description Classification: Full Thickness Without Exposed Support Structures Wound Margin: Flat and Intact Exudate Amount: None Present Foul Odor After Cleansing: No Slough/Fibrino No Wound Bed Granulation Amount: None Present (0%)  Exposed Structure Necrotic Amount: None Present (0%) Fascia Exposed: No Fat Layer (Subcutaneous Tissue) Exposed: No Tendon Exposed: No Muscle Exposed: No Joint Exposed: No Bone Exposed: No Electronic Signature(s) Signed:  11/06/2020 5:57:00 PM By: Baruch Gouty RN, BSN Signed: 11/06/2020 5:57:00 PM By: Baruch Gouty RN, BSN Entered By: Baruch Gouty on 11/06/2020 11:10:42 -------------------------------------------------------------------------------- Vitals Details Patient Name: Date of Service: Travis Costain C. 11/06/2020 10:45 A M Medical Record Number: [id number] Patient Account Number: [id number] Date of Birth/Sex: Treating RN: 02-Jan-1931 (87 y.o. Ernestene Mention Primary Care Provider: Lennie Odor , Mast Other Clinician: Referring Provider: Treating Provider/Extender: [name] , [name]: 4 Vital Signs Time Taken: 10:53 Temperature (F): 98 Height (in): 69 Pulse (bpm): 75 Source: Stated Respiratory Rate (breaths/min): 18 Weight (lbs): 222 Blood Pressure (mmHg): 137/65 Source: Stated Reference Range: 80 - 120 mg / dl Body Mass Index (BMI): 32.8 Electronic Signature(s) Signed: 11/06/2020 5:57:00 PM By: Baruch Gouty RN, BSN Entered By: Baruch Gouty on 11/06/2020 10:55:45

## 2020-11-06 NOTE — Progress Notes (Signed)
[name] ([id number]) Visit Report for 11/06/2020 HPI Details Patient Name: Date of Service: [name], [name] 11/06/2020 10:45 A M Medical Record Number: [id number] Patient Account Number: [id number] Date of Birth/Sex: Treating RN: [birth year] (87 y.o. [name] Primary Care Provider: [name] Other Clinician: Referring Provider: Treating Provider/Extender: [name] , [name]: 4 History of Present Illness HPI Description: ADMISSION 10/09/2020 This is a pleasant 44 year old man from assisted living at [facility] from [place] accompanied by his wife. They are here for a many month progression of the wounds in the right lower abdominal pannus fold. This initially was noted apparently in June 2021. There is some suggestion that he may have scratched this area and opened the wound but the patient does not remember doing this. They were using silver alginate in this area for a while but more recently a hydrocolloid. The area is not painful. There is also suggestion that they are using nystatin for a while however they are not doing this currently. The patient is fairly adamant that he is not allowing his waistband of his clothing to rub on this area Past medical history; idiopathic peripheral neuropathy, osteoarthritis, BPH, falls, lumbar spinal stenosis, sleep apnea, small vessel DVT hypertension, lower , extremity edema. The patient walks for short distances with a walker the rest of the time he is in a wheelchair. They moved into the assisted living order a [facility] [place] about 9 months ago previously were at independent level 2/15; right lower abdominal pannus fold wound. This was a bit atypical last week but I elected to treat this is the usual friction type wound in the pannus fold using silver alginate offloading the area etc. He had one larger area and a smaller area both of these seem better and surface area has improved 3/1; right  lower abdominal pannus fold. He presented with a bit of an atypical wound raised and hyper granular. However this is gradually improved in terms of condition of the wound surface as well as surface area with standard treatment for a pannus/friction area wound. We have been using silver alginate they are changing this at [facility] [employer]) Signed: 11/06/2020 5:59:48 PM By: Linton Ham MD Entered By: Linton Ham on 11/06/2020 12:04:42 -------------------------------------------------------------------------------- Physical Exam Details Patient Name: Date of Service: [name]. 11/06/2020 10:45 A M Medical Record Number: [id number] Patient Account Number: [id number] Date of Birth/Sex: Treating RN: 08/17/1931 (87 y.o. [name] Primary Care Provider: [name] Other Clinician: Referring Provider: Treating Provider/Extender: [name] , [name]: 4 Constitutional Sitting or standing Blood Pressure is within target range for patient.. Pulse regular and within target range for patient.Marland Kitchen Respirations regular, non-labored and within target range.. Temperature is normal and within the target range for the patient.Marland Kitchen Appears in no distress. Integumentary (Hair, Skin) No evidence of skin involvement around the wound there is no evidence of infection. Notes Wound exam; right lower abdominal fold wound just above the inguinal fold. This is gradually making improvements in size. The rims of epithelialization look healthy the granulation looks healthy. Electronic Signature(s) Signed: 11/06/2020 5:59:48 PM By: Linton Ham MD Entered By: Linton Ham on 11/06/2020 12:07:05 -------------------------------------------------------------------------------- Physician Orders Details Patient Name: Date of Service: [name] 11/06/2020 10:45 A M Medical Record Number: [id number] Patient Account Number:  [id number] Date of Birth/Sex: Treating RN: May 04, 1931 (87 y.o. [name] Primary Care Provider: [name] Other Clinician: Referring  Provider: Treating Provider/Extender: [name] , [name]: 4 Verbal / Phone Orders: No Diagnosis Coding Follow-up Appointments Return Appointment in 2 weeks. Bathing/ Shower/ Hygiene May shower with protection but do not get wound dressing(s) wet. - May use soap and water in the shower Off-Loading Turn and reposition every 2 hours Wound Treatment Wound #1 - Abdomen - Lower Quadrant Wound Laterality: Right, Lateral, Proximal Cleanser: Soap and Water 1 x Per Day/7 Days Discharge Instructions: May shower and wash wound with dial antibacterial soap and water prior to dressing change. Cleanser: Normal Saline (Generic) 1 x Per Day/7 Days Discharge Instructions: Cleanse the wound with Normal Saline prior to applying a clean dressing using gauze sponges, not tissue or cotton balls. Prim Dressing: KerraCel Ag Gelling Fiber Dressing, 4x5 in (silver alginate) (Generic) 1 x Per Day/7 Days ary Discharge Instructions: Apply silver alginate to wound bed as instructed Secondary Dressing: Woven Gauze Sponge, Non-Sterile 4x4 in (Generic) 1 x Per Day/7 Days Discharge Instructions: Apply over primary dressing as directed. Secondary Dressing: ABD Pad, 5x9 1 x Per Day/7 Days Discharge Instructions: Apply over primary dressing as directed. Secured With: 23M Medipore H Soft Cloth Surgical T 4 x 2 (in/yd) (Generic) 1 x Per Day/7 Days ape Discharge Instructions: Secure dressing with tape as directed. Electronic Signature(s) Signed: 11/06/2020 5:58:27 PM By: Rhae Hammock RN Signed: 11/06/2020 5:59:48 PM By: Linton Ham MD Entered By: Rhae Hammock on 11/06/2020 11:26:29 -------------------------------------------------------------------------------- Problem List Details Patient Name: Date of Service: [name].  11/06/2020 10:45 A M Medical Record Number: [id number] Patient Account Number: [id number] Date of Birth/Sex: Treating RN: 07-28-1931 (87 y.o. [name], [name] Primary Care Provider: [name] Other Clinician: Referring Provider: Treating Provider/Extender: [name] , [name]: 4 Active Problems ICD-10 Encounter Code Description Active Date MDM Diagnosis S31.103D Unspecified open wound of abdominal wall, right lower quadrant without 10/09/2020 No Yes penetration into peritoneal cavity, subsequent encounter Inactive Problems Resolved Problems Electronic Signature(s) Signed: 11/06/2020 5:59:48 PM By: Linton Ham MD Entered By: Linton Ham on 11/06/2020 12:03:54 -------------------------------------------------------------------------------- Progress Note Details Patient Name: Date of Service: [name] 11/06/2020 10:45 A M Medical Record Number: [id number] Patient Account Number: [id number] Date of Birth/Sex: Treating RN: 21-Jan-1931 (87 y.o. [name] Primary Care Provider: [name] Other Clinician: Referring Provider: Treating Provider/Extender: [name] , [name]: 4 Subjective History of Present Illness (HPI) ADMISSION 10/09/2020 This is a pleasant 19 year old man from assisted living at [facility] from [place] accompanied by his wife. They are here for a many month progression of the wounds in the right lower abdominal pannus fold. This initially was noted apparently in June 2021. There is some suggestion that he may have scratched this area and opened the wound but the patient does not remember doing this. They were using silver alginate in this area for a while but more recently a hydrocolloid. The area is not painful. There is also suggestion that they are using nystatin for a while however they are not doing this currently. The patient is fairly adamant that he is not allowing his  waistband of his clothing to rub on this area Past medical history; idiopathic peripheral neuropathy, osteoarthritis, BPH, falls, lumbar spinal stenosis, sleep apnea, small vessel DVT hypertension, lower , extremity edema. The patient walks for short distances with a walker the rest of the time he is in a wheelchair. They moved into the assisted living order a friend[facility] home  [place] about 9 months ago previously were at independent level 2/15; right lower abdominal pannus fold wound. This was a bit atypical last week but I elected to treat this is the usual friction type wound in the pannus fold using silver alginate offloading the area etc. He had one larger area and a smaller area both of these seem better and surface area has improved 3/1; right lower abdominal pannus fold. He presented with a bit of an atypical wound raised and hyper granular. However this is gradually improved in terms of condition of the wound surface as well as surface area with standard treatment for a pannus/friction area wound. We have been using silver alginate they are changing this at [facility] [place] Objective Constitutional Sitting or standing Blood Pressure is within target range for patient.. Pulse regular and within target range for patient.Marland Kitchen Respirations regular, non-labored and within target range.. Temperature is normal and within the target range for the patient.Marland Kitchen Appears in no distress. Vitals Time Taken: 10:53 AM, Height: 69 in, Source: Stated, Weight: 222 lbs, Source: Stated, BMI: 32.8, Temperature: 98 F, Pulse: 75 bpm, Respiratory Rate: 18 breaths/min, Blood Pressure: 137/65 mmHg. General Notes: Wound exam; right lower abdominal fold wound just above the inguinal fold. This is gradually making improvements in size. The rims of epithelialization look healthy the granulation looks healthy. Integumentary (Hair, Skin) No evidence of skin involvement around the wound there is no evidence of  infection. Wound #1 status is Open. Original cause of wound was Gradually Appeared. The date acquired was: 02/07/2020. The wound has been in treatment 4 weeks. The wound is located on the Right,Proximal,Lateral Abdomen - Lower Quadrant. The wound measures 1.3cm length x 4.6cm width x 0.1cm depth; 4.697cm^2 area and 0.47cm^3 volume. There is Fat Layer (Subcutaneous Tissue) exposed. There is no tunneling or undermining noted. There is a medium amount of sanguinous drainage noted. The wound margin is flat and intact. There is large (67-100%) red, friable granulation within the wound bed. There is no necrotic tissue within the wound bed. Wound #2 status is Healed - Epithelialized. Original cause of wound was Gradually Appeared. The date acquired was: 09/24/2020. The wound has been in treatment 4 weeks. The wound is located on the Right,Distal,Lateral Abdomen - Lower Quadrant. The wound measures 0cm length x 0cm width x 0cm depth; 0cm^2 area and 0cm^3 volume. There is no tunneling or undermining noted. There is a none present amount of drainage noted. The wound margin is flat and intact. There is no granulation within the wound bed. There is no necrotic tissue within the wound bed. Assessment Active Problems ICD-10 Unspecified open wound of abdominal wall, right lower quadrant without penetration into peritoneal cavity, subsequent encounter Plan Follow-up Appointments: Return Appointment in 2 weeks. Bathing/ Shower/ Hygiene: May shower with protection but do not get wound dressing(s) wet. - May use soap and water in the shower Off-Loading: Turn and reposition every 2 hours WOUND #1: - Abdomen - Lower Quadrant Wound Laterality: Right, Lateral, Proximal Cleanser: Soap and Water 1 x Per Day/7 Days Discharge Instructions: May shower and wash wound with dial antibacterial soap and water prior to dressing change. Cleanser: Normal Saline (Generic) 1 x Per Day/7 Days Discharge Instructions: Cleanse the  wound with Normal Saline prior to applying a clean dressing using gauze sponges, not tissue or cotton balls. Prim Dressing: KerraCel Ag Gelling Fiber Dressing, 4x5 in (silver alginate) (Generic) 1 x Per Day/7 Days ary Discharge Instructions: Apply silver alginate to wound bed as instructed  Secondary Dressing: Woven Gauze Sponge, Non-Sterile 4x4 in (Generic) 1 x Per Day/7 Days Discharge Instructions: Apply over primary dressing as directed. Secondary Dressing: ABD Pad, 5x9 1 x Per Day/7 Days Discharge Instructions: Apply over primary dressing as directed. Secured With: 47M Medipore H Soft Cloth Surgical T 4 x 2 (in/yd) (Generic) 1 x Per Day/7 Days ape Discharge Instructions: Secure dressing with tape as directed. 1. I see no reason to change the current dressing which is silver alginate/ABDs. This is being changed at friend's home [place]. 2. Follow-up 3 weeks when I first saw this area I thought about biopsying it looks somewhat odd and the pannus here is not his largest some we see however this is continued to improve with conservative treatment of my current thinking is a biopsy will not be necessary Electronic Signature(s) Signed: 11/06/2020 5:59:48 PM By: Linton Ham MD Entered By: Linton Ham on 11/06/2020 12:07:58 -------------------------------------------------------------------------------- SuperBill Details Patient Name: Date of Service: [name] 11/06/2020 Medical Record Number: [id number] Patient Account Number: [id number] Date of Birth/Sex: Treating RN: [birth year] (87 y.o. [name], [name] Primary Care Provider: [name] Other Clinician: Referring Provider: Treating Provider/Extender: [name] , [name]: 4 Diagnosis Coding ICD-10 Codes Code Description S31.103D Unspecified open wound of abdominal wall, right lower quadrant without penetration into peritoneal cavity, subsequent encounter Physician Procedures : CPT4  Code Description Modifier [id number] - WC PHYS LEVEL 3 - EST PT ICD-10 Diagnosis Description S31.103D Unspecified open wound of abdominal wall, right lower quadrant without penetration into peritoneal cavi subsequent encounter Quantity: 1 ty, Electronic Signature(s) Signed: 11/06/2020 5:59:48 PM By: Linton Ham MD Entered By: Linton Ham on 11/06/2020 12:08:16

## 2020-11-27 ENCOUNTER — Encounter (HOSPITAL_BASED_OUTPATIENT_CLINIC_OR_DEPARTMENT_OTHER): Payer: Medicare PPO | Admitting: Internal Medicine

## 2020-11-30 ENCOUNTER — Encounter (HOSPITAL_BASED_OUTPATIENT_CLINIC_OR_DEPARTMENT_OTHER): Payer: Medicare PPO | Admitting: Internal Medicine

## 2020-11-30 ENCOUNTER — Other Ambulatory Visit: Payer: Self-pay

## 2020-11-30 DIAGNOSIS — G609 Hereditary and idiopathic neuropathy, unspecified: Secondary | ICD-10-CM | POA: Diagnosis not present

## 2020-11-30 DIAGNOSIS — S31103A Unspecified open wound of abdominal wall, right lower quadrant without penetration into peritoneal cavity, initial encounter: Secondary | ICD-10-CM | POA: Diagnosis not present

## 2020-11-30 DIAGNOSIS — L98492 Non-pressure chronic ulcer of skin of other sites with fat layer exposed: Secondary | ICD-10-CM | POA: Diagnosis not present

## 2020-11-30 DIAGNOSIS — I10 Essential (primary) hypertension: Secondary | ICD-10-CM | POA: Diagnosis not present

## 2020-11-30 DIAGNOSIS — X58XXXA Exposure to other specified factors, initial encounter: Secondary | ICD-10-CM | POA: Diagnosis not present

## 2020-11-30 DIAGNOSIS — G4730 Sleep apnea, unspecified: Secondary | ICD-10-CM | POA: Diagnosis not present

## 2020-11-30 DIAGNOSIS — Z86718 Personal history of other venous thrombosis and embolism: Secondary | ICD-10-CM | POA: Diagnosis not present

## 2020-12-03 NOTE — Progress Notes (Signed)
[name], [name] ([id number]) Visit Report for 11/30/2020 HPI Details Patient Name: Date of Service: [name], [name] 11/30/2020 11:15 A M Medical Record Number: [id number] Patient Account Number: [id number] Date of Birth/Sex: Treating RN: 1931-05-15 (87 y.o. [name] Primary Care Provider: Marlana Adkins Other Clinician: Referring Provider: Treating Provider/Extender: [name] , [name] in Treatment: 7 History of Present Illness HPI Description: ADMISSION 10/09/2020 This is a pleasant 87 year old man from assisted living at [facility] from [place] accompanied by his wife. They are here for a many month progression of the wounds in the right lower abdominal pannus fold. This initially was noted apparently in June 2021. There is some suggestion that he may have scratched this area and opened the wound but the patient does not remember doing this. They were using silver alginate in this area for a while but more recently a hydrocolloid. The area is not painful. There is also suggestion that they are using nystatin for a while however they are not doing this currently. The patient is fairly adamant that he is not allowing his waistband of his clothing to rub on this area Past medical history; idiopathic peripheral neuropathy, osteoarthritis, BPH, falls, lumbar spinal stenosis, sleep apnea, small vessel DVT hypertension, lower , extremity edema. The patient walks for short distances with a walker the rest of the time he is in a wheelchair. They moved into the assisted living order a [facility] [place] about 9 months ago previously were at independent level 2/15; right lower abdominal pannus fold wound. This was a bit atypical last week but I elected to treat this is the usual friction type wound in the pannus fold using silver alginate offloading the area etc. He had one larger area and a smaller area both of these seem better and surface area has improved 3/1; right  lower abdominal pannus fold. He presented with a bit of an atypical wound raised and hyper granular. However this is gradually improved in terms of condition of the wound surface as well as surface area with standard treatment for a pannus/friction area wound. We have been using silver alginate they are changing this at [facility] [place] 3/25; little over 3 Adkins since we've seen this man. He has been a typical wound in the right lateral abdomen in the middle of pannus folds however the pannus folds themselves were not that impressive. I've been managing this conservatively and the wound volume certainly has come down. We've been using Alginate Electronic Signature(s) Signed: 12/03/2020 9:50:14 AM By: Travis Ham MD Entered By: [name] on 12/02/2020 07:52:48 -------------------------------------------------------------------------------- Chemical Cauterization Details Patient Name: Date of Service: [name]. 11/30/2020 11:15 A M Medical Record Number: [id number] Patient Account Number: [id number] Date of Birth/Sex: Treating RN: 06-15-1931 (87 y.o. [name] Primary Care Provider: Marlana Adkins Other Clinician: Referring Provider: Treating Provider/Extender: [name] , [name] in Treatment: 7 Procedure Performed for: Wound #1 Right,Proximal,Lateral Abdomen - Lower Quadrant Performed By: Physician [name]., MD Post Procedure Diagnosis Same as Pre-procedure Electronic Signature(s) Signed: 11/30/2020 6:16:52 PM By: [name] Signed: 12/03/2020 9:50:14 AM By: Travis Ham MD Entered By: [name] on 11/30/2020 12:36:17 -------------------------------------------------------------------------------- Physical Exam Details Patient Name: Date of Service: [name] 11/30/2020 11:15 A M Medical Record Number: [id number] Patient Account Number: [id number] Date of Birth/Sex: Treating RN: 1930/09/12 (87 y.o. [name] Primary Care Provider: Marlana Adkins Other Clinician: Referring Provider: Treating Provider/Extender: [name] ,  [name] in Treatment: 7 Constitutional Patient is hypertensive.. Pulse regular and within target range for patient.Marland Kitchen Respirations regular, non-labored and within target range.. Temperature is normal and within the target range for the patient.Marland Kitchen Appears in no distress. Notes when exam; right abdomen in the middle of pannus folds although the pannus folds themselves are not all that impressive. Wound surfaces granular but friable. There is no hyper granulation today. No evidence of surrounding infection. There is no tenderness around the wound edge. Electronic Signature(s) Signed: 12/03/2020 9:50:14 AM By: Travis Ham MD Entered By: [name] on 12/02/2020 07:50:27 -------------------------------------------------------------------------------- Physician Orders Details Patient Name: Date of Service: [name]. 11/30/2020 11:15 A M Medical Record Number: [id number] Patient Account Number: [id number] Date of Birth/Sex: Treating RN: Sep 10, 1930 (87 y.o. [name] Primary Care Provider: Marlana Adkins Other Clinician: Referring Provider: Treating Provider/Extender: [name] , [name] in Treatment: 7 Verbal / Phone Orders: No Diagnosis Coding Follow-up Appointments Return Appointment in 2 Adkins. Bathing/ Shower/ Hygiene May shower with protection but do not get wound dressing(s) wet. - May use soap and water in the shower Off-Loading Turn and reposition every 2 hours Wound Treatment Wound #1 - Abdomen - Lower Quadrant Wound Laterality: Right, Lateral, Proximal Cleanser: Soap and Water 1 x Per Day/7 Days Discharge Instructions: May shower and wash wound with dial antibacterial soap and water prior to dressing change. Cleanser: Normal Saline (Generic) 1 x Per Day/7 Days Discharge Instructions: Cleanse the  wound with Normal Saline prior to applying a clean dressing using gauze sponges, not tissue or cotton balls. Prim Dressing: KerraCel Ag Gelling Fiber Dressing, 4x5 in (silver alginate) (Generic) 1 x Per Day/7 Days ary Discharge Instructions: Apply silver alginate to wound bed as instructed Secondary Dressing: Woven Gauze Sponge, Non-Sterile 4x4 in (Generic) 1 x Per Day/7 Days Discharge Instructions: Apply over primary dressing as directed. Secondary Dressing: ABD Pad, 5x9 1 x Per Day/7 Days Discharge Instructions: Apply over primary dressing as directed. Secured With: 79M Medipore H Soft Cloth Surgical T 4 x 2 (in/yd) (Generic) 1 x Per Day/7 Days ape Discharge Instructions: Secure dressing with tape as directed. Electronic Signature(s) Signed: 11/30/2020 6:16:52 PM By: [name] Signed: 12/03/2020 9:50:14 AM By: Travis Ham MD Entered By: [name] on 11/30/2020 12:36:47 -------------------------------------------------------------------------------- Problem List Details Patient Name: Date of Service: [name]. 11/30/2020 11:15 A M Medical Record Number: [id number] Patient Account Number: [id number] Date of Birth/Sex: Treating RN: 03/18/1931 (87 y.o. [name] Primary Care Provider: Marlana Adkins Other Clinician: Referring Provider: Treating Provider/Extender: [name] , [name] in Treatment: 7 Active Problems ICD-10 Encounter Code Description Active Date MDM Diagnosis S31.103D Unspecified open wound of abdominal wall, right lower quadrant without 10/09/2020 No Yes penetration into peritoneal cavity, subsequent encounter Inactive Problems Resolved Problems Electronic Signature(s) Signed: 12/03/2020 9:50:14 AM By: Travis Ham MD Entered By: [name] on 12/02/2020 07:46:21 -------------------------------------------------------------------------------- Progress Note Details Patient Name: Date of Service: [name].  11/30/2020 11:15 A M Medical Record Number: [id number] Patient Account Number: [id number] Date of Birth/Sex: Treating RN: Apr 10, 1931 (87 y.o. [name] Primary Care Provider: Marlana Adkins Other Clinician: Referring Provider: Treating Provider/Extender: [name] , [name] in Treatment: 7 Subjective History of Present Illness (HPI) ADMISSION 10/09/2020 This is a pleasant 10 year old man from assisted living at [facility] from [place] accompanied by his wife. They are here for a many month progression of the wounds in the right lower  abdominal pannus fold. This initially was noted apparently in June 2021. There is some suggestion that he may have scratched this area and opened the wound but the patient does not remember doing this. They were using silver alginate in this area for a while but more recently a hydrocolloid. The area is not painful. There is also suggestion that they are using nystatin for a while however they are not doing this currently. The patient is fairly adamant that he is not allowing his waistband of his clothing to rub on this area Past medical history; idiopathic peripheral neuropathy, osteoarthritis, BPH, falls, lumbar spinal stenosis, sleep apnea, small vessel DVT hypertension, lower , extremity edema. The patient walks for short distances with a walker the rest of the time he is in a wheelchair. They moved into the assisted living order a friend[facility] [place] about 9 months ago previously were at independent level 2/15; right lower abdominal pannus fold wound. This was a bit atypical last week but I elected to treat this is the usual friction type wound in the pannus fold using silver alginate offloading the area etc. He had one larger area and a smaller area both of these seem better and surface area has improved 3/1; right lower abdominal pannus fold. He presented with a bit of an atypical wound raised and hyper granular. However this is  gradually improved in terms of condition of the wound surface as well as surface area with standard treatment for a pannus/friction area wound. We have been using silver alginate they are changing this at [facility] [place] 3/25; little over 3 Adkins since we've seen this man. He has been a typical wound in the right lateral abdomen in the middle of pannus folds however the pannus folds themselves were not that impressive. I've been managing this conservatively and the wound volume certainly has come down. We've been using Hydrofera Blue Objective Constitutional Patient is hypertensive.. Pulse regular and within target range for patient.Marland Kitchen Respirations regular, non-labored and within target range.. Temperature is normal and within the target range for the patient.Marland Kitchen Appears in no distress. Vitals Time Taken: 12:21 PM, Height: 69 in, Source: Stated, Weight: 222 lbs, Source: Stated, BMI: 32.8, Temperature: 98 F, Pulse: 72 bpm, Respiratory Rate: 18 breaths/min, Blood Pressure: 153/64 mmHg. General Notes: when exam; right abdomen in the middle of pannus folds although the pannus folds themselves are not all that impressive. Wound surfaces granular but friable. There is no hyper granulation today. No evidence of surrounding infection. There is no tenderness around the wound edge. Integumentary (Hair, Skin) Wound #1 status is Open. Original cause of wound was Gradually Appeared. The date acquired was: 02/07/2020. The wound has been in treatment 7 Adkins. The wound is located on the Right,Proximal,Lateral Abdomen - Lower Quadrant. The wound measures 1.2cm length x 2cm width x 0.1cm depth; 1.885cm^2 area and 0.188cm^3 volume. There is Fat Layer (Subcutaneous Tissue) exposed. There is no tunneling or undermining noted. There is a medium amount of serosanguineous drainage noted. The wound margin is flat and intact. There is large (67-100%) red, friable granulation within the wound bed. There is no  necrotic tissue within the wound bed. Assessment Active Problems ICD-10 Unspecified open wound of abdominal wall, right lower quadrant without penetration into peritoneal cavity, subsequent encounter Procedures Wound #1 Pre-procedure diagnosis of Wound #1 is an Abrasion located on the Right,Proximal,Lateral Abdomen - Lower Quadrant . An Chemical Cauterization procedure was performed by [name]., MD. Post procedure Diagnosis Wound #1: Same  as Pre-Procedure Plan Follow-up Appointments: Return Appointment in 2 Adkins. Bathing/ Shower/ Hygiene: May shower with protection but do not get wound dressing(s) wet. - May use soap and water in the shower Off-Loading: Turn and reposition every 2 hours WOUND #1: - Abdomen - Lower Quadrant Wound Laterality: Right, Lateral, Proximal Cleanser: Soap and Water 1 x Per Day/7 Days Discharge Instructions: May shower and wash wound with dial antibacterial soap and water prior to dressing change. Cleanser: Normal Saline (Generic) 1 x Per Day/7 Days Discharge Instructions: Cleanse the wound with Normal Saline prior to applying a clean dressing using gauze sponges, not tissue or cotton balls. Prim Dressing: KerraCel Ag Gelling Fiber Dressing, 4x5 in (silver alginate) (Generic) 1 x Per Day/7 Days ary Discharge Instructions: Apply silver alginate to wound bed as instructed Secondary Dressing: Woven Gauze Sponge, Non-Sterile 4x4 in (Generic) 1 x Per Day/7 Days Discharge Instructions: Apply over primary dressing as directed. Secondary Dressing: ABD Pad, 5x9 1 x Per Day/7 Days Discharge Instructions: Apply over primary dressing as directed. Secured With: 85M Medipore H Soft Cloth Surgical T 4 x 2 (in/yd) (Generic) 1 x Per Day/7 Days ape Discharge Instructions: Secure dressing with tape as directed. #1 a used silver nitrate on the friable surface of the wound. I have done this previously as well. #2still using silver alginate #3 the wound dimensions have  come down quite a bit. I'm still watching this. The underlying fibers the somewhat atypical nature of the wound surface and the fact that the pannus folds and cells are really not all that impressive. Nevertheless I have not yet biopsy this but consider this every time and certainly we will consider this if this involves Electronic Signature(s) Signed: 12/03/2020 9:50:14 AM By: Travis Ham MD Entered By: [name] on 12/02/2020 07:51:53 -------------------------------------------------------------------------------- SuperBill Details Patient Name: Date of Service: [name] 11/30/2020 Medical Record Number: [id number] Patient Account Number: [id number] Date of Birth/Sex: Treating RN: [birth year] (87 y.o. [name] Primary Care Provider: Marlana Adkins Other Clinician: Referring Provider: Treating Provider/Extender: [name] , [name] in Treatment: 7 Diagnosis Coding ICD-10 Codes Code Description S31.103D Unspecified open wound of abdominal wall, right lower quadrant without penetration into peritoneal cavity, subsequent encounter Facility Procedures CPT4 Code Description: [id number] - CHEM CAUT GRANULATION TISS ICD-10 Diagnosis Description S31.103D Unspecified open wound of abdominal wall, right lower quadrant without penetration subsequent encounter Modifier: into peritoneal cavit Quantity: 1 y, Physician Procedures : CPT4 Code Description Modifier [id number] - WC PHYS CHEM CAUT GRAN TISSUE ICD-10 Diagnosis Description S31.103D Unspecified open wound of abdominal wall, right lower quadrant without penetration into peritoneal cavit subsequent encounter Quantity: 1 y, Engineer, maintenance) Signed: 12/03/2020 9:50:14 AM By: Travis Ham MD Previous Signature: 11/30/2020 6:16:52 PM Version By: [name] Entered By: [name] on 12/02/2020 07:53:56

## 2020-12-05 NOTE — Progress Notes (Signed)
[name], [name] ([id number]) Visit Report for 11/30/2020 Arrival Information Details Patient Name: Date of Service: [name], [name] 11/30/2020 11:15 A M Medical Record Number: [id number] Patient Account Number: [id number] Date of Birth/Sex: Treating RN: 11-30-1930 (87 y.o. [name], [name] Primary Care Ahlayah Tarkowski: Lennie Odor , Mast Other Clinician: Referring Brenda Samano: Treating Teri Legacy/Extender: [name] , [name]: 7 Visit Information History Since Last Visit Added or deleted any medications: No Patient Arrived: Wheel Chair Any new allergies or adverse reactions: No Arrival Time: 12:20 Had a fall or experienced change in No Accompanied By: spouse activities of daily living that may affect Transfer Assistance: None risk of falls: Patient Identification Verified: Yes Signs or symptoms of abuse/neglect since last visito No Secondary Verification Process Completed: Yes Hospitalized since last visit: No Patient Requires Transmission-Based Precautions: No Implantable device outside of the clinic excluding No Patient Has Alerts: No cellular tissue based products placed in the center since last visit: Has Dressing in Place as Prescribed: Yes Pain Present Now: No Electronic Signature(s) Signed: 11/30/2020 6:33:47 PM By: Baruch Gouty RN, BSN Entered By: Baruch Gouty on 11/30/2020 12:20:56 -------------------------------------------------------------------------------- Lower Extremity Assessment Details Patient Name: Date of Service: [name] 11/30/2020 11:15 A M Medical Record Number: [id number] Patient Account Number: [id number] Date of Birth/Sex: Treating RN: February 18, 1931 (87 y.o. [name] Primary Care Dondi Aime: [name] Other Clinician: Referring Letcher Schweikert: Treating Fiona Coto/Extender: [name] , [name]: 7 Electronic Signature(s) Signed: 11/30/2020 6:33:47 PM By: Baruch Gouty RN,  BSN Entered By: Baruch Gouty on 11/30/2020 12:21:55 -------------------------------------------------------------------------------- Multi Wound Chart Details Patient Name: Date of Service: [name]. 11/30/2020 11:15 A M Medical Record Number: [id number] Patient Account Number: [id number] Date of Birth/Sex: Treating RN: 1931-03-27 (87 y.o. [name] Primary Care Elliona Doddridge: [name] Other Clinician: Referring Archana Eckman: Treating Charese Abundis/Extender: [name] , [name]: 7 Vital Signs Height(in): 55 Pulse(bpm): 51 Weight(lbs): 222 Blood Pressure(mmHg): 153/64 Body Mass Index(BMI): 33 Temperature(F): 98 Respiratory Rate(breaths/min): 18 Photos: [N/A:N/A] Right, Proximal, Lateral Abdomen - N/A N/A Wound Location: Lower Quadrant Gradually Appeared N/A N/A Wounding Event: Abrasion N/A N/A Primary Etiology: Cataracts, Anemia, Sleep Apnea, N/A N/A Comorbid History: Hypertension, Osteoarthritis, Neuropathy 02/07/2020 N/A N/A Date Acquired: 7 N/A N/A Weeks of Treatment: Open N/A N/A Wound Status: 1.2x2x0.1 N/A N/A Measurements L x W x D (cm) 1.885 N/A N/A A (cm) : rea 0.188 N/A N/A Volume (cm) : 85.70% N/A N/A % Reduction in Area: 85.70% N/A N/A % Reduction in Volume: Full Thickness Without Exposed N/A N/A Classification: Support Structures Medium N/A N/A Exudate Amount: Serosanguineous N/A N/A Exudate Type: red, brown N/A N/A Exudate Color: Flat and Intact N/A N/A Wound Margin: Large (67-100%) N/A N/A Granulation Amount: Red, Friable N/A N/A Granulation Quality: None Present (0%) N/A N/A Necrotic Amount: Fat Layer (Subcutaneous Tissue): Yes N/A N/A Exposed Structures: Fascia: No Tendon: No Muscle: No Joint: No Bone: No Small (1-33%) N/A N/A Epithelialization: Chemical Cauterization N/A N/A Procedures Performed: Treatment Notes Electronic Signature(s) Signed: 12/03/2020 9:50:14 AM By: Linton Ham MD Signed: 12/03/2020 5:20:59 PM By: Lorrin Jackson Entered By: Linton Ham on 12/02/2020 07:47:01 -------------------------------------------------------------------------------- Multi-Disciplinary Care Plan Details Patient Name: Date of Service: [name]. 11/30/2020 11:15 A M Medical Record Number: [id number] Patient Account Number: [id number] Date of Birth/Sex: Treating RN: 11-17-1930 (87 y.o. [name] Primary Care Jamarco Zaldivar: [name] Other Clinician: Referring Shamarr Faucett: Treating Miosha Behe/Extender: [name] , Mast [place]  in Treatment: 7 Active Inactive Orientation to the Wound Care Program Nursing Diagnoses: Knowledge deficit related to the wound healing center program Goals: Patient/caregiver will verbalize understanding of the [facility] Program Date Initiated: 10/09/2020 Target Resolution Date: 12/21/2020 Goal Status: Active Interventions: Provide education on orientation to the wound center Notes: Wound/Skin Impairment Nursing Diagnoses: Impaired tissue integrity Knowledge deficit related to ulceration/compromised skin integrity Goals: Patient/caregiver will verbalize understanding of skin care regimen Date Initiated: 10/09/2020 Target Resolution Date: 12/06/2020 Goal Status: Active Ulcer/skin breakdown will have a volume reduction of 30% by week 4 Date Initiated: 10/09/2020 Target Resolution Date: 12/06/2020 Goal Status: Active Interventions: Assess patient/caregiver ability to obtain necessary supplies Assess patient/caregiver ability to perform ulcer/skin care regimen upon admission and as needed Assess ulceration(s) every visit Notes: Electronic Signature(s) Signed: 11/30/2020 6:16:52 PM By: Lorrin Jackson Entered By: Lorrin Jackson on 11/30/2020 12:37:24 -------------------------------------------------------------------------------- Pain Assessment Details Patient Name: Date of Service: [name] 11/30/2020 11:15 A M Medical Record Number: [id number] Patient Account Number: [id number] Date of Birth/Sex: Treating RN: 03-Oct-1930 (87 y.o. [name] Primary Care Ottavio Norem: [name] Other Clinician: Referring Edithe Dobbin: Treating Kanchan Gal/Extender: [name] , [name]: 7 Active Problems Location of Pain Severity and Description of Pain Patient Has Paino No Site Locations Rate the pain. Rate the pain. Current Pain Level: 0 Pain Management and Medication Current Pain Management: Electronic Signature(s) Signed: 11/30/2020 6:33:47 PM By: Baruch Gouty RN, BSN Entered By: Baruch Gouty on 11/30/2020 12:21:49 -------------------------------------------------------------------------------- Patient/Caregiver Education Details Patient Name: Date of Service: [name] 3/25/2022andnbsp11:15 A M Medical Record Number: [id number] Patient Account Number: [id number] Date of Birth/Gender: Treating RN: 04/13/1931 (87 y.o. [name] Primary Care Physician: [name] Other Clinician: Referring Physician: Treating Physician/Extender: [name] , [name]: 7 Education Assessment Education Provided To: Patient Education Topics Provided Wound/Skin Impairment: Methods: Demonstration, Explain/Verbal, Printed Responses: State content correctly Electronic Signature(s) Signed: 11/30/2020 6:16:52 PM By: Lorrin Jackson Entered By: Lorrin Jackson on 11/30/2020 12:37:42 -------------------------------------------------------------------------------- Wound Assessment Details Patient Name: Date of Service: [name] 11/30/2020 11:15 A M Medical Record Number: [id number] Patient Account Number: [id number] Date of Birth/Sex: Treating RN: 11-28-1930 (87 y.o. [name] Primary Care Xzayvier Fagin: Lennie Odor , Mast Other Clinician: Referring Lilybeth Vien: Treating Marzetta Lanza/Extender: [name] , [name]: 7 Wound Status Wound Number: 1 Primary Abrasion Etiology: Wound Location: Right, Proximal, Lateral Abdomen - Lower Quadrant Wound Status: Open Wounding Event: Gradually Appeared Comorbid Cataracts, Anemia, Sleep Apnea, Hypertension, Osteoarthritis, Date Acquired: 02/07/2020 History: Neuropathy Weeks Of Treatment: 7 Clustered Wound: No Photos Wound Measurements Length: (cm) 1.2 Width: (cm) 2 Depth: (cm) 0.1 Area: (cm) 1.885 Volume: (cm) 0.188 % Reduction in Area: 85.7% % Reduction in Volume: 85.7% Epithelialization: Small (1-33%) Tunneling: No Undermining: No Wound Description Classification: Full Thickness Without Exposed Support Structures Wound Margin: Flat and Intact Exudate Amount: Medium Exudate Type: Serosanguineous Exudate Color: red, brown Foul Odor After Cleansing: No Slough/Fibrino No Wound Bed Granulation Amount: Large (67-100%) Exposed Structure Granulation Quality: Red, Friable Fascia Exposed: No Necrotic Amount: None Present (0%) Fat Layer (Subcutaneous Tissue) Exposed: Yes Tendon Exposed: No Muscle Exposed: No Joint Exposed: No Bone Exposed: No Electronic Signature(s) Signed: 11/30/2020 6:33:47 PM By: Baruch Gouty RN, BSN Signed: 12/05/2020 9:05:34 AM By: Sandre Kitty Entered By: Sandre Kitty on 11/30/2020 17:05:03 -------------------------------------------------------------------------------- Vitals Details Patient Name: Date of Service: Travis Costain C. 11/30/2020 11:15 A M Medical Record Number: [id number]  Patient Account Number: [id number] Date of Birth/Sex: Treating RN: [birth year] (87 y.o. [name] Primary Care Serena Petterson: Lennie Odor , Mast Other Clinician: Referring Edyn Popoca: Treating Damarri Rampy/Extender: [name] , [name]: 7 Vital Signs Time Taken: 12:21 Temperature (F): 98 Height (in): 69 Pulse (bpm): 72 Source: Stated Respiratory Rate  (breaths/min): 18 Weight (lbs): 222 Blood Pressure (mmHg): 153/64 Source: Stated Reference Range: 80 - 120 mg / dl Body Mass Index (BMI): 32.8 Electronic Signature(s) Signed: 11/30/2020 6:33:47 PM By: Baruch Gouty RN, BSN Entered By: Baruch Gouty on 11/30/2020 12:21:41

## 2020-12-07 ENCOUNTER — Non-Acute Institutional Stay: Payer: Medicare PPO | Admitting: Internal Medicine

## 2020-12-07 ENCOUNTER — Encounter: Payer: Self-pay | Admitting: Internal Medicine

## 2020-12-07 DIAGNOSIS — L8942 Pressure ulcer of contiguous site of back, buttock and hip, stage 2: Secondary | ICD-10-CM | POA: Diagnosis not present

## 2020-12-07 DIAGNOSIS — J301 Allergic rhinitis due to pollen: Secondary | ICD-10-CM | POA: Diagnosis not present

## 2020-12-07 NOTE — Progress Notes (Signed)
Location: [place] Room Number: [place] of Service:  ALF (13)  Provider:   Code Status:  Goals of Care:  Advanced Directives 07/11/2020  Does Patient Have a Medical Advance Directive? Yes  Type of Paramedic of [place];Living will;Out of facility DNR (pink MOST or yellow form)  Does patient want to make changes to medical advance directive? No - Patient declined  Copy of [place] in Chart? Yes - validated most recent copy scanned in chart (See row information)  Pre-existing out of facility DNR order (yellow form or pink MOST form) Pink MOST form placed in chart (order not valid for inpatient use)     Chief Complaint  Patient presents with  . Acute Visit    Cough    HPI: Patient is a 87 y.o. male seen today for an acute visit for Cough Runny nose  Patient has h/o Peripheral Neuropathy Idiopathic uses wheel chair , BPH with urinary Frequency,  LE edema,  Arthritis, Sleep Apnea, Hyperlipidemia  Wanted to be seen today for Runny nose, Cough productive  Denies fever, SOB,and chest Pain No nausea vomiting Patient lives in [place] no other acute issues He is seeing wound care for pressure ulcer on his inguinal folds  Past Medical History:  Diagnosis Date  . Abnormal MRI, knee    Per [facility] New Patient Packet   . Anemia   . Anticoagulated   . Anxiety   . Arthritis   . Cervical disc disorder    Per [facility] Patient Packet   . Complication of anesthesia    states "I was [place] for several weeks after surgery"caused by tramadol  . Constipation   . Dysuria 07/28/2016  . Edema 07/24/2016  . Gait abnormality 07/25/2016  . Hard of hearing   . Head injury    As a result of a fall, Per [facility] New Patient Packet   . Hip arthritis 07/21/2016  . History of fall 07/25/2016  . Hypercholesteremia   . Hypertension   . Lumbar spinal stenosis   . Memory loss 07/28/2016   mild  . Obesity    Per [facility] New Patient  Packet   . Osteoarthritis of right hip 07/24/2016  . Peripheral neuropathy 03/10/2018  . Sleep apnea    cpap  . Spinal stenosis of lumbar region 05/28/2016  . Urinary frequency   . Venous thrombosis of leg 07/24/2016   right gastrocnemius  . Vertigo   . Wears glasses   . Weight loss 04/08/2016    Past Surgical History:  Procedure Laterality Date  . BACK SURGERY  10/17, 80's  . COLONOSCOPY    . COLONOSCOPY     Per [facility] New Patient Packet, [place]   . ESOPHAGOGASTRODUODENOSCOPY    . LUMBAR LAMINECTOMY/DECOMPRESSION MICRODISCECTOMY Right 05/28/2016   Procedure: Right Lumbar Three-Four Microdiscectomy;  Surgeon: [name], MD;  Location: [place] NEURO ORS;  Service: Neurosurgery;  Laterality: Right;  . TOTAL HIP ARTHROPLASTY Right 07/21/2016   Procedure: TOTAL HIP ARTHROPLASTY ANTERIOR APPROACH;  Surgeon: [name], MD;  Location: [place];  Service: Orthopedics;  Laterality: Right;    Allergies  Allergen Reactions  . Tramadol Other (See Comments)    "does not eat" LOSS OF APPETITE    Outpatient Encounter Medications as of 12/07/2020  Medication Sig  . acetaminophen (TYLENOL) 500 MG tablet Take 1,000 mg by mouth every 8 (eight) hours as needed.  Marland Kitchen aspirin EC 81 MG tablet Take 81  mg by mouth daily. 8am.  . atorvastatin (LIPITOR) 20 MG tablet Take 20 mg by mouth daily. 8pm.  . Coenzyme Q10 (COQ10) 100 MG CAPS Take 1 capsule by mouth daily. 8am.  . Cyanocobalamin (B-12) 1000 MCG TABS Take 1 tablet by mouth every other day. 8am.  . diphenhydrAMINE (BENADRYL) 25 MG tablet Take 25 mg by mouth as needed.   . fexofenadine (ALLEGRA) 180 MG tablet Take 180 mg by mouth daily as needed for allergies or rhinitis.  . fluticasone (FLONASE) 50 MCG/ACT nasal spray Place 2 sprays into both nostrils as needed.   . furosemide (LASIX) 20 MG tablet Take 20 mg by mouth. Mon, Wed, Fri  . gabapentin (NEURONTIN) 100 MG capsule Take 100 mg by mouth at bedtime. 8pm.  . Glucosamine 500 MG CAPS Take by  mouth. Take 3 pills to equal 1500mg  daily.  . hydrocortisone 2.5 % cream Apply topically 2 (two) times daily as needed.  Marland Kitchen ketoconazole (NIZORAL) 2 % cream Apply 1 application topically 2 (two) times daily as needed for irritation.  Marland Kitchen levothyroxine (SYNTHROID) 50 MCG tablet Take 50 mcg by mouth daily before breakfast.  . meclizine (ANTIVERT) 25 MG tablet Take 25 mg by mouth 3 (three) times daily as needed for dizziness.  . meloxicam (MOBIC) 15 MG tablet Take 15 mg by mouth daily.   Marland Kitchen NAT-RUL PSYLLIUM SEED HUSKS PO Take 1 tablet by mouth as needed. For constipation.  Marland Kitchen omeprazole (PRILOSEC) 20 MG capsule Take 20 mg by mouth daily.  . potassium chloride (KLOR-CON) 10 MEQ tablet Take 10 mEq by mouth daily.  . sennosides-docusate sodium (SENOKOT-S) 8.6-50 MG tablet Take 1 tablet by mouth daily.  . sodium chloride (OCEAN) 0.65 % SOLN nasal spray Place 2 sprays into both nostrils as needed for congestion.  . tamsulosin (FLOMAX) 0.4 MG CAPS capsule Take 0.4 mg by mouth at bedtime.   No facility-administered encounter medications on file as of 12/07/2020.    Review of Systems:  Review of Systems  Constitutional: Negative.   HENT: Positive for congestion, postnasal drip and rhinorrhea.   Eyes: Negative.   Respiratory: Positive for cough.   Cardiovascular: Positive for leg swelling.  Gastrointestinal: Negative.   Genitourinary: Negative.   Musculoskeletal: Positive for gait problem.  Skin: Negative.   Neurological: Negative for dizziness.  Psychiatric/Behavioral: Negative.     Health Maintenance  Topic Date Due  . PNA vac Low Risk Adult (2 of 2 - PCV13) 04/20/2020  . INFLUENZA VACCINE  04/08/2021  . TETANUS/TDAP  05/16/2030  . COVID-19 Vaccine  Completed  . HPV VACCINES  Aged Out    Physical Exam: Vitals:   12/07/20 1403  BP: 120/70  Pulse: 72  Resp: 20  Temp: 98.3 F (36.8 C)  SpO2: 95%  Weight: 220 lb 6.4 oz (100 kg)  Height: 5\' 4"  (1.626 m)   Body mass index is 37.83  kg/m. Physical Exam  Constitutional: Oriented to person, place, and time. Well-developed and well-nourished.  HENT:  Head: Normocephalic.  Mouth/Throat: Oropharynx is clear and moist. No Sinus tenderness Eyes: Pupils are equal, round, and reactive to light.  Neck: Neck supple.  Cardiovascular: Normal rate and normal heart sounds.  No murmur heard. Pulmonary/Chest: Effort normal and breath sounds normal. No respiratory distress. No wheezes. he has no rales.  Abdominal: Soft. Bowel sounds are normal. No distension. There is no tenderness. There is no rebound.  Musculoskeletal: Mild edema Bilateral has ted hoses Lymphadenopathy: none Neurological: Alert and oriented to person, place,  and time.  Skin: Skin is warm and dry. Has 1x1.5 cm clear stage 2 Pressure wound in Right Inguinal folds Psychiatric: Normal mood and affect. Behavior is normal. Thought content normal.    Labs reviewed: Basic Metabolic Panel: Recent Labs    02/24/20 0000 03/22/20 0000 05/18/20 0000 09/07/20 0000 10/19/20 0000 11/06/20 0000  NA 141   < >  --  141 141 143  K 4.0   < >  --  3.9 4.6 4.0  CL 105   < >  --  105 106 105  CO2 29*   < >  --  29* 27* 29*  BUN 20   < >  --  20 19 19   CREATININE 0.9   < >  --  1.0 0.8 1.0  CALCIUM 9.0   < >  --  8.7 8.4* 9.1  TSH 7.57*  --  4.34  --  5.76  --    < > = values in this interval not displayed.   Liver Function Tests: Recent Labs    02/24/20 0000 09/07/20 0000 10/19/20 0000  AST 17 17 16   ALT 15 15 16   ALKPHOS 88 86 80  ALBUMIN 4.2 3.9 3.4*   No results for input(s): LIPASE, AMYLASE in the last 8760 hours. No results for input(s): AMMONIA in the last 8760 hours. CBC: Recent Labs    02/24/20 0000 09/07/20 0000 10/19/20 0000  WBC 5.3 5.1 4.4  NEUTROABS 3,222 3,545.00 2,794.00  HGB 13.8 13.4* 12.5*  HCT 42 40* 38*  PLT 171 170 148*   Lipid Panel: Recent Labs    02/24/20 0000  CHOL 139  HDL 37  LDLCALC 82  TRIG 100   No results found  for: HGBA1C  Procedures since last visit: No results found.  Assessment/Plan Possible  allergic rhinitis  Mucinex 600 mg BID prn Change Allegra and Flonase to QD Check Rapid Covid   Pressure injury of contiguous region involving right buttock and hip, stage 2 (HCC)  Seen Dr. Dellia Nims The wound size has decreased Clear margins Using Alginate with ABD dressing  Other issues Primary osteoarthritis involving multiple joints On Meloxicam. Refuses to stop Has Appointment with Dr Philipp Ovens Idiopathic peripheral neuropathy On Neurontin Uses Power Chair Hypothyroidism, unspecified type TSH normal Bilateral leg edema Doing well on Lasix Hyperlipidemia, unspecified hyperlipidemia type On Statin LDL less then 100 Urinary Frequency On Flomax Labs/tests ordered:  * No order type specified * Next appt:  Visit date not found

## 2020-12-14 ENCOUNTER — Encounter (HOSPITAL_BASED_OUTPATIENT_CLINIC_OR_DEPARTMENT_OTHER): Payer: Medicare PPO | Attending: Internal Medicine | Admitting: Internal Medicine

## 2020-12-14 ENCOUNTER — Other Ambulatory Visit: Payer: Self-pay

## 2020-12-14 DIAGNOSIS — E7800 Pure hypercholesterolemia, unspecified: Secondary | ICD-10-CM | POA: Diagnosis not present

## 2020-12-14 DIAGNOSIS — L98492 Non-pressure chronic ulcer of skin of other sites with fat layer exposed: Secondary | ICD-10-CM | POA: Diagnosis not present

## 2020-12-14 DIAGNOSIS — Z6832 Body mass index (BMI) 32.0-32.9, adult: Secondary | ICD-10-CM | POA: Diagnosis not present

## 2020-12-14 DIAGNOSIS — B379 Candidiasis, unspecified: Secondary | ICD-10-CM | POA: Diagnosis not present

## 2020-12-14 DIAGNOSIS — X58XXXD Exposure to other specified factors, subsequent encounter: Secondary | ICD-10-CM | POA: Diagnosis not present

## 2020-12-14 DIAGNOSIS — G629 Polyneuropathy, unspecified: Secondary | ICD-10-CM | POA: Insufficient documentation

## 2020-12-14 DIAGNOSIS — S31103D Unspecified open wound of abdominal wall, right lower quadrant without penetration into peritoneal cavity, subsequent encounter: Secondary | ICD-10-CM | POA: Diagnosis not present

## 2020-12-14 DIAGNOSIS — Z86718 Personal history of other venous thrombosis and embolism: Secondary | ICD-10-CM | POA: Diagnosis not present

## 2020-12-14 DIAGNOSIS — Z96641 Presence of right artificial hip joint: Secondary | ICD-10-CM | POA: Diagnosis not present

## 2020-12-14 DIAGNOSIS — Z87891 Personal history of nicotine dependence: Secondary | ICD-10-CM | POA: Diagnosis not present

## 2020-12-14 DIAGNOSIS — E669 Obesity, unspecified: Secondary | ICD-10-CM | POA: Diagnosis not present

## 2020-12-14 DIAGNOSIS — Z8249 Family history of ischemic heart disease and other diseases of the circulatory system: Secondary | ICD-10-CM | POA: Diagnosis not present

## 2020-12-14 DIAGNOSIS — I10 Essential (primary) hypertension: Secondary | ICD-10-CM | POA: Insufficient documentation

## 2020-12-14 DIAGNOSIS — E039 Hypothyroidism, unspecified: Secondary | ICD-10-CM | POA: Insufficient documentation

## 2020-12-14 DIAGNOSIS — G4730 Sleep apnea, unspecified: Secondary | ICD-10-CM | POA: Insufficient documentation

## 2020-12-14 NOTE — Progress Notes (Signed)
LLEWELLYN, CHOPLIN ([id number]) Visit Report for 12/14/2020 HPI Details Patient Name: Date of Service: [name], [name] 12/14/2020 11:00 A M Medical Record Number: [id number] Patient Account Number: [id number] Date of Birth/Sex: Treating RN: 26-Jan-1931 (87 y.o. [name] Primary Care Provider: Marlana Adkins Other Clinician: Referring Provider: Treating Provider/Extender: [name] , [name]: 9 History of Present Illness HPI Description: ADMISSION 10/09/2020 This is a pleasant 87 year old man from assisted living at [name] from [place] accompanied by his wife. They are here for a many month progression of the wounds in the right lower abdominal pannus fold. This initially was noted apparently in June 2021. There is some suggestion that he may have scratched this area and opened the wound but the patient does not remember doing this. They were using silver alginate in this area for a while but more recently a hydrocolloid. The area is not painful. There is also suggestion that they are using nystatin for a while however they are not doing this currently. The patient is fairly adamant that he is not allowing his waistband of his clothing to rub on this area Past medical history; idiopathic peripheral neuropathy, osteoarthritis, BPH, falls, lumbar spinal stenosis, sleep apnea, small vessel DVT hypertension, lower , extremity edema. The patient walks for short distances with a walker the rest of the time he is in a wheelchair. They moved into the assisted living order a [name] about 9 months ago previously were at independent level 2/15; right lower abdominal pannus fold wound. This was a bit atypical last week but I elected to treat this is the usual friction type wound in the pannus fold using silver alginate offloading the area etc. He had one larger area and a smaller area both of these seem better and surface area has improved 3/1; right  lower abdominal pannus fold. He presented with a bit of an atypical wound raised and hyper granular. However this is gradually improved in terms of condition of the wound surface as well as surface area with standard Adkins for a pannus/friction area wound. We have been using silver alginate they are changing this at [name] 3/25; little over 3 weeks since we've seen this man. He has been a typical wound in the right lateral abdomen in the middle of pannus folds however the pannus folds themselves were not that impressive. I've been managing this conservatively and the wound volume certainly has come down. We've been using Alginate 4/8; right lateral abdomen in the middle of the pannus folds however the pannus folds themselves are not that impressive. Somewhat friable surface we have been using silver alginate and this is gradually improving. I have always found the wound surface to be a bit atypical nevertheless as this is been gradually getting smaller I have spared the patient a biopsy Electronic Signature(s) Signed: 12/14/2020 4:45:31 PM By: Travis Ham MD Entered By: [name] on 12/14/2020 12:56:41 -------------------------------------------------------------------------------- Chemical Cauterization Details Patient Name: Date of Service: Travis Costain C. 12/14/2020 11:00 A M Medical Record Number: [id number] Patient Account Number: [id number] Date of Birth/Sex: Treating RN: 09/22/1930 (87 y.o. [name] Primary Care Provider: Marlana Adkins Other Clinician: Referring Provider: Treating Provider/Extender: [name] , [name]: 9 Procedure Performed for: Wound #1 Right,Proximal,Lateral Abdomen - Lower Quadrant Performed By: Physician [name]., MD Post Procedure Diagnosis Same as Pre-procedure Electronic Signature(s) Signed: 12/14/2020 4:45:31 PM By: Travis Ham MD Signed: 12/14/2020 4:45:31 PM By:  Travis Ham MD Entered By: [name] on 12/14/2020 12:55:54 -------------------------------------------------------------------------------- Physical Exam Details Patient Name: Date of Service: [name], [name] 12/14/2020 11:00 A M Medical Record Number: [id number] Patient Account Number: [id number] Date of Birth/Sex: Treating RN: 03-11-1931 (87 y.o. [name] Primary Care Provider: Marlana Adkins Other Clinician: Referring Provider: Treating Provider/Extender: [name] , [name]: 9 Constitutional Sitting or standing Blood Pressure is within target range for patient.. Pulse regular and within target range for patient.Marland Kitchen Respirations regular, non-labored and within target range.Marland Kitchen Appears in no distress. Notes Wound exam; right abdomen in the middle of pannus folds. The pannus folds themselves are unremarkable although it almost looks like he has had a surgical procedure in this area he denies it. The wound surface has surrounding epithelialization but still looks friable and a bit unusual I use silver nitrate on the surface of the Electronic Signature(s) Signed: 12/14/2020 4:45:31 PM By: Travis Ham MD Entered By: [name] on 12/14/2020 12:57:35 -------------------------------------------------------------------------------- Physician Orders Details Patient Name: Date of Service: Travis Costain C. 12/14/2020 11:00 A M Medical Record Number: [id number] Patient Account Number: [id number] Date of Birth/Sex: Treating RN: 1931-08-27 (87 y.o. [name] Primary Care Provider: Marlana Adkins Other Clinician: Referring Provider: Treating Provider/Extender: [name] , [name]: 9 Verbal / Phone Orders: No Diagnosis Coding ICD-10 Coding Code Description S31.103D Unspecified open wound of abdominal wall, right lower quadrant without penetration into peritoneal cavity, subsequent encounter Follow-up  Appointments Return appointment in 3 weeks. Bathing/ Shower/ Hygiene May shower with protection but do not get wound dressing(s) wet. - May use soap and water in the shower Off-Loading Turn and reposition every 2 hours Wound [facility] Wound #1 - Abdomen - Lower Quadrant Wound Laterality: Right, Lateral, Proximal Cleanser: Soap and Water Every Other Day/7 Days Discharge Instructions: May shower and wash wound with dial antibacterial soap and water prior to dressing change. Cleanser: Normal Saline (Generic) Every Other Day/7 Days Discharge Instructions: Cleanse the wound with Normal Saline prior to applying a clean dressing using gauze sponges, not tissue or cotton balls. Prim Dressing: Hydrofera Blue Ready Foam, 2.5 x2.5 in ary Every Other Day/7 Days Discharge Instructions: Apply to wound bed as instructed Secondary Dressing: Woven Gauze Sponge, Non-Sterile 4x4 in (Generic) Every Other Day/7 Days Discharge Instructions: Apply over primary dressing as directed. Secondary Dressing: ABD Pad, 5x9 Every Other Day/7 Days Discharge Instructions: Apply over primary dressing as directed. Secured With: 36M Medipore H Soft Cloth Surgical T 4 x 2 (in/yd) (Generic) Every Other Day/7 Days ape Discharge Instructions: Secure dressing with tape as directed. Electronic Signature(s) Signed: 12/14/2020 4:45:31 PM By: Travis Ham MD Signed: 12/14/2020 5:03:53 PM By: Lorrin Jackson Previous Signature: 12/14/2020 12:00:56 PM Version By: Lorrin Jackson Entered By: Lorrin Jackson on 12/14/2020 12:15:01 -------------------------------------------------------------------------------- Problem List Details Patient Name: Date of Service: [name], [name] 12/14/2020 11:00 A M Medical Record Number: [id number] Patient Account Number: [id number] Date of Birth/Sex: Treating RN: 05-04-1931 (87 y.o. [name] Primary Care Provider: Marlana Adkins Other Clinician: Referring Provider: Treating Provider/Extender:  [name] , [name]: 9 Active Problems ICD-10 Encounter Code Description Active Date MDM Diagnosis S31.103D Unspecified open wound of abdominal wall, right lower quadrant without 10/09/2020 No Yes penetration into peritoneal cavity, subsequent encounter Inactive Problems Resolved Problems Electronic Signature(s) Signed: 12/14/2020 4:45:31 PM By: Travis Ham MD Previous Signature: 12/14/2020 12:00:34 PM Version By: Lorrin Jackson Entered By: [name]  on 12/14/2020 12:55:38 -------------------------------------------------------------------------------- Progress Note Details Patient Name: Date of Service: [name], [name] 12/14/2020 11:00 A M Medical Record Number: [id number] Patient Account Number: [id number] Date of Birth/Sex: Treating RN: [birth year] (87 y.o. [name] Primary Care Provider: Marlana Adkins Other Clinician: Referring Provider: Treating Provider/Extender: [name] , [name]: 9 Subjective History of Present Illness (HPI) ADMISSION 10/09/2020 This is a pleasant 87 year old man from assisted living at [name] from [place] accompanied by his wife. They are here for a many month progression of the wounds in the right lower abdominal pannus fold. This initially was noted apparently in June 2021. There is some suggestion that he may have scratched this area and opened the wound but the patient does not remember doing this. They were using silver alginate in this area for a while but more recently a hydrocolloid. The area is not painful. There is also suggestion that they are using nystatin for a while however they are not doing this currently. The patient is fairly adamant that he is not allowing his waistband of his clothing to rub on this area Past medical history; idiopathic peripheral neuropathy, osteoarthritis, BPH, falls, lumbar spinal stenosis, sleep apnea, small vessel DVT hypertension,  lower , extremity edema. The patient walks for short distances with a walker the rest of the time he is in a wheelchair. They moved into the assisted living order a [name] about 9 months ago previously were at independent level 2/15; right lower abdominal pannus fold wound. This was a bit atypical last week but I elected to treat this is the usual friction type wound in the pannus fold using silver alginate offloading the area etc. He had one larger area and a smaller area both of these seem better and surface area has improved 3/1; right lower abdominal pannus fold. He presented with a bit of an atypical wound raised and hyper granular. However this is gradually improved in terms of condition of the wound surface as well as surface area with standard Adkins for a pannus/friction area wound. We have been using silver alginate they are changing this at [facility] 3/25; little over 3 weeks since we've seen this man. He has been a typical wound in the right lateral abdomen in the middle of pannus folds however the pannus folds themselves were not that impressive. I've been managing this conservatively and the wound volume certainly has come down. We've been using Alginate 4/8; right lateral abdomen in the middle of the pannus folds however the pannus folds themselves are not that impressive. Somewhat friable surface we have been using silver alginate and this is gradually improving. I have always found the wound surface to be a bit atypical nevertheless as this is been gradually getting smaller I have spared the patient a biopsy Objective Constitutional Sitting or standing Blood Pressure is within target range for patient.. Pulse regular and within target range for patient.Marland Kitchen Respirations regular, non-labored and within target range.Marland Kitchen Appears in no distress. Vitals Time Taken: 11:53 AM, Height: 69 in, Weight: 222 lbs, BMI: 32.8, Temperature: 98.0 F, Pulse: 71 bpm,  Respiratory Rate: 18 breaths/min, Blood Pressure: 138/79 mmHg. General Notes: Wound exam; right abdomen in the middle of pannus folds. The pannus folds themselves are unremarkable although it almost looks like he has had a surgical procedure in this area he denies it. The wound surface has surrounding epithelialization but still looks friable and a bit unusual I use silver nitrate on the  surface of the Integumentary (Hair, Skin) Wound #1 status is Open. Original cause of wound was Gradually Appeared. The date acquired was: 02/07/2020. The wound has been in [facility] 9 weeks. The wound is located on the Right,Proximal,Lateral Abdomen - Lower Quadrant. The wound measures 1cm length x 1.8cm width x 0.1cm depth; 1.414cm^2 area and 0.141cm^3 volume. There is Fat Layer (Subcutaneous Tissue) exposed. There is no tunneling or undermining noted. There is a medium amount of serosanguineous drainage noted. The wound margin is flat and intact. There is large (67-100%) red, friable granulation within the wound bed. There is no necrotic tissue within the wound bed. Assessment Active Problems ICD-10 Unspecified open wound of abdominal wall, right lower quadrant without penetration into peritoneal cavity, subsequent encounter Procedures Wound #1 Pre-procedure diagnosis of Wound #1 is an Abrasion located on the Right,Proximal,Lateral Abdomen - Lower Quadrant . An Chemical Cauterization procedure was performed by [name]., MD. Post procedure Diagnosis Wound #1: Same as Pre-Procedure Plan Follow-up Appointments: Return appointment in 3 weeks. Bathing/ Shower/ Hygiene: May shower with protection but do not get wound dressing(s) wet. - May use soap and water in the shower Off-Loading: Turn and reposition every 2 hours WOUND #1: - Abdomen - Lower Quadrant Wound Laterality: Right, Lateral, Proximal Cleanser: Soap and Water Every Other Day/7 Days Discharge Instructions: May shower and wash wound with  dial antibacterial soap and water prior to dressing change. Cleanser: Normal Saline (Generic) Every Other Day/7 Days Discharge Instructions: Cleanse the wound with Normal Saline prior to applying a clean dressing using gauze sponges, not tissue or cotton balls. Prim Dressing: Hydrofera Blue Ready Foam, 2.5 x2.5 in Every Other Day/7 Days ary Discharge Instructions: Apply to wound bed as instructed Secondary Dressing: Woven Gauze Sponge, Non-Sterile 4x4 in (Generic) Every Other Day/7 Days Discharge Instructions: Apply over primary dressing as directed. Secondary Dressing: ABD Pad, 5x9 Every Other Day/7 Days Discharge Instructions: Apply over primary dressing as directed. Secured With: 31M Medipore H Soft Cloth Surgical T 4 x 2 (in/yd) (Generic) Every Other Day/7 Days ape Discharge Instructions: Secure dressing with tape as directed. 1. I change the dressing to Hydrofera Blue 2. Still find myself wondering about a shave biopsy if this is not continue towards closure or enlarges I will proceed with this Electronic Signature(s) Signed: 12/14/2020 4:45:31 PM By: Travis Ham MD Entered By: [name] on 12/14/2020 12:58:06 -------------------------------------------------------------------------------- SuperBill Details Patient Name: Date of Service: [name] 12/14/2020 Medical Record Number: [id number] Patient Account Number: [id number] Date of Birth/Sex: Treating RN: [birth year] (87 y.o. [name] Primary Care Provider: Marlana Adkins Other Clinician: Referring Provider: Treating Provider/Extender: [name] , [name]: 9 Diagnosis Coding ICD-10 Codes Code Description S31.103D Unspecified open wound of abdominal wall, right lower quadrant without penetration into peritoneal cavity, subsequent encounter Facility Procedures CPT4 Code Description: [id number] [place] VISIT-LEV 3 EST PT Modifier: Quantity: 1 CPT4 Code Description:  [id number] - CHEM CAUT GRANULATION TISS ICD-10 Diagnosis Description S31.103D Unspecified open wound of abdominal wall, right lower quadrant without penetration into subsequent encounter Modifier: peritoneal cavit Quantity: 1 y, Physician Procedures : CPT4 Code Description Modifier [id number] - WC PHYS CHEM CAUT GRAN TISSUE ICD-10 Diagnosis Description S31.103D Unspecified open wound of abdominal wall, right lower quadrant without penetration into peritoneal cavit subsequent encounter Quantity: 1 y, Engineer, maintenance) Signed: 12/14/2020 4:45:31 PM By: Travis Ham MD Previous Signature: 12/14/2020 12:17:06 PM Version By: Lorrin Jackson Entered By: [name] on 12/14/2020  12:58:47 

## 2020-12-17 NOTE — Progress Notes (Signed)
Adkins, Travis ([id number]) Visit Report for 12/14/2020 Arrival Information Details Patient Name: Date of Service: [name], [name] 12/14/2020 11:00 A M Medical Record Number: [id number] Patient Account Number: [id number] Date of Birth/Sex: Treating RN: April 24, 1931 (86 y.o. [name] Primary Care Audia Amick: [name] Other Clinician: Referring Heyward Douthit: Treating Ed Rayson/Extender: [name] , [name]: 9 Visit Information History Since Last Visit Added or deleted any medications: No Patient Arrived: Ambulatory Any new allergies or adverse reactions: No Arrival Time: 11:51 Had a fall or experienced change in No Accompanied By: self activities of daily living that may affect Transfer Assistance: None risk of falls: Patient Identification Verified: Yes Signs or symptoms of abuse/neglect since last visito No Secondary Verification Process Completed: Yes Hospitalized since last visit: No Patient Requires Transmission-Based Precautions: No Implantable device outside of the clinic excluding No Patient Has Alerts: No cellular tissue based products placed in the center since last visit: Has Dressing in Place as Prescribed: Yes Pain Present Now: No Electronic Signature(s) Signed: 12/14/2020 2:27:05 PM By: Sandre Kitty Entered By: Sandre Kitty on 12/14/2020 11:53:00 -------------------------------------------------------------------------------- Clinic Level of Care Assessment Details Patient Name: Date of Service: Adkins, Travis 12/14/2020 11:00 A M Medical Record Number: [id number] Patient Account Number: [id number] Date of Birth/Sex: Treating RN: [birth year] (87 y.o. [name] Primary Care Felice Deem: [name] Other Clinician: Referring Xaviar Lunn: Treating Ajai Harville/Extender: [name] , [name]: 9 Clinic Level of Care Assessment Items TOOL 4 Quantity Score X- 1 0 Use when only an EandM is  performed on FOLLOW-UP visit ASSESSMENTS - Nursing Assessment / Reassessment X- 1 10 Reassessment of Co-morbidities (includes updates in patient status) X- 1 5 Reassessment of Adherence to Treatment Plan ASSESSMENTS - Wound and Skin A ssessment / Reassessment X - Simple Wound Assessment / Reassessment - one wound 1 5 _0  - 0 Complex Wound Assessment / Reassessment - multiple wounds _1  - 0 Dermatologic / Skin Assessment (not related to wound area) ASSESSMENTS - Focused Assessment _2  - 0 Circumferential Edema Measurements - multi extremities _3  - 0 Nutritional Assessment / Counseling / Intervention _4  - 0 Lower Extremity Assessment (monofilament, tuning fork, pulses) _5  - 0 Peripheral Arterial Disease Assessment (using hand held doppler) ASSESSMENTS - Ostomy and/or Continence Assessment and Care _6  - 0 Incontinence Assessment and Management _7  - 0 Ostomy Care Assessment and Management (repouching, etc.) PROCESS - Coordination of Care _8  - 0 Simple Patient / Family Education for ongoing care X- 1 20 Complex (extensive) Patient / Family Education for ongoing care X- 1 10 Staff obtains Programmer, systems, Records, T Results / Process Orders est X- 1 10 Staff telephones HHA, Nursing Homes / Clarify orders / etc _9  - 0 Routine Transfer to another Facility (non-emergent condition) _10  - 0 Routine Hospital Admission (non-emergent condition) _11  - 0 New Admissions / Biomedical engineer / Ordering NPWT Apligraf, etc. , _12  - 0 Emergency Hospital Admission (emergent condition) _13  - 0 Simple Discharge Coordination _14  - 0 Complex (extensive) Discharge Coordination PROCESS - Special Needs _15  - 0 Pediatric / Minor Patient Management _16  - 0 Isolation Patient Management _17  - 0 Hearing / Language / Visual special needs _18  - 0 Assessment of Community assistance (transportation, D/C planning, etc.) _19  - 0 Additional assistance / Altered mentation _20  - 0 Support Surface(s) Assessment  (bed, cushion, seat, etc.) INTERVENTIONS - Wound Cleansing / Measurement X - Simple Wound Cleansing - one wound 1 5 _21  - 0 Complex Wound Cleansing -  multiple wounds X- 1 5 Wound Imaging (photographs - any number of wounds) _0  - 0 Wound Tracing (instead of photographs) X- 1 5 Simple Wound Measurement - one wound _1  - 0 Complex Wound Measurement - multiple wounds INTERVENTIONS - Wound Dressings X - Small Wound Dressing one or multiple wounds 1 10 _2  - 0 Medium Wound Dressing one or multiple wounds _3  - 0 Large Wound Dressing one or multiple wounds X- 1 5 Application of Medications - topical <[id number]>_4<\/[id number]>_8  - 0 Application of Medications - injection INTERVENTIONS - Miscellaneous _5  - 0 External ear exam _6  - 0 Specimen Collection (cultures, biopsies, blood, body fluids, etc.) _7  - 0 Specimen(s) / Culture(s) sent or taken to Lab for analysis _8  - 0 Patient Transfer (multiple staff / Civil Service fast streamer / Similar devices) _9  - 0 Simple Staple / Suture removal (25 or less) _10  - 0 Complex Staple / Suture removal (26 or more) _11  - 0 Hypo / Hyperglycemic Management (close monitor of Blood Glucose) _12  - 0 Ankle / Brachial Index (ABI) - do not check if billed separately X- 1 5 Vital Signs Has the patient been seen at the hospital within the last three years: Yes Total Score: 95 Level Of Care: New/Established - Level 3 Electronic Signature(s) Signed: 12/14/2020 5:03:53 PM By: Lorrin Jackson Entered By: Lorrin Jackson on 12/14/2020 12:16:58 -------------------------------------------------------------------------------- Encounter Discharge Information Details Patient Name: Date of Service: Davene Costain C. 12/14/2020 11:00 A M Medical Record Number: [id number] Patient Account Number: [id number] Date of Birth/Sex: Treating RN: 10-27-1930 (87 y.o. [name] Primary Care Arseniy Toomey: [name] Other Clinician: Referring Dimarco Minkin: Treating Rayme Bui/Extender: [name] ,  [name]: 9 Encounter Discharge Information Items Discharge Condition: Stable Ambulatory Status: Wheelchair Discharge Destination: Home Transportation: Private Auto Accompanied By: wife Schedule Follow-up Appointment: Yes Clinical Summary of Care: Electronic Signature(s) Signed: 12/14/2020 4:53:51 PM By: Deon Pilling Entered By: Deon Pilling on 12/14/2020 15:41:37 -------------------------------------------------------------------------------- Multi Wound Chart Details Patient Name: Date of Service: Davene Costain C. 12/14/2020 11:00 A M Medical Record Number: [id number] Patient Account Number: [id number] Date of Birth/Sex: Treating RN: Feb 05, 1931 (87 y.o. [name] Primary Care Emric Kowalewski: [name] Other Clinician: Referring Eleanor Dimichele: Treating Marasia Newhall/Extender: [name] , [name]: 9 Vital Signs Height(in): 69 Pulse(bpm): 71 Weight(lbs): 222 Blood Pressure(mmHg): 138/79 Body Mass Index(BMI): 33 Temperature(F): 98.0 Respiratory Rate(breaths/min): 18 Photos: [1:No Photos Right, Proximal, Lateral Abdomen -] [N/A:N/A N/A] Wound Location: [1:Lower Quadrant Gradually Appeared] [N/A:N/A] Wounding Event: [1:Abrasion] [N/A:N/A] Primary Etiology: [1:Cataracts, Anemia, Sleep Apnea,] [N/A:N/A] Comorbid History: [1:Hypertension, Osteoarthritis, Neuropathy 02/07/2020] [N/A:N/A] Date Acquired: [1:9] [N/A:N/A] Weeks of Treatment: [1:Open] [N/A:N/A] Wound Status: [1:1x1.8x0.1] [N/A:N/A] Measurements L x W x D (cm) [1:1.414] [N/A:N/A] A (cm) : rea [1:0.141] [N/A:N/A] Volume (cm) : [1:89.30%] [N/A:N/A] % Reduction in Area: [1:89.30%] [N/A:N/A] % Reduction in Volume: [1:Full Thickness Without Exposed] [N/A:N/A] Classification: [1:Support Structures Medium] [N/A:N/A] Exudate Amount: [1:Serosanguineous] [N/A:N/A] Exudate Type: [1:red, brown] [N/A:N/A] Exudate Color: [1:Flat and Intact] [N/A:N/A] Wound Margin: [1:Large (67-100%)]  [N/A:N/A] Granulation Amount: [1:Red, Friable] [N/A:N/A] Granulation Quality: [1:None Present (0%)] [N/A:N/A] Necrotic Amount: [1:Fat Layer (Subcutaneous Tissue): Yes N/A] Exposed Structures: [1:Fascia: No Tendon: No Muscle: No Joint: No Bone: No Small (1-33%)] [N/A:N/A] Epithelialization: [1:Chemical Cauterization] [N/A:N/A] Treatment Notes Electronic Signature(s) Signed: 12/14/2020 4:45:31 PM By: Linton Ham MD Signed: 12/14/2020 5:03:53 PM By: Lorrin Jackson Entered By: Linton Ham on 12/14/2020 12:55:44 -------------------------------------------------------------------------------- Multi-Disciplinary Care Plan Details Patient Name: Date of Service: Retta Diones, Garrus C. 12/14/2020 11:00 A  M Medical Record Number: [id number] Patient Account Number: [id number] Date of Birth/Sex: Treating RN: Nov 11, 1930 (87 y.o. [name] Primary Care Zanetta Dehaan: [name] Other Clinician: Referring Densel Kronick: Treating Kynadi Dragos/Extender: [name] , [name]: 9 Active Inactive Orientation to the Wound Care Program Nursing Diagnoses: Knowledge deficit related to the wound healing center program Goals: Patient/caregiver will verbalize understanding of the [facility] Program Date Initiated: 10/09/2020 Target Resolution Date: 12/21/2020 Goal Status: Active Interventions: Provide education on orientation to the wound center Notes: Wound/Skin Impairment Nursing Diagnoses: Impaired tissue integrity Knowledge deficit related to ulceration/compromised skin integrity Goals: Patient/caregiver will verbalize understanding of skin care regimen Date Initiated: 10/09/2020 Target Resolution Date: 01/18/2021 Goal Status: Active Ulcer/skin breakdown will have a volume reduction of 30% by week 4 Date Initiated: 10/09/2020 Date Inactivated: 12/14/2020 Target Resolution Date: 11/30/2020 Goal Status: Met Ulcer/skin breakdown will have a volume reduction of 50% by  week 8 Date Initiated: 12/14/2020 Target Resolution Date: 01/04/2021 Goal Status: Active Interventions: Assess patient/caregiver ability to obtain necessary supplies Assess patient/caregiver ability to perform ulcer/skin care regimen upon admission and as needed Assess ulceration(s) every visit Notes: 12/14/20: Wound greater than 30% volume reduction, new goal initiated. Electronic Signature(s) Signed: 12/14/2020 12:03:18 PM By: Lorrin Jackson Previous Signature: 12/14/2020 12:01:18 PM Version By: Lorrin Jackson Entered By: Lorrin Jackson on 12/14/2020 12:03:17 -------------------------------------------------------------------------------- Pain Assessment Details Patient Name: Date of Service: [name] 12/14/2020 11:00 A M Medical Record Number: [id number] Patient Account Number: [id number] Date of Birth/Sex: Treating RN: 1931-03-22 (87 y.o. [name] Primary Care Kendan Cornforth: [name] Other Clinician: Referring Kevan Prouty: Treating Mona Ayars/Extender: [name] , [name]: 9 Active Problems Location of Pain Severity and Description of Pain Patient Has Paino No Site Locations Pain Management and Medication Current Pain Management: Electronic Signature(s) Signed: 12/14/2020 2:27:05 PM By: Sandre Kitty Signed: 12/14/2020 5:03:53 PM By: Lorrin Jackson Entered By: Sandre Kitty on 12/14/2020 11:53:25 -------------------------------------------------------------------------------- Patient/Caregiver Education Details Patient Name: Date of Service: CRA BTREE, Kamir C. 4/8/2022andnbsp11:00 A M Medical Record Number: [id number] Patient Account Number: [id number] Date of Birth/Gender: Treating RN: 07-16-1931 (87 y.o. [name] Primary Care Physician: [name] Other Clinician: Referring Physician: Treating Physician/Extender: [name] , [name]: 9 Education Assessment Education Provided  To: Patient Education Topics Provided Wound/Skin Impairment: Methods: Demonstration, Explain/Verbal, Printed Responses: State content correctly Electronic Signature(s) Signed: 12/14/2020 5:03:53 PM By: Lorrin Jackson Entered By: Lorrin Jackson on 12/14/2020 12:01:45 -------------------------------------------------------------------------------- Wound Assessment Details Patient Name: Date of Service: [name] 12/14/2020 11:00 A M Medical Record Number: [id number] Patient Account Number: [id number] Date of Birth/Sex: Treating RN: 04-12-1931 (87 y.o. [name] Primary Care Edmundo Tedesco: [name] Other Clinician: Referring Alva Kuenzel: Treating Gabriana Wilmott/Extender: [name] , [name]: 9 Wound Status Wound Number: 1 Primary Abrasion Etiology: Wound Location: Right, Proximal, Lateral Abdomen - Lower Quadrant Wound Status: Open Wounding Event: Gradually Appeared Comorbid Cataracts, Anemia, Sleep Apnea, Hypertension, Osteoarthritis, Date Acquired: 02/07/2020 History: Neuropathy Weeks Of Treatment: 9 Clustered Wound: No Photos Wound Measurements Length: (cm) 1 Width: (cm) 1.8 Depth: (cm) 0.1 Area: (cm) 1.414 Volume: (cm) 0.141 % Reduction in Area: 89.3% % Reduction in Volume: 89.3% Epithelialization: Small (1-33%) Tunneling: No Undermining: No Wound Description Classification: Full Thickness Without Exposed Support Structures Wound Margin: Flat and Intact Exudate Amount: Medium Exudate Type: Serosanguineous Exudate Color: red, brown Foul Odor After Cleansing: No Slough/Fibrino No Wound Bed Granulation Amount: Large (67-100%)  Exposed Structure Granulation Quality: Red, Friable Fascia Exposed: No Necrotic Amount: None Present (0%) Fat Layer (Subcutaneous Tissue) Exposed: Yes Tendon Exposed: No Muscle Exposed: No Joint Exposed: No Bone Exposed: No Treatment Notes Wound #1 (Abdomen - Lower Quadrant) Wound Laterality: Right,  Lateral, Proximal Cleanser Soap and Water Discharge Instruction: May shower and wash wound with dial antibacterial soap and water prior to dressing change. Normal Saline Discharge Instruction: Cleanse the wound with Normal Saline prior to applying a clean dressing using gauze sponges, not tissue or cotton balls. Peri-Wound Care Topical Primary Dressing Hydrofera Blue Ready Foam, 2.5 x2.5 in Discharge Instruction: Apply to wound bed as instructed Secondary Dressing Woven Gauze Sponge, Non-Sterile 4x4 in Discharge Instruction: Apply over primary dressing as directed. ABD Pad, 5x9 Discharge Instruction: Apply over primary dressing as directed. Secured With 14M Medipore H Soft Cloth Surgical T 4 x 2 (in/yd) ape Discharge Instruction: Secure dressing with tape as directed. Compression Wrap Compression Stockings Add-Ons Electronic Signature(s) Signed: 12/14/2020 5:03:53 PM By: Lorrin Jackson Signed: 12/17/2020 7:58:59 AM By: Sandre Kitty Entered By: Sandre Kitty on 12/14/2020 16:27:29 -------------------------------------------------------------------------------- Vitals Details Patient Name: Date of Service: [place], [place] 12/14/2020 11:00 A M Medical Record Number: [id number] Patient Account Number: [id number] Date of Birth/Sex: Treating RN: [birth year] (87 y.o. [name] Primary Care Afrika Brick: [name] Other Clinician: Referring Prescilla Monger: Treating Idona Stach/Extender: [name] , [name]: 9 Vital Signs Time Taken: 11:53 Temperature (F): 98.0 Height (in): 69 Pulse (bpm): 71 Weight (lbs): 222 Respiratory Rate (breaths/min): 18 Body Mass Index (BMI): 32.8 Blood Pressure (mmHg): 138/79 Reference Range: 80 - 120 mg / dl Electronic Signature(s) Signed: 12/14/2020 2:27:05 PM By: Sandre Kitty Entered By: Sandre Kitty on 12/14/2020 11:53:21

## 2021-01-04 ENCOUNTER — Encounter (HOSPITAL_BASED_OUTPATIENT_CLINIC_OR_DEPARTMENT_OTHER): Payer: Medicare PPO | Admitting: Internal Medicine

## 2021-01-04 ENCOUNTER — Other Ambulatory Visit: Payer: Self-pay

## 2021-01-04 DIAGNOSIS — S31103D Unspecified open wound of abdominal wall, right lower quadrant without penetration into peritoneal cavity, subsequent encounter: Secondary | ICD-10-CM

## 2021-01-04 DIAGNOSIS — E669 Obesity, unspecified: Secondary | ICD-10-CM | POA: Diagnosis not present

## 2021-01-04 DIAGNOSIS — B379 Candidiasis, unspecified: Secondary | ICD-10-CM

## 2021-01-04 DIAGNOSIS — G4730 Sleep apnea, unspecified: Secondary | ICD-10-CM | POA: Diagnosis not present

## 2021-01-04 DIAGNOSIS — G629 Polyneuropathy, unspecified: Secondary | ICD-10-CM | POA: Diagnosis not present

## 2021-01-04 DIAGNOSIS — E7800 Pure hypercholesterolemia, unspecified: Secondary | ICD-10-CM | POA: Diagnosis not present

## 2021-01-04 DIAGNOSIS — I10 Essential (primary) hypertension: Secondary | ICD-10-CM | POA: Diagnosis not present

## 2021-01-04 DIAGNOSIS — E039 Hypothyroidism, unspecified: Secondary | ICD-10-CM | POA: Diagnosis not present

## 2021-01-04 DIAGNOSIS — Z87891 Personal history of nicotine dependence: Secondary | ICD-10-CM | POA: Diagnosis not present

## 2021-01-04 NOTE — Progress Notes (Signed)
RAGE, BEEVER ([id number]) Visit Report for 01/04/2021 Chief Complaint Document Details Patient Name: Date of Service: [name], [name] 01/04/2021 2:30 PM Medical Record Number: [id number] Patient Account Number: [id number] Date of Birth/Sex: Treating RN: October 17, 1930 (87 y.o. [name] Primary Care Provider: [name] Other Clinician: Referring Provider: Treating Provider/Extender: Donald Pore , [name]: 12 Information Obtained from: Patient Chief Complaint 10/09/2020; patient is here for review of wounds and a lower abdominal pannus on the right Electronic Signature(s) Signed: 01/04/2021 4:51:17 PM By: Kalman Shan DO Entered By: Kalman Shan on 01/04/2021 16:30:56 -------------------------------------------------------------------------------- HPI Details Patient Name: Date of Service: [name]. 01/04/2021 2:30 PM Medical Record Number: [id number] Patient Account Number: [id number] Date of Birth/Sex: Treating RN: Feb 18, 1931 (87 y.o. [name] Primary Care Provider: [name] Other Clinician: Referring Provider: Treating Provider/Extender: Donald Pore , [name]: 12 History of Present Illness HPI Description: ADMISSION 10/09/2020 This is a pleasant 67 year old man from assisted living at [facility] from [place] accompanied by his wife. They are here for a many month progression of the wounds in the right lower abdominal pannus fold. This initially was noted apparently in June 2021. There is some suggestion that he may have scratched this area and opened the wound but the patient does not remember doing this. They were using silver alginate in this area for a while but more recently a hydrocolloid. The area is not painful. There is also suggestion that they are using nystatin for a while however they are not doing this currently. The patient is fairly adamant that he is not allowing his  waistband of his clothing to rub on this area Past medical history; idiopathic peripheral neuropathy, osteoarthritis, BPH, falls, lumbar spinal stenosis, sleep apnea, small vessel DVT hypertension, lower , extremity edema. The patient walks for short distances with a walker the rest of the time he is in a wheelchair. They moved into the assisted living order a [facility] home [place] about 9 months ago previously were at independent level 2/15; right lower abdominal pannus fold wound. This was a bit atypical last week but I elected to treat this is the usual friction type wound in the pannus fold using silver alginate offloading the area etc. He had one larger area and a smaller area both of these seem better and surface area has improved 3/1; right lower abdominal pannus fold. He presented with a bit of an atypical wound raised and hyper granular. However this is gradually improved in terms of condition of the wound surface as well as surface area with standard treatment for a pannus/friction area wound. We have been using silver alginate they are changing this at friend[facility] home [place] 3/25; little over 3 weeks since we've seen this man. He has been a typical wound in the right lateral abdomen in the middle of pannus folds however the pannus folds themselves were not that impressive. I've been managing this conservatively and the wound volume certainly has come down. We've been using Alginate 4/8; right lateral abdomen in the middle of the pannus folds however the pannus folds themselves are not that impressive. Somewhat friable surface we have been using silver alginate and this is gradually improving. I have always found the wound surface to be a bit atypical nevertheless as this is been gradually getting smaller I have spared the patient a biopsy 4/29; patient presents for 3-week follow-up. He has been using Hydrofera Blue to the abdominal wound and  has noticed improvement. He has developed a  rash along his groin. He says it started yesterday. They have tried antifungal cream on it. Electronic Signature(s) Signed: 01/04/2021 4:51:17 PM By: Kalman Shan DO Entered By: Kalman Shan on 01/04/2021 16:32:49 -------------------------------------------------------------------------------- Physical Exam Details Patient Name: Date of Service: [name], [name] 01/04/2021 2:30 PM Medical Record Number: [id number] Patient Account Number: [id number] Date of Birth/Sex: Treating RN: 09/24/1930 (87 y.o. [name] Primary Care Provider: [name] Other Clinician: Referring Provider: Treating Provider/Extender: Donald Pore , [name]: 12 Constitutional respirations regular, non-labored and within target range for patient.Marland Kitchen Psychiatric pleasant and cooperative. Notes Abdomen: Right side shows a wound that is well-healing with healthy granulation tissue present. He also has a yeast infection to his right groin. Electronic Signature(s) Signed: 01/04/2021 4:51:17 PM By: Kalman Shan DO Entered By: Kalman Shan on 01/04/2021 16:32:20 -------------------------------------------------------------------------------- Physician Orders Details Patient Name: Date of Service: [name] 01/04/2021 2:30 PM Medical Record Number: [id number] Patient Account Number: [id number] Date of Birth/Sex: Treating RN: [birth year] (87 y.o. [name] Primary Care Provider: [name] Other Clinician: Referring Provider: Treating Provider/Extender: Donald Pore , [name]: 70 Verbal / Phone Orders: No Diagnosis Coding ICD-10 Coding Code Description S31.103D Unspecified open wound of abdominal wall, right lower quadrant without penetration into peritoneal cavity, subsequent encounter Follow-up Appointments ppointment in 2 weeks. - with Dr. Dellia Nims Return A Bathing/ Shower/ Hygiene May shower with protection but  do not get wound dressing(s) wet. - May use soap and water in the shower Off-Loading Turn and reposition every 2 hours Wound Treatment Wound #1 - Abdomen - Lower Quadrant Wound Laterality: Right, Lateral, Proximal Cleanser: Soap and Water Every Other Day Discharge Instructions: May shower and wash wound with dial antibacterial soap and water prior to dressing change. Peri-Wound Care: Tinactin Powder or Nystatin Powder Every Other Day Discharge Instructions: T periwound and Groin o Prim Dressing: Hydrofera Blue Ready Foam, 2.5 x2.5 in Every Other Day ary Discharge Instructions: Apply to wound bed as instructed Secondary Dressing: ABD Pad, 5x9 Every Other Day Discharge Instructions: Apply over primary dressing as directed. Secured With: 26M Medipore H Soft Cloth Surgical T 4 x 2 (in/yd) Every Other Day ape Discharge Instructions: Secure dressing with tape as directed. Notes Resides at [facility] Signature(s) Signed: 01/04/2021 4:51:17 PM By: Kalman Shan DO Previous Signature: 01/04/2021 3:19:10 PM Version By: Lorrin Jackson Entered By: Kalman Shan on 01/04/2021 16:45:53 -------------------------------------------------------------------------------- Problem List Details Patient Name: Date of Service: [name]. 01/04/2021 2:30 PM Medical Record Number: [id number] Patient Account Number: [id number] Date of Birth/Sex: Treating RN: [birth year] (87 y.o. [name] Primary Care Provider: [name] Other Clinician: Referring Provider: Treating Provider/Extender: Donald Pore , [name]: 12 Active Problems ICD-10 Encounter Code Description Active Date MDM Diagnosis S31.103D Unspecified open wound of abdominal wall, right lower quadrant without 10/09/2020 No Yes penetration into peritoneal cavity, subsequent encounter B37.9 Candidiasis, unspecified 01/04/2021 No Yes Inactive Problems Resolved  Problems Electronic Signature(s) Signed: 01/04/2021 4:51:17 PM By: Kalman Shan DO Previous Signature: 01/04/2021 3:18:25 PM Version By: Lorrin Jackson Entered By: Kalman Shan on 01/04/2021 16:49:45 -------------------------------------------------------------------------------- Progress Note Details Patient Name: Date of Service: [name] 01/04/2021 2:30 PM Medical Record Number: [id number] Patient Account Number: [id number] Date of Birth/Sex: Treating RN: 10/14/1930 (87 y.o. [name] Primary Care Provider: [name] Other Clinician: Referring Provider: Treating Provider/Extender:  Kalman Shan Manxie , [name]: 12 Subjective Chief Complaint Information obtained from Patient 10/09/2020; patient is here for review of wounds and a lower abdominal pannus on the right History of Present Illness (HPI) ADMISSION 10/09/2020 This is a pleasant 75 year old man from assisted living at friends from [place] accompanied by his wife. They are here for a many month progression of the wounds in the right lower abdominal pannus fold. This initially was noted apparently in June 2021. There is some suggestion that he may have scratched this area and opened the wound but the patient does not remember doing this. They were using silver alginate in this area for a while but more recently a hydrocolloid. The area is not painful. There is also suggestion that they are using nystatin for a while however they are not doing this currently. The patient is fairly adamant that he is not allowing his waistband of his clothing to rub on this area Past medical history; idiopathic peripheral neuropathy, osteoarthritis, BPH, falls, lumbar spinal stenosis, sleep apnea, small vessel DVT hypertension, lower , extremity edema. The patient walks for short distances with a walker the rest of the time he is in a wheelchair. They moved into the assisted living order a friend's  home [place] about 9 months ago previously were at independent level 2/15; right lower abdominal pannus fold wound. This was a bit atypical last week but I elected to treat this is the usual friction type wound in the pannus fold using silver alginate offloading the area etc. He had one larger area and a smaller area both of these seem better and surface area has improved 3/1; right lower abdominal pannus fold. He presented with a bit of an atypical wound raised and hyper granular. However this is gradually improved in terms of condition of the wound surface as well as surface area with standard treatment for a pannus/friction area wound. We have been using silver alginate they are changing this at friend's home [place] 3/25; little over 3 weeks since we've seen this man. He has been a typical wound in the right lateral abdomen in the middle of pannus folds however the pannus folds themselves were not that impressive. I've been managing this conservatively and the wound volume certainly has come down. We've been using Alginate 4/8; right lateral abdomen in the middle of the pannus folds however the pannus folds themselves are not that impressive. Somewhat friable surface we have been using silver alginate and this is gradually improving. I have always found the wound surface to be a bit atypical nevertheless as this is been gradually getting smaller I have spared the patient a biopsy 4/29; patient presents for 3-week follow-up. He has been using Hydrofera Blue to the abdominal wound and has noticed improvement. He has developed a rash along his groin. He says it started yesterday. They have tried antifungal cream on it. Patient History Information obtained from Patient. Family History Cancer - Siblings, Diabetes - Siblings, Hypertension - Mother, No family history of Heart Disease, Hereditary Spherocytosis, Kidney Disease, Lung Disease, Seizures, Stroke, Thyroid Problems, Tuberculosis. Social  History Former smoker - quit 1960s, Marital Status - Married, Alcohol Use - Never, Drug Use - No History, Caffeine Use - Rarely. Medical History Eyes Patient has history of Cataracts - mild Denies history of Glaucoma, Optic Neuritis Ear/Nose/Mouth/Throat Denies history of Chronic sinus problems/congestion, Middle ear problems Hematologic/Lymphatic Patient has history of Anemia Respiratory Patient has history of Sleep Apnea Cardiovascular Patient has history of Hypertension  Genitourinary Denies history of End Stage Renal Disease Integumentary (Skin) Denies history of History of Burn Musculoskeletal Patient has history of Osteoarthritis Denies history of Gout, Rheumatoid Arthritis, Osteomyelitis Neurologic Patient has history of Neuropathy Oncologic Denies history of Received Chemotherapy, Received Radiation Psychiatric Denies history of Anorexia/bulimia, Confinement Anxiety Hospitalization/Surgery History - back surgery. - lumbar laminectomy/decompression. - right total hip arthroplasty. Medical A Surgical History Notes nd Constitutional Symptoms (General Health) obesity Cardiovascular hypercholesteremia, venous thrombus right gastrocnemius Endocrine hypothyroidism Musculoskeletal spinal stenosis, cervical disc disorder Objective Constitutional respirations regular, non-labored and within target range for patient.. Vitals Time Taken: 3:13 PM, Height: 69 in, Source: Stated, Weight: 222 lbs, Source: Stated, BMI: 32.8, Temperature: 98.3 F, Pulse: 73 bpm, Respiratory Rate: 18 breaths/min, Blood Pressure: 150/70 mmHg. Psychiatric pleasant and cooperative. General Notes: Abdomen: Right side shows a wound that is well-healing with healthy granulation tissue present. He also has a yeast infection to his right groin. Integumentary (Hair, Skin) Wound #1 status is Open. Original cause of wound was Gradually Appeared. The date acquired was: 02/07/2020. The wound has been in  treatment 12 weeks. The wound is located on the Right,Proximal,Lateral Abdomen - Lower Quadrant. The wound measures 0.2cm length x 1.1cm width x 0.1cm depth; 0.173cm^2 area and 0.017cm^3 volume. There is Fat Layer (Subcutaneous Tissue) exposed. There is no tunneling or undermining noted. There is a small amount of serosanguineous drainage noted. The wound margin is flat and intact. There is medium (34-66%) red granulation within the wound bed. There is a medium (34- 66%) amount of necrotic tissue within the wound bed including Adherent Slough. Assessment Active Problems ICD-10 Unspecified open wound of abdominal wall, right lower quadrant without penetration into peritoneal cavity, subsequent encounter Candidiasis, unspecified Patient presents with improvement in wound size and appearance since last clinic visit. There are no signs of infection. We will continue with Hydrofera Blue. He also presents with a yeast infection to his right groin. Will order nystatin cream for him. Plan Follow-up Appointments: Return Appointment in 2 weeks. - with Dr. Arcola Jansky Shower/ Hygiene: May shower with protection but do not get wound dressing(s) wet. - May use soap and water in the shower Off-Loading: Turn and reposition every 2 hours General Notes: Resides at [place] #1: - Abdomen - Lower Quadrant Wound Laterality: Right, Lateral, Proximal Cleanser: Soap and Water Every Other Day/ Discharge Instructions: May shower and wash wound with dial antibacterial soap and water prior to dressing change. Peri-Wound Care: Tinactin Powder or Nystatin Powder Every Other Day/ Discharge Instructions: T periwound and Groin o Prim Dressing: Hydrofera Blue Ready Foam, 2.5 x2.5 in Every Other Day/ ary Discharge Instructions: Apply to wound bed as instructed Secondary Dressing: ABD Pad, 5x9 Every Other Day/ Discharge Instructions: Apply over primary dressing as directed. Secured With:  4M Medipore H Soft Cloth Surgical T 4 x 2 (in/yd) Every Other Day/ ape Discharge Instructions: Secure dressing with tape as directed. 1. Hydrofera Blue 2. Nystatin cream Electronic Signature(s) Signed: 01/04/2021 4:51:17 PM By: Kalman Shan DO Entered By: Kalman Shan on 01/04/2021 16:50:38 -------------------------------------------------------------------------------- HxROS Details Patient Name: Date of Service: Travis Costain C. 01/04/2021 2:30 PM Medical Record Number: [id number] Patient Account Number: [id number] Date of Birth/Sex: Treating RN: 10-26-1930 (87 y.o. [name] Primary Care Provider: [name] Other Clinician: Referring Provider: Treating Provider/Extender: Donald Pore , [name]: 12 Information Obtained From Patient Constitutional Symptoms (General Health) Medical History: Past Medical History Notes: obesity Eyes Medical History: Positive  for: Cataracts - mild Negative for: Glaucoma; Optic Neuritis Ear/Nose/Mouth/Throat Medical History: Negative for: Chronic sinus problems/congestion; Middle ear problems Hematologic/Lymphatic Medical History: Positive for: Anemia Respiratory Medical History: Positive for: Sleep Apnea Cardiovascular Medical History: Positive for: Hypertension Past Medical History Notes: hypercholesteremia, venous thrombus right gastrocnemius Endocrine Medical History: Past Medical History Notes: hypothyroidism Genitourinary Medical History: Negative for: End Stage Renal Disease Integumentary (Skin) Medical History: Negative for: History of Burn Musculoskeletal Medical History: Positive for: Osteoarthritis Negative for: Gout; Rheumatoid Arthritis; Osteomyelitis Past Medical History Notes: spinal stenosis, cervical disc disorder Neurologic Medical History: Positive for: Neuropathy Oncologic Medical History: Negative for: Received Chemotherapy; Received  Radiation Psychiatric Medical History: Negative for: Anorexia/bulimia; Confinement Anxiety HBO Extended History Items Eyes: Cataracts Immunizations Pneumococcal Vaccine: Received Pneumococcal Vaccination: Yes Tetanus Vaccine: Last tetanus shot: 04/22/2002 Implantable Devices No devices added Hospitalization / Surgery History Type of Hospitalization/Surgery back surgery lumbar laminectomy/decompression right total hip arthroplasty Family and Social History Cancer: Yes - Siblings; Diabetes: Yes - Siblings; Heart Disease: No; Hereditary Spherocytosis: No; Hypertension: Yes - Mother; Kidney Disease: No; Lung Disease: No; Seizures: No; Stroke: No; Thyroid Problems: No; Tuberculosis: No; Former smoker - quit 1960s; Marital Status - Married; Alcohol Use: Never; Drug Use: No History; Caffeine Use: Rarely; Financial Concerns: No; Food, Clothing or Shelter Needs: No; Support System Lacking: No; Transportation Concerns: No Electronic Signature(s) Signed: 01/04/2021 4:51:17 PM By: Kalman Shan DO Signed: 01/04/2021 5:13:52 PM By: Lorrin Jackson Entered By: Kalman Shan on 01/04/2021 16:31:47 -------------------------------------------------------------------------------- SuperBill Details Patient Name: Date of Service: [name] 01/04/2021 Medical Record Number: [id number] Patient Account Number: [id number] Date of Birth/Sex: Treating RN: November 26, 1930 (87 y.o. [name] Primary Care Provider: [name] Other Clinician: Referring Provider: Treating Provider/Extender: Donald Pore , [name]: 12 Diagnosis Coding ICD-10 Codes Code Description S31.103D Unspecified open wound of abdominal wall, right lower quadrant without penetration into peritoneal cavity, subsequent encounter B37.9 Candidiasis, unspecified Facility Procedures Electronic Signature(s) Signed: 01/04/2021 4:51:17 PM By: Kalman Shan DO Entered By: Kalman Shan  on 01/04/2021 16:50:46

## 2021-01-07 NOTE — Progress Notes (Addendum)
[name], [name] ([id number]) Visit Report for 01/04/2021 Arrival Information Details Patient Name: Date of Service: [name], [name] 01/04/2021 2:30 PM Medical Record Number: [id number] Patient Account Number: [id number] Date of Birth/Sex: Treating RN: 1931-01-24 (87 y.o. [name], [name] Primary Care Anagha Loseke: Lennie Odor , Mast Other Clinician: Referring Rylyn Ranganathan: Treating Sonora Catlin/Extender: Donald Pore , [name]: 12 Visit Information History Since Last Visit Added or deleted any medications: No Patient Arrived: Wheel Chair Any new allergies or adverse reactions: No Arrival Time: 15:05 Had a fall or experienced change in No Accompanied By: spouse activities of daily living that may affect Transfer Assistance: None risk of falls: Patient Identification Verified: Yes Signs or symptoms of abuse/neglect since last visito No Secondary Verification Process Completed: Yes Hospitalized since last visit: No Patient Requires Transmission-Based Precautions: No Implantable device outside of the clinic excluding No Patient Has Alerts: No cellular tissue based products placed in the center since last visit: Has Dressing in Place as Prescribed: Yes Pain Present Now: No Electronic Signature(s) Signed: 01/04/2021 5:21:08 PM By: Baruch Gouty RN, BSN Entered By: Baruch Gouty on 01/04/2021 15:13:12 -------------------------------------------------------------------------------- Clinic Level of Care Assessment Details Patient Name: Date of Service: [name], [name] 01/04/2021 2:30 PM Medical Record Number: [id number] Patient Account Number: [id number] Date of Birth/Sex: Treating RN: [birth year] (87 y.o. [name] Primary Care Tayloranne Lekas: [name] Other Clinician: Referring Odelle Kosier: Treating Tocarra Gassen/Extender: Donald Pore , [name]: 12 Clinic Level of Care Assessment Items TOOL 4 Quantity Score X- 1 0 Use when  only an EandM is performed on FOLLOW-UP visit ASSESSMENTS - Nursing Assessment / Reassessment X- 1 10 Reassessment of Co-morbidities (includes updates in patient status) X- 1 5 Reassessment of Adherence to Treatment Plan ASSESSMENTS - Wound and Skin A ssessment / Reassessment '[]'  - 0 Simple Wound Assessment / Reassessment - one wound X- 2 5 Complex Wound Assessment / Reassessment - multiple wounds '[]'  - 0 Dermatologic / Skin Assessment (not related to wound area) ASSESSMENTS - Focused Assessment '[]'  - 0 Circumferential Edema Measurements - multi extremities '[]'  - 0 Nutritional Assessment / Counseling / Intervention '[]'  - 0 Lower Extremity Assessment (monofilament, tuning fork, pulses) '[]'  - 0 Peripheral Arterial Disease Assessment (using hand held doppler) ASSESSMENTS - Ostomy and/or Continence Assessment and Care '[]'  - 0 Incontinence Assessment and Management '[]'  - 0 Ostomy Care Assessment and Management (repouching, etc.) PROCESS - Coordination of Care '[]'  - 0 Simple Patient / Family Education for ongoing care X- 1 20 Complex (extensive) Patient / Family Education for ongoing care X- 1 10 Staff obtains Programmer, systems, Records, T Results / Process Orders est '[]'  - 0 Staff telephones HHA, Nursing Homes / Clarify orders / etc '[]'  - 0 Routine Transfer to another Facility (non-emergent condition) '[]'  - 0 Routine Hospital Admission (non-emergent condition) '[]'  - 0 New Admissions / Biomedical engineer / Ordering NPWT Apligraf, etc. , '[]'  - 0 Emergency Hospital Admission (emergent condition) '[]'  - 0 Simple Discharge Coordination '[]'  - 0 Complex (extensive) Discharge Coordination PROCESS - Special Needs '[]'  - 0 Pediatric / Minor Patient Management '[]'  - 0 Isolation Patient Management '[]'  - 0 Hearing / Language / Visual special needs '[]'  - 0 Assessment of Community assistance (transportation, D/C planning, etc.) '[]'  - 0 Additional assistance / Altered mentation '[]'  - 0 Support  Surface(s) Assessment (bed, cushion, seat, etc.) INTERVENTIONS - Wound Cleansing / Measurement '[]'  - 0 Simple Wound Cleansing - one wound X- 2 5 Complex Wound Cleansing -  multiple wounds X- 1 5 Wound Imaging (photographs - any number of wounds) '[]'  - 0 Wound Tracing (instead of photographs) '[]'  - 0 Simple Wound Measurement - one wound X- 2 5 Complex Wound Measurement - multiple wounds INTERVENTIONS - Wound Dressings X - Small Wound Dressing one or multiple wounds 2 10 '[]'  - 0 Medium Wound Dressing one or multiple wounds '[]'  - 0 Large Wound Dressing one or multiple wounds '[]'  - 0 Application of Medications - topical '[]'  - 0 Application of Medications - injection INTERVENTIONS - Miscellaneous '[]'  - 0 External ear exam '[]'  - 0 Specimen Collection (cultures, biopsies, blood, body fluids, etc.) '[]'  - 0 Specimen(s) / Culture(s) sent or taken to Lab for analysis '[]'  - 0 Patient Transfer (multiple staff / Civil Service fast streamer / Similar devices) '[]'  - 0 Simple Staple / Suture removal (25 or less) '[]'  - 0 Complex Staple / Suture removal (26 or more) '[]'  - 0 Hypo / Hyperglycemic Management (close monitor of Blood Glucose) '[]'  - 0 Ankle / Brachial Index (ABI) - do not check if billed separately X- 1 5 Vital Signs Has the patient been seen at the hospital within the last three years: Yes Total Score: 105 Level Of Care: New/Established - Level 3 Electronic Signature(s) Signed: 01/04/2021 5:13:52 PM By: Lorrin Jackson Entered By: Lorrin Jackson on 01/04/2021 15:56:13 -------------------------------------------------------------------------------- Encounter Discharge Information Details Patient Name: Date of Service: [name]. 01/04/2021 2:30 PM Medical Record Number: [id number] Patient Account Number: [id number] Date of Birth/Sex: Treating RN: [birth year] (87 y.o. [name] Primary Care Celes Dedic: [name] Other Clinician: Referring Jaleiah Asay: Treating Zeah Germano/Extender: Donald Pore , [name]: 12 Encounter Discharge Information Items Discharge Condition: Stable Ambulatory Status: Wheelchair Discharge Destination: Home Transportation: Private Auto Accompanied By: wife Schedule Follow-up Appointment: Yes Clinical Summary of Care: Electronic Signature(s) Signed: 01/04/2021 5:30:47 PM By: Deon Pilling Entered By: Deon Pilling on 01/04/2021 17:24:54 -------------------------------------------------------------------------------- Lower Extremity Assessment Details Patient Name: Date of Service: [name], [name] 01/04/2021 2:30 PM Medical Record Number: [id number] Patient Account Number: [id number] Date of Birth/Sex: Treating RN: 19-Jan-1931 (87 y.o. Ernestene Mention Primary Care Kimble Hitchens: [name] Other Clinician: Referring Jenica Costilow: Treating Sausha Raymond/Extender: Donald Pore , [name]: 12 Electronic Signature(s) Signed: 01/04/2021 5:21:08 PM By: Baruch Gouty RN, BSN Entered By: Baruch Gouty on 01/04/2021 15:14:07 -------------------------------------------------------------------------------- Multi Wound Chart Details Patient Name: Date of Service: [name] 01/04/2021 2:30 PM Medical Record Number: [id number] Patient Account Number: [id number] Date of Birth/Sex: Treating RN: [birth year] (87 y.o. [name] Primary Care Lamiah Marmol: [name] Other Clinician: Referring Yasenia Reedy: Treating Kristi Hyer/Extender: Donald Pore , [name]: 12 Vital Signs Height(in): 36 Pulse(bpm): 62 Weight(lbs): 222 Blood Pressure(mmHg): 150/70 Body Mass Index(BMI): 33 Temperature(F): 98.3 Respiratory Rate(breaths/min): 18 Photos: [N/A:N/A] Right, Proximal, Lateral Abdomen - N/A N/A Wound Location: Lower Quadrant Gradually Appeared N/A N/A Wounding Event: Abrasion N/A N/A Primary Etiology: Cataracts, Anemia, Sleep Apnea, N/A N/A Comorbid  History: Hypertension, Osteoarthritis, Neuropathy 02/07/2020 N/A N/A Date Acquired: 12 N/A N/A Weeks of Treatment: Open N/A N/A Wound Status: 0.2x1.1x0.1 N/A N/A Measurements L x W x D (cm) 0.173 N/A N/A A (cm) : rea 0.017 N/A N/A Volume (cm) : 98.70% N/A N/A % Reduction in Area: 98.70% N/A N/A % Reduction in Volume: Full Thickness Without Exposed N/A N/A Classification: Support Structures Small N/A N/A Exudate Amount: Serosanguineous N/A N/A Exudate Type: red, brown N/A N/A Exudate Color: Flat and Intact  N/A N/A Wound Margin: Medium (34-66%) N/A N/A Granulation Amount: Red N/A N/A Granulation Quality: Medium (34-66%) N/A N/A Necrotic Amount: Fat Layer (Subcutaneous Tissue): Yes N/A N/A Exposed Structures: Fascia: No Tendon: No Muscle: No Joint: No Bone: No Small (1-33%) N/A N/A Epithelialization: Treatment Notes Electronic Signature(s) Signed: 01/04/2021 4:51:17 PM By: Kalman Shan DO Signed: 01/04/2021 5:13:52 PM By: Lorrin Jackson Entered By: Kalman Shan on 01/04/2021 16:50:05 -------------------------------------------------------------------------------- Multi-Disciplinary Care Plan Details Patient Name: Date of Service: [name]. 01/04/2021 2:30 PM Medical Record Number: [id number] Patient Account Number: [id number] Date of Birth/Sex: Treating RN: [birth year] (87 y.o. [name] Primary Care Jru Pense: [name] Other Clinician: Referring Caitlyne Ingham: Treating Markisha Meding/Extender: Donald Pore , [name]: 12 Active Inactive Wound/Skin Impairment Nursing Diagnoses: Impaired tissue integrity Knowledge deficit related to ulceration/compromised skin integrity Goals: Patient/caregiver will verbalize understanding of skin care regimen Date Initiated: 10/09/2020 Target Resolution Date: 01/18/2021 Goal Status: Active Ulcer/skin breakdown will have a volume reduction of 30% by week 4 Date Initiated:  10/09/2020 Date Inactivated: 12/14/2020 Target Resolution Date: 11/30/2020 Goal Status: Met Ulcer/skin breakdown will have a volume reduction of 50% by week 8 Date Initiated: 12/14/2020 Target Resolution Date: 01/04/2021 Goal Status: Active Interventions: Assess patient/caregiver ability to obtain necessary supplies Assess patient/caregiver ability to perform ulcer/skin care regimen upon admission and as needed Assess ulceration(s) every visit Notes: 12/14/20: Wound greater than 30% volume reduction, new goal initiated. Electronic Signature(s) Signed: 01/04/2021 3:19:22 PM By: Lorrin Jackson Entered By: Lorrin Jackson on 01/04/2021 15:19:22 -------------------------------------------------------------------------------- Pain Assessment Details Patient Name: Date of Service: [name], [name] 01/04/2021 2:30 PM Medical Record Number: [id number] Patient Account Number: [id number] Date of Birth/Sex: Treating RN: [birth year] (87 y.o. Ernestene Mention Primary Care Gjon Letarte: [name] Other Clinician: Referring Jemya Depierro: Treating Dazia Lippold/Extender: Donald Pore , [name]: 12 Active Problems Location of Pain Severity and Description of Pain Patient Has Paino No Site Locations Rate the pain. Current Pain Level: 0 Pain Management and Medication Current Pain Management: Electronic Signature(s) Signed: 01/04/2021 5:21:08 PM By: Baruch Gouty RN, BSN Entered By: Baruch Gouty on 01/04/2021 15:14:00 -------------------------------------------------------------------------------- Patient/Caregiver Education Details Patient Name: Date of Service: [name] 4/29/2022andnbsp2:30 PM Medical Record Number: [id number] Patient Account Number: [id number] Date of Birth/Gender: Treating RN: 07/17/1931 (87 y.o. [name] Primary Care Physician: [name] Other Clinician: Referring Physician: Treating Physician/Extender: Donald Pore , [name]: 12 Education Assessment Education Provided To: Patient Education Topics Provided Wound/Skin Impairment: Methods: Demonstration, Explain/Verbal, Printed Responses: State content correctly Electronic Signature(s) Signed: 01/04/2021 5:13:52 PM By: Lorrin Jackson Entered By: Lorrin Jackson on 01/04/2021 15:19:57 -------------------------------------------------------------------------------- Wound Assessment Details Patient Name: Date of Service: [name] 01/04/2021 2:30 PM Medical Record Number: [id number] Patient Account Number: [id number] Date of Birth/Sex: Treating RN: [birth year] (87 y.o. Ernestene Mention Primary Care Loy Mccartt: [name] Other Clinician: Referring Daley Mooradian: Treating Lumi Winslett/Extender: Donald Pore , [name]: 12 Wound Status Wound Number: 1 Primary Abrasion Etiology: Wound Location: Right, Proximal, Lateral Abdomen - Lower Quadrant Wound Status: Open Wounding Event: Gradually Appeared Comorbid Cataracts, Anemia, Sleep Apnea, Hypertension, Osteoarthritis, Date Acquired: 02/07/2020 History: Neuropathy Weeks Of Treatment: 12 Clustered Wound: No Photos Wound Measurements Length: (cm) 0.2 Width: (cm) 1.1 Depth: (cm) 0.1 Area: (cm) 0.173 Volume: (cm) 0.017 % Reduction in Area: 98.7% % Reduction in Volume: 98.7% Epithelialization: Small (1-33%) Tunneling: No Undermining: No Wound Description Classification: Full Thickness Without Exposed Support  Structures Wound Margin: Flat and Intact Exudate Amount: Small Exudate Type: Serosanguineous Exudate Color: red, brown Foul Odor After Cleansing: No Slough/Fibrino No Wound Bed Granulation Amount: Medium (34-66%) Exposed Structure Granulation Quality: Red Fascia Exposed: No Necrotic Amount: Medium (34-66%) Fat Layer (Subcutaneous Tissue) Exposed: Yes Necrotic Quality: Adherent Slough Tendon Exposed: No Muscle Exposed:  No Joint Exposed: No Bone Exposed: No Treatment Notes Wound #1 (Abdomen - Lower Quadrant) Wound Laterality: Right, Lateral, Proximal Cleanser Soap and Water Discharge Instruction: May shower and wash wound with dial antibacterial soap and water prior to dressing change. Peri-Wound Care Tinactin Powder or Nystatin Powder Discharge Instruction: T periwound and Groin o Topical Primary Dressing Hydrofera Blue Ready Foam, 2.5 x2.5 in Discharge Instruction: Apply to wound bed as instructed Secondary Dressing ABD Pad, 5x9 Discharge Instruction: Apply over primary dressing as directed. Secured With 76M Medipore H Soft Cloth Surgical T 4 x 2 (in/yd) ape Discharge Instruction: Secure dressing with tape as directed. Compression Wrap Compression Stockings Add-Ons Electronic Signature(s) Signed: 01/04/2021 4:35:52 PM By: Sandre Kitty Signed: 01/04/2021 5:21:08 PM By: Baruch Gouty RN, BSN Entered By: Sandre Kitty on 01/04/2021 16:28:27 -------------------------------------------------------------------------------- Vitals Details Patient Name: Date of Service: [name]. 01/04/2021 2:30 PM Medical Record Number: [id number] Patient Account Number: [id number] Date of Birth/Sex: Treating RN: [birth year] (87 y.o. Ernestene Mention Primary Care Junko Ohagan: [name] Other Clinician: Referring Leverett Camplin: Treating Aiman Sonn/Extender: Donald Pore , [name]: 12 Vital Signs Time Taken: 15:13 Temperature (F): 98.3 Height (in): 69 Pulse (bpm): 73 Source: Stated Respiratory Rate (breaths/min): 18 Weight (lbs): 222 Blood Pressure (mmHg): 150/70 Source: Stated Reference Range: 80 - 120 mg / dl Body Mass Index (BMI): 32.8 Electronic Signature(s) Signed: 01/04/2021 5:21:08 PM By: Baruch Gouty RN, BSN Entered By: Baruch Gouty on 01/04/2021 15:13:44

## 2021-01-18 ENCOUNTER — Encounter (HOSPITAL_BASED_OUTPATIENT_CLINIC_OR_DEPARTMENT_OTHER): Payer: Medicare PPO | Attending: Internal Medicine | Admitting: Internal Medicine

## 2021-01-18 ENCOUNTER — Other Ambulatory Visit: Payer: Self-pay

## 2021-01-18 DIAGNOSIS — I10 Essential (primary) hypertension: Secondary | ICD-10-CM | POA: Insufficient documentation

## 2021-01-18 DIAGNOSIS — G4730 Sleep apnea, unspecified: Secondary | ICD-10-CM | POA: Insufficient documentation

## 2021-01-18 DIAGNOSIS — X58XXXD Exposure to other specified factors, subsequent encounter: Secondary | ICD-10-CM | POA: Insufficient documentation

## 2021-01-18 DIAGNOSIS — R600 Localized edema: Secondary | ICD-10-CM | POA: Insufficient documentation

## 2021-01-18 DIAGNOSIS — Z993 Dependence on wheelchair: Secondary | ICD-10-CM | POA: Insufficient documentation

## 2021-01-18 DIAGNOSIS — N400 Enlarged prostate without lower urinary tract symptoms: Secondary | ICD-10-CM | POA: Diagnosis not present

## 2021-01-18 DIAGNOSIS — L98492 Non-pressure chronic ulcer of skin of other sites with fat layer exposed: Secondary | ICD-10-CM | POA: Diagnosis not present

## 2021-01-18 DIAGNOSIS — Z86718 Personal history of other venous thrombosis and embolism: Secondary | ICD-10-CM | POA: Insufficient documentation

## 2021-01-18 DIAGNOSIS — S31103D Unspecified open wound of abdominal wall, right lower quadrant without penetration into peritoneal cavity, subsequent encounter: Secondary | ICD-10-CM | POA: Diagnosis not present

## 2021-01-18 DIAGNOSIS — G609 Hereditary and idiopathic neuropathy, unspecified: Secondary | ICD-10-CM | POA: Diagnosis not present

## 2021-01-18 NOTE — Progress Notes (Signed)
JASHER, BARKAN ([id number]) Visit Report for 01/18/2021 HPI Details Patient Name: Date of Service: Travis, Adkins 01/18/2021 12:45 PM Medical Record Number: [id number] Patient Account Number: [id number] Date of Birth/Sex: Treating RN: 01-24-1931 (87 y.o. [name] Primary Care Provider: [name] Other Clinician: Referring Provider: Treating Provider/Extender: [name] , [name]: 14 History of Present Illness HPI Description: ADMISSION 10/09/2020 This is a pleasant 67 year old man from assisted living at friends from [place] accompanied by his wife. They are here for a many month progression of the wounds in the right lower abdominal pannus fold. This initially was noted apparently in June 2021. There is some suggestion that he may have scratched this area and opened the wound but the patient does not remember doing this. They were using silver alginate in this area for a while but more recently a hydrocolloid. The area is not painful. There is also suggestion that they are using nystatin for a while however they are not doing this currently. The patient is fairly adamant that he is not allowing his waistband of his clothing to rub on this area Past medical history; idiopathic peripheral neuropathy, osteoarthritis, BPH, falls, lumbar spinal stenosis, sleep apnea, small vessel DVT hypertension, lower , extremity edema. The patient walks for short distances with a walker the rest of the time he is in a wheelchair. They moved into the assisted living order a friend's home [place] about 9 months ago previously were at independent level 2/15; right lower abdominal pannus fold wound. This was a bit atypical last week but I elected to treat this is the usual friction type wound in the pannus fold using silver alginate offloading the area etc. He had one larger area and a smaller area both of these seem better and surface area has improved 3/1; right  lower abdominal pannus fold. He presented with a bit of an atypical wound raised and hyper granular. However this is gradually improved in terms of condition of the wound surface as well as surface area with standard treatment for a pannus/friction area wound. We have been using silver alginate they are changing this at friend's home [place] 3/25; little over 3 weeks since we've seen this man. He has been a typical wound in the right lateral abdomen in the middle of pannus folds however the pannus folds themselves were not that impressive. I've been managing this conservatively and the wound volume certainly has come down. We've been using Alginate 4/8; right lateral abdomen in the middle of the pannus folds however the pannus folds themselves are not that impressive. Somewhat friable surface we have been using silver alginate and this is gradually improving. I have always found the wound surface to be a bit atypical nevertheless as this is been gradually getting smaller I have spared the patient a biopsy 4/29; patient presents for 3-week follow-up. He has been using Hydrofera Blue to the abdominal wound and has noticed improvement. He has developed a rash along his groin. He says it started yesterday. They have tried antifungal cream on it. 5/13; this is a patient I have been treating for a open wound in the middle of the right lateral abdominal pannus fold. Although he has pannus folds there is certainly not as impressive is some we see and nor did he have any obvious major tinea or bacterial infection in the surrounding skin. He says that the area closed over about a week ago only that he noticed another area more anteriorly  in the last 2 days. No obvious trauma. This is not at a level where his waistband usually is he is just not sure how this happened Electronic Signature(s) Signed: 01/18/2021 5:00:10 PM By: Linton Ham MD Entered By: Linton Ham on 01/18/2021  14:14:50 -------------------------------------------------------------------------------- Physical Exam Details Patient Name: Date of Service: [name] 01/18/2021 12:45 PM Medical Record Number: [id number] Patient Account Number: [id number] Date of Birth/Sex: Treating RN: 12-11-1930 (87 y.o. [name] Primary Care Provider: [name] Other Clinician: Referring Provider: Treating Provider/Extender: [name] , [name]: 14 Constitutional Sitting or standing Blood Pressure is within target range for patient.. Pulse regular and within target range for patient.Marland Kitchen Respirations regular, non-labored and within target range.. Temperature is normal and within the target range for the patient.Marland Kitchen Appears in no distress. Notes Wound exam; the right-sided abdominal wound is completely healed and epithelization is normal hip. However in more in the midline or just to the left of the midline is another open area. This is not as large as the previous one again he has no idea how he did this. Electronic Signature(s) Signed: 01/18/2021 5:00:10 PM By: Linton Ham MD Entered By: Linton Ham on 01/18/2021 14:16:29 -------------------------------------------------------------------------------- Physician Orders Details Patient Name: Date of Service: [name] 01/18/2021 12:45 PM Medical Record Number: [id number] Patient Account Number: [id number] Date of Birth/Sex: Treating RN: [birth year] (87 y.o. [name] Primary Care Provider: [name] Other Clinician: Referring Provider: Treating Provider/Extender: [name] , [name]: 14 Verbal / Phone Orders: No Diagnosis Coding ICD-10 Coding Code Description S31.103D Unspecified open wound of abdominal wall, right lower quadrant without penetration into peritoneal cavity, subsequent encounter B37.9 Candidiasis, unspecified Follow-up  Appointments ppointment in 2 weeks. - with Dr. Dellia Nims Return A Bathing/ Shower/ Hygiene May shower with protection but do not get wound dressing(s) wet. - May use soap and water in the shower Off-Loading Turn and reposition every 2 hours Wound Treatment Wound #3 - Abdomen - midline Wound Laterality: Inferior Cleanser: Soap and Water Every Other Day/15 Days Discharge Instructions: May shower and wash wound with dial antibacterial soap and water prior to dressing change. Prim Dressing: Promogran Prisma Matrix, 4.34 (sq in) (silver collagen) Every Other Day/15 Days ary Discharge Instructions: Moisten collagen with saline or hydrogel Secondary Dressing: Bordered Gauze, 2x2 in Every Other Day/15 Days Discharge Instructions: Apply over primary dressing as directed. Electronic Signature(s) Signed: 01/18/2021 5:00:10 PM By: Linton Ham MD Signed: 01/18/2021 5:32:23 PM By: Lorrin Jackson Entered By: Lorrin Jackson on 01/18/2021 13:57:36 -------------------------------------------------------------------------------- Problem List Details Patient Name: Date of Service: [name] 01/18/2021 12:45 PM Medical Record Number: [id number] Patient Account Number: [id number] Date of Birth/Sex: Treating RN: 1931-07-22 (87 y.o. [name] Primary Care Provider: [name] Other Clinician: Referring Provider: Treating Provider/Extender: [name] , [name]: 14 Active Problems ICD-10 Encounter Code Description Active Date MDM Diagnosis S31.103D Unspecified open wound of abdominal wall, right lower quadrant without 10/09/2020 No Yes penetration into peritoneal cavity, subsequent encounter Inactive Problems ICD-10 Code Description Active Date Inactive Date B37.9 Candidiasis, unspecified 01/04/2021 01/04/2021 Resolved Problems Electronic Signature(s) Signed: 01/18/2021 5:00:10 PM By: Linton Ham MD Entered By: Linton Ham on 01/18/2021  14:13:06 -------------------------------------------------------------------------------- Progress Note Details Patient Name: Date of Service: [name] 01/18/2021 12:45 PM Medical Record Number: [id number] Patient Account Number: [id number] Date of Birth/Sex: Treating RN: 1930-10-29 (87 y.o. [name] Primary Care Provider:  [place] , Mast Other Clinician: Referring Provider: Treating Provider/Extender: [name] , [name]: 14 Subjective History of Present Illness (HPI) ADMISSION 10/09/2020 This is a pleasant 20 year old man from assisted living at friends from [place] accompanied by his wife. They are here for a many month progression of the wounds in the right lower abdominal pannus fold. This initially was noted apparently in June 2021. There is some suggestion that he may have scratched this area and opened the wound but the patient does not remember doing this. They were using silver alginate in this area for a while but more recently a hydrocolloid. The area is not painful. There is also suggestion that they are using nystatin for a while however they are not doing this currently. The patient is fairly adamant that he is not allowing his waistband of his clothing to rub on this area Past medical history; idiopathic peripheral neuropathy, osteoarthritis, BPH, falls, lumbar spinal stenosis, sleep apnea, small vessel DVT hypertension, lower , extremity edema. The patient walks for short distances with a walker the rest of the time he is in a wheelchair. They moved into the assisted living order a friend's home [place] about 9 months ago previously were at independent level 2/15; right lower abdominal pannus fold wound. This was a bit atypical last week but I elected to treat this is the usual friction type wound in the pannus fold using silver alginate offloading the area etc. He had one larger area and a smaller area both of these seem better  and surface area has improved 3/1; right lower abdominal pannus fold. He presented with a bit of an atypical wound raised and hyper granular. However this is gradually improved in terms of condition of the wound surface as well as surface area with standard treatment for a pannus/friction area wound. We have been using silver alginate they are changing this at friend's home [place] 3/25; little over 3 weeks since we've seen this man. He has been a typical wound in the right lateral abdomen in the middle of pannus folds however the pannus folds themselves were not that impressive. I've been managing this conservatively and the wound volume certainly has come down. We've been using Alginate 4/8; right lateral abdomen in the middle of the pannus folds however the pannus folds themselves are not that impressive. Somewhat friable surface we have been using silver alginate and this is gradually improving. I have always found the wound surface to be a bit atypical nevertheless as this is been gradually getting smaller I have spared the patient a biopsy 4/29; patient presents for 3-week follow-up. He has been using Hydrofera Blue to the abdominal wound and has noticed improvement. He has developed a rash along his groin. He says it started yesterday. They have tried antifungal cream on it. 5/13; this is a patient I have been treating for a open wound in the middle of the right lateral abdominal pannus fold. Although he has pannus folds there is certainly not as impressive is some we see and nor did he have any obvious major tinea or bacterial infection in the surrounding skin. He says that the area closed over about a week ago only that he noticed another area more anteriorly in the last 2 days. No obvious trauma. This is not at a level where his waistband usually is he is just not sure how this happened Objective Constitutional Sitting or standing Blood Pressure is within target range for patient.. Pulse  regular and  within target range for patient.Marland Kitchen Respirations regular, non-labored and within target range.. Temperature is normal and within the target range for the patient.Marland Kitchen Appears in no distress. Vitals Time Taken: 1:11 PM, Height: 69 in, Weight: 222 lbs, BMI: 32.8, Temperature: 98.0 F, Pulse: 75 bpm, Respiratory Rate: 16 breaths/min, Blood Pressure: 118/71 mmHg. General Notes: Wound exam; the right-sided abdominal wound is completely healed and epithelization is normal hip. However in more in the midline or just to the left of the midline is another open area. This is not as large as the previous one again he has no idea how he did this. Integumentary (Hair, Skin) Wound #1 status is Healed - Epithelialized. Original cause of wound was Gradually Appeared. The date acquired was: 02/07/2020. The wound has been in treatment 14 weeks. The wound is located on the Right,Proximal,Lateral Abdomen - Lower Quadrant. The wound measures 0cm length x 0cm width x 0cm depth; 0cm^2 area and 0cm^3 volume. There is no tunneling or undermining noted. There is a none present amount of drainage noted. The wound margin is flat and intact. There is no granulation within the wound bed. There is no necrotic tissue within the wound bed. Wound #3 status is Open. Original cause of wound was Gradually Appeared. The date acquired was: 01/16/2021. The wound is located on the Inferior Abdomen - midline. The wound measures 1.5cm length x 2.8cm width x 0.1cm depth; 3.299cm^2 area and 0.33cm^3 volume. There is Fat Layer (Subcutaneous Tissue) exposed. There is no tunneling or undermining noted. There is a small amount of serosanguineous drainage noted. The wound margin is flat and intact. There is large (67-100%) red, pink granulation within the wound bed. There is no necrotic tissue within the wound bed. Assessment Active Problems ICD-10 Unspecified open wound of abdominal wall, right lower quadrant without penetration into  peritoneal cavity, subsequent encounter Plan Follow-up Appointments: Return Appointment in 2 weeks. - with Dr. Arcola Jansky Shower/ Hygiene: May shower with protection but do not get wound dressing(s) wet. - May use soap and water in the shower Off-Loading: Turn and reposition every 2 hours WOUND #3: - Abdomen - midline Wound Laterality: Inferior Cleanser: Soap and Water Every Other Day/15 Days Discharge Instructions: May shower and wash wound with dial antibacterial soap and water prior to dressing change. Prim Dressing: Promogran Prisma Matrix, 4.34 (sq in) (silver collagen) Every Other Day/15 Days ary Discharge Instructions: Moisten collagen with saline or hydrogel Secondary Dressing: Bordered Gauze, 2x2 in Every Other Day/15 Days Discharge Instructions: Apply over primary dressing as directed. 1. We are going to use polymen covered by border foam. I shaved hair around this wound area so we can have better effusion with the dressings. 2. In looking at the skin there was absolutely no evidence of bacterial or fungal infection. 3. As I have stated previously these pannus folds that he has are there but certainly not that impressive compared to some we see. Electronic Signature(s) Signed: 01/18/2021 5:00:10 PM By: Linton Ham MD Entered By: Linton Ham on 01/18/2021 14:17:22 -------------------------------------------------------------------------------- SuperBill Details Patient Name: Date of Service: [name] 01/18/2021 Medical Record Number: [id number] Patient Account Number: [id number] Date of Birth/Sex: Treating RN: 1931-01-07 (87 y.o. [name] Primary Care Provider: [name] Other Clinician: Referring Provider: Treating Provider/Extender: [name] , [name]: 14 Diagnosis Coding ICD-10 Codes Code Description S31.103D Unspecified open wound of abdominal wall, right lower quadrant without penetration into  peritoneal cavity, subsequent encounter B37.9 Candidiasis, unspecified Facility Procedures  CPT4 Code: [id number] Description: [facility] VISIT-LEV 3 EST PT Modifier: Quantity: 1 Physician Procedures : CPT4 Code Description Modifier [id number] - WC PHYS LEVEL 3 - EST PT ICD-10 Diagnosis Description S31.103D Unspecified open wound of abdominal wall, right lower quadrant without penetration into peritoneal cavi subsequent encounter Quantity: 1 ty, Electronic Signature(s) Signed: 01/18/2021 5:00:10 PM By: Linton Ham MD Entered By: Linton Ham on 01/18/2021 14:17:40

## 2021-01-23 NOTE — Progress Notes (Signed)
[name], [name] ([id number]) Visit Report for 01/18/2021 Arrival Information Details Patient Name: Date of Service: [name], [name] 01/18/2021 12:45 PM Medical Record Number: [id number] Patient Account Number: [id number] Date of Birth/Sex: Treating RN: 02/27/1931 (87 y.o. [name] Primary Care [name]: [name] Other Clinician: Referring [name]: Treating [name]/Extender: [name] , [name]: 14 Visit Information History Since Last Visit Added or deleted any medications: No Patient Arrived: Wheel Chair Any new allergies or adverse reactions: No Arrival Time: 13:09 Had a fall or experienced change in No Accompanied By: wife activities of daily living that may affect Transfer Assistance: None risk of falls: Patient Identification Verified: Yes Signs or symptoms of abuse/neglect since last visito No Secondary Verification Process Completed: Yes Hospitalized since last visit: No Patient Requires Transmission-Based Precautions: No Implantable device outside of the clinic excluding No Patient Has Alerts: No cellular tissue based products placed in the center since last visit: Has Dressing in Place as Prescribed: Yes Pain Present Now: No Electronic Signature(s) Signed: 01/23/2021 6:30:22 PM By: Levan Hurst RN, BSN Entered By: Levan Hurst on 01/18/2021 13:11:35 -------------------------------------------------------------------------------- Clinic Level of Care Assessment Details Patient Name: Date of Service: [name] 01/18/2021 12:45 PM Medical Record Number: [id number] Patient Account Number: [id number] Date of Birth/Sex: Treating RN: 12-12-1930 (87 y.o. [name] Primary Care Sarena Jezek: [name] Other Clinician: Referring [name]: Treating [name]/Extender: [name] , [name]: 14 Clinic Level of Care Assessment Items TOOL 4 Quantity Score X- 1 0 Use when only an  EandM is performed on FOLLOW-UP visit ASSESSMENTS - Nursing Assessment / Reassessment X- 1 10 Reassessment of Co-morbidities (includes updates in patient status) X- 1 5 Reassessment of Adherence to Adkins Plan ASSESSMENTS - Wound and Skin A ssessment / Reassessment X - Simple Wound Assessment / Reassessment - one wound 1 5 _0  - 0 Complex Wound Assessment / Reassessment - multiple wounds _1  - 0 Dermatologic / Skin Assessment (not related to wound area) ASSESSMENTS - Focused Assessment _2  - 0 Circumferential Edema Measurements - multi extremities _3  - 0 Nutritional Assessment / Counseling / Intervention _4  - 0 Lower Extremity Assessment (monofilament, tuning fork, pulses) _5  - 0 Peripheral Arterial Disease Assessment (using hand held doppler) ASSESSMENTS - Ostomy and/or Continence Assessment and Care _6  - 0 Incontinence Assessment and Management _7  - 0 Ostomy Care Assessment and Management (repouching, etc.) PROCESS - Coordination of Care _8  - 0 Simple Patient / Family Education for ongoing care X- 1 20 Complex (extensive) Patient / Family Education for ongoing care _9  - 0 Staff obtains Programmer, systems, Records, T Results / Process Orders est _10  - 0 Staff telephones HHA, Nursing Homes / Clarify orders / etc _11  - 0 Routine Transfer to another Facility (non-emergent condition) _12  - 0 Routine Hospital Admission (non-emergent condition) _13  - 0 New Admissions / Biomedical engineer / Ordering NPWT Apligraf, etc. , _14  - 0 Emergency Hospital Admission (emergent condition) _15  - 0 Simple Discharge Coordination _16  - 0 Complex (extensive) Discharge Coordination PROCESS - Special Needs _17  - 0 Pediatric / Minor Patient Management _18  - 0 Isolation Patient Management _19  - 0 Hearing / Language / Visual special needs _20  - 0 Assessment of Community assistance (transportation, D/C planning, etc.) _21  - 0 Additional assistance / Altered mentation _22  - 0 Support Surface(s)  Assessment (bed, cushion, seat, etc.) INTERVENTIONS - Wound Cleansing / Measurement X - Simple Wound Cleansing - one wound 1 5 _23  - 0 Complex Wound  Cleansing - multiple wounds X- 1 5 Wound Imaging (photographs - any number of wounds) _0  - 0 Wound Tracing (instead of photographs) X- 1 5 Simple Wound Measurement - one wound _1  - 0 Complex Wound Measurement - multiple wounds INTERVENTIONS - Wound Dressings _2  - 0 Small Wound Dressing one or multiple wounds X- 1 15 Medium Wound Dressing one or multiple wounds _3  - 0 Large Wound Dressing one or multiple wounds X- 1 5 Application of Medications - topical <UXNATFTDDUKGURKY>_7<\/CWCBJSEGBTDVVOHY>_0  - 0 Application of Medications - injection INTERVENTIONS - Miscellaneous _5  - 0 External ear exam _6  - 0 Specimen Collection (cultures, biopsies, blood, body fluids, etc.) _7  - 0 Specimen(s) / Culture(s) sent or taken to Lab for analysis _8  - 0 Patient Transfer (multiple staff / Civil Service fast streamer / Similar devices) _9  - 0 Simple Staple / Suture removal (25 or less) _10  - 0 Complex Staple / Suture removal (26 or more) _11  - 0 Hypo / Hyperglycemic Management (close monitor of Blood Glucose) _12  - 0 Ankle / Brachial Index (ABI) - do not check if billed separately X- 1 5 Vital Signs Has the patient been seen at the hospital within the last three years: Yes Total Score: 80 Level Of Care: New/Established - Level 3 Electronic Signature(s) Signed: 01/18/2021 5:32:23 PM By: Lorrin Jackson Entered By: Lorrin Jackson on 01/18/2021 14:01:11 -------------------------------------------------------------------------------- Encounter Discharge Information Details Patient Name: Date of Service: [name]. 01/18/2021 12:45 PM Medical Record Number: [id number] Patient Account Number: [id number] Date of Birth/Sex: Treating RN: 12-31-1930 (87 y.o. [name] Primary Care Sheyanne Munley: [name] Other Clinician: Referring Granger Chui: Treating Macaela Presas/Extender: [name] , [name]: 14 Encounter Discharge Information Items Discharge Condition: Stable Ambulatory Status: Wheelchair Discharge Destination: Skilled Nursing Facility Orders Sent: Yes Transportation: Private Auto Accompanied By: wife Schedule Follow-up Appointment: Yes Clinical Summary of Care: Provided Form Type Recipient Paper Patient Patient Electronic Signature(s) Signed: 01/18/2021 2:15:07 PM By: Lorrin Jackson Entered By: Lorrin Jackson on 01/18/2021 14:15:07 -------------------------------------------------------------------------------- Lower Extremity Assessment Details Patient Name: Date of Service: [name], [name] 01/18/2021 12:45 PM Medical Record Number: [id number] Patient Account Number: [id number] Date of Birth/Sex: Treating RN: May 17, 1931 (87 y.o. [name] Primary Care Aixa Corsello: [name] Other Clinician: Referring Willamae Demby: Treating Delonda Coley/Extender: [name] , [name]: 14 Electronic Signature(s) Signed: 01/18/2021 5:32:23 PM By: Lorrin Jackson Entered By: Lorrin Jackson on 01/18/2021 13:50:37 -------------------------------------------------------------------------------- Multi Wound Chart Details Patient Name: Date of Service: [name] 01/18/2021 12:45 PM Medical Record Number: [id number] Patient Account Number: [id number] Date of Birth/Sex: Treating RN: March 27, 1931 (87 y.o. [name] Primary Care Milt Coye: [name] Other Clinician: Referring Corinna Burkman: Treating Raeven Pint/Extender: [name] , [name]: 14 Vital Signs Height(in): 69 Pulse(bpm): 75 Weight(lbs): 222 Blood Pressure(mmHg): 118/71 Body Mass Index(BMI): 33 Temperature(F): 98.0 Respiratory Rate(breaths/min): 16 Photos: [1:No Photos Right, Proximal, Lateral Abdomen -] [3:No Photos Inferior Abdomen - midline] [N/A:N/A N/A] Wound Location: [1:Lower Quadrant Gradually  Appeared] [3:Gradually Appeared] [N/A:N/A] Wounding Event: [1:Abrasion] [3:MASD] [N/A:N/A] Primary Etiology: [1:Cataracts, Anemia, Sleep Apnea,] [3:Cataracts, Anemia, Sleep Apnea,] [N/A:N/A] Comorbid History: [1:Hypertension, Osteoarthritis, Neuropathy 02/07/2020] [3:Hypertension, Osteoarthritis, Neuropathy 01/16/2021] [N/A:N/A] Date Acquired: [1:14] [3:0] [N/A:N/A] Weeks of Adkins: [1:Healed - Epithelialized] [3:Open] [N/A:N/A] Wound Status: [1:0x0x0] [3:1.5x2.8x0.1] [N/A:N/A] Measurements L x W x D (cm) [1:0] [3:3.299] [N/A:N/A] A (cm) : rea [1:0] [3:0.33] [N/A:N/A] Volume (cm) : [1:100.00%] [3:N/A] [N/A:N/A] % Reduction in Area: [1:100.00%] [3:N/A] [N/A:N/A] % Reduction in Volume: [1:Full  Thickness Without Exposed] [3:Full Thickness Without Exposed] [N/A:N/A] Classification: [1:Support Structures None Present] [3:Support Structures Small] [N/A:N/A] Exudate Amount: [1:N/A] [3:Serosanguineous] [N/A:N/A] Exudate Type: [1:N/A] [3:red, brown] [N/A:N/A] Exudate Color: [1:Flat and Intact] [3:Flat and Intact] [N/A:N/A] Wound Margin: [1:None Present (0%)] [3:Large (67-100%)] [N/A:N/A] Granulation Amount: [1:N/A] [3:Red, Pink] [N/A:N/A] Granulation Quality: [1:None Present (0%)] [3:None Present (0%)] [N/A:N/A] Necrotic Amount: [1:Fascia: No] [3:Fat Layer (Subcutaneous Tissue): Yes N/A] Exposed Structures: [1:Fat Layer (Subcutaneous Tissue): No Tendon: No Muscle: No Joint: No Bone: No Large (67-100%)] [3:Fascia: No Tendon: No Muscle: No Joint: No Bone: No None] [N/A:N/A] Adkins Notes Electronic Signature(s) Signed: 01/18/2021 5:00:10 PM By: Linton Ham MD Signed: 01/18/2021 5:32:23 PM By: Lorrin Jackson Entered By: Linton Ham on 01/18/2021 14:13:14 -------------------------------------------------------------------------------- Multi-Disciplinary Care Plan Details Patient Name: Date of Service: [name]. 01/18/2021 12:45 PM Medical Record Number: [id number] Patient  Account Number: [id number] Date of Birth/Sex: Treating RN: 1931-05-21 (87 y.o. [name] Primary Care Rayshell Goecke: [name] Other Clinician: Referring Venice Liz: Treating Jaylene Arrowood/Extender: [name] , [name]: 14 Active Inactive Wound/Skin Impairment Nursing Diagnoses: Impaired tissue integrity Knowledge deficit related to ulceration/compromised skin integrity Goals: Patient/caregiver will verbalize understanding of skin care regimen Date Initiated: 10/09/2020 Date Inactivated: 01/18/2021 Target Resolution Date: 01/18/2021 Goal Status: Met Ulcer/skin breakdown will have a volume reduction of 30% by week 4 Date Initiated: 10/09/2020 Target Resolution Date: 02/15/2021 Goal Status: Active Interventions: Assess patient/caregiver ability to obtain necessary supplies Assess patient/caregiver ability to perform ulcer/skin care regimen upon admission and as needed Assess ulceration(s) every visit Notes: 12/14/20: Wound greater than 30% volume reduction, new goal initiated. 01/18/21: Original wound healed, new wound opened. Electronic Signature(s) Signed: 01/18/2021 5:32:23 PM By: Lorrin Jackson Entered By: Lorrin Jackson on 01/18/2021 13:54:06 -------------------------------------------------------------------------------- Pain Assessment Details Patient Name: Date of Service: [name], [name] 01/18/2021 12:45 PM Medical Record Number: [id number] Patient Account Number: [id number] Date of Birth/Sex: Treating RN: 28-Apr-1931 (87 y.o. [name] Primary Care Anhar Mcdermott: [name] Other Clinician: Referring Raney Koeppen: Treating Jermane Brayboy/Extender: [name] , [name]: 14 Active Problems Location of Pain Severity and Description of Pain Patient Has Paino No Site Locations Pain Management and Medication Current Pain Management: Electronic Signature(s) Signed: 01/23/2021 6:30:22 PM By: Levan Hurst RN,  BSN Signed: 01/23/2021 6:30:22 PM By: Levan Hurst RN, BSN Entered By: Levan Hurst on 01/18/2021 13:11:57 -------------------------------------------------------------------------------- Patient/Caregiver Education Details Patient Name: Date of Service: [name] 5/13/2022andnbsp12:45 PM Medical Record Number: [id number] Patient Account Number: [id number] Date of Birth/Gender: Treating RN: 1931/04/23 (87 y.o. [name] Primary Care Physician: [name] Other Clinician: Referring Physician: Treating Physician/Extender: [name] , [name]: 14 Education Assessment Education Provided To: Patient Education Topics Provided Wound/Skin Impairment: Methods: Explain/Verbal, Printed Responses: State content correctly Electronic Signature(s) Signed: 01/18/2021 5:32:23 PM By: Lorrin Jackson Entered By: Lorrin Jackson on 01/18/2021 13:54:24 -------------------------------------------------------------------------------- Wound Assessment Details Patient Name: Date of Service: [name] 01/18/2021 12:45 PM Medical Record Number: [id number] Patient Account Number: [id number] Date of Birth/Sex: Treating RN: [birth year] (87 y.o. [name] Primary Care Aubrina Nieman: [name] Other Clinician: Referring Mackena Plummer: Treating Vonnie Ligman/Extender: [name] , [name]: 14 Wound Status Wound Number: 1 Primary Abrasion Etiology: Wound Location: Right, Proximal, Lateral Abdomen - Lower Quadrant Wound Status: Healed - Epithelialized Wounding Event: Gradually Appeared Comorbid Cataracts, Anemia, Sleep Apnea, Hypertension, Osteoarthritis, Date Acquired: 02/07/2020 History: Neuropathy Weeks Of Adkins: 14 Clustered Wound: No Wound  Measurements Length: (cm) Width: (cm) Depth: (cm) Area: (cm) Volume: (cm) 0 % Reduction in Area: 100% 0 % Reduction in Volume: 100% 0 Epithelialization: Large  (67-100%) 0 Tunneling: No 0 Undermining: No Wound Description Classification: Full Thickness Without Exposed Support Structures Wound Margin: Flat and Intact Exudate Amount: None Present Foul Odor After Cleansing: No Slough/Fibrino No Wound Bed Granulation Amount: None Present (0%) Exposed Structure Necrotic Amount: None Present (0%) Fascia Exposed: No Fat Layer (Subcutaneous Tissue) Exposed: No Tendon Exposed: No Muscle Exposed: No Joint Exposed: No Bone Exposed: No Electronic Signature(s) Signed: 01/23/2021 6:30:22 PM By: Levan Hurst RN, BSN Entered By: Levan Hurst on 01/18/2021 13:18:29 -------------------------------------------------------------------------------- Wound Assessment Details Patient Name: Date of Service: [name] 01/18/2021 12:45 PM Medical Record Number: [id number] Patient Account Number: [id number] Date of Birth/Sex: Treating RN: Aug 21, 1931 (87 y.o. [name] Primary Care Tori Dattilio: [name] Other Clinician: Referring Nickie Warwick: Treating Vivianne Carles/Extender: [name] , [name]: 14 Wound Status Wound Number: 3 Primary MASD Etiology: Wound Location: Inferior Abdomen - midline Wound Status: Open Wounding Event: Gradually Appeared Comorbid Cataracts, Anemia, Sleep Apnea, Hypertension, Osteoarthritis, Date Acquired: 01/16/2021 History: Neuropathy Weeks Of Adkins: 0 Clustered Wound: No Photos Wound Measurements Length: (cm) 1.5 Width: (cm) 2.8 Depth: (cm) 0.1 Area: (cm) 3.299 Volume: (cm) 0.33 % Reduction in Area: 0% % Reduction in Volume: 0% Epithelialization: None Tunneling: No Undermining: No Wound Description Classification: Full Thickness Without Exposed Support Structures Wound Margin: Flat and Intact Exudate Amount: Small Exudate Type: Serosanguineous Exudate Color: red, brown Foul Odor After Cleansing: No Slough/Fibrino No Wound Bed Granulation Amount: Large (67-100%)  Exposed Structure Granulation Quality: Red, Pink Fascia Exposed: No Necrotic Amount: None Present (0%) Fat Layer (Subcutaneous Tissue) Exposed: Yes Tendon Exposed: No Muscle Exposed: No Joint Exposed: No Bone Exposed: No Adkins Notes Wound #3 (Abdomen - midline) Wound Laterality: Inferior Cleanser Soap and Water Discharge Instruction: May shower and wash wound with dial antibacterial soap and water prior to dressing change. Peri-Wound Care Topical Primary Dressing Promogran Prisma Matrix, 4.34 (sq in) (silver collagen) Discharge Instruction: Moisten collagen with saline or hydrogel Secondary Dressing Bordered Gauze, 2x2 in Discharge Instruction: Apply over primary dressing as directed. Secured With Compression Wrap Compression Stockings Environmental education officer) Signed: 01/18/2021 4:52:32 PM By: Sandre Kitty Signed: 01/23/2021 6:30:22 PM By: Levan Hurst RN, BSN Entered By: Sandre Kitty on 01/18/2021 16:26:19 -------------------------------------------------------------------------------- Vitals Details Patient Name: Date of Service: [name]. 01/18/2021 12:45 PM Medical Record Number: [id number] Patient Account Number: [id number] Date of Birth/Sex: Treating RN: 1931-02-17 (87 y.o. [name] Primary Care Lizann Edelman: [name] Other Clinician: Referring Zoii Florer: Treating Leshaun Biebel/Extender: [name] , [name]: 14 Vital Signs Time Taken: 13:11 Temperature (F): 98.0 Height (in): 69 Pulse (bpm): 75 Weight (lbs): 222 Respiratory Rate (breaths/min): 16 Body Mass Index (BMI): 32.8 Blood Pressure (mmHg): 118/71 Reference Range: 80 - 120 mg / dl Electronic Signature(s) Signed: 01/23/2021 6:30:22 PM By: Levan Hurst RN, BSN Entered By: Levan Hurst on 01/18/2021 13:11:52

## 2021-02-01 ENCOUNTER — Encounter (HOSPITAL_BASED_OUTPATIENT_CLINIC_OR_DEPARTMENT_OTHER): Payer: Medicare PPO | Admitting: Internal Medicine

## 2021-02-08 ENCOUNTER — Encounter (HOSPITAL_BASED_OUTPATIENT_CLINIC_OR_DEPARTMENT_OTHER): Payer: Medicare PPO | Attending: Internal Medicine | Admitting: Internal Medicine

## 2021-02-08 ENCOUNTER — Other Ambulatory Visit: Payer: Self-pay

## 2021-02-08 DIAGNOSIS — S31103A Unspecified open wound of abdominal wall, right lower quadrant without penetration into peritoneal cavity, initial encounter: Secondary | ICD-10-CM | POA: Diagnosis not present

## 2021-02-08 DIAGNOSIS — X58XXXA Exposure to other specified factors, initial encounter: Secondary | ICD-10-CM | POA: Diagnosis not present

## 2021-02-08 DIAGNOSIS — L98499 Non-pressure chronic ulcer of skin of other sites with unspecified severity: Secondary | ICD-10-CM | POA: Diagnosis not present

## 2021-02-13 NOTE — Progress Notes (Signed)
[name], [name] ([id number]) Visit Report for 02/08/2021 Arrival Information Details Patient Name: Date of Service: [name], [name] 02/08/2021 10:30 A M Medical Record Number: [id number] Patient Account Number: [id number] Date of Birth/Sex: Treating RN: 07/12/1931 (87 y.o. Janyth Contes Primary Care Icess Bertoni: [name] Other Clinician: Referring Norval Slaven: Treating Eleina Jergens/Extender: [name] , [name]: 39 Visit Information History Since Last Visit Added or deleted any medications: No Patient Arrived: Wheel Chair Any new allergies or adverse reactions: No Arrival Time: 11:02 Had a fall or experienced change in No Accompanied By: wife activities of daily living that may affect Transfer Assistance: None risk of falls: Patient Identification Verified: Yes Signs or symptoms of abuse/neglect since last visito No Secondary Verification Process Completed: Yes Hospitalized since last visit: No Patient Requires Transmission-Based Precautions: No Implantable device outside of the clinic excluding No Patient Has Alerts: No cellular tissue based products placed in the center since last visit: Has Dressing in Place as Prescribed: Yes Pain Present Now: No Electronic Signature(s) Signed: 02/13/2021 5:57:05 PM By: Levan Hurst RN, BSN Entered By: Levan Hurst on 02/08/2021 11:02:24 -------------------------------------------------------------------------------- Clinic Level of Care Assessment Details Patient Name: Date of Service: Travis Crouch. 02/08/2021 10:30 A M Medical Record Number: [id number] Patient Account Number: [id number] Date of Birth/Sex: Treating RN: 21-Jun-1931 (87 y.o. Janyth Contes Primary Care Icie Kuznicki: [name] Other Clinician: Referring Bronwyn Belasco: Treating Keawe Marcello/Extender: [name] , [name]: 17 Clinic Level of Care Assessment Items TOOL 4 Quantity Score X- 1 0 Use when only an  EandM is performed on FOLLOW-UP visit ASSESSMENTS - Nursing Assessment / Reassessment X- 1 10 Reassessment of Co-morbidities (includes updates in patient status) X- 1 5 Reassessment of Adherence to Treatment Plan ASSESSMENTS - Wound and Skin A ssessment / Reassessment X - Simple Wound Assessment / Reassessment - one wound 1 5 []  - 0 Complex Wound Assessment / Reassessment - multiple wounds []  - 0 Dermatologic / Skin Assessment (not related to wound area) ASSESSMENTS - Focused Assessment []  - 0 Circumferential Edema Measurements - multi extremities []  - 0 Nutritional Assessment / Counseling / Intervention []  - 0 Lower Extremity Assessment (monofilament, tuning fork, pulses) []  - 0 Peripheral Arterial Disease Assessment (using hand held doppler) ASSESSMENTS - Ostomy and/or Continence Assessment and Care []  - 0 Incontinence Assessment and Management []  - 0 Ostomy Care Assessment and Management (repouching, etc.) PROCESS - Coordination of Care X - Simple Patient / Family Education for ongoing care 1 15 []  - 0 Complex (extensive) Patient / Family Education for ongoing care X- 1 10 Staff obtains Programmer, systems, Records, T Results / Process Orders est []  - 0 Staff telephones HHA, Nursing Homes / Clarify orders / etc []  - 0 Routine Transfer to another Facility (non-emergent condition) []  - 0 Routine Hospital Admission (non-emergent condition) []  - 0 New Admissions / Biomedical engineer / Ordering NPWT Apligraf, etc. , []  - 0 Emergency Hospital Admission (emergent condition) X- 1 10 Simple Discharge Coordination []  - 0 Complex (extensive) Discharge Coordination PROCESS - Special Needs []  - 0 Pediatric / Minor Patient Management []  - 0 Isolation Patient Management []  - 0 Hearing / Language / Visual special needs []  - 0 Assessment of Community assistance (transportation, D/C planning, etc.) []  - 0 Additional assistance / Altered mentation []  - 0 Support Surface(s)  Assessment (bed, cushion, seat, etc.) INTERVENTIONS - Wound Cleansing / Measurement X - Simple Wound Cleansing - one wound 1 5 []  -  0 Complex Wound Cleansing - multiple wounds X- 1 5 Wound Imaging (photographs - any number of wounds) []  - 0 Wound Tracing (instead of photographs) X- 1 5 Simple Wound Measurement - one wound []  - 0 Complex Wound Measurement - multiple wounds INTERVENTIONS - Wound Dressings []  - 0 Small Wound Dressing one or multiple wounds []  - 0 Medium Wound Dressing one or multiple wounds []  - 0 Large Wound Dressing one or multiple wounds []  - 0 Application of Medications - topical []  - 0 Application of Medications - injection INTERVENTIONS - Miscellaneous []  - 0 External ear exam []  - 0 Specimen Collection (cultures, biopsies, blood, body fluids, etc.) []  - 0 Specimen(s) / Culture(s) sent or taken to Lab for analysis []  - 0 Patient Transfer (multiple staff / Civil Service fast streamer / Similar devices) []  - 0 Simple Staple / Suture removal (25 or less) []  - 0 Complex Staple / Suture removal (26 or more) []  - 0 Hypo / Hyperglycemic Management (close monitor of Blood Glucose) []  - 0 Ankle / Brachial Index (ABI) - do not check if billed separately X- 1 5 Vital Signs Has the patient been seen at the hospital within the last three years: Yes Total Score: 75 Level Of Care: New/Established - Level 2 Electronic Signature(s) Signed: 02/13/2021 5:57:05 PM By: Levan Hurst RN, BSN Entered By: Levan Hurst on 02/08/2021 11:39:48 -------------------------------------------------------------------------------- Encounter Discharge Information Details Patient Name: Date of Service: Travis Costain C. 02/08/2021 10:30 A M Medical Record Number: [id number] Patient Account Number: [id number] Date of Birth/Sex: Treating RN: [birth year] (87 y.o. Janyth Contes Primary Care Holley Wirt: [name] Other Clinician: Referring Darwyn Ponzo: Treating Jaclynn Laumann/Extender: [name] , [name]: 17 Encounter Discharge Information Items Discharge Condition: Stable Ambulatory Status: Wheelchair Discharge Destination: Home Transportation: Private Auto Accompanied By: wife Schedule Follow-up Appointment: Yes Clinical Summary of Care: Patient Declined Electronic Signature(s) Signed: 02/13/2021 5:57:05 PM By: Levan Hurst RN, BSN Entered By: Levan Hurst on 02/08/2021 13:27:05 -------------------------------------------------------------------------------- Lower Extremity Assessment Details Patient Name: Date of Service: Travis Crouch. 02/08/2021 10:30 A M Medical Record Number: [id number] Patient Account Number: [id number] Date of Birth/Sex: Treating RN: [birth year] (87 y.o. [name] Primary Care Inice Sanluis: [name] Other Clinician: Referring Nalin Mazzocco: Treating Andersen Mckiver/Extender: [name] , [name]: 17 Electronic Signature(s) Signed: 02/08/2021 5:39:03 PM By: Lorrin Jackson Entered By: Lorrin Jackson on 02/08/2021 17:39:02 -------------------------------------------------------------------------------- Multi-Disciplinary Care Plan Details Patient Name: Date of Service: Travis Costain C. 02/08/2021 10:30 A M Medical Record Number: [id number] Patient Account Number: [id number] Date of Birth/Sex: Treating RN: Mar 11, 1931 (87 y.o. Janyth Contes Primary Care Mikaya Bunner: [name] Other Clinician: Referring Rayme Bui: Treating Branae Crail/Extender: [name] , [name]: 17 Active Inactive Electronic Signature(s) Signed: 02/13/2021 5:57:05 PM By: Levan Hurst RN, BSN Entered By: Levan Hurst on 02/08/2021 11:37:22 -------------------------------------------------------------------------------- Pain Assessment Details Patient Name: Date of Service: Travis Crouch. 02/08/2021 10:30 A M Medical Record Number: [id number] Patient Account Number:  [id number] Date of Birth/Sex: Treating RN: Aug 04, 1931 (87 y.o. Janyth Contes Primary Care Arabela Basaldua: [name] Other Clinician: Referring Audreanna Torrisi: Treating Derelle Cockrell/Extender: [name] , [name]: 17 Active Problems Location of Pain Severity and Description of Pain Patient Has Paino No Site Locations Pain Management and Medication Current Pain Management: Electronic Signature(s) Signed: 02/13/2021 5:57:05 PM By: Levan Hurst RN, BSN Entered By: Levan Hurst on 02/08/2021 11:02:46 -------------------------------------------------------------------------------- Patient/Caregiver Education Details Patient Name: Date  of Service: [name], [name] 6/3/2022andnbsp10:30 A M Medical Record Number: [id number] Patient Account Number: [id number] Date of Birth/Gender: Treating RN: 1931-06-19 (87 y.o. Janyth Contes Primary Care Physician: [name] Other Clinician: Referring Physician: Treating Physician/Extender: [name] , [name]: 17 Education Assessment Education Provided To: Patient Education Topics Provided Wound/Skin Impairment: Methods: Explain/Verbal Responses: State content correctly Electronic Signature(s) Signed: 02/13/2021 5:57:05 PM By: Levan Hurst RN, BSN Entered By: Levan Hurst on 02/08/2021 11:37:39 -------------------------------------------------------------------------------- Wound Assessment Details Patient Name: Date of Service: Travis Crouch. 02/08/2021 10:30 A M Medical Record Number: [id number] Patient Account Number: [id number] Date of Birth/Sex: Treating RN: 20-Jul-1931 (87 y.o. Janyth Contes Primary Care Gunda Maqueda: [name] Other Clinician: Referring Haylei Cobin: Treating Trenell Moxey/Extender: [name] , [name]: 17 Wound Status Wound Number: 3 Primary MASD Etiology: Wound Location: Inferior Abdomen - midline Wound Status:  Healed - Epithelialized Wounding Event: Gradually Appeared Comorbid Cataracts, Anemia, Sleep Apnea, Hypertension, Date Acquired: 01/16/2021 History: Osteoarthritis, Neuropathy Weeks Of Treatment: 3 Clustered Wound: No Photos Wound Measurements Length: (cm) Width: (cm) Depth: (cm) Area: (cm) Volume: (cm) 0 % Reduction in Area: 100% 0 % Reduction in Volume: 100% 0 Epithelialization: Large (67-100%) 0 Tunneling: No 0 Undermining: No Wound Description Classification: Full Thickness Without Exposed Support Structures Wound Margin: Flat and Intact Exudate Amount: None Present Foul Odor After Cleansing: No Slough/Fibrino No Wound Bed Granulation Amount: None Present (0%) Exposed Structure Necrotic Amount: None Present (0%) Fascia Exposed: No Fat Layer (Subcutaneous Tissue) Exposed: No Tendon Exposed: No Muscle Exposed: No Joint Exposed: No Bone Exposed: No Electronic Signature(s) Signed: 02/08/2021 5:48:41 PM By: Sandre Kitty Signed: 02/13/2021 5:57:05 PM By: Levan Hurst RN, BSN Entered By: Sandre Kitty on 02/08/2021 17:13:06 -------------------------------------------------------------------------------- [place] Details Patient Name: Date of Service: Travis Costain C. 02/08/2021 10:30 A M Medical Record Number: [id number] Patient Account Number: [id number] Date of Birth/Sex: Treating RN: 10/16/1930 (87 y.o. Janyth Contes Primary Care Shelly Spenser: [name] Other Clinician: Referring Brynlee Pennywell: Treating Makailee Nudelman/Extender: [name] , [name]: 17 Vital Signs Time Taken: 11:02 Temperature (F): 97.9 Height (in): 69 Pulse (bpm): 66 Weight (lbs): 222 Respiratory Rate (breaths/min): 18 Body Mass Index (BMI): 32.8 Blood Pressure (mmHg): 125/76 Reference Range: 80 - 120 mg / dl Electronic Signature(s) Signed: 02/13/2021 5:57:05 PM By: Levan Hurst RN, BSN Entered By: Levan Hurst on 02/08/2021 11:02:41

## 2021-02-13 NOTE — Progress Notes (Signed)
ANTIONE, OBAR ([id number]) Visit Report for 02/08/2021 HPI Details Patient Name: Date of Service: DIXON, LUCZAK 02/08/2021 10:30 A M Medical Record Number: [id number] Patient Account Number: [id number] Date of Birth/Sex: Treating RN: 1931/04/27 (87 y.o. [name] Primary Care Provider: [name] Other Clinician: Referring Provider: Treating Provider/Extender: [name] , [name]: 17 History of Present Illness HPI Description: ADMISSION 10/09/2020 This is a pleasant 25 year old man from assisted living at [facility] from [place] accompanied by his wife. They are here for a many month progression of the wounds in the right lower abdominal pannus fold. This initially was noted apparently in June 2021. There is some suggestion that he may have scratched this area and opened the wound but the patient does not remember doing this. They were using silver alginate in this area for a while but more recently a hydrocolloid. The area is not painful. There is also suggestion that they are using nystatin for a while however they are not doing this currently. The patient is fairly adamant that he is not allowing his waistband of his clothing to rub on this area Past medical history; idiopathic peripheral neuropathy, osteoarthritis, BPH, falls, lumbar spinal stenosis, sleep apnea, small vessel DVT hypertension, lower , extremity edema. The patient walks for short distances with a walker the rest of the time he is in a wheelchair. They moved into the assisted living order a [facility] [place] about 9 months ago previously were at independent level 2/15; right lower abdominal pannus fold wound. This was a bit atypical last week but I elected to treat this is the usual friction type wound in the pannus fold using silver alginate offloading the area etc. He had one larger area and a smaller area both of these seem better and surface area has improved 3/1; right  lower abdominal pannus fold. He presented with a bit of an atypical wound raised and hyper granular. However this is gradually improved in terms of condition of the wound surface as well as surface area with standard treatment for a pannus/friction area wound. We have been using silver alginate they are changing this at [facility] [place] 3/25; little over 3 weeks since we've seen this man. He has been a typical wound in the right lateral abdomen in the middle of pannus folds however the pannus folds themselves were not that impressive. I've been managing this conservatively and the wound volume certainly has come down. We've been using Alginate 4/8; right lateral abdomen in the middle of the pannus folds however the pannus folds themselves are not that impressive. Somewhat friable surface we have been using silver alginate and this is gradually improving. I have always found the wound surface to be a bit atypical nevertheless as this is been gradually getting smaller I have spared the patient a biopsy 4/29; patient presents for 3-week follow-up. He has been using Hydrofera Blue to the abdominal wound and has noticed improvement. He has developed a rash along his groin. He says it started yesterday. They have tried antifungal cream on it. 5/13; this is a patient I have been treating for a open wound in the middle of the right lateral abdominal pannus fold. Although he has pannus folds there is certainly not as impressive is some we see and nor did he have any obvious major tinea or bacterial infection in the surrounding skin. He says that the area closed over about a week ago only that he noticed another area more  anteriorly in the last 2 days. No obvious trauma. This is not at a level where his waistband usually is he is just not sure how this happened 6/3; all of the patient's pannus fold wounds are closed including the new ones we identified last time. Electronic Signature(s) Signed: 02/09/2021  7:13:41 AM By: Linton Ham MD Entered By: Linton Ham on 02/08/2021 12:08:26 -------------------------------------------------------------------------------- Physical Exam Details Patient Name: Date of Service: [name] 02/08/2021 10:30 A M Medical Record Number: [id number] Patient Account Number: [id number] Date of Birth/Sex: Treating RN: 20-Jul-1931 (87 y.o. [name] Primary Care Provider: [name] Other Clinician: Referring Provider: Treating Provider/Extender: [name] , [name]: 17 Constitutional Sitting or standing Blood Pressure is within target range for patient.. Pulse regular and within target range for patient.Marland Kitchen Respirations regular, non-labored and within target range.. Temperature is normal and within the target range for the patient.Marland Kitchen Appears in no distress. Notes Wound exam; all of the patient's pannus fold wounds are closed. His skin looks good Electronic Signature(s) Signed: 02/09/2021 7:13:41 AM By: Linton Ham MD Entered By: Linton Ham on 02/08/2021 12:09:29 -------------------------------------------------------------------------------- Physician Orders Details Patient Name: Date of Service: Davene Costain C. 02/08/2021 10:30 A M Medical Record Number: [id number] Patient Account Number: [id number] Date of Birth/Sex: Treating RN: September 18, 1930 (87 y.o. Janyth Contes Primary Care Provider: [name] Other Clinician: Referring Provider: Treating Provider/Extender: [name] , [name]: 22 Verbal / Phone Orders: No Diagnosis Coding ICD-10 Coding Code Description S31.103D Unspecified open wound of abdominal wall, right lower quadrant without penetration into peritoneal cavity, subsequent encounter Discharge From [facility] Discharge from [facility] healed!! Continue to keep skin folds clean and dry. Electronic Signature(s) Signed: 02/09/2021 7:13:41  AM By: Linton Ham MD Signed: 02/13/2021 5:57:05 PM By: Levan Hurst RN, BSN Entered By: Levan Hurst on 02/08/2021 11:38:49 -------------------------------------------------------------------------------- Problem List Details Patient Name: Date of Service: Davene Costain C. 02/08/2021 10:30 A M Medical Record Number: [id number] Patient Account Number: [id number] Date of Birth/Sex: Treating RN: 29-Mar-1931 (87 y.o. Janyth Contes Primary Care Provider: [name] Other Clinician: Referring Provider: Treating Provider/Extender: [name] , [name]: 17 Active Problems ICD-10 Encounter Code Description Active Date MDM Diagnosis S31.103D Unspecified open wound of abdominal wall, right lower quadrant without 10/09/2020 No Yes penetration into peritoneal cavity, subsequent encounter Inactive Problems ICD-10 Code Description Active Date Inactive Date B37.9 Candidiasis, unspecified 01/04/2021 01/04/2021 Resolved Problems Electronic Signature(s) Signed: 02/09/2021 7:13:41 AM By: Linton Ham MD Entered By: Linton Ham on 02/08/2021 12:08:02 -------------------------------------------------------------------------------- Progress Note Details Patient Name: Date of Service: [name] 02/08/2021 10:30 A M Medical Record Number: [id number] Patient Account Number: [id number] Date of Birth/Sex: Treating RN: 1931/08/18 (87 y.o. [name] Primary Care Provider: [name] Other Clinician: Referring Provider: Treating Provider/Extender: [name] , [name]: 17 Subjective History of Present Illness (HPI) ADMISSION 10/09/2020 This is a pleasant 34 year old man from assisted living at [facility] from [place] accompanied by his wife. They are here for a many month progression of the wounds in the right lower abdominal pannus fold. This initially was noted apparently in June 2021. There is some suggestion that  he may have scratched this area and opened the wound but the patient does not remember doing this. They were using silver alginate in this area for a while but more recently a hydrocolloid. The area is not  painful. There is also suggestion that they are using nystatin for a while however they are not doing this currently. The patient is fairly adamant that he is not allowing his waistband of his clothing to rub on this area Past medical history; idiopathic peripheral neuropathy, osteoarthritis, BPH, falls, lumbar spinal stenosis, sleep apnea, small vessel DVT hypertension, lower , extremity edema. The patient walks for short distances with a walker the rest of the time he is in a wheelchair. They moved into the assisted living order a friend[facility] home [place] about 9 months ago previously were at independent level 2/15; right lower abdominal pannus fold wound. This was a bit atypical last week but I elected to treat this is the usual friction type wound in the pannus fold using silver alginate offloading the area etc. He had one larger area and a smaller area both of these seem better and surface area has improved 3/1; right lower abdominal pannus fold. He presented with a bit of an atypical wound raised and hyper granular. However this is gradually improved in terms of condition of the wound surface as well as surface area with standard treatment for a pannus/friction area wound. We have been using silver alginate they are changing this at friend's home [place] 3/25; little over 3 weeks since we've seen this man. He has been a typical wound in the right lateral abdomen in the middle of pannus folds however the pannus folds themselves were not that impressive. I've been managing this conservatively and the wound volume certainly has come down. We've been using Alginate 4/8; right lateral abdomen in the middle of the pannus folds however the pannus folds themselves are not that impressive. Somewhat  friable surface we have been using silver alginate and this is gradually improving. I have always found the wound surface to be a bit atypical nevertheless as this is been gradually getting smaller I have spared the patient a biopsy 4/29; patient presents for 3-week follow-up. He has been using Hydrofera Blue to the abdominal wound and has noticed improvement. He has developed a rash along his groin. He says it started yesterday. They have tried antifungal cream on it. 5/13; this is a patient I have been treating for a open wound in the middle of the right lateral abdominal pannus fold. Although he has pannus folds there is certainly not as impressive is some we see and nor did he have any obvious major tinea or bacterial infection in the surrounding skin. He says that the area closed over about a week ago only that he noticed another area more anteriorly in the last 2 days. No obvious trauma. This is not at a level where his waistband usually is he is just not sure how this happened 6/3; all of the patient's pannus fold wounds are closed including the new ones we identified last time. Objective Constitutional Sitting or standing Blood Pressure is within target range for patient.. Pulse regular and within target range for patient.Marland Kitchen Respirations regular, non-labored and within target range.. Temperature is normal and within the target range for the patient.Marland Kitchen Appears in no distress. Vitals Time Taken: 11:02 AM, Height: 69 in, Weight: 222 lbs, BMI: 32.8, Temperature: 97.9 F, Pulse: 66 bpm, Respiratory Rate: 18 breaths/min, Blood Pressure: 125/76 mmHg. General Notes: Wound exam; all of the patient's pannus fold wounds are closed. His skin looks good Integumentary (Hair, Skin) Wound #3 status is Healed - Epithelialized. Original cause of wound was Gradually Appeared. The date acquired was: 01/16/2021. The wound  has been in treatment 3 weeks. The wound is located on the Inferior Abdomen - midline. The  wound measures 0cm length x 0cm width x 0cm depth; 0cm^2 area and 0cm^3 volume. There is no tunneling or undermining noted. There is a none present amount of drainage noted. The wound margin is flat and intact. There is no granulation within the wound bed. There is no necrotic tissue within the wound bed. Assessment Active Problems ICD-10 Unspecified open wound of abdominal wall, right lower quadrant without penetration into peritoneal cavity, subsequent encounter Plan Discharge From [facility] Services: Discharge from [facility] healed!! Continue to keep skin folds clean and dry. 1. The patient can be discharged from the wound care center 2. Advised to keep his pannus folds clean and dry although that is never look like it was a problem here. We never had significant intertrigo with either fungal or bacterial cellulitis Electronic Signature(s) Signed: 02/09/2021 7:13:41 AM By: Linton Ham MD Entered By: Linton Ham on 02/08/2021 12:10:41 -------------------------------------------------------------------------------- SuperBill Details Patient Name: Date of Service: [name] 02/08/2021 Medical Record Number: [id number] Patient Account Number: [id number] Date of Birth/Sex: Treating RN: Feb 04, 1931 (87 y.o. Janyth Contes Primary Care Provider: [name] Other Clinician: Referring Provider: Treating Provider/Extender: [name] , [name]: 17 Diagnosis Coding ICD-10 Codes Code Description S31.103D Unspecified open wound of abdominal wall, right lower quadrant without penetration into peritoneal cavity, subsequent encounter Facility Procedures CPT4 Code: [id number] Description: 30160 - WOUND CARE VISIT-LEV 2 EST PT Modifier: Quantity: 1 Physician Procedures Electronic Signature(s) Signed: 02/09/2021 7:13:41 AM By: Linton Ham MD Entered By: Linton Ham on 02/08/2021 12:10:55

## 2021-03-22 ENCOUNTER — Non-Acute Institutional Stay: Payer: Medicare PPO | Admitting: Nurse Practitioner

## 2021-03-22 ENCOUNTER — Encounter: Payer: Self-pay | Admitting: Nurse Practitioner

## 2021-03-22 DIAGNOSIS — I872 Venous insufficiency (chronic) (peripheral): Secondary | ICD-10-CM | POA: Diagnosis not present

## 2021-03-22 DIAGNOSIS — F419 Anxiety disorder, unspecified: Secondary | ICD-10-CM

## 2021-03-22 DIAGNOSIS — M150 Primary generalized (osteo)arthritis: Secondary | ICD-10-CM

## 2021-03-22 DIAGNOSIS — K5900 Constipation, unspecified: Secondary | ICD-10-CM

## 2021-03-22 DIAGNOSIS — M8949 Other hypertrophic osteoarthropathy, multiple sites: Secondary | ICD-10-CM

## 2021-03-22 DIAGNOSIS — E039 Hypothyroidism, unspecified: Secondary | ICD-10-CM | POA: Diagnosis not present

## 2021-03-22 DIAGNOSIS — G6289 Other specified polyneuropathies: Secondary | ICD-10-CM

## 2021-03-22 DIAGNOSIS — M159 Polyosteoarthritis, unspecified: Secondary | ICD-10-CM

## 2021-03-22 DIAGNOSIS — E7800 Pure hypercholesterolemia, unspecified: Secondary | ICD-10-CM

## 2021-03-22 DIAGNOSIS — L89212 Pressure ulcer of right hip, stage 2: Secondary | ICD-10-CM

## 2021-03-22 DIAGNOSIS — K219 Gastro-esophageal reflux disease without esophagitis: Secondary | ICD-10-CM | POA: Diagnosis not present

## 2021-03-22 DIAGNOSIS — R350 Frequency of micturition: Secondary | ICD-10-CM | POA: Diagnosis not present

## 2021-03-22 DIAGNOSIS — R600 Localized edema: Secondary | ICD-10-CM

## 2021-03-22 DIAGNOSIS — E559 Vitamin D deficiency, unspecified: Secondary | ICD-10-CM | POA: Insufficient documentation

## 2021-03-22 NOTE — Assessment & Plan Note (Signed)
Peripheral neuropathy, weakness in legs, w/c for mobility, on Gabapentin 100mg  qd. Takes Vit B12. Had nerve conduction study 03/10/18

## 2021-03-22 NOTE — Assessment & Plan Note (Signed)
Edema BLE, takes Furosemide, Bun/creat 19/1.0 11/06/20

## 2021-03-22 NOTE — Assessment & Plan Note (Signed)
Anxiety, psych consult 08/09/20, the patient declined recommended Depakote. TSH 5.76 10/19/20

## 2021-03-22 NOTE — Assessment & Plan Note (Signed)
Hyperlipidemia, stable, takes Atorvastatin, update lipid panel.

## 2021-03-22 NOTE — Assessment & Plan Note (Signed)
OA, takign Mobic 15 mg qd since 05/01/20,  f/u Ortho

## 2021-03-22 NOTE — Assessment & Plan Note (Signed)
,   takes Omeprazole 20mg  qd. Hgb 12.5 10/19/20

## 2021-03-22 NOTE — Assessment & Plan Note (Signed)
Urinary frequency, stable, on Tamsulosin. underwent Urology evaluation in the past.

## 2021-03-22 NOTE — Assessment & Plan Note (Signed)
Vit D deficiency, Vit D level 23, taking Vit D

## 2021-03-22 NOTE — Assessment & Plan Note (Addendum)
Healed

## 2021-03-22 NOTE — Assessment & Plan Note (Signed)
Stable, takes Senokot S,

## 2021-03-22 NOTE — Progress Notes (Addendum)
Location:   [place] Room Number: [place] of Service:  [id number] [phone]) Provider:  Taetum Flewellen X Reyce Lubeck, NP  Parth Mccormac X, NP  Patient Care Team: Divine Imber X, NP as PCP - General (Internal Medicine) [name], [name], MD as Consulting Physician (Orthopedic Surgery) [name], MD as Consulting Physician (Gastroenterology) System, Provider Not In [name], MD as Consulting Physician (Neurosurgery) [name], MD as Referring Physician (Ophthalmology) Raylan Hanton X, NP as Nurse Practitioner (Internal Medicine) Almedia Balls, MD as Referring Physician (Orthopedic Surgery) [name], MD as Consulting Physician (Neurosurgery) Elsie Saas, MD as Consulting Physician (Orthopedic Surgery) Allyn Kenner, MD (Dermatology) Kathrynn Ducking, MD as Consulting Physician (Neurology) Franchot Gallo, MD as Consulting Physician (Urology)  Extended Emergency Contact Information Primary Emergency Contact: [name] Address: [address]          [place], [place] [address] [place] Phone: [phone] Relation: Spouse  Code Status:  DNR Goals of care: Advanced Directive information Advanced Directives 03/22/2021  Does Patient Have a Medical Advance Directive? Yes  Type of Paramedic of [place];Living will;Out of facility DNR (pink MOST or yellow form)  Does patient want to make changes to medical advance directive? No - Patient declined  Copy of Day in Chart? Yes - validated most recent copy scanned in chart (See row information)  Pre-existing out of facility DNR order (yellow form or pink MOST form) Pink MOST form placed in chart (order not valid for inpatient use)     Chief Complaint  Patient presents with   Medical Management of Chronic Issues    Routine follow up   Health Maintenance    Discuss need for shingles vaccine and PNA vaccine.     HPI:  Pt  is a 87 y.o. male seen today for medical management of chronic diseases.       Peripheral neuropathy, weakness in legs, w/c for mobility, on Gabapentin 178m qd. Takes Vit B12. Had nerve conduction study 03/10/18             R groin pressure ulcer, saw Wound Care specialist, treated with Silver Alginate.             Anxiety, psych consult 08/09/20, the patient declined recommended Depakote. TSH 5.76 10/19/20             Left knee OA, takign Mobic 15 mg qd since 05/01/20, X-ray R hip/knee, f/u Ortho             Urinary frequency, stable, on Tamsulosin. underwent Urology evaluation in the past.              Hypothyroidism, takes Levothyroxine, TSH 5.76 10/19/20             Edema BLE, takes Furosemide, Bun/creat 19/1.0 11/06/20             Constipation, takes Senokot S,             GERD, takes Omeprazole 275mqd. Hgb 12.5 10/19/20  Hyperlipidemia, stable, takes Atorvastatin  Vit D deficiency, Vit D level 23, taking Vit D       Past Medical History:  Diagnosis Date   Abnormal MRI, knee    Per [facility] Patient Packet    Anemia    Anticoagulated    Anxiety    Arthritis    Cervical disc disorder    Per  [facility] New Patient Packet    Complication of anesthesia    states "I was [place] for several weeks after surgery"caused by tramadol   Constipation    Dysuria 07/28/2016   Edema 07/24/2016   Gait abnormality 07/25/2016   Hard of hearing    Head injury    As a result of a fall, Per [facility] New Patient Packet    Hip arthritis 07/21/2016   History of fall 07/25/2016   Hypercholesteremia    Hypertension    Lumbar spinal stenosis    Memory loss 07/28/2016   mild   Obesity    Per [facility] New Patient Packet    Osteoarthritis of right hip 07/24/2016   Peripheral neuropathy 03/10/2018   Sleep apnea    cpap   Spinal stenosis of lumbar region 05/28/2016   Urinary frequency    Venous thrombosis of leg 07/24/2016   right gastrocnemius   Vertigo    Wears glasses    Weight loss 04/08/2016   Past  Surgical History:  Procedure Laterality Date   BACK SURGERY  10/17, 80's   COLONOSCOPY     COLONOSCOPY     Per [facility] New Patient Packet, Dr.Schooler    ESOPHAGOGASTRODUODENOSCOPY     LUMBAR LAMINECTOMY/DECOMPRESSION MICRODISCECTOMY Right 05/28/2016   Procedure: Right Lumbar Three-Four Microdiscectomy;  Surgeon: [name], MD;  Location: [facility];  Service: Neurosurgery;  Laterality: Right;   TOTAL HIP ARTHROPLASTY Right 07/21/2016   Procedure: TOTAL HIP ARTHROPLASTY ANTERIOR APPROACH;  Surgeon: [name], MD;  Location: [facility];  Service: Orthopedics;  Laterality: Right;    Allergies  Allergen Reactions   Tramadol Other (See Comments)    "does not eat" LOSS OF APPETITE    Allergies as of 03/22/2021       Reactions   Tramadol Other (See Comments)   "does not eat" LOSS OF APPETITE        Medication List        Accurate as of March 22, 2021 11:59 PM. If you have any questions, ask your nurse or doctor.          acetaminophen 500 MG tablet Commonly known as: TYLENOL Take 1,000 mg by mouth every 8 (eight) hours as needed.   aspirin EC 81 MG tablet Take 81 mg by mouth daily. 8am.   atorvastatin 20 MG tablet Commonly known as: LIPITOR Take 20 mg by mouth daily. 8pm.   B-12 1000 MCG Tabs Take 1 tablet by mouth every other day. 8am.   CoQ10 100 MG Caps Take 1 capsule by mouth daily. 8am.   diphenhydrAMINE 25 MG tablet Commonly known as: BENADRYL Take 25 mg by mouth as needed.   fexofenadine 180 MG tablet Commonly known as: ALLEGRA Take 180 mg by mouth daily as needed for allergies or rhinitis.   fluticasone 50 MCG/ACT nasal spray Commonly known as: FLONASE Place 2 sprays into both nostrils as needed.   furosemide 20 MG tablet Commonly known as: LASIX Take 20 mg by mouth. Mon, Wed, Fri   gabapentin 100 MG capsule Commonly known as: NEURONTIN Take 100 mg by mouth at bedtime. 8pm.   Glucosamine 500 MG Caps Take by mouth. Take 3 pills to equal  1538m daily.   hydrocortisone 2.5 % cream Apply topically 2 (two) times daily as needed.   ketoconazole 2 % cream Commonly known as: NIZORAL Apply 1 application topically 2 (two) times daily as needed for irritation.   levothyroxine 50 MCG tablet Commonly known as:  SYNTHROID Take 50 mcg by mouth daily before breakfast.   meclizine 25 MG tablet Commonly known as: ANTIVERT Take 25 mg by mouth 3 (three) times daily as needed for dizziness.   meloxicam 15 MG tablet Commonly known as: MOBIC Take 15 mg by mouth daily.   MUCINEX DM PO Take 600 mg by mouth 2 (two) times daily.   NAT-RUL PSYLLIUM SEED HUSKS PO Take 1 tablet by mouth as needed. For constipation.   omeprazole 20 MG capsule Commonly known as: PRILOSEC Take 20 mg by mouth daily.   potassium chloride 10 MEQ tablet Commonly known as: KLOR-CON Take 10 mEq by mouth daily.   sennosides-docusate sodium 8.6-50 MG tablet Commonly known as: SENOKOT-S Take 1 tablet by mouth daily.   sodium chloride 0.65 % Soln nasal spray Commonly known as: OCEAN Place 2 sprays into both nostrils as needed for congestion.   tamsulosin 0.4 MG Caps capsule Commonly known as: FLOMAX Take 0.4 mg by mouth at bedtime.        Review of Systems  Constitutional:  Negative for fatigue, fever and unexpected weight change.  HENT:  Positive for hearing loss. Negative for congestion and voice change.   Eyes:  Negative for visual disturbance.  Respiratory:  Negative for cough and shortness of breath.   Cardiovascular:  Positive for leg swelling.  Gastrointestinal:  Negative for abdominal pain and constipation.  Genitourinary:  Positive for frequency. Negative for difficulty urinating and urgency.  Musculoskeletal:  Positive for arthralgias, back pain and gait problem.       R+L knees, left shoulder, s/p cervical spine+lumber spien surgeries. Mostly pain is in the right knee presently.   Skin:  Negative for color change.  Neurological:   Positive for weakness and numbness. Negative for speech difficulty.       Memory lapses. Tingling, numbness, weakness in legs.  Psychiatric/Behavioral:  Negative for behavioral problems, dysphoric mood and sleep disturbance. The patient is not nervous/anxious.    Immunization History  Administered Date(s) Administered   Influenza-Unspecified 06/11/2015, 06/11/2019, 06/19/2020   Moderna Sars-Covid-2 Vaccination 09/10/2019, 10/08/2019, 07/17/2020, 02/05/2021   PFIZER(Purple Top)SARS-COV-2 Vaccination 09/10/2019   Pneumococcal Polysaccharide-23 04/21/2011   Pneumococcal-Unspecified 04/21/2019   Td 04/22/2002   Tetanus 05/16/2020   Zoster, Live 04/27/2012   Pertinent  Health Maintenance Due  Topic Date Due   PNA vac Low Risk Adult (2 of 2 - PCV13) 04/20/2020   INFLUENZA VACCINE  04/08/2021   Fall Risk  03/24/2019 02/08/2018 07/28/2016  Falls in the past year? 0 Yes Yes  Number falls in past yr: 0 1 1  Injury with Fall? 0 No No  Risk for fall due to : - - History of fall(s);Impaired balance/gait;Impaired mobility  Follow up - - Falls evaluation completed   Functional Status Survey:    Vitals:   03/22/21 0907  BP: 126/84  Pulse: 72  Resp: 20  Temp: (!) 96.8 F (36 C)  SpO2: 95%  Weight: 206 lb 12.8 oz (93.8 kg)  Height: '5\' 4"'  (1.626 m)   Body mass index is 35.5 kg/m. Physical Exam Vitals and nursing note reviewed.  Constitutional:      Appearance: Normal appearance.  HENT:     Head: Normocephalic and atraumatic.     Mouth/Throat:     Mouth: Mucous membranes are moist.  Eyes:     Extraocular Movements: Extraocular movements intact.     Conjunctiva/sclera: Conjunctivae normal.     Pupils: Pupils are equal, round, and reactive to light.  Cardiovascular:  Rate and Rhythm: Normal rate and regular rhythm.     Heart sounds: No murmur heard. Pulmonary:     Effort: Pulmonary effort is normal.     Breath sounds: No rales.  Abdominal:     General: Bowel sounds are normal.      Palpations: Abdomen is soft.     Tenderness: There is no abdominal tenderness.  Musculoskeletal:     Cervical back: Normal range of motion and neck supple.     Right lower leg: Edema present.     Left lower leg: Edema present.     Comments: 1+ edema BLE. Left shoulder pain with ROM is chronic. Chronic R+L knee pain-mostly in the left knee, knee support L knee. S/p R hips replacement. S/p cervical/lumbar spine surgeries.   Skin:    General: Skin is warm and dry.  Neurological:     General: No focal deficit present.     Mental Status: He is alert. Mental status is at baseline.     Motor: Weakness present.     Gait: Gait abnormal.     Comments: Moves slow, uses electric w/c for mobility,  lower body weakness, peripheral neuropathy in legs.   Psychiatric:        Mood and Affect: Mood normal.        Behavior: Behavior normal.        Thought Content: Thought content normal.     Comments: Oriented to person, place. Appears anxious or obsessed or repetitive with his health concerns.     Labs reviewed: Recent Labs    09/07/20 0000 10/19/20 0000 11/06/20 0000  NA 141 141 143  K 3.9 4.6 4.0  CL 105 106 105  CO2 29* 27* 29*  BUN '20 19 19  ' CREATININE 1.0 0.8 1.0  CALCIUM 8.7 8.4* 9.1   Recent Labs    09/07/20 0000 10/19/20 0000  AST 17 16  ALT 15 16  ALKPHOS 86 80  ALBUMIN 3.9 3.4*   Recent Labs    09/07/20 0000 10/19/20 0000  WBC 5.1 4.4  NEUTROABS 3,545.00 2,794.00  HGB 13.4* 12.5*  HCT 40* 38*  PLT 170 148*   Lab Results  Component Value Date   TSH 5.76 10/19/2020   No results found for: HGBA1C Lab Results  Component Value Date   CHOL 139 02/24/2020   HDL 37 02/24/2020   LDLCALC 82 02/24/2020   TRIG 100 02/24/2020   CHOLHDL 5.1 (H) 03/31/2019    Significant Diagnostic Results in last 30 days:  No results found.  Assessment/Plan Hypothyroidism takes Levothyroxine, TSH 5.76 10/19/20, will update TSH, CBC/diff, CMP/eGFR  Edema of both lower legs due  to peripheral venous insufficiency Edema BLE, takes Furosemide, Bun/creat 19/1.0 11/06/20  Constipation Stable, takes Senokot S,  GERD (gastroesophageal reflux disease) , takes Omeprazole 68m qd. Hgb 12.5 10/19/20  Hypercholesteremia Hyperlipidemia, stable, takes Atorvastatin, update lipid panel.   Vitamin D deficiency Vit D deficiency, Vit D level 23, taking Vit D    Urinary frequency Urinary frequency, stable, on Tamsulosin. underwent Urology evaluation in the past.   Osteoarthritis, multiple sites OA, takign Mobic 15 mg qd since 05/01/20,  f/u Ortho  Anxiety Anxiety, psych consult 08/09/20, the patient declined recommended Depakote. TSH 5.76 10/19/20  Pressure ulcer of right hip, stage 2 (HCC) R groin pressure ulcer, saw Wound Care specialist, treated with Silver Alginate.  Axonal sensorimotor neuropathy Peripheral neuropathy, weakness in legs, w/c for mobility, on Gabapentin 1077mqd. Takes Vit B12. Had nerve conduction  study 03/10/18    Family/ staff Communication: plan of care reviewed with the patient and charge nurse.   Labs/tests ordered:  CBC/diff, CMP/eGFR, TSH, lipid panel.   Time spend 40 minutes.

## 2021-03-22 NOTE — Assessment & Plan Note (Signed)
takes Levothyroxine, TSH 5.76 10/19/20, will update TSH, CBC/diff, CMP/eGFR

## 2021-03-26 DIAGNOSIS — R946 Abnormal results of thyroid function studies: Secondary | ICD-10-CM | POA: Diagnosis not present

## 2021-03-26 DIAGNOSIS — I10 Essential (primary) hypertension: Secondary | ICD-10-CM | POA: Diagnosis not present

## 2021-03-26 LAB — BASIC METABOLIC PANEL
BUN: 16 (ref 4–21)
CO2: 29 — AB (ref 13–22)
Chloride: 106 (ref 99–108)
Creatinine: 0.9 (ref 0.6–1.3)
Glucose: 85
Potassium: 4.1 (ref 3.4–5.3)
Sodium: 141 (ref 137–147)

## 2021-03-26 LAB — HEPATIC FUNCTION PANEL
ALT: 14 (ref 10–40)
AST: 18 (ref 14–40)
Alkaline Phosphatase: 91 (ref 25–125)
Bilirubin, Total: 0.6

## 2021-03-26 LAB — COMPREHENSIVE METABOLIC PANEL
Albumin: 3.9 (ref 3.5–5.0)
Calcium: 8.8 (ref 8.7–10.7)
Globulin: 2.6

## 2021-03-26 LAB — CBC AND DIFFERENTIAL
HCT: 42 (ref 41–53)
Hemoglobin: 13.5 (ref 13.5–17.5)
Neutrophils Absolute: 2786
Platelets: 173 (ref 150–399)
WBC: 4.3

## 2021-03-26 LAB — CBC: RBC: 4.7 (ref 3.87–5.11)

## 2021-03-26 LAB — LIPID PANEL
Cholesterol: 132 (ref 0–200)
HDL: 36 (ref 35–70)
LDL Cholesterol: 75
LDl/HDL Ratio: 3.7
Triglycerides: 126 (ref 40–160)

## 2021-03-26 LAB — TSH: TSH: 5.96 — AB (ref 0.41–5.90)

## 2021-04-08 ENCOUNTER — Encounter: Payer: Self-pay | Admitting: Nurse Practitioner

## 2021-04-08 ENCOUNTER — Non-Acute Institutional Stay: Payer: Medicare PPO | Admitting: Nurse Practitioner

## 2021-04-08 DIAGNOSIS — I872 Venous insufficiency (chronic) (peripheral): Secondary | ICD-10-CM | POA: Diagnosis not present

## 2021-04-08 DIAGNOSIS — E538 Deficiency of other specified B group vitamins: Secondary | ICD-10-CM

## 2021-04-08 DIAGNOSIS — F419 Anxiety disorder, unspecified: Secondary | ICD-10-CM

## 2021-04-08 DIAGNOSIS — K219 Gastro-esophageal reflux disease without esophagitis: Secondary | ICD-10-CM

## 2021-04-08 DIAGNOSIS — R350 Frequency of micturition: Secondary | ICD-10-CM

## 2021-04-08 DIAGNOSIS — E039 Hypothyroidism, unspecified: Secondary | ICD-10-CM | POA: Diagnosis not present

## 2021-04-08 DIAGNOSIS — J309 Allergic rhinitis, unspecified: Secondary | ICD-10-CM

## 2021-04-08 DIAGNOSIS — M8949 Other hypertrophic osteoarthropathy, multiple sites: Secondary | ICD-10-CM

## 2021-04-08 DIAGNOSIS — G6289 Other specified polyneuropathies: Secondary | ICD-10-CM

## 2021-04-08 DIAGNOSIS — K5900 Constipation, unspecified: Secondary | ICD-10-CM

## 2021-04-08 DIAGNOSIS — M25511 Pain in right shoulder: Secondary | ICD-10-CM | POA: Diagnosis not present

## 2021-04-08 DIAGNOSIS — E7800 Pure hypercholesterolemia, unspecified: Secondary | ICD-10-CM

## 2021-04-08 DIAGNOSIS — M159 Polyosteoarthritis, unspecified: Secondary | ICD-10-CM

## 2021-04-08 DIAGNOSIS — E559 Vitamin D deficiency, unspecified: Secondary | ICD-10-CM

## 2021-04-08 DIAGNOSIS — G8929 Other chronic pain: Secondary | ICD-10-CM | POA: Insufficient documentation

## 2021-04-08 DIAGNOSIS — R600 Localized edema: Secondary | ICD-10-CM

## 2021-04-08 NOTE — Assessment & Plan Note (Addendum)
Hx of, flare up 1-2 weeks, pain with ROM, especially over head movement, pain palpated on the top of shoulder, likely capsulitis, will apply BioFreeze cram qid, therapy to eval/tx. 08/27/20 Ortho a right shoulder subacromial steroid injection

## 2021-04-08 NOTE — Progress Notes (Signed)
Location:   [place] Room Number: [place] of Service:  ALF [phone]) Provider:  Jatavion Peaster X Keenon Leitzel, NP  Ambrea Hegler X, NP  Patient Care Team: Ralphine Hinks X, NP as PCP - General (Internal Medicine) [name], [name], MD as Consulting Physician (Orthopedic Surgery) [name], MD as Consulting Physician (Gastroenterology) System, Provider Not In [name], MD as Consulting Physician (Neurosurgery) [name], MD as Referring Physician (Ophthalmology) Shanah Guimaraes X, NP as Nurse Practitioner (Internal Medicine) Almedia Balls, MD as Referring Physician (Orthopedic Surgery) [name], MD as Consulting Physician (Neurosurgery) Elsie Saas, MD as Consulting Physician (Orthopedic Surgery) Allyn Kenner, MD (Dermatology) Kathrynn Ducking, MD as Consulting Physician (Neurology) Franchot Gallo, MD as Consulting Physician (Urology)  Extended Emergency Contact Information Primary Emergency Contact: [name] Address: [address]          [place], [place] [address] [place] Phone: [phone] Relation: Spouse  Code Status:  DNR Goals of care: Advanced Directive information Advanced Directives 04/08/2021  Does Patient Have a Medical Advance Directive? Yes  Type of Paramedic of [place];Living will;Out of facility DNR (pink MOST or yellow form)  Does patient want to make changes to medical advance directive? No - Patient declined  Copy of [place] in Chart? Yes - validated most recent copy scanned in chart (See row information)  Pre-existing out of facility DNR order (yellow form or pink MOST form) Yellow form placed in chart (order not valid for inpatient use);Pink MOST form placed in chart (order not valid for inpatient use)     Chief Complaint  Patient presents with   Acute Visit    Patient complains of right shoulder pain     HPI:  Pt is a 87 y.o. male  seen today for an acute visit for right shoulder pain with ROM, mostly overhead movement x 1-2 weeks, recurrent, denied injury or trauma. 08/27/20 Ortho a right shoulder subacromial steroid injection   Peripheral neuropathy, weakness in legs, w/c for mobility, on Gabapentin '100mg'$  qd. Takes Vit B12. Had nerve conduction study 03/10/18             Anxiety, psych consult 08/09/20, the patient declined recommended Depakote. TSH 5.76 10/19/20             Left knee OA, takign Mobic 15 mg qd since 05/01/20, X-ray R hip/knee, f/u Ortho             Urinary frequency, stable, on Tamsulosin. underwent Urology evaluation in the past.              Hypothyroidism, takes Levothyroxine, TSH 5.96 03/26/21             Edema BLE, takes Furosemide, Bun/creat 16/0.93 719/22             Constipation, takes Senokot S,             GERD, takes Omeprazole '20mg'$  qd. Hgb 13.5 03/26/21             Hyperlipidemia, stable, takes Atorvastatin             Vit D deficiency, Vit D level 23, taking Vit D  Allergic rhinitis, takes Fluticasone nasal spray, Fexofenadine.   Vit B12 deficiency, takes Vit B12   Past Medical History:  Diagnosis Date   Abnormal MRI, knee    Per [facility] New Patient Packet  Anemia    Anticoagulated    Anxiety    Arthritis    Cervical disc disorder    Per [facility] New Patient Packet    Complication of anesthesia    states "I was in [place] for several weeks after surgery"caused by tramadol   Constipation    Dysuria 07/28/2016   Edema 07/24/2016   Gait abnormality 07/25/2016   Hard of hearing    Head injury    As a result of a fall, Per [facility] New Patient Packet    Hip arthritis 07/21/2016   History of fall 07/25/2016   Hypercholesteremia    Hypertension    Lumbar spinal stenosis    Memory loss 07/28/2016   mild   Obesity    Per [facility] New Patient Packet    Osteoarthritis of right hip 07/24/2016   Peripheral neuropathy 03/10/2018   Sleep apnea    cpap   Spinal stenosis of lumbar region 05/28/2016    Urinary frequency    Venous thrombosis of leg 07/24/2016   right gastrocnemius   Vertigo    Wears glasses    Weight loss 04/08/2016   Past Surgical History:  Procedure Laterality Date   BACK SURGERY  10/17, 80's   COLONOSCOPY     COLONOSCOPY     Per [facility] New Patient Packet, Dr.Schooler    ESOPHAGOGASTRODUODENOSCOPY     LUMBAR LAMINECTOMY/DECOMPRESSION MICRODISCECTOMY Right 05/28/2016   Procedure: Right Lumbar Three-Four Microdiscectomy;  Surgeon: [name], MD;  Location: [place] NEUR[facility] ORS;  Service: Neurosurgery;  Laterality: Right;   TOTAL HIP ARTHROPLASTY Right 07/21/2016   Procedure: TOTAL HIP ARTHROPLASTY ANTERIOR APPROACH;  Surgeon: [name], MD;  Location: [place];  Service: Orthopedics;  Laterality: Right;    Allergies  Allergen Reactions   Tramadol Other (See Comments)    "does not eat" LOSS OF APPETITE    Allergies as of 04/08/2021       Reactions   Tramadol Other (See Comments)   "does not eat" LOSS OF APPETITE        Medication List        Accurate as of April 08, 2021 11:59 PM. If you have any questions, ask your nurse or doctor.          acetaminophen 500 MG tablet Commonly known as: TYLENOL Take 1,000 mg by mouth every 8 (eight) hours as needed.   aspirin EC 81 MG tablet Take 81 mg by mouth daily. 8am.   atorvastatin 20 MG tablet Commonly known as: LIPITOR Take 20 mg by mouth daily. 8pm.   B-12 1000 MCG Tabs Take 1 tablet by mouth every other day. 8am.   CoQ10 100 MG Caps Take 1 capsule by mouth daily. 8am.   diphenhydrAMINE 25 MG tablet Commonly known as: BENADRYL Take 25 mg by mouth as needed.   fexofenadine 180 MG tablet Commonly known as: ALLEGRA Take 180 mg by mouth daily as needed for allergies or rhinitis.   fluticasone 50 MCG/ACT nasal spray Commonly known as: FLONASE Place 2 sprays into both nostrils as needed.   furosemide 20 MG tablet Commonly known as: LASIX Take 20 mg by mouth. Mon, Wed, Fri   gabapentin 100 MG  capsule Commonly known as: NEURONTIN Take 100 mg by mouth at bedtime. 8pm.   Glucosamine 500 MG Caps Take by mouth. Take 3 pills to equal '1500mg'$  daily.   hydrocortisone 2.5 % cream Apply topically 2 (two) times daily as needed.   ketoconazole 2 % cream Commonly known  as: NIZORAL Apply 1 application topically 2 (two) times daily as needed for irritation.   levothyroxine 50 MCG tablet Commonly known as: SYNTHROID Take 50 mcg by mouth daily before breakfast.   meclizine 25 MG tablet Commonly known as: ANTIVERT Take 25 mg by mouth 3 (three) times daily as needed for dizziness.   meloxicam 15 MG tablet Commonly known as: MOBIC Take 15 mg by mouth daily.   MUCINEX DM PO Take 600 mg by mouth 2 (two) times daily.   NAT-RUL PSYLLIUM SEED HUSKS PO Take 1 tablet by mouth as needed. For constipation.   nystatin powder Commonly known as: MYCOSTATIN/NYSTOP Apply 1 application topically every other day.   omeprazole 20 MG capsule Commonly known as: PRILOSEC Take 20 mg by mouth daily.   potassium chloride 10 MEQ tablet Commonly known as: KLOR-CON Take 10 mEq by mouth daily.   sennosides-docusate sodium 8.6-50 MG tablet Commonly known as: SENOKOT-S Take 1 tablet by mouth daily.   sodium chloride 0.65 % Soln nasal spray Commonly known as: OCEAN Place 2 sprays into both nostrils as needed for congestion.   tamsulosin 0.4 MG Caps capsule Commonly known as: FLOMAX Take 0.4 mg by mouth at bedtime.        Review of Systems  Constitutional:  Negative for fatigue, fever and unexpected weight change.  HENT:  Positive for hearing loss. Negative for congestion and voice change.   Eyes:  Negative for visual disturbance.  Respiratory:  Negative for cough and shortness of breath.   Cardiovascular:  Positive for leg swelling.  Gastrointestinal:  Negative for abdominal pain and constipation.  Genitourinary:  Positive for frequency. Negative for difficulty urinating and urgency.   Musculoskeletal:  Positive for arthralgias, back pain and gait problem.       R+L knees, R shoulder, s/p cervical spine+lumber spien surgeries. Mostly pain is in the R shoulder. ]  Skin:  Negative for color change.  Neurological:  Positive for weakness and numbness. Negative for speech difficulty.       Memory lapses. Tingling, numbness, weakness in legs.  Psychiatric/Behavioral:  Negative for behavioral problems, dysphoric mood and sleep disturbance. The patient is not nervous/anxious.    Immunization History  Administered Date(s) Administered   Influenza-Unspecified 06/11/2015, 06/11/2019, 06/19/2020   Moderna Sars-Covid-2 Vaccination 09/10/2019, 10/08/2019, 07/17/2020, 02/05/2021   PFIZER(Purple Top)SARS-COV-2 Vaccination 09/10/2019   Pneumococcal Polysaccharide-23 04/21/2011   Pneumococcal-Unspecified 04/21/2019   Td 04/22/2002   Tetanus 05/16/2020   Zoster, Live 04/27/2012   Pertinent  Health Maintenance Due  Topic Date Due   PNA vac Low Risk Adult (2 of 2 - PCV13) 04/20/2020   INFLUENZA VACCINE  04/08/2021   Fall Risk  03/24/2019 02/08/2018 07/28/2016  Falls in the past year? 0 Yes Yes  Number falls in past yr: 0 1 1  Injury with Fall? 0 No No  Risk for fall due to : - - History of fall(s);Impaired balance/gait;Impaired mobility  Follow up - - Falls evaluation completed   Functional Status Survey:    Vitals:   04/08/21 1452  BP: 126/70  Pulse: 64  Resp: 18  Temp: 98.8 F (37.1 C)  SpO2: 97%  Weight: 219 lb 12.8 oz (99.7 kg)  Height: '5\' 4"'$  (1.626 m)   Body mass index is 37.73 kg/m. Physical Exam Vitals and nursing note reviewed.  Constitutional:      Appearance: Normal appearance.  HENT:     Head: Normocephalic and atraumatic.  Eyes:     Extraocular Movements: Extraocular movements intact.  Conjunctiva/sclera: Conjunctivae normal.     Pupils: Pupils are equal, round, and reactive to light.  Cardiovascular:     Rate and Rhythm: Normal rate and regular  rhythm.     Heart sounds: No murmur heard. Pulmonary:     Effort: Pulmonary effort is normal.     Breath sounds: No rales.  Abdominal:     General: Bowel sounds are normal.     Palpations: Abdomen is soft.     Tenderness: There is no abdominal tenderness.  Musculoskeletal:        General: Tenderness present. No swelling, deformity or signs of injury.     Cervical back: Normal range of motion and neck supple.     Right lower leg: Edema present.     Left lower leg: Edema present.     Comments: 1+ edema BLE. R shoulder pain with ROM is chronic, the right supraspinatus tendon tenderness palpated, no redness/warmth/deformity noted.  Chronic R+L knee pain, knee support L knee. S/p R hips replacement. S/p cervical/lumbar spine surgeries.   Skin:    General: Skin is warm and dry.  Neurological:     General: No focal deficit present.     Mental Status: He is alert. Mental status is at baseline.     Motor: Weakness present.     Gait: Gait abnormal.     Comments: Moves slow, uses electric w/c for mobility,  lower body weakness, peripheral neuropathy in legs.   Psychiatric:        Mood and Affect: Mood normal.        Behavior: Behavior normal.        Thought Content: Thought content normal.     Comments: Oriented to person, place. Appears anxious or obsessed or repetitive with his health concerns.     Labs reviewed: Recent Labs    09/07/20 0000 10/19/20 0000 11/06/20 0000  NA 141 141 143  K 3.9 4.6 4.0  CL 105 106 105  CO2 29* 27* 29*  BUN '20 19 19  '$ CREATININE 1.0 0.8 1.0  CALCIUM 8.7 8.4* 9.1   Recent Labs    09/07/20 0000 10/19/20 0000  AST 17 16  ALT 15 16  ALKPHOS 86 80  ALBUMIN 3.9 3.4*   Recent Labs    09/07/20 0000 10/19/20 0000  WBC 5.1 4.4  NEUTROABS 3,545.00 2,794.00  HGB 13.4* 12.5*  HCT 40* 38*  PLT 170 148*   Lab Results  Component Value Date   TSH 5.76 10/19/2020   No results found for: HGBA1C Lab Results  Component Value Date   CHOL 139  02/24/2020   HDL 37 02/24/2020   LDLCALC 82 02/24/2020   TRIG 100 02/24/2020   CHOLHDL 5.1 (H) 03/31/2019    Significant Diagnostic Results in last 30 days:  No results found.  Assessment/Plan Chronic right shoulder pain Hx of, flare up 1-2 weeks, pain with ROM, especially over head movement, pain palpated on the top of shoulder, likely capsulitis, will apply BioFreeze cram qid, therapy to eval/tx. 08/27/20 Ortho a right shoulder subacromial steroid injection   Axonal sensorimotor neuropathy Peripheral neuropathy, weakness in legs, w/c for mobility, on Gabapentin '100mg'$  qd. Takes Vit B12. Had nerve conduction study 03/10/18  Anxiety psych consult 08/09/20, the patient declined recommended Depakote. TSH 5.76 10/19/20. Functioning well in [facility]  Osteoarthritis, multiple sites Left knee OA, takign Mobic 15 mg qd since 05/01/20, X-ray R hip/knee, f/u Ortho  Urinary frequency Urinary frequency, stable, on Tamsulosin. underwent Urology evaluation in  the past.  Hypothyroidism takes Levothyroxine, TSH 5.96 03/26/21  Edema of both lower legs due to peripheral venous insufficiency Edema BLE, takes Furosemide, Bun/creat 16/0.93 719/22  Constipation Stable, takes Senokot S,  GERD (gastroesophageal reflux disease) Stable, takes Omeprazole '20mg'$  qd. Hgb 13.5 03/26/21  Hypercholesteremia takes Atorvastatin  Vitamin D deficiency Vit D level 23, taking Vit D  Allergic rhinitis  takes Fluticasone nasal spray, Fexofenadine.   Vitamin B12 deficiency Takes Vit B12    Family/ staff Communication: plan of care reviewed with the patient and charge nurse.   Labs/tests ordered:  none  Time spend 40 minutes

## 2021-04-09 ENCOUNTER — Encounter: Payer: Self-pay | Admitting: Nurse Practitioner

## 2021-04-09 DIAGNOSIS — E538 Deficiency of other specified B group vitamins: Secondary | ICD-10-CM | POA: Insufficient documentation

## 2021-04-09 DIAGNOSIS — J309 Allergic rhinitis, unspecified: Secondary | ICD-10-CM | POA: Insufficient documentation

## 2021-04-09 NOTE — Assessment & Plan Note (Signed)
Stable, takes Senokot S,

## 2021-04-09 NOTE — Assessment & Plan Note (Signed)
Vit D level 23, taking Vit D 

## 2021-04-09 NOTE — Assessment & Plan Note (Signed)
Urinary frequency, stable, on Tamsulosin. underwent Urology evaluation in the past.

## 2021-04-09 NOTE — Assessment & Plan Note (Signed)
Left knee OA, takign Mobic 15 mg qd since 05/01/20, X-ray R hip/knee, f/u Ortho

## 2021-04-09 NOTE — Assessment & Plan Note (Signed)
Peripheral neuropathy, weakness in legs, w/c for mobility, on Gabapentin '100mg'$  qd. Takes Vit B12. Had nerve conduction study 03/10/18

## 2021-04-09 NOTE — Assessment & Plan Note (Signed)
takes Levothyroxine, TSH 5.96 03/26/21

## 2021-04-09 NOTE — Assessment & Plan Note (Signed)
Stable, takes Omeprazole '20mg'$  qd. Hgb 13.5 03/26/21

## 2021-04-09 NOTE — Assessment & Plan Note (Signed)
takes Atorvastatin  

## 2021-04-09 NOTE — Assessment & Plan Note (Signed)
Takes Vit B12

## 2021-04-09 NOTE — Assessment & Plan Note (Signed)
Edema BLE, takes Furosemide, Bun/creat 16/0.93 719/22

## 2021-04-09 NOTE — Assessment & Plan Note (Signed)
takes Fluticasone nasal spray, Fexofenadine.  

## 2021-04-09 NOTE — Assessment & Plan Note (Signed)
psych consult 08/09/20, the patient declined recommended Depakote. TSH 5.76 10/19/20. Functioning well in [facility]

## 2021-04-10 DIAGNOSIS — R2681 Unsteadiness on feet: Secondary | ICD-10-CM | POA: Diagnosis not present

## 2021-04-10 DIAGNOSIS — M6281 Muscle weakness (generalized): Secondary | ICD-10-CM | POA: Diagnosis not present

## 2021-04-10 DIAGNOSIS — M79621 Pain in right upper arm: Secondary | ICD-10-CM | POA: Diagnosis not present

## 2021-04-10 DIAGNOSIS — R29898 Other symptoms and signs involving the musculoskeletal system: Secondary | ICD-10-CM | POA: Diagnosis not present

## 2021-04-10 DIAGNOSIS — M158 Other polyosteoarthritis: Secondary | ICD-10-CM | POA: Diagnosis not present

## 2021-04-10 DIAGNOSIS — R296 Repeated falls: Secondary | ICD-10-CM | POA: Diagnosis not present

## 2021-04-10 DIAGNOSIS — M25511 Pain in right shoulder: Secondary | ICD-10-CM | POA: Diagnosis not present

## 2021-04-23 ENCOUNTER — Non-Acute Institutional Stay: Payer: Medicare PPO | Admitting: Internal Medicine

## 2021-04-23 ENCOUNTER — Encounter: Payer: Self-pay | Admitting: Internal Medicine

## 2021-04-23 DIAGNOSIS — H8113 Benign paroxysmal vertigo, bilateral: Secondary | ICD-10-CM | POA: Diagnosis not present

## 2021-04-23 DIAGNOSIS — I10 Essential (primary) hypertension: Secondary | ICD-10-CM | POA: Diagnosis not present

## 2021-04-23 DIAGNOSIS — H81319 Aural vertigo, unspecified ear: Secondary | ICD-10-CM | POA: Diagnosis not present

## 2021-04-23 LAB — CBC: RBC: 4.55 (ref 3.87–5.11)

## 2021-04-23 LAB — CBC AND DIFFERENTIAL
HCT: 41 (ref 41–53)
Hemoglobin: 13.4 — AB (ref 13.5–17.5)
Neutrophils Absolute: 2870
Platelets: 173 (ref 150–399)
WBC: 4.6

## 2021-04-23 LAB — HEPATIC FUNCTION PANEL
ALT: 14 (ref 10–40)
AST: 18 (ref 14–40)
Alkaline Phosphatase: 87 (ref 25–125)
Bilirubin, Total: 0.5

## 2021-04-23 LAB — BASIC METABOLIC PANEL
BUN: 17 (ref 4–21)
CO2: 29 — AB (ref 13–22)
Chloride: 106 (ref 99–108)
Creatinine: 1 (ref 0.6–1.3)
Glucose: 92
Potassium: 4.6 (ref 3.4–5.3)
Sodium: 140 (ref 137–147)

## 2021-04-23 LAB — COMPREHENSIVE METABOLIC PANEL
Albumin: 3.8 (ref 3.5–5.0)
Calcium: 8.7 (ref 8.7–10.7)
Globulin: 2.4

## 2021-04-23 NOTE — Progress Notes (Signed)
Location: [place] Room Number: [place] of Service:  ALF [phone])  Provider: Veleta Miners MD  Code Status: Full Code Managed Care Goals of Care:  Advanced Directives 04/23/2021  Does Patient Have a Medical Advance Directive? Yes  Type of Paramedic of [place];Living will;Out of facility DNR (pink MOST or yellow form)  Does patient want to make changes to medical advance directive? No - Patient declined  Copy of [place] in Chart? Yes - validated most recent copy scanned in chart (See row information)  Pre-existing out of facility DNR order (yellow form or pink MOST form) Yellow form placed in chart (order not valid for inpatient use);Pink MOST form placed in chart (order not valid for inpatient use)     Chief Complaint  Patient presents with   Acute Visit    Not feeling well    HPI: Patient is a 87 y.o. male seen today for an acute visit for Dizziness with Nausea  Patient has h/o Peripheral Neuropathy Idiopathic uses wheel chair BPH with urinary Frequency,  LE edema,  Arthritis, Sleep Apnea, Hyperlipidemia Cognitive impairment  Seen today in his room for not feeling good C/o Dizziness and ? Vertigo Room spinning Symptoms are better today Did get Meclizine this morning and feels better. Feels Nauseated but no vomiting Did eat his breakfast Able to go to Bathroom by himself   Past Medical History:  Diagnosis Date   Abnormal MRI, knee    Per [facility] New Patient Packet    Anemia    Anticoagulated    Anxiety    Arthritis    Cervical disc disorder    Per [facility] New Patient Packet    Complication of anesthesia    states "I was in [place] for several weeks after surgery"caused by tramadol   Constipation    Dysuria 07/28/2016   Edema 07/24/2016   Gait abnormality 07/25/2016   Hard of hearing    Head injury    As a result of a fall, Per [facility] New Patient Packet    Hip arthritis 07/21/2016   History of  fall 07/25/2016   Hypercholesteremia    Hypertension    Lumbar spinal stenosis    Memory loss 07/28/2016   mild   Obesity    Per [facility] New Patient Packet    Osteoarthritis of right hip 07/24/2016   Peripheral neuropathy 03/10/2018   Sleep apnea    cpap   Spinal stenosis of lumbar region 05/28/2016   Urinary frequency    Venous thrombosis of leg 07/24/2016   right gastrocnemius   Vertigo    Wears glasses    Weight loss 04/08/2016    Past Surgical History:  Procedure Laterality Date   BACK SURGERY  10/17, 80's   COLONOSCOPY     COLONOSCOPY     Per [facility] New Patient Packet, Dr.Schooler    ESOPHAGOGASTRODUODENOSCOPY     LUMBAR LAMINECTOMY/DECOMPRESSION MICRODISCECTOMY Right 05/28/2016   Procedure: Right Lumbar Three-Four Microdiscectomy;  Surgeon: [name], MD;  Location: [place] [facility];  Service: Neurosurgery;  Laterality: Right;   TOTAL HIP ARTHROPLASTY Right 07/21/2016   Procedure: TOTAL HIP ARTHROPLASTY ANTERIOR APPROACH;  Surgeon: [name], MD;  Location: [place];  Service: Orthopedics;  Laterality: Right;    Allergies  Allergen Reactions   Tramadol Other (See Comments)    "does not eat" LOSS OF APPETITE    Outpatient Encounter Medications as of 04/23/2021  Medication Sig   acetaminophen (  TYLENOL) 500 MG tablet Take 1,000 mg by mouth every 8 (eight) hours as needed.   aspirin EC 81 MG tablet Take 81 mg by mouth daily. 8am.   atorvastatin (LIPITOR) 20 MG tablet Take 20 mg by mouth daily. 8pm.   Coenzyme Q10 (COQ10) 100 MG CAPS Take 1 capsule by mouth daily. 8am.   Cyanocobalamin (B-12) 1000 MCG TABS Take 1 tablet by mouth every other day. 8am.   Dextromethorphan-guaiFENesin (MUCINEX DM PO) Take 600 mg by mouth 2 (two) times daily.   diphenhydrAMINE (BENADRYL) 25 MG tablet Take 25 mg by mouth as needed.    fexofenadine (ALLEGRA) 180 MG tablet Take 180 mg by mouth daily as needed for allergies or rhinitis.   fluticasone (FLONASE) 50 MCG/ACT nasal spray Place 2 sprays  into both nostrils as needed.    furosemide (LASIX) 20 MG tablet Take 20 mg by mouth. Mon, Wed, Fri   gabapentin (NEURONTIN) 100 MG capsule Take 100 mg by mouth at bedtime. 8pm.   Glucosamine 500 MG CAPS Take by mouth. Take 3 pills to equal '1500mg'$  daily.   hydrocortisone 2.5 % cream Apply topically 2 (two) times daily as needed.   ketoconazole (NIZORAL) 2 % cream Apply 1 application topically 2 (two) times daily as needed for irritation.   levothyroxine (SYNTHROID) 50 MCG tablet Take 50 mcg by mouth daily before breakfast.   meclizine (ANTIVERT) 12.5 MG tablet Take 12.5 mg by mouth at bedtime.   meclizine (ANTIVERT) 25 MG tablet Take 25 mg by mouth 3 (three) times daily as needed for dizziness.   meloxicam (MOBIC) 15 MG tablet Take 15 mg by mouth daily.    Menthol, Topical Analgesic, (BIOFREEZE) 4 % GEL Apply topically in the morning, at noon, in the evening, and at bedtime.   NAT-RUL PSYLLIUM SEED HUSKS PO Take 1 tablet by mouth as needed. For constipation.   nystatin (MYCOSTATIN/NYSTOP) powder Apply 1 application topically every other day.   omeprazole (PRILOSEC) 20 MG capsule Take 20 mg by mouth daily.   potassium chloride (KLOR-CON) 10 MEQ tablet Take 10 mEq by mouth daily.   sennosides-docusate sodium (SENOKOT-S) 8.6-50 MG tablet Take 1 tablet by mouth daily.   sodium chloride (OCEAN) 0.65 % SOLN nasal spray Place 2 sprays into both nostrils as needed for congestion.   tamsulosin (FLOMAX) 0.4 MG CAPS capsule Take 0.4 mg by mouth at bedtime.   No facility-administered encounter medications on file as of 04/23/2021.    Review of Systems:  Review of Systems  Constitutional:  Positive for activity change and appetite change.  HENT:  Positive for congestion.   Respiratory: Negative.    Cardiovascular:  Positive for leg swelling.  Gastrointestinal: Negative.   Genitourinary:  Positive for frequency.  Musculoskeletal:  Positive for gait problem.  Skin: Negative.   Neurological:  Positive  for dizziness.  Psychiatric/Behavioral:  Positive for confusion.    Health Maintenance  Topic Date Due   Zoster Vaccines- Shingrix (1 of 2) Never done   PNA vac Low Risk Adult (2 of 2 - PCV13) 04/20/2020   INFLUENZA VACCINE  04/08/2021   TETANUS/TDAP  05/16/2030   COVID-19 Vaccine  Completed   HPV VACCINES  Aged Out    Physical Exam: Vitals:   04/23/21 1419  BP: (!) 150/90  Pulse: 78  Resp: 18  Temp: 99 F (37.2 C)  SpO2: 94%  Weight: 220 lb (99.8 kg)  Height: '5\' 4"'$  (1.626 m)   Body mass index is 37.76 kg/m. Physical Exam  Constitutional: Oriented to person, place, and time. Well-developed and well-nourished.  HENT:  Head: Normocephalic.  Mouth/Throat: Oropharynx is clear and moist.  Eyes: Pupils are equal, round, and reactive to light.  Neck: Neck supple.  Cardiovascular: Normal rate and normal heart sounds.  No murmur heard. Pulmonary/Chest: Effort normal and breath sounds normal. No respiratory distress. No wheezes. Has no rales.  Abdominal: Soft. Bowel sounds are normal. No distension. There is no tenderness. There is no rebound.  Musculoskeletal: Edema Present Bilateral Lymphadenopathy: none Neurological: Alert and oriented to person, place, and time.  No Nystagmus Dizziness with Position Change No Focal deficits Skin: Skin is warm and dry.  Psychiatric: Normal mood and affect. Behavior is normal. Thought content normal.   Labs reviewed: Basic Metabolic Panel: Recent Labs    05/18/20 0000 09/07/20 0000 10/19/20 0000 11/06/20 0000 03/26/21 0000  NA  --    < > 141 143 141  K  --    < > 4.6 4.0 4.1  CL  --    < > 106 105 106  CO2  --    < > 27* 29* 29*  BUN  --    < > '19 19 16  '$ CREATININE  --    < > 0.8 1.0 0.9  CALCIUM  --    < > 8.4* 9.1 8.8  TSH 4.34  --  5.76  --  5.96*   < > = values in this interval not displayed.   Liver Function Tests: Recent Labs    09/07/20 0000 10/19/20 0000 03/26/21 0000  AST '17 16 18  '$ ALT '15 16 14  '$ ALKPHOS 86 80  91  ALBUMIN 3.9 3.4* 3.9   No results for input(s): LIPASE, AMYLASE in the last 8760 hours. No results for input(s): AMMONIA in the last 8760 hours. CBC: Recent Labs    09/07/20 0000 10/19/20 0000 03/26/21 0000  WBC 5.1 4.4 4.3  NEUTROABS 3,545.00 2,794.00 2,786.00  HGB 13.4* 12.5* 13.5  HCT 40* 38* 42  PLT 170 148* 173   Lipid Panel: Recent Labs    03/26/21 0000  CHOL 132  HDL 36  LDLCALC 75  TRIG 126   No results found for: HGBA1C  Procedures since last visit: No results found.  Assessment/Plan BPPV Start on Meclizine 12.5 mg QHS Continue Prn Mecklaizine TID CBC,CMP to rule out acute infection All Labs came back Normal  Other Issues  Primary osteoarthritis involving multiple joints On Meloxicam. Refuses to stop Has Appointment with Dr Philipp Ovens Idiopathic peripheral neuropathy On Neurontin Uses Power Chair Hypothyroidism, unspecified type TSH normal Bilateral leg edema Doing well on Lasix Hyperlipidemia, unspecified hyperlipidemia type On Statin LDL less then 100 Urinary Frequency On Flomax Labs/tests ordered:  * No order type specified * Next appt:  Visit date not found

## 2021-04-30 DIAGNOSIS — R2681 Unsteadiness on feet: Secondary | ICD-10-CM | POA: Diagnosis not present

## 2021-04-30 DIAGNOSIS — R296 Repeated falls: Secondary | ICD-10-CM | POA: Diagnosis not present

## 2021-04-30 DIAGNOSIS — M79621 Pain in right upper arm: Secondary | ICD-10-CM | POA: Diagnosis not present

## 2021-04-30 DIAGNOSIS — M158 Other polyosteoarthritis: Secondary | ICD-10-CM | POA: Diagnosis not present

## 2021-04-30 DIAGNOSIS — R29898 Other symptoms and signs involving the musculoskeletal system: Secondary | ICD-10-CM | POA: Diagnosis not present

## 2021-04-30 DIAGNOSIS — M6281 Muscle weakness (generalized): Secondary | ICD-10-CM | POA: Diagnosis not present

## 2021-04-30 DIAGNOSIS — M25511 Pain in right shoulder: Secondary | ICD-10-CM | POA: Diagnosis not present

## 2021-05-01 DIAGNOSIS — N390 Urinary tract infection, site not specified: Secondary | ICD-10-CM | POA: Diagnosis not present

## 2021-05-08 DIAGNOSIS — M79621 Pain in right upper arm: Secondary | ICD-10-CM | POA: Diagnosis not present

## 2021-05-08 DIAGNOSIS — M25511 Pain in right shoulder: Secondary | ICD-10-CM | POA: Diagnosis not present

## 2021-05-08 DIAGNOSIS — M158 Other polyosteoarthritis: Secondary | ICD-10-CM | POA: Diagnosis not present

## 2021-05-08 DIAGNOSIS — R296 Repeated falls: Secondary | ICD-10-CM | POA: Diagnosis not present

## 2021-05-08 DIAGNOSIS — R29898 Other symptoms and signs involving the musculoskeletal system: Secondary | ICD-10-CM | POA: Diagnosis not present

## 2021-05-08 DIAGNOSIS — R2681 Unsteadiness on feet: Secondary | ICD-10-CM | POA: Diagnosis not present

## 2021-05-08 DIAGNOSIS — M6281 Muscle weakness (generalized): Secondary | ICD-10-CM | POA: Diagnosis not present

## 2021-05-30 DIAGNOSIS — R946 Abnormal results of thyroid function studies: Secondary | ICD-10-CM | POA: Diagnosis not present

## 2021-05-30 DIAGNOSIS — I10 Essential (primary) hypertension: Secondary | ICD-10-CM | POA: Diagnosis not present

## 2021-05-31 LAB — LIPID PANEL
Cholesterol: 114 (ref 0–200)
HDL: 25 — AB (ref 35–70)
LDL Cholesterol: 66
LDl/HDL Ratio: 4.6
Triglycerides: 149 (ref 40–160)

## 2021-08-20 ENCOUNTER — Encounter: Payer: Self-pay | Admitting: Internal Medicine

## 2021-08-20 ENCOUNTER — Non-Acute Institutional Stay: Payer: Medicare PPO | Admitting: Internal Medicine

## 2021-08-20 DIAGNOSIS — R600 Localized edema: Secondary | ICD-10-CM | POA: Diagnosis not present

## 2021-08-20 DIAGNOSIS — H8113 Benign paroxysmal vertigo, bilateral: Secondary | ICD-10-CM | POA: Diagnosis not present

## 2021-08-20 DIAGNOSIS — E785 Hyperlipidemia, unspecified: Secondary | ICD-10-CM

## 2021-08-20 DIAGNOSIS — I872 Venous insufficiency (chronic) (peripheral): Secondary | ICD-10-CM

## 2021-08-20 DIAGNOSIS — M159 Polyosteoarthritis, unspecified: Secondary | ICD-10-CM

## 2021-08-20 DIAGNOSIS — E039 Hypothyroidism, unspecified: Secondary | ICD-10-CM | POA: Diagnosis not present

## 2021-08-20 DIAGNOSIS — G609 Hereditary and idiopathic neuropathy, unspecified: Secondary | ICD-10-CM

## 2021-08-20 DIAGNOSIS — R350 Frequency of micturition: Secondary | ICD-10-CM | POA: Diagnosis not present

## 2021-08-20 NOTE — Progress Notes (Signed)
Location:   [facility] Room Number: [place]:  ALF [phone]) Provider:  Veleta Miners MD  Mast, Man X, NP  Patient Care Team: Mast, Man X, NP as PCP - General (Internal Medicine) [name], [name], MD as Consulting Physician (Orthopedic Surgery) [name], MD as Consulting Physician (Gastroenterology) System, Provider Not In [name], MD as Consulting Physician (Neurosurgery) [name], MD as Referring Physician (Ophthalmology) Mast, Man X, NP as Nurse Practitioner (Internal Medicine) Almedia Balls, MD as Referring Physician (Orthopedic Surgery) [name], MD as Consulting Physician (Neurosurgery) Elsie Saas, MD as Consulting Physician (Orthopedic Surgery) Allyn Kenner, MD (Dermatology) Kathrynn Ducking, MD as Consulting Physician (Neurology) Franchot Gallo, MD as Consulting Physician (Urology)  Extended Emergency Contact Information Primary Emergency Contact: [name] Address: [address]          [place], [place] [address] [place] Phone: [phone] Relation: Spouse  Code Status:  Full Code Managed Care Goals of care: Advanced Directive information Advanced Directives 08/20/2021  Does Patient Have a Medical Advance Directive? Yes  Type of Advance Directive Living will;Healthcare Power of Attorney  Does patient want to make changes to medical advance directive? No - Patient declined  Copy of [place] in Chart? Yes - validated most recent copy scanned in chart (See row information)  Pre-existing out of facility DNR order (yellow form or pink MOST form) -     Chief Complaint  Patient presents with   Medical Management of Chronic Issues   Quality Metric Gaps    Zoster Vaccines- Shingrix (1 of 2)   Pneumonia Vaccine 48+ Years old (2 - PCV)    HPI:  Pt is a 87 y.o. male seen today for medical management of chronic diseases.    Patient  has h/o Peripheral Neuropathy Idiopathic uses wheel chair BPH with urinary Frequency,  LE edema,  Arthritis, Sleep Apnea, Hyperlipidemia Cognitive impairment H/o BPPV    He is stable. No new Nursing issues. No Behavior issues His weight is stable No Falls Wt Readings from Last 3 Encounters:  08/20/21 218 lb 14.4 oz (99.3 kg)  04/23/21 220 lb (99.8 kg)  04/08/21 219 lb 12.8 oz (99.7 kg)  Uses Power Chair His wife says he is Incontinence and has many accidents He does not like to use Pull ups  Also as New open small Skin Wound in his inguinal  area Nurses doing Dressings Has seen Wound care before  Past Medical History:  Diagnosis Date   Abnormal MRI, knee    Per [facility] New Patient Packet    Anemia    Anticoagulated    Anxiety    Arthritis    Cervical disc disorder    Per [facility] New Patient Packet    Complication of anesthesia    states "I was in [place] for several weeks after surgery"caused by tramadol   Constipation    Dysuria 07/28/2016   Edema 07/24/2016   Gait abnormality 07/25/2016   Hard of hearing    Head injury    As a result of a fall, Per [facility] New Patient Packet    Hip arthritis 07/21/2016   History of fall 07/25/2016   Hypercholesteremia    Hypertension    Lumbar spinal stenosis    Memory loss 07/28/2016   mild   Obesity    Per [facility] New Patient Packet    Osteoarthritis  of right hip 07/24/2016   Peripheral neuropathy 03/10/2018   Sleep apnea    cpap   Spinal stenosis of lumbar region 05/28/2016   Urinary frequency    Venous thrombosis of leg 07/24/2016   right gastrocnemius   Vertigo    Wears glasses    Weight loss 04/08/2016   Past Surgical History:  Procedure Laterality Date   BACK SURGERY  10/17, 80's   COLONOSCOPY     COLONOSCOPY     Per [facility] Patient Packet, Dr.Schooler    ESOPHAGOGASTRODUODENOSCOPY     LUMBAR LAMINECTOMY/DECOMPRESSION MICRODISCECTOMY Right 05/28/2016   Procedure: Right Lumbar Three-Four Microdiscectomy;  Surgeon: [name], MD;  Location: [facility];  Service: Neurosurgery;  Laterality: Right;   TOTAL HIP ARTHROPLASTY Right 07/21/2016   Procedure: TOTAL HIP ARTHROPLASTY ANTERIOR APPROACH;  Surgeon: [name], MD;  Location: [facility];  Service: Orthopedics;  Laterality: Right;    Allergies  Allergen Reactions   Tramadol Other (See Comments)    "does not eat" LOSS OF APPETITE    Allergies as of 08/20/2021       Reactions   Tramadol Other (See Comments)   "does not eat" LOSS OF APPETITE        Medication List        Accurate as of August 20, 2021  3:51 PM. If you have any questions, ask your nurse or doctor.          acetaminophen 500 MG tablet Commonly known as: TYLENOL Take 1,000 mg by mouth every 8 (eight) hours as needed.   aspirin EC 81 MG tablet Take 81 mg by mouth daily. 8am.   atorvastatin 20 MG tablet Commonly known as: LIPITOR Take 20 mg by mouth daily. 8pm.   B-12 1000 MCG Tabs Take 1 tablet by mouth every other day. 8am.   Biofreeze 4 % Gel Generic drug: Menthol (Topical Analgesic) Apply topically in the morning, at noon, in the evening, and at bedtime.   CoQ10 100 MG Caps Take 1 capsule by mouth daily. 8am.   diphenhydrAMINE 25 MG tablet Commonly known as: BENADRYL Take 25 mg by mouth as needed.   fexofenadine 180 MG tablet Commonly known as: ALLEGRA Take 180 mg by mouth daily as needed for allergies or rhinitis.   fluticasone 50 MCG/ACT nasal spray Commonly known as: FLONASE Place 2 sprays into both nostrils as needed.   furosemide 20 MG tablet Commonly known as: LASIX Take 20 mg by mouth. Mon, Wed, Fri   gabapentin 100 MG capsule Commonly known as: NEURONTIN Take 100 mg by mouth at bedtime. 8pm.   Glucosamine 500 MG Caps Take by mouth. Take 3 pills to equal 1500mg  daily.   hydrocortisone 2.5 % cream Apply topically 2 (two) times daily as needed.   ketoconazole 2 % cream Commonly known as: NIZORAL Apply 1 application topically 2  (two) times daily as needed for irritation.   levothyroxine 50 MCG tablet Commonly known as: SYNTHROID Take 50 mcg by mouth daily before breakfast.   meclizine 25 MG tablet Commonly known as: ANTIVERT Take 25 mg by mouth 3 (three) times daily as needed for dizziness.   meloxicam 15 MG tablet Commonly known as: MOBIC Take 15 mg by mouth daily.   MUCINEX DM PO Take 600 mg by mouth 2 (two) times daily.   NAT-RUL PSYLLIUM SEED HUSKS PO Take 1 tablet by mouth as needed. For constipation.   nystatin powder Commonly known as: MYCOSTATIN/NYSTOP Apply 1 application topically every  other day.   omeprazole 20 MG capsule Commonly known as: PRILOSEC Take 20 mg by mouth daily.   potassium chloride 10 MEQ tablet Commonly known as: KLOR-CON M Take 10 mEq by mouth daily.   sennosides-docusate sodium 8.6-50 MG tablet Commonly known as: SENOKOT-S Take 1 tablet by mouth daily.   sodium chloride 0.65 % Soln nasal spray Commonly known as: OCEAN Place 2 sprays into both nostrils as needed for congestion.   tamsulosin 0.4 MG Caps capsule Commonly known as: FLOMAX Take 0.4 mg by mouth at bedtime.        Review of Systems  Constitutional:  Negative for activity change, appetite change and unexpected weight change.  HENT: Negative.    Respiratory:  Negative for cough and shortness of breath.   Cardiovascular:  Positive for leg swelling.  Gastrointestinal:  Negative for constipation.  Genitourinary:  Positive for frequency and urgency.  Musculoskeletal:  Positive for gait problem. Negative for arthralgias and myalgias.  Skin:  Positive for wound. Negative for rash.  Neurological:  Positive for numbness. Negative for dizziness and weakness.  Psychiatric/Behavioral:  Negative for confusion and sleep disturbance.   All other systems reviewed and are negative.  Immunization History  Administered Date(s) Administered   Influenza-Unspecified 06/11/2015, 06/11/2019, 06/19/2020, 06/27/2021    Moderna Sars-Covid-2 Vaccination 09/10/2019, 10/08/2019, 07/17/2020, 02/05/2021   PFIZER(Purple Top)SARS-COV-2 Vaccination 09/10/2019   Pneumococcal Polysaccharide-23 04/21/2011   Pneumococcal-Unspecified 04/21/2019   Td 04/22/2002   Tetanus 05/16/2020   Unspecified SARS-COV-2 Vaccination 05/28/2021   Zoster, Live 04/27/2012   Pertinent  Health Maintenance Due  Topic Date Due   INFLUENZA VACCINE  Completed   Fall Risk 07/28/2016 02/08/2018 03/24/2019  Falls in the past year? Yes Yes 0  Was there an injury with Fall? No No 0  Fall Risk Category Calculator - - 0  Fall Risk Category - - Low  Patient Fall Risk Level - - Low fall risk  Patient at Risk for Falls Due to History of fall(s);Impaired balance/gait;Impaired mobility - -  Fall risk Follow up Falls evaluation completed - -   Functional Status Survey:    Vitals:   08/20/21 1516  BP: 131/74  Pulse: 71  Resp: 18  Temp: (!) 97.3 F (36.3 C)  SpO2: 95%  Weight: 218 lb 14.4 oz (99.3 kg)  Height: 5\' 4"  (1.626 m)   Body mass index is 37.57 kg/m. Physical Exam Vitals reviewed.  Constitutional:      Appearance: Normal appearance.  HENT:     Head: Normocephalic.     Mouth/Throat:     Mouth: Mucous membranes are moist.     Pharynx: Oropharynx is clear.  Eyes:     Pupils: Pupils are equal, round, and reactive to light.  Cardiovascular:     Rate and Rhythm: Normal rate and regular rhythm.     Pulses: Normal pulses.     Heart sounds: No murmur heard. Pulmonary:     Effort: Pulmonary effort is normal. No respiratory distress.     Breath sounds: Normal breath sounds. No rales.  Abdominal:     General: Abdomen is flat. Bowel sounds are normal.     Palpations: Abdomen is soft.  Musculoskeletal:        General: Swelling present.     Cervical back: Neck supple.  Skin:    General: Skin is warm.  Neurological:     General: No focal deficit present.     Mental Status: He is alert and oriented to person, place, and time.  Psychiatric:        Mood and Affect: Mood normal.        Thought Content: Thought content normal.    Labs reviewed: Recent Labs    11/06/20 0000 03/26/21 0000 04/23/21 0000  NA 143 141 140  K 4.0 4.1 4.6  CL 105 106 106  CO2 29* 29* 29*  BUN 19 16 17   CREATININE 1.0 0.9 1.0  CALCIUM 9.1 8.8 8.7   Recent Labs    10/19/20 0000 03/26/21 0000 04/23/21 0000  AST 16 18 18   ALT 16 14 14   ALKPHOS 80 91 87  ALBUMIN 3.4* 3.9 3.8   Recent Labs    10/19/20 0000 03/26/21 0000 04/23/21 0000  WBC 4.4 4.3 4.6  NEUTROABS 2,794.00 2,786.00 2,870.00  HGB 12.5* 13.5 13.4*  HCT 38* 42 41  PLT 148* 173 173   Lab Results  Component Value Date   TSH 5.96 (A) 03/26/2021   No results found for: HGBA1C Lab Results  Component Value Date   CHOL 114 05/31/2021   HDL 25 (A) 05/31/2021   LDLCALC 66 05/31/2021   TRIG 149 05/31/2021   CHOLHDL 5.1 (H) 03/31/2019    Significant Diagnostic Results in last 30 days:  No results found.  Assessment/Plan 1. Idiopathic peripheral neuropathy On Neurontin Has seen Dr Jannifer Franklin before Uses Power chair  2. Primary osteoarthritis involving multiple joints On Meloxicam  Refuses to stop it  3. Urinary frequency On Flomax Has seen Urology before  4. Hypothyroidism, unspecified type TSH slightly elevated in 7/22   5. Edema of both lower legs due to peripheral venous insufficiency On Lasix and Uses ted hoses Creat stable  6. Benign paroxysmal positional vertigo due to bilateral vestibular disorder Meclizine PRN  7. Hyperlipidemia, unspecified hyperlipidemia type On statin     Family/ staff Communication:   Labs/tests ordered:

## 2021-08-29 DIAGNOSIS — I10 Essential (primary) hypertension: Secondary | ICD-10-CM | POA: Diagnosis not present

## 2021-08-29 DIAGNOSIS — E7800 Pure hypercholesterolemia, unspecified: Secondary | ICD-10-CM | POA: Diagnosis not present

## 2021-08-29 DIAGNOSIS — R946 Abnormal results of thyroid function studies: Secondary | ICD-10-CM | POA: Diagnosis not present

## 2021-08-30 LAB — TSH: TSH: 6.36 — AB (ref 0.41–5.90)

## 2021-09-06 ENCOUNTER — Encounter: Payer: Self-pay | Admitting: Nurse Practitioner

## 2021-09-06 ENCOUNTER — Non-Acute Institutional Stay (INDEPENDENT_AMBULATORY_CARE_PROVIDER_SITE_OTHER): Payer: Medicare PPO | Admitting: Nurse Practitioner

## 2021-09-06 DIAGNOSIS — Z0000 Encounter for general adult medical examination without abnormal findings: Secondary | ICD-10-CM | POA: Diagnosis not present

## 2021-09-06 NOTE — Progress Notes (Signed)
Subjective:   [name] is a 87 y.o. male who presents for Medicare Annual/Subsequent preventive examination at [place].     Objective:    Today's Vitals   09/06/21 0947 09/06/21 1402  BP: 120/76   Pulse: 76   Resp: 17   Temp: 98.4 F (36.9 C)   SpO2: 99%   Weight: 218 lb 14.4 oz (99.3 kg)   Height: 5\' 4"  (1.626 m)   PainSc:  5    Body mass index is 37.57 kg/m.  Advanced Directives 09/06/2021 08/20/2021 04/23/2021 04/08/2021 03/22/2021 07/11/2020 05/10/2020  Does Patient Have a Medical Advance Directive? Yes Yes Yes Yes Yes Yes Yes  Type of Paramedic of [place];Living will;Out of facility DNR (pink MOST or yellow form) Living will;Healthcare Power of [place];Living will;Out of facility DNR (pink MOST or yellow form) [place];Living will;Out of facility DNR (pink MOST or yellow form) [place];Living will;Out of facility DNR (pink MOST or yellow form) [place];Living will;Out of facility DNR (pink MOST or yellow form) [place];Living will;Out of facility DNR (pink MOST or yellow form)  Does patient want to make changes to medical advance directive? No - Patient declined No - Patient declined No - Patient declined No - Patient declined No - Patient declined No - Patient declined No - Patient declined  Copy of [place] in Chart? Yes - validated most recent copy scanned in chart (See row information) Yes - validated most recent copy scanned in chart (See row information) Yes - validated most recent copy scanned in chart (See row information) Yes - validated most recent copy scanned in chart (See row information) Yes - validated most recent copy scanned in chart (See row information) Yes - validated most recent copy scanned in chart (See row information) Yes - validated most recent copy scanned in chart (See  row information)  Pre-existing out of facility DNR order (yellow form or pink MOST form) Pink MOST form placed in chart (order not valid for inpatient use) - Yellow form placed in chart (order not valid for inpatient use);Pink MOST form placed in chart (order not valid for inpatient use) Yellow form placed in chart (order not valid for inpatient use);Pink MOST form placed in chart (order not valid for inpatient use) Pink MOST form placed in chart (order not valid for inpatient use) Pink MOST form placed in chart (order not valid for inpatient use) Pink MOST form placed in chart (order not valid for inpatient use)    Current Medications (verified) Outpatient Encounter Medications as of 09/06/2021  Medication Sig   acetaminophen (TYLENOL) 500 MG tablet Take 1,000 mg by mouth every 8 (eight) hours as needed.   aspirin EC 81 MG tablet Take 81 mg by mouth daily. 8am.   atorvastatin (LIPITOR) 20 MG tablet Take 20 mg by mouth daily. 8pm.   Coenzyme Q10 (COQ10) 100 MG CAPS Take 1 capsule by mouth daily. 8am.   Cyanocobalamin (B-12) 1000 MCG TABS Take 1 tablet by mouth every other day. 8am.   Dextromethorphan-guaiFENesin (MUCINEX DM PO) Take 600 mg by mouth 2 (two) times daily.   diphenhydrAMINE (BENADRYL) 25 MG tablet Take 25 mg by mouth as needed.    fexofenadine (ALLEGRA) 180 MG tablet Take 180 mg by mouth daily as needed for allergies or rhinitis.   fluticasone (FLONASE) 50 MCG/ACT nasal spray Place 2 sprays into both nostrils  as needed.    furosemide (LASIX) 20 MG tablet Take 20 mg by mouth. Mon, Wed, Fri   gabapentin (NEURONTIN) 100 MG capsule Take 100 mg by mouth at bedtime. 8pm.   Glucosamine 500 MG CAPS Take by mouth. Take 3 pills to equal 1500mg  daily.   hydrocortisone 2.5 % cream Apply topically 2 (two) times daily as needed.   ketoconazole (NIZORAL) 2 % cream Apply 1 application topically 2 (two) times daily as needed for irritation.   levothyroxine (SYNTHROID) 50 MCG tablet Take 50 mcg by  mouth daily before breakfast.   meclizine (ANTIVERT) 25 MG tablet Take 25 mg by mouth 3 (three) times daily as needed for dizziness.   meloxicam (MOBIC) 15 MG tablet Take 15 mg by mouth daily.    Menthol, Topical Analgesic, (BIOFREEZE) 4 % GEL Apply topically in the morning, at noon, in the evening, and at bedtime.   NAT-RUL PSYLLIUM SEED HUSKS PO Take 1 tablet by mouth as needed. For constipation.   nystatin (MYCOSTATIN/NYSTOP) powder Apply 1 application topically every other day.   omeprazole (PRILOSEC) 20 MG capsule Take 20 mg by mouth daily.   potassium chloride (KLOR-CON) 10 MEQ tablet Take 10 mEq by mouth daily.   sennosides-docusate sodium (SENOKOT-S) 8.6-50 MG tablet Take 1 tablet by mouth daily.   sodium chloride (OCEAN) 0.65 % SOLN nasal spray Place 2 sprays into both nostrils as needed for congestion.   tamsulosin (FLOMAX) 0.4 MG CAPS capsule Take 0.4 mg by mouth at bedtime.   No facility-administered encounter medications on file as of 09/06/2021.    Allergies (verified) Tramadol   History: Past Medical History:  Diagnosis Date   Abnormal MRI, knee    Per [place] New Patient Packet    Anemia    Anticoagulated    Anxiety    Arthritis    Cervical disc disorder    Per [facility] New Patient Packet    Complication of anesthesia    states "[place] was [place] several weeks after surgery"caused by tramadol   Constipation    Dysuria 07/28/2016   Edema 07/24/2016   Gait abnormality 07/25/2016   Hard of hearing    Head injury    As a result of a fall, Per [facility] New Patient Packet    Hip arthritis 07/21/2016   History of fall 07/25/2016   Hypercholesteremia    Hypertension    Lumbar spinal stenosis    Memory loss 07/28/2016   mild   Obesity    Per [facility] New Patient Packet    Osteoarthritis of right hip 07/24/2016   Peripheral neuropathy 03/10/2018   Sleep apnea    cpap   Spinal stenosis of lumbar region 05/28/2016   Urinary frequency    Venous thrombosis of leg 07/24/2016    right gastrocnemius   Vertigo    Wears glasses    Weight loss 04/08/2016   Past Surgical History:  Procedure Laterality Date   BACK SURGERY  10/17, 80's   COLONOSCOPY     COLONOSCOPY     Per [facility] New Patient Packet, Dr.Schooler    ESOPHAGOGASTRODUODENOSCOPY     LUMBAR LAMINECTOMY/DECOMPRESSION MICRODISCECTOMY Right 05/28/2016   Procedure: Right Lumbar Three-Four Microdiscectomy;  Surgeon: [name], MD;  Location: [place] NEURO ORS;  Service: Neurosurgery;  Laterality: Right;   TOTAL HIP ARTHROPLASTY Right 07/21/2016   Procedure: TOTAL HIP ARTHROPLASTY ANTERIOR APPROACH;  Surgeon: [name], MD;  Location: [place];  Service: Orthopedics;  Laterality: Right;   Family History  Problem Relation Age of Onset   Heart disease Mother    Transient ischemic attack Mother    Kidney failure Father    Alzheimer's disease Sister    AAA (abdominal aortic aneurysm) Sister    Lung cancer Brother    Memory loss Sister    Memory loss Sister    Heart disease Sister    Cancer Sister    Diabetes Sister    Cancer Sister    Social History   Socioeconomic History   Marital status: Married    Spouse name: Not on file   Number of children: 2   Years of education: MS   Highest education level: Not on file  Occupational History   Not on file  Tobacco Use   Smoking status: Former    Packs/day: 1.00    Years: 4.00    Pack years: 4.00    Types: Cigarettes    Quit date: 07/16/1968    Years since quitting: 53.1   Smokeless tobacco: Never   Tobacco comments:    quit 50 years ago  Vaping Use   Vaping Use: Never used  Substance and Sexual Activity   Alcohol use: No   Drug use: No   Sexual activity: Not Currently    Partners: Female  Other Topics Concern   Not on file  Social History Narrative   Lives w/ wife   Caffeine use: Coffee daily   Right handed    Retired. Prior rehab counselor. Resident at [facility] since about 2001.      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 Yes   Do you have difficulty preparing food or eating? Yes   Do you have difficulty managing your medications? Yes   Do you have difficulty managing your finances? Yes   Do you have difficulty affording your medications? No   Social Determinants of Radio broadcast assistant Strain: Not on file  Food Insecurity: Not on file  Transportation Needs: Not on file  Physical Activity: Not on file  Stress: Not on file  Social Connections: Not on file    Tobacco Counseling Counseling given: Not Answered Tobacco comments: quit 50 years ago   Clinical Intake:  Pre-visit preparation completed: Yes  Pain : 0-10 Pain Score: 5  Pain Location: Knee Pain Orientation: Right, Left Pain Descriptors / Indicators: Aching, Discomfort, Dull, Nagging Pain Onset: More than a month ago Pain Frequency: Several days a week Pain Relieving Factors: rest, w/c for mobility, Tylenol. Effect of Pain on Daily Activities: limited walking  Pain Relieving Factors: rest, w/c for mobility, Tylenol.  BMI - recorded: 37.57 Nutritional Status: BMI > 30  Obese Nutritional Risks: None Diabetes: No  How often do you need to have someone help you when you read instructions, pamphlets, or other written materials from your doctor or  pharmacy?: 1 - Never What is the last grade level you completed in school?: master degree  Diabetic?no  Interpreter Needed?: No  Information entered by :: Lachrisha Ziebarth Bretta Bang NP   Activities of Daily Living In your  present state of health, do you have any difficulty performing the following activities: 09/06/2021  Hearing? N  Vision? N  Difficulty concentrating or making decisions? N  Walking or climbing stairs? Y  Dressing or bathing? Y  Doing errands, shopping? Y  Preparing Food and eating ? N  Using the Toilet? N  In the past six months, have you accidently leaked urine? Y  Do you have problems with loss of bowel control? N  Managing your Medications? Y  Managing your Finances? Y  Housekeeping or managing your Housekeeping? Y  Some recent data might be hidden    Patient Care Team: Kynadee Dam X, NP as PCP - General (Internal Medicine) [name], [name], MD as Consulting Physician (Orthopedic Surgery) [name], MD as Consulting Physician (Gastroenterology) System, Provider Not In [name], MD as Consulting Physician (Neurosurgery) [name], MD as Referring Physician (Ophthalmology) Charizma Gardiner X, NP as Nurse Practitioner (Internal Medicine) Almedia Balls, MD as Referring Physician (Orthopedic Surgery) [name], MD as Consulting Physician (Neurosurgery) Elsie Saas, MD as Consulting Physician (Orthopedic Surgery) Allyn Kenner, MD (Dermatology) Kathrynn Ducking, MD as Consulting Physician (Neurology) Franchot Gallo, MD as Consulting Physician (Urology)  Indicate any recent Medical Services you may have received from other than [facility] providers in the past year (date may be approximate).     Assessment:   This is a routine wellness examination for [facility].  Hearing/Vision screen No results found.  Dietary issues and exercise activities discussed: Current Exercise Habits: The patient does not participate in regular exercise at present, Exercise limited by: cardiac condition(s);orthopedic condition(s)   Goals Addressed             This Visit's Progress    Maintain Mobility and Function       Evidence-based guidance:  Emphasize the importance of physical  activity and aerobic exercise as included in treatment plan; assess barriers to adherence; consider patient's abilities and preferences.  Encourage gradual increase in activity or exercise instead of stopping if pain occurs.  Reinforce individual therapy exercise prescription, such as strengthening, stabilization and stretching programs.  Promote optimal body mechanics to stabilize the spine with lifting and functional activity.  Encourage activity and mobility modifications to facilitate optimal function, such as using a log roll for bed mobility or dressing from a seated position.  Reinforce individual adaptive equipment recommendations to limit excessive spinal movements, such as a Systems analyst.  Assess adequacy of sleep; encourage use of sleep hygiene techniques, such as bedtime routine; use of white noise; dark, cool bedroom; avoiding daytime naps, heavy meals or exercise before bedtime.  Promote positions and modification to optimize sleep and sexual activity; consider pillows or positioning devices to assist in maintaining neutral spine.  Explore options for applying ergonomic principles at work and home, such as frequent position changes, using ergonomically designed equipment and working at optimal height.  Promote modifications to increase comfort with driving such as lumbar support, optimizing seat and steering wheel position, using cruise control and taking frequent rest stops to stretch and walk.   Notes:        Depression Screen PHQ 2/9 Scores 07/28/2016  PHQ - 2 Score 0    Fall Risk Fall Risk  03/24/2019 02/08/2018 07/28/2016  Falls in the past  year? 0 Yes Yes  Number falls in past yr: 0 1 1  Injury with Fall? 0 No No  Risk for fall due to : - - History of fall(s);Impaired balance/gait;Impaired mobility  Follow up - - Falls evaluation completed    FALL RISK PREVENTION PERTAINING TO THE HOME:  Any stairs in or around the home? Yes  If so, are there any without  handrails? No  Home free of loose throw rugs in walkways, pet beds, electrical cords, etc? Yes  Adequate lighting in your home to reduce risk of falls? Yes   ASSISTIVE DEVICES UTILIZED TO PREVENT FALLS:  Life alert? No  Use of a cane, walker or w/c? Yes  Grab bars in the bathroom? Yes  Shower chair or bench in shower? Yes  Elevated toilet seat or a handicapped toilet? Yes   TIMED UP AND GO:  Was the test performed? Yes .  Length of time to ambulate 10 feet: 20 sec.   Gait unsteady with use of assistive device, provider informed and education provided.   Cognitive Function: MMSE - Mini Mental State Exam 07/30/2016  Orientation to time 5  Orientation to Place 4  Registration 3  Attention/ Calculation 2  Recall 3  Language- name 2 objects 2  Language- repeat 1  Language- follow 3 step command 1  Language- read & follow direction 1  Write a sentence 1  Copy design 1  Total score 24        Immunizations Immunization History  Administered Date(s) Administered   Influenza-Unspecified 06/11/2015, 06/11/2019, 06/19/2020, 06/27/2021   Moderna Sars-Covid-2 Vaccination 09/10/2019, 10/08/2019, 07/17/2020, 02/05/2021   PFIZER(Purple Top)SARS-COV-2 Vaccination 09/10/2019   Pneumococcal Conjugate-13 09/12/2014   Pneumococcal Polysaccharide-23 04/21/2011   Pneumococcal-Unspecified 04/21/2019   Td 04/22/2002   Tetanus 05/16/2020   Unspecified SARS-COV-2 Vaccination 05/28/2021   Zoster, Live 04/27/2012    TDAP status: Up to date  Flu Vaccine status: Up to date  Pneumococcal vaccine status: Up to date  Covid-19 vaccine status: Completed vaccines  Qualifies for Shingles Vaccine? Yes   Zostavax completed Yes   Shingrix Completed?: No.    Education has been provided regarding the importance of this vaccine. Patient has been advised to call insurance company to determine out of pocket expense if they have not yet received this vaccine. Advised may also receive vaccine at local  pharmacy or Health Dept. Verbalized acceptance and understanding.  Screening Tests Health Maintenance  Topic Date Due   Zoster Vaccines- Shingrix (1 of 2) Never done   TETANUS/TDAP  05/16/2030   Pneumonia Vaccine 59+ Years old  Completed   INFLUENZA VACCINE  Completed   COVID-19 Vaccine  Completed   HPV VACCINES  Aged Out    Health Maintenance  Health Maintenance Due  Topic Date Due   Zoster Vaccines- Shingrix (1 of 2) Never done    Colorectal cancer screening: No longer required.   Lung Cancer Screening: (Low Dose CT Chest recommended if Age 59-80 years, 30 pack-year currently smoking OR have quit w/in 15years.) does not qualify.    Additional Screening:  Hepatitis C Screening: does not qualify  Vision Screening: Recommended annual ophthalmology exams for early detection of glaucoma and other disorders of the eye. Is the patient up to date with their annual eye exam?  No  Who is the provider or what is the name of the office in which the patient attends annual eye exams? Will refer if desires If pt is not established with a provider, would they  like to be referred to a provider to establish care? No .   Dental Screening: Recommended annual dental exams for proper oral hygiene  Community Resource Referral / Chronic Care Management: CRR required this visit?  No   CCM required this visit?  No      Plan:     I have personally reviewed and noted the following in the patients chart:   Medical and social history Use of alcohol, tobacco or illicit drugs  Current medications and supplements including opioid prescriptions. Patient is not currently taking opioid prescriptions. Functional ability and status Nutritional status Physical activity Advanced directives List of other physicians Hospitalizations, surgeries, and ER visits in previous 12 months Vitals Screenings to include cognitive, depression, and falls Referrals and appointments  In addition, I have  reviewed and discussed with patient certain preventive protocols, quality metrics, and best practice recommendations. A written personalized care plan for preventive services as well as general preventive health recommendations were provided to patient.   Ophthalmology f/u, MMSE, Shingrix order provided.   Loran Auguste X Jsiah Menta, NP   09/10/2021

## 2021-09-25 DIAGNOSIS — R079 Chest pain, unspecified: Secondary | ICD-10-CM | POA: Diagnosis not present

## 2021-10-04 ENCOUNTER — Non-Acute Institutional Stay: Payer: Medicare PPO | Admitting: Nurse Practitioner

## 2021-10-04 DIAGNOSIS — E538 Deficiency of other specified B group vitamins: Secondary | ICD-10-CM

## 2021-10-04 DIAGNOSIS — E7800 Pure hypercholesterolemia, unspecified: Secondary | ICD-10-CM

## 2021-10-04 DIAGNOSIS — Z79899 Other long term (current) drug therapy: Secondary | ICD-10-CM | POA: Diagnosis not present

## 2021-10-04 DIAGNOSIS — F419 Anxiety disorder, unspecified: Secondary | ICD-10-CM

## 2021-10-04 DIAGNOSIS — E039 Hypothyroidism, unspecified: Secondary | ICD-10-CM

## 2021-10-04 DIAGNOSIS — M159 Polyosteoarthritis, unspecified: Secondary | ICD-10-CM | POA: Diagnosis not present

## 2021-10-04 DIAGNOSIS — G6289 Other specified polyneuropathies: Secondary | ICD-10-CM | POA: Diagnosis not present

## 2021-10-04 DIAGNOSIS — I872 Venous insufficiency (chronic) (peripheral): Secondary | ICD-10-CM

## 2021-10-04 DIAGNOSIS — E559 Vitamin D deficiency, unspecified: Secondary | ICD-10-CM

## 2021-10-04 DIAGNOSIS — R350 Frequency of micturition: Secondary | ICD-10-CM

## 2021-10-04 DIAGNOSIS — R454 Irritability and anger: Secondary | ICD-10-CM

## 2021-10-04 DIAGNOSIS — R600 Localized edema: Secondary | ICD-10-CM

## 2021-10-04 DIAGNOSIS — K5900 Constipation, unspecified: Secondary | ICD-10-CM

## 2021-10-04 DIAGNOSIS — K219 Gastro-esophageal reflux disease without esophagitis: Secondary | ICD-10-CM

## 2021-10-04 NOTE — Assessment & Plan Note (Signed)
Stable, takes Omeprazole,  Hgb 13.4 04/23/21

## 2021-10-04 NOTE — Assessment & Plan Note (Signed)
LDL 66 05/31/21, takes Atorvastatin.  

## 2021-10-04 NOTE — Assessment & Plan Note (Signed)
takes Senokot S,

## 2021-10-04 NOTE — Assessment & Plan Note (Signed)
Left knee OA, R shoulder, takes Mobic 15 mg qd since 05/01/20, X-ray R hip/knee, f/u Ortho

## 2021-10-04 NOTE — Progress Notes (Addendum)
Location:   [facility] Room Number: 449Q Place of Service:  [facility] (13) Provider: Lennie Odor Kieron Kantner NP  Mishaal Lansdale X, NP  Patient Care Team: Concetta Guion X, NP as PCP - General (Internal Medicine) [name], [name], MD as Consulting Physician (Orthopedic Surgery) [name], MD as Consulting Physician (Gastroenterology) System, Provider Not In [name], MD as Consulting Physician (Neurosurgery) [name], MD as Referring Physician (Ophthalmology) Kloe Oates X, NP as Nurse Practitioner (Internal Medicine) Almedia Balls, MD as Referring Physician (Orthopedic Surgery) [name], MD as Consulting Physician (Neurosurgery) Elsie Saas, MD as Consulting Physician (Orthopedic Surgery) Allyn Kenner, MD (Dermatology) Kathrynn Ducking, MD (Inactive) as Consulting Physician (Neurology) Franchot Gallo, MD as Consulting Physician (Urology)  Extended Emergency Contact Information Primary Emergency Contact: [name] Address: [address]          [place], [place] [address] [place] Phone: [phone] Relation: Spouse  Code Status: DNR Goals of care: Advanced Directive information Advanced Directives 09/06/2021  Does Patient Have a Medical Advance Directive? Yes  Type of Paramedic of [place];Living will;Out of facility DNR (pink MOST or yellow form)  Does patient want to make changes to medical advance directive? No - Patient declined  Copy of [place] in Chart? Yes - validated most recent copy scanned in chart (See row information)  Pre-existing out of facility DNR order (yellow form or pink MOST form) Pink MOST form placed in chart (order not valid for inpatient use)     Chief Complaint  Patient presents with   Acute Visit    Emotional outburst behaviors.     HPI:  Pt is a 88 y.o. male seen today for an acute visit for reported the patient's irritations:  aggression, argumentative, towards staff and residents. The patient stated his appetite is poor, he was noted to have lips involuntary movement.                Peripheral neuropathy, weakness in legs, w/c for mobility, on Gabapentin. Takes Vit B12. Had nerve conduction study 03/10/18             Anxiety, psych consult 08/09/20, the patient declined recommended Depakote.             Left knee OA, R shoulder, takes Mobic 15 mg qd since 05/01/20, X-ray R hip/knee, f/u Ortho             Urinary frequency, stable, on Tamsulosin. underwent Urology evaluation in the past.              Hypothyroidism, takes Levothyroxine, TSH 6.36 08/30/21             Edema BLE, takes Furosemide, Bun/creat 17/1.0 04/23/21             Constipation, takes Senokot S,             GERD, takes Omeprazole,  Hgb 13.4 04/23/21             Vit D deficiency, Vit D level 23, taking Vit D             Allergic rhinitis, takes Fluticasone nasal spray, Fexofenadine.              Vit B12 deficiency, takes Vit B12  Hyperlipidemia, LDL 66 05/31/21, takes Atorvastatin.   Past Medical History:  Diagnosis Date   Abnormal MRI, knee  Per [facility] New Patient Packet    Anemia    Anticoagulated    Anxiety    Arthritis    Cervical disc disorder    Per [facility] New Patient Packet    Complication of anesthesia    states "I was [place] several weeks after surgery"caused by tramadol   Constipation    Dysuria 07/28/2016   Edema 07/24/2016   Gait abnormality 07/25/2016   Hard of hearing    Head injury    As a result of a fall, Per [facility] New Patient Packet    Hip arthritis 07/21/2016   History of fall 07/25/2016   Hypercholesteremia    Hypertension    Lumbar spinal stenosis    Memory loss 07/28/2016   mild   Obesity    Per [facility] New Patient Packet    Osteoarthritis of right hip 07/24/2016   Peripheral neuropathy 03/10/2018   Sleep apnea    cpap   Spinal stenosis of lumbar region 05/28/2016   Urinary frequency     Venous thrombosis of leg 07/24/2016   right gastrocnemius   Vertigo    Wears glasses    Weight loss 04/08/2016   Past Surgical History:  Procedure Laterality Date   BACK SURGERY  10/17, 80's   COLONOSCOPY     COLONOSCOPY     Per [facility] New Patient Packet, Dr.Schooler    ESOPHAGOGASTRODUODENOSCOPY     LUMBAR LAMINECTOMY/DECOMPRESSION MICRODISCECTOMY Right 05/28/2016   Procedure: Right Lumbar Three-Four Microdiscectomy;  Surgeon: [name], MD;  Location: [place] [facility];  Service: Neurosurgery;  Laterality: Right;   TOTAL HIP ARTHROPLASTY Right 07/21/2016   Procedure: TOTAL HIP ARTHROPLASTY ANTERIOR APPROACH;  Surgeon: [name], MD;  Location: [place];  Service: Orthopedics;  Laterality: Right;    Allergies  Allergen Reactions   Tramadol Other (See Comments)    "does not eat" LOSS OF APPETITE    Allergies as of 10/04/2021       Reactions   Tramadol Other (See Comments)   "does not eat" LOSS OF APPETITE        Medication List        Accurate as of October 04, 2021 11:59 PM. If you have any questions, ask your nurse or doctor.          acetaminophen 500 MG tablet Commonly known as: TYLENOL Take 1,000 mg by mouth every 8 (eight) hours as needed.   aspirin EC 81 MG tablet Take 81 mg by mouth daily. 8am.   atorvastatin 20 MG tablet Commonly known as: LIPITOR Take 20 mg by mouth daily. 8pm.   B-12 1000 MCG Tabs Take 1 tablet by mouth every other day. 8am.   Biofreeze 4 % Gel Generic drug: Menthol (Topical Analgesic) Apply topically in the morning, at noon, in the evening, and at bedtime.   CoQ10 100 MG Caps Take 1 capsule by mouth daily. 8am.   diphenhydrAMINE 25 MG tablet Commonly known as: BENADRYL Take 25 mg by mouth as needed.   fexofenadine 180 MG tablet Commonly known as: ALLEGRA Take 180 mg by mouth daily as needed for allergies or rhinitis.   fluticasone 50 MCG/ACT nasal spray Commonly known as: FLONASE Place 2 sprays into both  nostrils as needed.   furosemide 20 MG tablet Commonly known as: LASIX Take 20 mg by mouth. Mon, Wed, Fri   gabapentin 100 MG capsule Commonly known as: NEURONTIN Take 100 mg by mouth at bedtime. 8pm.   Glucosamine 500 MG Caps  Take by mouth. Take 3 pills to equal 157m daily.   hydrocortisone 2.5 % cream Apply topically 2 (two) times daily as needed.   ketoconazole 2 % cream Commonly known as: NIZORAL Apply 1 application topically 2 (two) times daily as needed for irritation.   levothyroxine 50 MCG tablet Commonly known as: SYNTHROID Take 50 mcg by mouth daily before breakfast.   meclizine 25 MG tablet Commonly known as: ANTIVERT Take 25 mg by mouth 3 (three) times daily as needed for dizziness.   meloxicam 15 MG tablet Commonly known as: MOBIC Take 15 mg by mouth daily.   MUCINEX DM PO Take 600 mg by mouth 2 (two) times daily.   NAT-RUL PSYLLIUM SEED HUSKS PO Take 1 tablet by mouth as needed. For constipation.   nystatin powder Commonly known as: MYCOSTATIN/NYSTOP Apply 1 application topically every other day.   omeprazole 20 MG capsule Commonly known as: PRILOSEC Take 20 mg by mouth daily.   potassium chloride 10 MEQ tablet Commonly known as: KLOR-CON M Take 10 mEq by mouth daily.   sennosides-docusate sodium 8.6-50 MG tablet Commonly known as: SENOKOT-S Take 1 tablet by mouth daily.   sodium chloride 0.65 % Soln nasal spray Commonly known as: OCEAN Place 2 sprays into both nostrils as needed for congestion.   tamsulosin 0.4 MG Caps capsule Commonly known as: FLOMAX Take 0.4 mg by mouth at bedtime.        Review of Systems  Constitutional:  Positive for appetite change. Negative for fatigue and fever.  HENT:  Positive for hearing loss. Negative for congestion and voice change.   Eyes:  Negative for visual disturbance.  Respiratory:  Negative for cough and shortness of breath.   Cardiovascular:  Positive for leg swelling.  Gastrointestinal:   Negative for abdominal pain and constipation.  Genitourinary:  Positive for frequency. Negative for difficulty urinating and urgency.  Musculoskeletal:  Positive for arthralgias, back pain and gait problem.       R+L knees, R shoulder, s/p cervical spine+lumber spien surgeries. Mostly pain is in the R shoulder. ]  Skin:  Negative for color change.  Neurological:  Positive for weakness and numbness. Negative for speech difficulty.       Memory lapses. Tingling, numbness, weakness in legs. Involuntary lip movements.   Psychiatric/Behavioral:  Positive for behavioral problems and dysphoric mood. Negative for sleep disturbance. The patient is nervous/anxious.        Irritable, anxious, angry   Immunization History  Administered Date(s) Administered   Influenza-Unspecified 06/11/2015, 06/11/2019, 06/19/2020, 06/27/2021   Moderna Sars-Covid-2 Vaccination 09/10/2019, 10/08/2019, 07/17/2020, 02/05/2021   PFIZER(Purple Top)SARS-COV-2 Vaccination 09/10/2019   Pneumococcal Conjugate-13 09/12/2014   Pneumococcal Polysaccharide-23 04/21/2011   Pneumococcal-Unspecified 04/21/2019   Td 04/22/2002   Tetanus 05/16/2020   Unspecified SARS-COV-2 Vaccination 05/28/2021   Zoster, Live 04/27/2012   Pertinent  Health Maintenance Due  Topic Date Due   INFLUENZA VACCINE  Completed   Fall Risk 07/28/2016 02/08/2018 03/24/2019  Falls in the past year? Yes Yes 0  Was there an injury with Fall? No No 0  Fall Risk Category Calculator - - 0  Fall Risk Category - - Low  Patient Fall Risk Level - - Low fall risk  Patient at Risk for Falls Due to History of fall(s);Impaired balance/gait;Impaired mobility - -  Fall risk Follow up Falls evaluation completed - -   Functional Status Survey:    Vitals:   10/04/21 0953  BP: 114/68  Pulse: 77  Resp: 18  Temp: (!) 97.4 F (36.3 C)  SpO2: 93%   There is no height or weight on file to calculate BMI. Physical Exam Vitals and nursing note reviewed.   Constitutional:      Appearance: Normal appearance.  HENT:     Head: Normocephalic and atraumatic.     Mouth/Throat:     Mouth: Mucous membranes are moist.  Eyes:     Extraocular Movements: Extraocular movements intact.     Conjunctiva/sclera: Conjunctivae normal.     Pupils: Pupils are equal, round, and reactive to light.  Cardiovascular:     Rate and Rhythm: Normal rate and regular rhythm.     Heart sounds: No murmur heard. Pulmonary:     Effort: Pulmonary effort is normal.     Breath sounds: No rales.  Abdominal:     General: Bowel sounds are normal.     Palpations: Abdomen is soft.     Tenderness: There is no abdominal tenderness.  Musculoskeletal:        General: Tenderness present.     Cervical back: Normal range of motion and neck supple.     Right lower leg: Edema present.     Left lower leg: Edema present.     Comments: minimal edema BLE. R shoulder pain with ROM is chronic. Chronic R+L knee pain, knee support L knee. S/p R hips replacement. S/p cervical/lumbar spine surgeries.   Skin:    General: Skin is warm and dry.  Neurological:     General: No focal deficit present.     Mental Status: He is alert. Mental status is at baseline.     Motor: Weakness present.     Gait: Gait abnormal.     Comments: Moves slow, uses electric w/c for mobility,  lower body weakness, peripheral neuropathy in legs.   Psychiatric:     Comments: Oriented to person, place. Appears anxious     Labs reviewed: Recent Labs    11/06/20 0000 03/26/21 0000 04/23/21 0000  NA 143 141 140  K 4.0 4.1 4.6  CL 105 106 106  CO2 29* 29* 29*  BUN _0 CREATININE 1.0 0.9 1.0  CALCIUM 9.1 8.8 8.7   Recent Labs    03/26/21 0000 04/23/21 0000  AST 18 18  ALT 14 14  ALKPHOS 91 87  ALBUMIN 3.9 3.8   Recent Labs    03/26/21 0000 04/23/21 0000  WBC 4.3 4.6  NEUTROABS 2,786.00 2,870.00  HGB 13.5 13.4*  HCT 42 41  PLT 173 173   Lab Results  Component Value Date   TSH 6.36 (A)  08/30/2021   No results found for: HGBA1C Lab Results  Component Value Date   CHOL 114 05/31/2021   HDL 25 (A) 05/31/2021   LDLCALC 66 05/31/2021   TRIG 149 05/31/2021   CHOLHDL 5.1 (H) 03/31/2019    Significant Diagnostic Results in last 30 days:  No results found.  Assessment/Plan: Anxiety psych consult 08/09/20, the patient declined recommended Depakote. reported the patient's irritations: aggression, argumentative, towards staff and residents. The patient stated his appetite is poor, he was noted to have lips involuntary movement.  Update CBC/diff, CMP/eGFR, may consider psychotropic agent to stabilize mood.  10/04/21 Na 138, K 4.3, Bun 16, creat 0.93, eGFR 79, wbc 4.7, Hgb 13.5, plt 273, neutrophils 60 10/07/21 will try Sertraline 10m qd. 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 Takes Vit B12. Had nerve conduction study 03/10/18  Osteoarthritis, multiple sites Left knee OA, R shoulder, takes Mobic 15 mg qd since 05/01/20, X-ray R hip/knee, f/u Ortho  Urinary frequency stable, on Tamsulosin. underwent Urology evaluation in the past.   Hypothyroidism increased Levothyroxine 08/29/21, TSH 6.36 08/30/21, f/u TSH 12 weeks scheduled.   Edema of both lower legs due to peripheral venous insufficiency , takes Furosemide, Bun/creat 17/1.0 04/23/21  Constipation  takes Senokot S,  GERD (gastroesophageal reflux disease) Stable, takes Omeprazole,  Hgb 13.4 04/23/21  Vitamin D deficiency Vit D level 23, taking Vit D  Vitamin B12 deficiency takes Vit B12  Hypercholesteremia  LDL 66 05/31/21, takes Atorvastatin.     Family/ staff Communication: plan of care reviewed with the patient and charge nurse.   Labs/tests ordered:  CBC/diff, CMP/eGFR  Time spend 40 minutes.

## 2021-10-04 NOTE — Assessment & Plan Note (Addendum)
psych consult 08/09/20, the patient declined recommended Depakote. reported the patient's irritations: aggression, argumentative, towards staff and residents. The patient stated his appetite is poor, he was noted to have lips involuntary movement.  Update CBC/diff, CMP/eGFR, may consider psychotropic agent to stabilize mood.  10/04/21 Na 138, K 4.3, Bun 16, creat 0.93, eGFR 79, wbc 4.7, Hgb 13.5, plt 273, neutrophils 60 10/07/21 will try Sertraline 80m qd. F/u BMP 2 weeks, observe 10/22/21 Na 142, K 4.3, Bun 21, creat 0.98 eGFR 74

## 2021-10-04 NOTE — Assessment & Plan Note (Signed)
increased Levothyroxine 08/29/21, TSH 6.36 08/30/21, f/u TSH 12 weeks scheduled.

## 2021-10-04 NOTE — Assessment & Plan Note (Signed)
weakness in legs, w/c for mobility, on Gabapentin. Takes Vit B12. Had nerve conduction study 03/10/18 

## 2021-10-04 NOTE — Assessment & Plan Note (Signed)
stable, on Tamsulosin. underwent Urology evaluation in the past. 

## 2021-10-04 NOTE — Assessment & Plan Note (Addendum)
takes Vit B12 

## 2021-10-04 NOTE — Assessment & Plan Note (Signed)
Vit D level 23, taking Vit D 

## 2021-10-04 NOTE — Assessment & Plan Note (Signed)
,   takes Furosemide, Bun/creat 17/1.0 04/23/21

## 2021-10-07 ENCOUNTER — Encounter: Payer: Self-pay | Admitting: Nurse Practitioner

## 2021-10-22 DIAGNOSIS — I10 Essential (primary) hypertension: Secondary | ICD-10-CM | POA: Diagnosis not present

## 2021-10-22 LAB — BASIC METABOLIC PANEL
BUN: 21 (ref 4–21)
CO2: 29 — AB (ref 13–22)
Chloride: 107 (ref 99–108)
Creatinine: 1 (ref 0.6–1.3)
Glucose: 91
Potassium: 4.3 (ref 3.4–5.3)
Sodium: 142 (ref 137–147)

## 2021-10-22 LAB — COMPREHENSIVE METABOLIC PANEL: Calcium: 8.6 — AB (ref 8.7–10.7)

## 2021-11-01 DIAGNOSIS — F411 Generalized anxiety disorder: Secondary | ICD-10-CM | POA: Diagnosis not present

## 2021-11-07 ENCOUNTER — Encounter: Payer: Self-pay | Admitting: Nurse Practitioner

## 2021-11-07 ENCOUNTER — Non-Acute Institutional Stay: Payer: Medicare PPO | Admitting: Nurse Practitioner

## 2021-11-07 DIAGNOSIS — J309 Allergic rhinitis, unspecified: Secondary | ICD-10-CM

## 2021-11-07 DIAGNOSIS — G6289 Other specified polyneuropathies: Secondary | ICD-10-CM

## 2021-11-07 DIAGNOSIS — R413 Other amnesia: Secondary | ICD-10-CM

## 2021-11-07 DIAGNOSIS — K219 Gastro-esophageal reflux disease without esophagitis: Secondary | ICD-10-CM | POA: Diagnosis not present

## 2021-11-07 DIAGNOSIS — R350 Frequency of micturition: Secondary | ICD-10-CM

## 2021-11-07 DIAGNOSIS — F419 Anxiety disorder, unspecified: Secondary | ICD-10-CM

## 2021-11-07 DIAGNOSIS — E7800 Pure hypercholesterolemia, unspecified: Secondary | ICD-10-CM

## 2021-11-07 DIAGNOSIS — E538 Deficiency of other specified B group vitamins: Secondary | ICD-10-CM

## 2021-11-07 DIAGNOSIS — E039 Hypothyroidism, unspecified: Secondary | ICD-10-CM

## 2021-11-07 DIAGNOSIS — I872 Venous insufficiency (chronic) (peripheral): Secondary | ICD-10-CM

## 2021-11-07 DIAGNOSIS — K5900 Constipation, unspecified: Secondary | ICD-10-CM

## 2021-11-07 DIAGNOSIS — R600 Localized edema: Secondary | ICD-10-CM

## 2021-11-07 DIAGNOSIS — M159 Polyosteoarthritis, unspecified: Secondary | ICD-10-CM

## 2021-11-07 NOTE — Assessment & Plan Note (Signed)
takes Senokot S    

## 2021-11-07 NOTE — Progress Notes (Signed)
Location:   [place] Room Number: [place] of Service:  ALF (13) Provider:  [name] NP   Leanndra Pember X, NP  Patient Care Team: Jaeveon Ashland X, NP as PCP - General (Internal Medicine) [name], [name], MD as Consulting Physician (Orthopedic Surgery) [name], MD as Consulting Physician (Gastroenterology) System, Provider Not In [name], MD as Consulting Physician (Neurosurgery) [name], MD as Referring Physician (Ophthalmology) Shequilla Goodgame X, NP as Nurse Practitioner (Internal Medicine) Almedia Balls, MD as Referring Physician (Orthopedic Surgery) [name], MD as Consulting Physician (Neurosurgery) Elsie Saas, MD as Consulting Physician (Orthopedic Surgery) Allyn Kenner, MD (Dermatology) Kathrynn Ducking, MD (Inactive) as Consulting Physician (Neurology) Franchot Gallo, MD as Consulting Physician (Urology)  Extended Emergency Contact Information Primary Emergency Contact: [name] Address: [address]          [place]          [place], [place] [address] [place] Phone: [phone] Relation: Spouse  Code Status:  Full Code Managed Care Goals of care: Advanced Directive information Advanced Directives 11/07/2021  Does Patient Have a Medical Advance Directive? Yes  Type of Advance Directive Out of facility DNR (pink MOST or yellow form);Living will;Healthcare Power of Attorney  Does patient want to make changes to medical advance directive? No - Patient declined  Copy of [place] in Chart? Yes - validated most recent copy scanned in chart (See row information)  Pre-existing out of facility DNR order (yellow form or pink MOST form) Pink MOST form placed in chart (order not valid for inpatient use);Yellow form placed in chart (order not valid for inpatient use)     Chief Complaint  Patient presents with   Medical Management of Chronic Issues    HPI:  Pt is a 88  y.o. male seen today for medical management of chronic diseases.      Peripheral neuropathy, weakness in legs, w/c for mobility, on Gabapentin. Takes Vit B12. Had nerve conduction study 03/10/18             Anxiety/depression, stabilized on Sertraline. Na 142 10/22/21             Left knee OA, R shoulder, takes Mobic 15 mg qd since 05/01/20, X-ray R hip/knee, f/u Ortho             Urinary frequency, stable, on Tamsulosin. underwent Urology evaluation in the past.              Hypothyroidism, takes Levothyroxine, TSH 6.36 08/30/21             Edema BLE, takes Furosemide, Bun/creat 21/1.0 10/22/21             Constipation, takes Senokot S             GERD, takes Omeprazole,  Hgb 13.4 04/23/21             Vit D deficiency, Vit D level 23, taking Vit D             Allergic rhinitis, takes Fluticasone nasal spray, Fexofenadine.              Vit B12 deficiency, takes Vit B12             Hyperlipidemia, LDL 66 05/31/21, takes Atorvastatin.   Past Medical History:  Diagnosis Date   Abnormal MRI, knee    Per [facility] New Patient Packet    Anemia  Anticoagulated    Anxiety    Arthritis    Cervical disc disorder    Per [facility] New Patient Packet    Complication of anesthesia    states "I was in [place] for several weeks after surgery"caused by tramadol   Constipation    Dysuria 07/28/2016   Edema 07/24/2016   Gait abnormality 07/25/2016   Hard of hearing    Head injury    As a result of a fall, Per [facility] New Patient Packet    Hip arthritis 07/21/2016   History of fall 07/25/2016   Hypercholesteremia    Hypertension    Lumbar spinal stenosis    Memory loss 07/28/2016   mild   Obesity    Per [facility] New Patient Packet    Osteoarthritis of right hip 07/24/2016   Peripheral neuropathy 03/10/2018   Sleep apnea    cpap   Spinal stenosis of lumbar region 05/28/2016   Urinary frequency    Venous thrombosis of leg 07/24/2016   right gastrocnemius   Vertigo    Wears glasses    Weight loss 04/08/2016    Past Surgical History:  Procedure Laterality Date   BACK SURGERY  10/17, 80's   COLONOSCOPY     COLONOSCOPY     Per [facility] New Patient Packet, Dr.Schooler    ESOPHAGOGASTRODUODENOSCOPY     LUMBAR LAMINECTOMY/DECOMPRESSION MICRODISCECTOMY Right 05/28/2016   Procedure: Right Lumbar Three-Four Microdiscectomy;  Surgeon: [name], MD;  Location: [place] [facility];  Service: Neurosurgery;  Laterality: Right;   TOTAL HIP ARTHROPLASTY Right 07/21/2016   Procedure: TOTAL HIP ARTHROPLASTY ANTERIOR APPROACH;  Surgeon: [name], MD;  Location: [place];  Service: Orthopedics;  Laterality: Right;    Allergies  Allergen Reactions   Tramadol Other (See Comments)    "does not eat" LOSS OF APPETITE    Allergies as of 11/07/2021       Reactions   Tramadol Other (See Comments)   "does not eat" LOSS OF APPETITE        Medication List        Accurate as of November 07, 2021 11:59 PM. If you have any questions, ask your nurse or doctor.          acetaminophen 500 MG tablet Commonly known as: TYLENOL Take 1,000 mg by mouth every 8 (eight) hours as needed.   aspirin EC 81 MG tablet Take 81 mg by mouth daily. 8am.   atorvastatin 20 MG tablet Commonly known as: LIPITOR Take 20 mg by mouth daily. 8pm.   B-12 1000 MCG Tabs Take 1 tablet by mouth every other day. 8am.   Biofreeze 4 % Gel Generic drug: Menthol (Topical Analgesic) Apply topically in the morning, at noon, in the evening, and at bedtime.   CoQ10 100 MG Caps Take 1 capsule by mouth daily. 8am.   diphenhydrAMINE 25 MG tablet Commonly known as: BENADRYL Take 25 mg by mouth as needed.   fexofenadine 180 MG tablet Commonly known as: ALLEGRA Take 180 mg by mouth daily as needed for allergies or rhinitis.   fluticasone 50 MCG/ACT nasal spray Commonly known as: FLONASE Place 2 sprays into both nostrils as needed.   furosemide 20 MG tablet Commonly known as: LASIX Take 20 mg by mouth. Mon, Wed, Fri   gabapentin 100  MG capsule Commonly known as: NEURONTIN Take 100 mg by mouth at bedtime. 8pm.   Glucosamine 500 MG Caps Take by mouth. Take 3 pills to equal 1500mg  daily.  hydrocortisone 2.5 % cream Apply topically 2 (two) times daily as needed.   ketoconazole 2 % cream Commonly known as: NIZORAL Apply 1 application topically 2 (two) times daily as needed for irritation.   levothyroxine 75 MCG tablet Commonly known as: SYNTHROID Take 75 mcg by mouth daily before breakfast.   meclizine 25 MG tablet Commonly known as: ANTIVERT Take 25 mg by mouth 3 (three) times daily as needed for dizziness.   meloxicam 15 MG tablet Commonly known as: MOBIC Take 15 mg by mouth daily.   MUCINEX DM PO Take 600 mg by mouth 2 (two) times daily.   NAT-RUL PSYLLIUM SEED HUSKS PO Take 1 tablet by mouth as needed. For constipation.   nystatin powder Commonly known as: MYCOSTATIN/NYSTOP Apply 1 application topically every other day.   omeprazole 20 MG capsule Commonly known as: PRILOSEC Take 20 mg by mouth daily.   potassium chloride 10 MEQ tablet Commonly known as: KLOR-CON M Take 10 mEq by mouth daily.   sennosides-docusate sodium 8.6-50 MG tablet Commonly known as: SENOKOT-S Take 1 tablet by mouth daily.   sertraline 25 MG tablet Commonly known as: ZOLOFT Take 25 mg by mouth daily.   sodium chloride 0.65 % Soln nasal spray Commonly known as: OCEAN Place 2 sprays into both nostrils as needed for congestion.   tamsulosin 0.4 MG Caps capsule Commonly known as: FLOMAX Take 0.4 mg by mouth at bedtime.        Review of Systems  Constitutional:  Negative for fatigue and fever.  HENT:  Positive for hearing loss. Negative for congestion and voice change.   Eyes:  Negative for visual disturbance.  Respiratory:  Negative for cough and shortness of breath.   Cardiovascular:  Positive for leg swelling.  Gastrointestinal:  Negative for abdominal pain and constipation.  Genitourinary:  Positive for  frequency. Negative for difficulty urinating and urgency.  Musculoskeletal:  Positive for arthralgias, back pain and gait problem.       R+L knees, R shoulder, s/p cervical spine+lumber spien surgeries. Mostly pain is in the R shoulder. ]  Skin:  Negative for color change.  Neurological:  Positive for weakness and numbness. Negative for speech difficulty.       Memory lapses. Tingling, numbness, weakness in legs. Involuntary lip movements.   Psychiatric/Behavioral:  Positive for behavioral problems and dysphoric mood. Negative for sleep disturbance. The patient is nervous/anxious.        Irritable, anxious, angry improved.    Immunization History  Administered Date(s) Administered   Influenza-Unspecified 06/11/2015, 06/11/2019, 06/19/2020, 06/27/2021   Moderna Sars-Covid-2 Vaccination 09/10/2019, 10/08/2019, 07/17/2020, 02/05/2021   PFIZER(Purple Top)SARS-COV-2 Vaccination 09/10/2019   Pneumococcal Conjugate-13 09/12/2014   Pneumococcal Polysaccharide-23 04/21/2011   Pneumococcal-Unspecified 04/21/2019   Td 04/22/2002   Tetanus 05/16/2020   Unspecified SARS-COV-2 Vaccination 05/28/2021   Zoster, Live 04/27/2012   Pertinent  Health Maintenance Due  Topic Date Due   INFLUENZA VACCINE  Completed   Fall Risk 07/28/2016 02/08/2018 03/24/2019  Falls in the past year? Yes Yes 0  Was there an injury with Fall? No No 0  Fall Risk Category Calculator - - 0  Fall Risk Category - - Low  Patient Fall Risk Level - - Low fall risk  Patient at Risk for Falls Due to History of fall(s);Impaired balance/gait;Impaired mobility - -  Fall risk Follow up Falls evaluation completed - -   Functional Status Survey:    Vitals:   11/07/21 1119  Pulse: 60  Temp: 98.4 F (36.9  C)  SpO2: 97%  Weight: 212 lb 12.8 oz (96.5 kg)  Height: 5\' 4"  (1.626 m)   Body mass index is 36.53 kg/m. Physical Exam Vitals and nursing note reviewed.  Constitutional:      Appearance: Normal appearance.  HENT:      Head: Normocephalic and atraumatic.     Mouth/Throat:     Mouth: Mucous membranes are moist.  Eyes:     Extraocular Movements: Extraocular movements intact.     Conjunctiva/sclera: Conjunctivae normal.     Pupils: Pupils are equal, round, and reactive to light.  Cardiovascular:     Rate and Rhythm: Normal rate and regular rhythm.     Heart sounds: No murmur heard. Pulmonary:     Effort: Pulmonary effort is normal.     Breath sounds: No rales.  Abdominal:     General: Bowel sounds are normal.     Palpations: Abdomen is soft.     Tenderness: There is no abdominal tenderness.  Musculoskeletal:        General: Tenderness present.     Cervical back: Normal range of motion and neck supple.     Right lower leg: Edema present.     Left lower leg: Edema present.     Comments: minimal edema BLE. R shoulder pain with ROM is chronic. Chronic R+L knee pain, knee support L knee. S/p R hips replacement. S/p cervical/lumbar spine surgeries.   Skin:    General: Skin is warm and dry.  Neurological:     General: No focal deficit present.     Mental Status: He is alert. Mental status is at baseline.     Motor: Weakness present.     Gait: Gait abnormal.     Comments: Moves slow, uses electric w/c for mobility,  lower body weakness, peripheral neuropathy in legs.   Psychiatric:        Mood and Affect: Mood normal.        Behavior: Behavior normal.    Labs reviewed: Recent Labs    03/26/21 0000 04/23/21 0000 10/22/21 0000  NA 141 140 142  K 4.1 4.6 4.3  CL 106 106 107  CO2 29* 29* 29*  BUN 16 17 21   CREATININE 0.9 1.0 1.0  CALCIUM 8.8 8.7 8.6*   Recent Labs    03/26/21 0000 04/23/21 0000  AST 18 18  ALT 14 14  ALKPHOS 91 87  ALBUMIN 3.9 3.8   Recent Labs    03/26/21 0000 04/23/21 0000  WBC 4.3 4.6  NEUTROABS 2,786.00 2,870.00  HGB 13.5 13.4*  HCT 42 41  PLT 173 173   Lab Results  Component Value Date   TSH 6.36 (A) 08/30/2021   No results found for: HGBA1C Lab  Results  Component Value Date   CHOL 114 05/31/2021   HDL 25 (A) 05/31/2021   LDLCALC 66 05/31/2021   TRIG 149 05/31/2021   CHOLHDL 5.1 (H) 03/31/2019    Significant Diagnostic Results in last 30 days:  No results found.  Assessment/Plan Hypercholesteremia  LDL 66 05/31/21, takes Atorvastatin.   Vitamin B12 deficiency  takes Vit B12  Allergic rhinitis  takes Fluticasone nasal spray, Fexofenadine.   GERD (gastroesophageal reflux disease) takes Omeprazole,  Hgb 13.4 04/23/21  Constipation takes Senokot S  Edema of both lower legs due to peripheral venous insufficiency  takes Furosemide, Bun/creat 21/1.0 10/22/21  Hypothyroidism takes Levothyroxine, TSH 6.36 08/30/21  Urinary frequency stable, on Tamsulosin. underwent Urology evaluation in the past.   Osteoarthritis,  multiple sites Left knee OA, R shoulder, takes Mobic 15 mg qd since 05/01/20, X-ray R hip/knee, f/u Ortho  Anxiety  stabilized on Sertraline. 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 Takes Vit B12. Had nerve conduction study 03/10/18     Family/ staff Communication: plan of care reviewed with the patient and charge nurse.   Labs/tests ordered:  none  Time spend 40 minutes.

## 2021-11-07 NOTE — Assessment & Plan Note (Signed)
stable, on Tamsulosin. underwent Urology evaluation in the past. 

## 2021-11-07 NOTE — Assessment & Plan Note (Signed)
LDL 66 05/31/21, takes Atorvastatin.  

## 2021-11-07 NOTE — Assessment & Plan Note (Signed)
takes Omeprazole,  Hgb 13.4 04/23/21 ?

## 2021-11-07 NOTE — Assessment & Plan Note (Signed)
Left knee OA, R shoulder, takes Mobic 15 mg qd since 05/01/20, X-ray R hip/knee, f/u Ortho ?

## 2021-11-07 NOTE — Assessment & Plan Note (Signed)
takes Furosemide, Bun/creat 21/1.0 10/22/21 ?

## 2021-11-07 NOTE — Assessment & Plan Note (Signed)
weakness in legs, w/c for mobility, on Gabapentin. Takes Vit B12. Had nerve conduction study 03/10/18 

## 2021-11-07 NOTE — Assessment & Plan Note (Signed)
takes Fluticasone nasal spray, Fexofenadine.  

## 2021-11-07 NOTE — Assessment & Plan Note (Signed)
takes Levothyroxine, TSH 6.36 08/30/21 ?

## 2021-11-07 NOTE — Assessment & Plan Note (Signed)
takes Vit B12 

## 2021-11-07 NOTE — Assessment & Plan Note (Signed)
stabilized on Sertraline. Na 142 10/22/21 ?

## 2021-11-11 ENCOUNTER — Encounter: Payer: Self-pay | Admitting: Nurse Practitioner

## 2021-11-21 DIAGNOSIS — R946 Abnormal results of thyroid function studies: Secondary | ICD-10-CM | POA: Diagnosis not present

## 2021-11-21 DIAGNOSIS — I10 Essential (primary) hypertension: Secondary | ICD-10-CM | POA: Diagnosis not present

## 2021-11-22 LAB — TSH: TSH: 2.89 (ref 0.41–5.90)

## 2022-01-03 NOTE — Assessment & Plan Note (Signed)
01/03/22 MMSE 17/30, may consider memory preserving meds. Namenda starter pk then '10mg'$  bid po.  ?

## 2022-01-09 ENCOUNTER — Encounter: Payer: Self-pay | Admitting: Internal Medicine

## 2022-01-09 ENCOUNTER — Non-Acute Institutional Stay: Payer: Medicare PPO | Admitting: Internal Medicine

## 2022-01-09 DIAGNOSIS — F32A Depression, unspecified: Secondary | ICD-10-CM | POA: Diagnosis not present

## 2022-01-09 DIAGNOSIS — F03B11 Unspecified dementia, moderate, with agitation: Secondary | ICD-10-CM | POA: Diagnosis not present

## 2022-01-09 DIAGNOSIS — R600 Localized edema: Secondary | ICD-10-CM

## 2022-01-09 DIAGNOSIS — I872 Venous insufficiency (chronic) (peripheral): Secondary | ICD-10-CM

## 2022-01-09 DIAGNOSIS — R454 Irritability and anger: Secondary | ICD-10-CM

## 2022-01-09 NOTE — Progress Notes (Signed)
?Location:   [facility] ?Nursing Home Room Number: 923 ?Place of Service:  ALF (13) ?Provider:  Veleta Miners MD ? ?Mast, Man X, NP ? ?Patient Care Team: ?Mast, Man X, NP as PCP - General (Internal Medicine) ?[name], MD as Consulting Physician (Orthopedic Surgery) ?[name], MD as Consulting Physician (Gastroenterology) ?System, Provider Not In ?[name], MD as Consulting Physician (Neurosurgery) ?[name], MD as Referring Physician (Ophthalmology) ?Mast, Man X, NP as Nurse Practitioner (Internal Medicine) ?Almedia Balls, MD as Referring Physician (Orthopedic Surgery) ?[name], MD as Consulting Physician (Neurosurgery) ?Elsie Saas, MD as Consulting Physician (Orthopedic Surgery) ?Allyn Kenner, MD (Dermatology) ?Kathrynn Ducking, MD (Inactive) as Consulting Physician (Neurology) ?Franchot Gallo, MD as Consulting Physician (Urology) ? ?Extended Emergency Contact Information ?Primary Emergency Contact: Gueye,Linda K ?Address: [address] ?         [address] ?         [place], [place] [address] [place] ?Mobile Phone: [phone] ?Relation: Spouse ? ?Code Status:  Full Code Managed Care ?Goals of care: Advanced Directive information ? ?  11/07/2021  ? 11:36 AM  ?Advanced Directives  ?Does Patient Have a Medical Advance Directive? Yes  ?Type of Advance Directive Out of facility DNR (pink MOST or yellow form);Living will;Healthcare Power of Attorney  ?Does patient want to make changes to medical advance directive? No - Patient declined  ?Copy of [place] in Chart? Yes - validated most recent copy scanned in chart (See row information)  ?Pre-existing out of facility DNR order (yellow form or pink MOST form) Pink MOST form placed in chart (order not valid for inpatient use);Yellow form placed in chart (order not valid for inpatient use)  ? ? ? ?Chief Complaint  ?Patient presents with  ? Acute Visit  ?  Behaviors  ? ? ?HPI:  ?Pt  is a 88 y.o. male seen today for an acute visit for Behaviors and Depression ? ?Patient has h/o Peripheral Neuropathy Idiopathic uses wheel chair ?BPH with urinary Frequency, ? LE edema,  ?Arthritis, Sleep Apnea, Hyperlipidemia ?Cognitive impairment ?H/o BPPV ? ?Lives in [place] with his wife ?Patient recently has been having a lot of behavior issues.  Shouting at the staff shouting at other residents even shouting at his own wife ?His wife in the room agreed with Nurses assessment ?Patient also said he is depressed  ?His Legs were also more then usual swollen ?Wt Readings from Last 3 Encounters:  ?01/09/22 198 lb (89.8 kg)  ?11/07/21 212 lb 12.8 oz (96.5 kg)  ?09/06/21 218 lb 14.4 oz (99.3 kg)  ?  ?It seem he is also loosing weight  ?Was also started on Namenda for MMSE of 17/30 ? ?Past Medical History:  ?Diagnosis Date  ? Abnormal MRI, knee   ? Per [facility] New Patient Packet   ? Anemia   ? Anticoagulated   ? Anxiety   ? Arthritis   ? Cervical disc disorder   ? Per [facility] New Patient Packet   ? Complication of anesthesia   ? states "I was in [place] for several weeks after surgery"caused by tramadol  ? Constipation   ? Dysuria 07/28/2016  ? Edema 07/24/2016  ? Gait abnormality 07/25/2016  ? Hard of hearing   ? Head injury   ? As a result of a fall, Per [facility] New Patient Packet   ? Hip arthritis 07/21/2016  ? History of fall 07/25/2016  ? Hypercholesteremia   ? Hypertension   ?  Lumbar spinal stenosis   ? Memory loss 07/28/2016  ? mild  ? Obesity   ? Per [facility] New Patient Packet   ? Osteoarthritis of right hip 07/24/2016  ? Peripheral neuropathy 03/10/2018  ? Sleep apnea   ? cpap  ? Spinal stenosis of lumbar region 05/28/2016  ? Urinary frequency   ? Venous thrombosis of leg 07/24/2016  ? right gastrocnemius  ? Vertigo   ? Wears glasses   ? Weight loss 04/08/2016  ? ?Past Surgical History:  ?Procedure Laterality Date  ? BACK SURGERY  10/17, 80's  ? COLONOSCOPY    ? COLONOSCOPY    ? Per [facility] New Patient Packet, [place]   ?  ESOPHAGOGASTRODUODENOSCOPY    ? LUMBAR LAMINECTOMY/DECOMPRESSION MICRODISCECTOMY Right 05/28/2016  ? Procedure: Right Lumbar Three-Four Microdiscectomy;  Surgeon: [name], MD;  Location: [place] [facility];  Service: Neurosurgery;  Laterality: Right;  ? TOTAL HIP ARTHROPLASTY Right 07/21/2016  ? Procedure: TOTAL HIP ARTHROPLASTY ANTERIOR APPROACH;  Surgeon: [name], MD;  Location: [place];  Service: Orthopedics;  Laterality: Right;  ? ? ?Allergies  ?Allergen Reactions  ? Tramadol Other (See Comments)  ?  "does not eat" ?LOSS OF APPETITE  ? ? ?Allergies as of 01/09/2022   ? ?   Reactions  ? Tramadol Other (See Comments)  ? "does not eat" ?LOSS OF APPETITE  ? ?  ? ?  ?Medication List  ?  ? ?  ? Accurate as of Jan 09, 2022  3:11 PM. If you have any questions, ask your nurse or doctor.  ?  ?  ? ?  ? ?STOP taking these medications   ? ?diphenhydrAMINE 25 MG tablet ?Commonly known as: BENADRYL ?Stopped by: Virgie Dad, MD ?  ?MUCINEX DM PO ?Stopped by: Virgie Dad, MD ?  ?sodium chloride 0.65 % Soln nasal spray ?Commonly known as: OCEAN ?Stopped by: Virgie Dad, MD ?  ? ?  ? ?TAKE these medications   ? ?acetaminophen 500 MG tablet ?Commonly known as: TYLENOL ?Take 1,000 mg by mouth every 8 (eight) hours as needed. ?  ?aspirin EC 81 MG tablet ?Take 81 mg by mouth daily. 8am. ?  ?atorvastatin 20 MG tablet ?Commonly known as: LIPITOR ?Take 20 mg by mouth daily. 8pm. ?  ?B-12 1000 MCG Tabs ?Take 1 tablet by mouth every other day. 8am. ?  ?Biofreeze 4 % Gel ?Generic drug: Menthol (Topical Analgesic) ?Apply topically in the morning, at noon, in the evening, and at bedtime. ?  ?CoQ10 100 MG Caps ?Take 1 capsule by mouth daily. 8am. ?  ?fexofenadine 180 MG tablet ?Commonly known as: ALLEGRA ?Take 180 mg by mouth daily as needed for allergies or rhinitis. ?  ?fluticasone 50 MCG/ACT nasal spray ?Commonly known as: FLONASE ?Place 2 sprays into both nostrils as needed. ?  ?furosemide 20 MG tablet ?Commonly known as:  LASIX ?Take 20 mg by mouth daily. ?  ?gabapentin 100 MG capsule ?Commonly known as: NEURONTIN ?Take 100 mg by mouth at bedtime. 8pm. ?  ?Glucosamine 500 MG Caps ?Take by mouth. Take 3 pills to equal '1500mg'$  daily. ?  ?hydrocortisone 2.5 % cream ?Apply topically 2 (two) times daily as needed. ?  ?hydrOXYzine 25 MG capsule ?Commonly known as: VISTARIL ?Take 25 mg by mouth 2 (two) times daily. ?  ?ketoconazole 2 % cream ?Commonly known as: NIZORAL ?Apply 1 application topically 2 (two) times daily as needed for irritation. ?  ?levothyroxine 75 MCG tablet ?Commonly known as: SYNTHROID ?  Take 75 mcg by mouth daily before breakfast. ?  ?meclizine 25 MG tablet ?Commonly known as: ANTIVERT ?Take 25 mg by mouth 3 (three) times daily as needed for dizziness. ?  ?meloxicam 15 MG tablet ?Commonly known as: MOBIC ?Take 15 mg by mouth daily. ?  ?memantine tablet pack ?Commonly known as: [place] ?Take by mouth See admin instructions. 5 mg/day for =1 week; 5 mg twice daily for =1 week; 15 mg/day given in 5 mg and 10 mg separated doses for =1 week; then 10 mg twice daily ?  ?NAT-RUL PSYLLIUM SEED HUSKS PO ?Take 1 tablet by mouth as needed. For constipation. ?  ?nystatin powder ?Commonly known as: MYCOSTATIN/NYSTOP ?Apply 1 application topically every other day. ?  ?omeprazole 20 MG capsule ?Commonly known as: PRILOSEC ?Take 20 mg by mouth daily. ?  ?potassium chloride 10 MEQ tablet ?Commonly known as: KLOR-CON M ?Take 10 mEq by mouth daily. ?  ?sennosides-docusate sodium 8.6-50 MG tablet ?Commonly known as: SENOKOT-S ?Take 1 tablet by mouth daily. ?  ?sertraline 25 MG tablet ?Commonly known as: ZOLOFT ?Take 25 mg by mouth daily. ?  ?tamsulosin 0.4 MG Caps capsule ?Commonly known as: FLOMAX ?Take 0.4 mg by mouth at bedtime. ?  ? ?  ? ? ?Review of Systems  ?Constitutional:  Positive for unexpected weight change. Negative for activity change and appetite change.  ?HENT: Negative.    ?Respiratory:  Negative for cough and  shortness of breath.   ?Cardiovascular:  Positive for leg swelling.  ?Gastrointestinal:  Negative for constipation.  ?Genitourinary:  Positive for frequency.  ?Musculoskeletal:  Positive for gait problem. Negative for

## 2022-01-16 DIAGNOSIS — I10 Essential (primary) hypertension: Secondary | ICD-10-CM | POA: Diagnosis not present

## 2022-01-16 LAB — BASIC METABOLIC PANEL
BUN: 26 — AB (ref 4–21)
CO2: 29 — AB (ref 13–22)
Chloride: 105 (ref 99–108)
Creatinine: 1.1 (ref 0.6–1.3)
Glucose: 87
Potassium: 4.1 mEq/L (ref 3.5–5.1)
Sodium: 144 (ref 137–147)

## 2022-01-16 LAB — COMPREHENSIVE METABOLIC PANEL: Calcium: 8.6 — AB (ref 8.7–10.7)

## 2022-01-20 ENCOUNTER — Non-Acute Institutional Stay: Payer: Medicare PPO | Admitting: Orthopedic Surgery

## 2022-01-20 ENCOUNTER — Encounter: Payer: Self-pay | Admitting: Orthopedic Surgery

## 2022-01-20 DIAGNOSIS — M25561 Pain in right knee: Secondary | ICD-10-CM

## 2022-01-20 DIAGNOSIS — F03B11 Unspecified dementia, moderate, with agitation: Secondary | ICD-10-CM

## 2022-01-20 DIAGNOSIS — R531 Weakness: Secondary | ICD-10-CM

## 2022-01-20 DIAGNOSIS — F32A Depression, unspecified: Secondary | ICD-10-CM

## 2022-01-20 DIAGNOSIS — M25562 Pain in left knee: Secondary | ICD-10-CM

## 2022-01-20 DIAGNOSIS — G8929 Other chronic pain: Secondary | ICD-10-CM

## 2022-01-20 DIAGNOSIS — R634 Abnormal weight loss: Secondary | ICD-10-CM | POA: Diagnosis not present

## 2022-01-20 NOTE — Progress Notes (Signed)
?Location:  [facility] ?Nursing Home Room Number: AL903/B ?Place of Service:  ALF (13) ?Provider: Yvonna Alanis, NP ? ?Patient Care Team: ?Mast, Man X, NP as PCP - General (Internal Medicine) ?[name], MD as Consulting Physician (Orthopedic Surgery) ?[name], MD as Consulting Physician (Gastroenterology) ?System, Provider Not In ?[name], MD as Consulting Physician (Neurosurgery) ?[name], MD as Referring Physician (Ophthalmology) ?Mast, Man X, NP as Nurse Practitioner (Internal Medicine) ?Almedia Balls, MD as Referring Physician (Orthopedic Surgery) ?[name], MD as Consulting Physician (Neurosurgery) ?Elsie Saas, MD as Consulting Physician (Orthopedic Surgery) ?Allyn Kenner, MD (Dermatology) ?Kathrynn Ducking, MD (Inactive) as Consulting Physician (Neurology) ?Franchot Gallo, MD as Consulting Physician (Urology) ? ?Extended Emergency Contact Information ?Primary Emergency Contact: Epling,Linda K ?Address: [address] ?         [address] ?         [place], [place] [address] [place] ?Mobile Phone: [phone] ?Relation: Spouse ? ?Code Status:  Full code ?Goals of care: Advanced Directive information ? ?  01/20/2022  ?  1:43 PM  ?Advanced Directives  ?Does Patient Have a Medical Advance Directive? Yes  ?Type of Paramedic of [place];Living will  ?Does patient want to make changes to medical advance directive? No - Patient declined  ?Copy of [place] in Chart? Yes - validated most recent copy scanned in chart (See row information)  ? ? ? ?Chief Complaint  ?Patient presents with  ? Acute Visit  ?  Knee pain  ? ? ?HPI:  ?Pt is a 88 y.o. male seen today for an acute visit for bilateral knee pain.  ? ?Bilateral knee pain- L>R, seen by Dr. [name] in the past and contemplated having knee replacement, he reports increased knee pain today, he has not been getting OOB as often due to pain, h/o neuropathy,  remains on tylenol prn/ mobic/ gabapentin and Biofreeze ?Weakness- see above, requiring extra assistance with toileting, reports feeling weaker ?Weight loss- see trends below, wife reports he is eating less ?Dementia- CT head revealed mild atrophy with small vessel ischemic changes in 2018, increased behaviors of shouting at residents/staff/wife, recently started Namenda ?Depression- observed more in bed and sleeping more, remains on Zoloft- recently increased due to behaviors   ? ?No recent falls or injuries. PWC not working at this time.  ? ?Recent weights: ? 05/01- 198 lbs ? 04/01- 195.2 lbs ? 03/01- 212.8 lbs ? 02/01- 219.6 lbs ? ? ? ?Past Medical History:  ?Diagnosis Date  ? Abnormal MRI, knee   ? Per [facility] New Patient Packet   ? Anemia   ? Anticoagulated   ? Anxiety   ? Arthritis   ? Cervical disc disorder   ? Per [facility] New Patient Packet   ? Complication of anesthesia   ? states "I was in [place] for several weeks after surgery"caused by tramadol  ? Constipation   ? Dysuria 07/28/2016  ? Edema 07/24/2016  ? Gait abnormality 07/25/2016  ? Hard of hearing   ? Head injury   ? As a result of a fall, Per [facility] New Patient Packet   ? Hip arthritis 07/21/2016  ? History of fall 07/25/2016  ? Hypercholesteremia   ? Hypertension   ? Lumbar spinal stenosis   ? Memory loss 07/28/2016  ? mild  ? Obesity   ? Per [facility] New Patient Packet   ? Osteoarthritis of right hip 07/24/2016  ? Peripheral neuropathy 03/10/2018  ?  Sleep apnea   ? cpap  ? Spinal stenosis of lumbar region 05/28/2016  ? Urinary frequency   ? Venous thrombosis of leg 07/24/2016  ? right gastrocnemius  ? Vertigo   ? Wears glasses   ? Weight loss 04/08/2016  ? ?Past Surgical History:  ?Procedure Laterality Date  ? BACK SURGERY  10/17, 80's  ? COLONOSCOPY    ? COLONOSCOPY    ? Per [facility] New Patient Packet, [place]   ? ESOPHAGOGASTRODUODENOSCOPY    ? LUMBAR LAMINECTOMY/DECOMPRESSION MICRODISCECTOMY Right 05/28/2016  ? Procedure: Right Lumbar Three-Four  Microdiscectomy;  Surgeon: [name], MD;  Location: [facility];  Service: Neurosurgery;  Laterality: Right;  ? TOTAL HIP ARTHROPLASTY Right 07/21/2016  ? Procedure: TOTAL HIP ARTHROPLASTY ANTERIOR APPROACH;  Surgeon: [name], MD;  Location: [facility];  Service: Orthopedics;  Laterality: Right;  ? ? ?Allergies  ?Allergen Reactions  ? Tramadol Other (See Comments)  ?  "does not eat" ?LOSS OF APPETITE  ? ? ?Outpatient Encounter Medications as of 01/20/2022  ?Medication Sig  ? acetaminophen (TYLENOL) 500 MG tablet Take 1,000 mg by mouth every 8 (eight) hours as needed.  ? aspirin EC 81 MG tablet Take 81 mg by mouth daily. 8am.  ? atorvastatin (LIPITOR) 20 MG tablet Take 20 mg by mouth daily. 8pm.  ? Coenzyme Q10 (COQ10) 100 MG CAPS Take 1 capsule by mouth daily. 8am.  ? Cyanocobalamin (B-12) 1000 MCG TABS Take 1 tablet by mouth every other day. 8am.  ? fexofenadine (ALLEGRA) 180 MG tablet Take 180 mg by mouth daily as needed for allergies or rhinitis.  ? fluticasone (FLONASE) 50 MCG/ACT nasal spray Place 2 sprays into both nostrils as needed.   ? furosemide (LASIX) 20 MG tablet Take 20 mg by mouth daily.  ? gabapentin (NEURONTIN) 100 MG capsule Take 100 mg by mouth at bedtime. 8pm.  ? Glucosamine 500 MG CAPS Take by mouth. Take 3 pills to equal '1500mg'$  daily.  ? hydrocortisone 2.5 % cream Apply topically 2 (two) times daily as needed.  ? hydrOXYzine (VISTARIL) 25 MG capsule Take 25 mg by mouth 2 (two) times daily.  ? levothyroxine (SYNTHROID) 75 MCG tablet Take 75 mcg by mouth daily before breakfast.  ? meclizine (ANTIVERT) 25 MG tablet Take 25 mg by mouth 3 (three) times daily as needed for dizziness.  ? meloxicam (MOBIC) 15 MG tablet Take 15 mg by mouth daily.   ? memantine (NAMENDA TITRATION PACK) tablet pack Take by mouth See admin instructions. 5 mg/day for =1 week; 5 mg twice daily for =1 week; 15 mg/day given in 5 mg and 10 mg separated doses for =1 week; then 10 mg twice daily  ? Menthol, Topical  Analgesic, (BIOFREEZE) 4 % GEL Apply topically in the morning, at noon, in the evening, and at bedtime.  ? NAT-RUL PSYLLIUM SEED HUSKS PO Take 1 tablet by mouth daily. For constipation  ? nystatin (MYCOSTATIN/NYSTOP) powder Apply 1 application topically every other day.  ? omeprazole (PRILOSEC) 20 MG capsule Take 20 mg by mouth daily.  ? potassium chloride (KLOR-CON) 10 MEQ tablet Take 10 mEq by mouth daily.  ? sennosides-docusate sodium (SENOKOT-S) 8.6-50 MG tablet Take 1 tablet by mouth daily.  ? sertraline (ZOLOFT) 25 MG tablet Take 25 mg by mouth daily.  ? sertraline (ZOLOFT) 50 MG tablet Take 50 mg by mouth daily. Administer along with '25mg'$  tab to equal '75mg'$  daily  ? tamsulosin (FLOMAX) 0.4 MG CAPS capsule Take 0.4 mg by mouth  at bedtime.  ? [DISCONTINUED] ketoconazole (NIZORAL) 2 % cream Apply 1 application topically 2 (two) times daily as needed for irritation.  ? ?No facility-administered encounter medications on file as of 01/20/2022.  ? ? ?Review of Systems  ?Unable to perform ROS: Dementia  ? ?Immunization History  ?Administered Date(s) Administered  ? Influenza-Unspecified 06/11/2015, 06/11/2019, 06/19/2020, 06/27/2021  ? Moderna Sars-Covid-2 Vaccination 09/10/2019, 10/08/2019, 07/17/2020, 02/05/2021  ? PFIZER(Purple Top)SARS-COV-2 Vaccination 09/10/2019  ? Pneumococcal Conjugate-13 09/12/2014  ? Pneumococcal Polysaccharide-23 04/21/2011  ? Pneumococcal-Unspecified 04/21/2019  ? Td 04/22/2002  ? Tetanus 05/16/2020  ? Unspecified SARS-COV-2 Vaccination 05/28/2021  ? Zoster Recombinat (Shingrix) 09/14/2021, 11/25/2021  ? Zoster, Live 04/27/2012  ? ?Pertinent  Health Maintenance Due  ?Topic Date Due  ? INFLUENZA VACCINE  04/08/2022  ? ? ?  07/28/2016  ?  3:05 PM 02/08/2018  ?  1:54 PM 03/24/2019  ?  3:10 PM  ?Fall Risk  ?Falls in the past year? Yes Yes 0  ?Was there an injury with Fall? No No 0  ?Fall Risk Category Calculator   0  ?Fall Risk Category   Low  ?Patient Fall Risk Level   Low fall risk  ?Patient at  Risk for Falls Due to History of fall(s);Impaired balance/gait;Impaired mobility    ?Fall risk Follow up Falls evaluation completed    ? ?Functional Status Survey: ?  ? ?Vitals:  ? 01/20/22 1333  ?BP: 119/74  ?Pulse: 79  ?Resp

## 2022-01-22 DIAGNOSIS — M6281 Muscle weakness (generalized): Secondary | ICD-10-CM | POA: Diagnosis not present

## 2022-01-22 DIAGNOSIS — R29898 Other symptoms and signs involving the musculoskeletal system: Secondary | ICD-10-CM | POA: Diagnosis not present

## 2022-01-22 DIAGNOSIS — N3946 Mixed incontinence: Secondary | ICD-10-CM | POA: Diagnosis not present

## 2022-01-22 DIAGNOSIS — M1990 Unspecified osteoarthritis, unspecified site: Secondary | ICD-10-CM | POA: Diagnosis not present

## 2022-01-22 DIAGNOSIS — M25662 Stiffness of left knee, not elsewhere classified: Secondary | ICD-10-CM | POA: Diagnosis not present

## 2022-01-22 DIAGNOSIS — R2681 Unsteadiness on feet: Secondary | ICD-10-CM | POA: Diagnosis not present

## 2022-01-23 ENCOUNTER — Encounter: Payer: Self-pay | Admitting: Internal Medicine

## 2022-01-23 ENCOUNTER — Non-Acute Institutional Stay: Payer: Medicare PPO | Admitting: Internal Medicine

## 2022-01-23 DIAGNOSIS — M25561 Pain in right knee: Secondary | ICD-10-CM

## 2022-01-23 DIAGNOSIS — M25562 Pain in left knee: Secondary | ICD-10-CM

## 2022-01-23 DIAGNOSIS — G8929 Other chronic pain: Secondary | ICD-10-CM

## 2022-01-23 DIAGNOSIS — R443 Hallucinations, unspecified: Secondary | ICD-10-CM | POA: Diagnosis not present

## 2022-01-23 DIAGNOSIS — I872 Venous insufficiency (chronic) (peripheral): Secondary | ICD-10-CM

## 2022-01-23 DIAGNOSIS — R454 Irritability and anger: Secondary | ICD-10-CM

## 2022-01-23 DIAGNOSIS — F331 Major depressive disorder, recurrent, moderate: Secondary | ICD-10-CM

## 2022-01-23 DIAGNOSIS — R531 Weakness: Secondary | ICD-10-CM

## 2022-01-23 DIAGNOSIS — R600 Localized edema: Secondary | ICD-10-CM

## 2022-01-23 DIAGNOSIS — G3184 Mild cognitive impairment, so stated: Secondary | ICD-10-CM

## 2022-01-23 NOTE — Progress Notes (Signed)
Location:   [facility] Room Number: [place] of Service:  ALF [id number]) Provider:  Veleta Miners, MD  Mast, Man X, NP  Patient Care Team: Mast, Man X, NP as PCP - General (Internal Medicine) [name], [name], MD as Consulting Physician (Orthopedic Surgery) [name], MD as Consulting Physician (Gastroenterology) System, Provider Not In [name], MD as Consulting Physician (Neurosurgery) [name], MD as Referring Physician (Ophthalmology) Mast, Man X, NP as Nurse Practitioner (Internal Medicine) Almedia Balls, MD as Referring Physician (Orthopedic Surgery) [name], MD as Consulting Physician (Neurosurgery) Elsie Saas, MD as Consulting Physician (Orthopedic Surgery) Allyn Kenner, MD (Dermatology) Kathrynn Ducking, MD (Inactive) as Consulting Physician (Neurology) Franchot Gallo, MD as Consulting Physician (Urology)  Extended Emergency Contact Information Primary Emergency Contact: [name] Address: [address]          [place], [place] [address] [place] Phone: [phone] Relation: Spouse  Code Status:  FULL CODE Goals of care: Advanced Directive information    01/23/2022    2:37 PM  Advanced Directives  Does Patient Have a Medical Advance Directive? Yes  Type of Paramedic of [place];Living will;Out of facility DNR (pink MOST or yellow form)  Does patient want to make changes to medical advance directive? No - Patient declined  Copy of [place] in Chart? Yes - validated most recent copy scanned in chart (See row information)  Pre-existing out of facility DNR order (yellow form or pink MOST form) Pink MOST form placed in chart (order not valid for inpatient use)     Chief Complaint  Patient presents with   Acute Visit    HPI:  Pt is a 88 y.o. male seen today for an acute visit for Hallucinations   Patient has h/o  Peripheral Neuropathy Idiopathic uses wheel chair BPH with urinary Frequency,  LE edema,  Arthritis, Sleep Apnea, Hyperlipidemia Cognitive impairment H/o BPPV  [place]ives in [place] with his wife Recently has been having issues with depression and behaviors including rude to Staff Was started on Namenda for worsenign Cognition and Zoloft was incrased to 75 mg Also had vestaril Prn Since then he has been more confused Sleeping all the time Refuses to get up from bed Last night also had hallucinations Seeing Children in his room Paronoid about pepole stealing  Doe snto rememebr today C/o feeling depressed  Past Medical History:  Diagnosis Date   Abnormal MRI, knee    Per [facility] New Patient Packet    Anemia    Anticoagulated    Anxiety    Arthritis    Cervical disc disorder    Per [facility] New Patient Packet    Complication of anesthesia    states "I was in [place] for several weeks after surgery"caused by tramadol   Constipation    Dysuria 07/28/2016   Edema 07/24/2016   Gait abnormality 07/25/2016   Hard of hearing    Head injury    As a result of a fall, Per [facility] New Patient Packet    Hip arthritis 07/21/2016   History of fall 07/25/2016   Hypercholesteremia    Hypertension    Lumbar spinal stenosis    Memory loss 07/28/2016   mild   Obesity    Per [facility] New Patient Packet    Osteoarthritis of right hip 07/24/2016   Peripheral neuropathy 03/10/2018   Sleep apnea  cpap   Spinal stenosis of lumbar region 05/28/2016   Urinary frequency    Venous thrombosis of leg 07/24/2016   right gastrocnemius   Vertigo    Wears glasses    Weight loss 04/08/2016   Past Surgical History:  Procedure Laterality Date   BACK SURGERY  10/17, 80's   COLONOSCOPY     COLONOSCOPY     Per [facility] Patient Packet, Dr.Schooler    ESOPHAGOGASTRODUODENOSCOPY     LUMBAR LAMINECTOMY/DECOMPRESSION MICRODISCECTOMY Right 05/28/2016   Procedure: Right Lumbar Three-Four Microdiscectomy;  Surgeon: [name], MD;   Location: [place] NEURO ORS;  Service: Neurosurgery;  Laterality: Right;   TOTAL HIP ARTHROPLASTY Right 07/21/2016   Procedure: TOTAL HIP ARTHROPLASTY ANTERIOR APPROACH;  Surgeon: [name], MD;  Location: [place] [facility];  Service: Orthopedics;  Laterality: Right;    Allergies  Allergen Reactions   Tramadol Other (See Comments)    "does not eat" LOSS OF APPETITE    Allergies as of 01/23/2022       Reactions   Tramadol Other (See Comments)   "does not eat" LOSS OF APPETITE        Medication List        Accurate as of Jan 23, 2022  3:00 PM. If you have any questions, ask your nurse or doctor.          acetaminophen 500 MG tablet Commonly known as: TYLENOL Take 1,000 mg by mouth in the morning and at bedtime.   aspirin EC 81 MG tablet Take 81 mg by mouth daily. 8am.   atorvastatin 20 MG tablet Commonly known as: LIPITOR Take 20 mg by mouth daily. 8pm.   B-12 1000 MCG Tabs Take 1 tablet by mouth every other day. 8am.   Biofreeze 4 % Gel Generic drug: Menthol (Topical Analgesic) Apply topically in the morning, at noon, in the evening, and at bedtime.   CoQ10 100 MG Caps Take 1 capsule by mouth daily. 8am.   fexofenadine 180 MG tablet Commonly known as: ALLEGRA Take 180 mg by mouth daily as needed for allergies or rhinitis.   fluticasone 50 MCG/ACT nasal spray Commonly known as: FLONASE Place 2 sprays into both nostrils as needed.   furosemide 20 MG tablet Commonly known as: LASIX Take 20 mg by mouth daily.   gabapentin 100 MG capsule Commonly known as: NEURONTIN Take 200 mg by mouth at bedtime. 8pm.   Glucosamine 500 MG Caps Take by mouth. Take 3 pills to equal '1500mg'$  daily.   hydrocortisone 2.5 % cream Apply topically 2 (two) times daily as needed.   hydrOXYzine 25 MG capsule Commonly known as: VISTARIL Take 25 mg by mouth 2 (two) times daily.   levothyroxine 75 MCG tablet Commonly known as: SYNTHROID Take 75 mcg by mouth daily before  breakfast.   meclizine 25 MG tablet Commonly known as: ANTIVERT Take 25 mg by mouth 3 (three) times daily as needed for dizziness.   meloxicam 15 MG tablet Commonly known as: MOBIC Take 15 mg by mouth daily.   memantine tablet pack Commonly known as: NAMENDA TITRATION PACK Take by mouth See admin instructions. 5 mg/day for =1 week; 5 mg twice daily for =1 week; 15 mg/day given in 5 mg and 10 mg separated doses for =1 week; then 10 mg twice daily   NAT-RUL PSYLLIUM SEED HUSKS PO Take 1 tablet by mouth daily. For constipation   nystatin powder Commonly known as: MYCOSTATIN/NYSTOP Apply 1 application topically every other day.   omeprazole  20 MG capsule Commonly known as: PRILOSEC Take 20 mg by mouth daily.   potassium chloride SA 20 MEQ tablet Commonly known as: KLOR-CON M Take 20 mEq by mouth daily.   sennosides-docusate sodium 8.6-50 MG tablet Commonly known as: SENOKOT-S Take 1 tablet by mouth daily.   sertraline 25 MG tablet Commonly known as: ZOLOFT Take 25 mg by mouth daily.   sertraline 50 MG tablet Commonly known as: ZOLOFT Take 50 mg by mouth daily. Administer along with '25mg'$  tab to equal '75mg'$  daily   tamsulosin 0.4 MG Caps capsule Commonly known as: FLOMAX Take 0.4 mg by mouth at bedtime.        Review of Systems  Constitutional:  Positive for activity change. Negative for appetite change and unexpected weight change.  HENT: Negative.    Respiratory:  Negative for cough and shortness of breath.   Cardiovascular:  Positive for leg swelling.  Gastrointestinal:  Positive for constipation.  Genitourinary:  Negative for frequency.  Musculoskeletal:  Positive for gait problem. Negative for arthralgias and myalgias.  Skin: Negative.  Negative for rash.  Neurological:  Negative for dizziness and weakness.  Psychiatric/Behavioral:  Negative for confusion and sleep disturbance.   All other systems reviewed and are negative.  Immunization History   Administered Date(s) Administered   Influenza-Unspecified 06/11/2015, 06/11/2019, 06/19/2020, 06/27/2021   Moderna Sars-Covid-2 Vaccination 09/10/2019, 10/08/2019, 07/17/2020, 02/05/2021   PFIZER(Purple Top)SARS-COV-2 Vaccination 09/10/2019   Pneumococcal Conjugate-13 09/12/2014   Pneumococcal Polysaccharide-23 04/21/2011   Pneumococcal-Unspecified 04/21/2019   Td 04/22/2002   Tetanus 05/16/2020   Unspecified SARS-COV-2 Vaccination 05/28/2021   Zoster Recombinat (Shingrix) 09/14/2021, 11/25/2021   Zoster, Live 04/27/2012   Pertinent  Health Maintenance Due  Topic Date Due   INFLUENZA VACCINE  04/08/2022      07/28/2016    3:05 PM 02/08/2018    1:54 PM 03/24/2019    3:10 PM  Fall Risk  Falls in the past year? Yes Yes 0  Was there an injury with Fall? No No 0  Fall Risk Category Calculator   0  Fall Risk Category   Low  Patient Fall Risk Level   Low fall risk  Patient at Risk for Falls Due to History of fall(s);Impaired balance/gait;Impaired mobility    Fall risk Follow up Falls evaluation completed     Functional Status Survey:    Vitals:   01/23/22 1430  BP: 132/68  Pulse: 70  Resp: 20  Temp: (!) 97.4 F (36.3 C)  SpO2: 92%  Weight: 198 lb (89.8 kg)  Height: '5\' 4"'$  (1.626 m)   Body mass index is 33.99 kg/m. Physical Exam Vitals reviewed.  Constitutional:      Appearance: Normal appearance.  HENT:     Head: Normocephalic.     Nose: Nose normal.     Mouth/Throat:     Mouth: Mucous membranes are moist.     Pharynx: Oropharynx is clear.  Eyes:     Pupils: Pupils are equal, round, and reactive to light.  Cardiovascular:     Rate and Rhythm: Normal rate and regular rhythm.     Pulses: Normal pulses.     Heart sounds: No murmur heard. Pulmonary:     Effort: Pulmonary effort is normal. No respiratory distress.     Breath sounds: Normal breath sounds. No rales.  Abdominal:     General: Abdomen is flat. Bowel sounds are normal.     Palpations: Abdomen is soft.   Musculoskeletal:     Cervical back: Neck supple.  Comments: Mild swelling bilateral  Skin:    General: Skin is warm.  Neurological:     General: No focal deficit present.     Mental Status: He is alert.     Comments: His Mental status was at baseline today Was alert and responsive Does have cognitive impairment   Psychiatric:        Mood and Affect: Mood normal.        Thought Content: Thought content normal.    Labs reviewed: Recent Labs    04/23/21 0000 10/22/21 0000 01/16/22 0000  NA 140 142 144  K 4.6 4.3 4.1  CL 106 107 105  CO2 29* 29* 29*  BUN 17 21 26*  CREATININE 1.0 1.0 1.1  CALCIUM 8.7 8.6* 8.6*   Recent Labs    03/26/21 0000 04/23/21 0000  AST 18 18  ALT 14 14  ALKPHOS 91 87  ALBUMIN 3.9 3.8   Recent Labs    03/26/21 0000 04/23/21 0000  WBC 4.3 4.6  NEUTROABS 2,786.00 2,870.00  HGB 13.5 13.4*  HCT 42 41  PLT 173 173   Lab Results  Component Value Date   TSH 2.89 11/22/2021   No results found for: HGBA1C Lab Results  Component Value Date   CHOL 114 05/31/2021   HDL 25 (A) 05/31/2021   LDLCALC 66 05/31/2021   TRIG 149 05/31/2021   CHOLHDL 5.1 (H) 03/31/2019    Significant Diagnostic Results in last 30 days:  No results found.  Assessment/Plan  1. Hallucinations Discontinue Namenda Change Zoloft to 50 mg  Start on Zyprexa 2.5 mg QHS  2. Moderate episode of recurrent major depressive disorder ([place]) Continue Zoloft Will consider Wellbutrin  Also starting Zyprexa will help  3. Weakness Therapy working with him now Also will have Caregivers come and help with ADLS 4. Mild  dementia with agitation, unspecified dementia type ([place]) Will discontinue Namenda for now MMSE 24/30 5. Chronic pain of both knees Continue Mobic and Neurontin    6 Edema of both lower legs due to peripheral venous insufficiency Better on Lasix Urinary frequency On Flomax Has seen Urology before   Hypothyroidism, unspecified type TSH normal  in 03/23  HLD on statin   Family/ staff Communication:   Labs/tests ordered:

## 2022-01-29 DIAGNOSIS — M25662 Stiffness of left knee, not elsewhere classified: Secondary | ICD-10-CM | POA: Diagnosis not present

## 2022-01-29 DIAGNOSIS — R2681 Unsteadiness on feet: Secondary | ICD-10-CM | POA: Diagnosis not present

## 2022-01-29 DIAGNOSIS — M1990 Unspecified osteoarthritis, unspecified site: Secondary | ICD-10-CM | POA: Diagnosis not present

## 2022-01-29 DIAGNOSIS — N3946 Mixed incontinence: Secondary | ICD-10-CM | POA: Diagnosis not present

## 2022-01-29 DIAGNOSIS — M6281 Muscle weakness (generalized): Secondary | ICD-10-CM | POA: Diagnosis not present

## 2022-01-29 DIAGNOSIS — R29898 Other symptoms and signs involving the musculoskeletal system: Secondary | ICD-10-CM | POA: Diagnosis not present

## 2022-01-30 DIAGNOSIS — M25662 Stiffness of left knee, not elsewhere classified: Secondary | ICD-10-CM | POA: Diagnosis not present

## 2022-01-30 DIAGNOSIS — R29898 Other symptoms and signs involving the musculoskeletal system: Secondary | ICD-10-CM | POA: Diagnosis not present

## 2022-01-30 DIAGNOSIS — M1990 Unspecified osteoarthritis, unspecified site: Secondary | ICD-10-CM | POA: Diagnosis not present

## 2022-01-30 DIAGNOSIS — N3946 Mixed incontinence: Secondary | ICD-10-CM | POA: Diagnosis not present

## 2022-01-30 DIAGNOSIS — R2681 Unsteadiness on feet: Secondary | ICD-10-CM | POA: Diagnosis not present

## 2022-01-30 DIAGNOSIS — M6281 Muscle weakness (generalized): Secondary | ICD-10-CM | POA: Diagnosis not present

## 2022-01-31 DIAGNOSIS — N3946 Mixed incontinence: Secondary | ICD-10-CM | POA: Diagnosis not present

## 2022-01-31 DIAGNOSIS — M25662 Stiffness of left knee, not elsewhere classified: Secondary | ICD-10-CM | POA: Diagnosis not present

## 2022-01-31 DIAGNOSIS — M6281 Muscle weakness (generalized): Secondary | ICD-10-CM | POA: Diagnosis not present

## 2022-01-31 DIAGNOSIS — M1990 Unspecified osteoarthritis, unspecified site: Secondary | ICD-10-CM | POA: Diagnosis not present

## 2022-01-31 DIAGNOSIS — R29898 Other symptoms and signs involving the musculoskeletal system: Secondary | ICD-10-CM | POA: Diagnosis not present

## 2022-01-31 DIAGNOSIS — R2681 Unsteadiness on feet: Secondary | ICD-10-CM | POA: Diagnosis not present

## 2022-02-04 DIAGNOSIS — M1990 Unspecified osteoarthritis, unspecified site: Secondary | ICD-10-CM | POA: Diagnosis not present

## 2022-02-04 DIAGNOSIS — R29898 Other symptoms and signs involving the musculoskeletal system: Secondary | ICD-10-CM | POA: Diagnosis not present

## 2022-02-04 DIAGNOSIS — M6281 Muscle weakness (generalized): Secondary | ICD-10-CM | POA: Diagnosis not present

## 2022-02-04 DIAGNOSIS — N3946 Mixed incontinence: Secondary | ICD-10-CM | POA: Diagnosis not present

## 2022-02-04 DIAGNOSIS — R2681 Unsteadiness on feet: Secondary | ICD-10-CM | POA: Diagnosis not present

## 2022-02-04 DIAGNOSIS — M25662 Stiffness of left knee, not elsewhere classified: Secondary | ICD-10-CM | POA: Diagnosis not present

## 2022-02-05 ENCOUNTER — Encounter: Payer: Self-pay | Admitting: Adult Health

## 2022-02-05 ENCOUNTER — Non-Acute Institutional Stay: Payer: Medicare PPO | Admitting: Adult Health

## 2022-02-05 DIAGNOSIS — M6281 Muscle weakness (generalized): Secondary | ICD-10-CM | POA: Diagnosis not present

## 2022-02-05 DIAGNOSIS — R251 Tremor, unspecified: Secondary | ICD-10-CM

## 2022-02-05 DIAGNOSIS — R946 Abnormal results of thyroid function studies: Secondary | ICD-10-CM | POA: Diagnosis not present

## 2022-02-05 DIAGNOSIS — R2681 Unsteadiness on feet: Secondary | ICD-10-CM | POA: Diagnosis not present

## 2022-02-05 DIAGNOSIS — E039 Hypothyroidism, unspecified: Secondary | ICD-10-CM | POA: Diagnosis not present

## 2022-02-05 DIAGNOSIS — N3946 Mixed incontinence: Secondary | ICD-10-CM | POA: Diagnosis not present

## 2022-02-05 DIAGNOSIS — R350 Frequency of micturition: Secondary | ICD-10-CM

## 2022-02-05 DIAGNOSIS — G609 Hereditary and idiopathic neuropathy, unspecified: Secondary | ICD-10-CM

## 2022-02-05 DIAGNOSIS — R29898 Other symptoms and signs involving the musculoskeletal system: Secondary | ICD-10-CM | POA: Diagnosis not present

## 2022-02-05 DIAGNOSIS — Z79899 Other long term (current) drug therapy: Secondary | ICD-10-CM | POA: Diagnosis not present

## 2022-02-05 DIAGNOSIS — N390 Urinary tract infection, site not specified: Secondary | ICD-10-CM | POA: Diagnosis not present

## 2022-02-05 DIAGNOSIS — I10 Essential (primary) hypertension: Secondary | ICD-10-CM | POA: Diagnosis not present

## 2022-02-05 DIAGNOSIS — M25662 Stiffness of left knee, not elsewhere classified: Secondary | ICD-10-CM | POA: Diagnosis not present

## 2022-02-05 DIAGNOSIS — M1990 Unspecified osteoarthritis, unspecified site: Secondary | ICD-10-CM | POA: Diagnosis not present

## 2022-02-05 NOTE — Progress Notes (Signed)
Location:  [facility] Room Number: JK093/G Place of Service:  ALF [phone]) Provider: Durenda Age, NP Patient Care Team: Mast, Man X, NP as PCP - General (Internal Medicine) [name], [name], MD as Consulting Physician (Orthopedic Surgery) [name], MD as Consulting Physician (Gastroenterology) System, Provider Not In [name], MD as Consulting Physician (Neurosurgery) [name], MD as Referring Physician (Ophthalmology) Mast, Man X, NP as Nurse Practitioner (Internal Medicine) Almedia Balls, MD as Referring Physician (Orthopedic Surgery) [name], MD as Consulting Physician (Neurosurgery) Elsie Saas, MD as Consulting Physician (Orthopedic Surgery) Allyn Kenner, MD (Dermatology) Kathrynn Ducking, MD (Inactive) as Consulting Physician (Neurology) Franchot Gallo, MD as Consulting Physician (Urology)  Extended Emergency Contact Information Primary Emergency Contact: [name] Address: [address]          [place], [place] [address] [place] Phone: [phone] Relation: Spouse  Code Status:  Full code Goals of care: Advanced Directive information    02/05/2022    3:11 PM  Advanced Directives  Does Patient Have a Medical Advance Directive? Yes  Type of Paramedic of [place];Living will;Out of facility DNR (pink MOST or yellow form)  Does patient want to make changes to medical advance directive? No - Patient declined  Copy of [place] in Chart? Yes - validated most recent copy scanned in chart (See row information)  Pre-existing out of facility DNR order (yellow form or pink MOST form) Pink MOST form placed in chart (order not valid for inpatient use)     Chief Complaint  Patient presents with   Acute Visit    Arms twitching and shaking    HPI:  Pt is a 88 y.o. male seen today for an acute visit for complaints of  shaking and twitching. He is a resident of [facility]. He was seen in his room today. Tremors were not occurring when he was seen but later on when he was queried regarding the shaking, he started manifesting it. Wife was in the room and was able to give the history as well. Resident stated that," If I think about it and tries to control it, then it stops. Sometimes it just happens". Wife stated that it mostly occurs at night and sometimes in the morning. He is currently takes Gabapentin 200 mg daily for neuropathy which was prescribed by neurologist, Dr. Jannifer [facility]. He takes Levothyroxine 75 mcg daily for hypothyroidism. Latest tsh 2.89, 11/22/21. He denies fever, pain nor dysuria. He takes Flomax 0.4 mg at bedtime for urinary frequency.   Past Medical History:  Diagnosis Date   Abnormal MRI, knee    Per [facility] New Patient Packet    Anemia    Anticoagulated    Anxiety    Arthritis    Cervical disc disorder    Per [facility] New Patient Packet    Complication of anesthesia    states "I was in [place] for several weeks after surgery"caused by tramadol   Constipation    Dysuria 07/28/2016   Edema 07/24/2016   Gait abnormality 07/25/2016   Hard of hearing    Head injury    As a result of a fall, Per [facility] New Patient Packet    Hip arthritis 07/21/2016   History of fall 07/25/2016   Hypercholesteremia    Hypertension    Lumbar spinal stenosis    Memory loss 07/28/2016   mild  Obesity    Per [facility] New Patient Packet    Osteoarthritis of right hip 07/24/2016   Peripheral neuropathy 03/10/2018   Sleep apnea    cpap   Spinal stenosis of lumbar region 05/28/2016   Urinary frequency    Venous thrombosis of leg 07/24/2016   right gastrocnemius   Vertigo    Wears glasses    Weight loss 04/08/2016   Past Surgical History:  Procedure Laterality Date   BACK SURGERY  10/17, 80's   COLONOSCOPY     COLONOSCOPY     Per [facility] Patient Packet, Dr.Schooler    ESOPHAGOGASTRODUODENOSCOPY     LUMBAR  LAMINECTOMY/DECOMPRESSION MICRODISCECTOMY Right 05/28/2016   Procedure: Right Lumbar Three-Four Microdiscectomy;  Surgeon: [name], MD;  Location: [facility] NEURO ORS;  Service: Neurosurgery;  Laterality: Right;   TOTAL HIP ARTHROPLASTY Right 07/21/2016   Procedure: TOTAL HIP ARTHROPLASTY ANTERIOR APPROACH;  Surgeon: [name], MD;  Location: [facility];  Service: Orthopedics;  Laterality: Right;    Allergies  Allergen Reactions   Tramadol Other (See Comments)    "does not eat" LOSS OF APPETITE    Outpatient Encounter Medications as of 02/05/2022  Medication Sig   acetaminophen (TYLENOL) 500 MG tablet Take 1,000 mg by mouth as needed.   aspirin EC 81 MG tablet Take 81 mg by mouth daily. 8am.   atorvastatin (LIPITOR) 20 MG tablet Take 20 mg by mouth daily. 8pm.   Coenzyme Q10 (COQ10) 100 MG CAPS Take 1 capsule by mouth daily. 8am.   Cyanocobalamin (B-12) 1000 MCG TABS Take 1 tablet by mouth every other day. 8am.   fexofenadine (ALLEGRA) 180 MG tablet Take 180 mg by mouth daily as needed for allergies or rhinitis.   fluticasone (FLONASE) 50 MCG/ACT nasal spray Place 2 sprays into both nostrils as needed.    furosemide (LASIX) 20 MG tablet Take 20 mg by mouth daily.   gabapentin (NEURONTIN) 100 MG capsule Take 200 mg by mouth at bedtime. 8pm.   Glucosamine 500 MG CAPS Take by mouth. Take 3 pills to equal '1500mg'$  daily.   hydrocortisone 2.5 % cream Apply topically 2 (two) times daily as needed.   hydrOXYzine (VISTARIL) 25 MG capsule Take 25 mg by mouth 2 (two) times daily as needed for anxiety.   levothyroxine (SYNTHROID) 75 MCG tablet Take 75 mcg by mouth daily before breakfast.   meclizine (ANTIVERT) 25 MG tablet Take 25 mg by mouth 3 (three) times daily as needed for dizziness.   meloxicam (MOBIC) 15 MG tablet Take 15 mg by mouth daily.    Menthol, Topical Analgesic, (BIOFREEZE) 4 % GEL Apply topically in the morning, at noon, in the evening, and at bedtime.   NAT-RUL PSYLLIUM SEED HUSKS PO  Take 1 tablet by mouth daily. For constipation   nystatin (MYCOSTATIN/NYSTOP) powder Apply 1 application topically every other day.   OLANZapine (ZYPREXA) 2.5 MG tablet Take 2.5 mg by mouth at bedtime.   omeprazole (PRILOSEC) 20 MG capsule Take 20 mg by mouth daily.   potassium chloride SA (KLOR-CON M) 20 MEQ tablet Take 20 mEq by mouth daily.   sennosides-docusate sodium (SENOKOT-S) 8.6-50 MG tablet Take 1 tablet by mouth daily.   sertraline (ZOLOFT) 50 MG tablet Take 50 mg by mouth daily. Administer along with '25mg'$  tab to equal '75mg'$  daily   tamsulosin (FLOMAX) 0.4 MG CAPS capsule Take 0.4 mg by mouth at bedtime.   No facility-administered encounter medications on file as of 02/05/2022.    Review of  Systems  Constitutional:  Negative for activity change, appetite change and fever.  HENT:  Negative for sore throat.   Eyes: Negative.   Cardiovascular:  Negative for chest pain and leg swelling.  Gastrointestinal:  Negative for abdominal distention, diarrhea and vomiting.  Genitourinary:  Negative for dysuria, frequency and urgency.  Skin:  Negative for color change.  Neurological:  Negative for dizziness and headaches.  Psychiatric/Behavioral:  Negative for behavioral problems and sleep disturbance. The patient is not nervous/anxious.    Immunization History  Administered Date(s) Administered   Influenza-Unspecified 06/11/2015, 06/11/2019, 06/19/2020, 06/27/2021   Moderna Sars-Covid-2 Vaccination 09/10/2019, 10/08/2019, 07/17/2020, 02/05/2021   PFIZER(Purple Top)SARS-COV-2 Vaccination 09/10/2019   Pneumococcal Conjugate-13 09/12/2014   Pneumococcal Polysaccharide-23 04/21/2011   Pneumococcal-Unspecified 04/21/2019   Td 04/22/2002   Tetanus 05/16/2020   Unspecified SARS-COV-2 Vaccination 05/28/2021   Zoster Recombinat (Shingrix) 09/14/2021, 11/25/2021   Zoster, Live 04/27/2012   Pertinent  Health Maintenance Due  Topic Date Due   INFLUENZA VACCINE  04/08/2022      07/28/2016     3:05 PM 02/08/2018    1:54 PM 03/24/2019    3:10 PM  Fall Risk  Falls in the past year? Yes Yes 0  Was there an injury with Fall? No No 0  Fall Risk Category Calculator   0  Fall Risk Category   Low  Patient Fall Risk Level   Low fall risk  Patient at Risk for Falls Due to History of fall(s);Impaired balance/gait;Impaired mobility    Fall risk Follow up Falls evaluation completed     Functional Status Survey:    Vitals:   02/05/22 1504  BP: 117/79  Pulse: 80  Resp: 16  Temp: (!) 97.3 F (36.3 C)  SpO2: 94%  Weight: 198 lb (89.8 kg)  Height: '5\' 4"'$  (1.626 m)   Body mass index is 33.99 kg/m. Physical Exam Constitutional:      General: He is not in acute distress.    Appearance: He is obese.  HENT:     Head: Normocephalic and atraumatic.     Mouth/Throat:     Mouth: Mucous membranes are moist.  Eyes:     Conjunctiva/sclera: Conjunctivae normal.  Cardiovascular:     Rate and Rhythm: Normal rate and regular rhythm.     Pulses: Normal pulses.     Heart sounds: Normal heart sounds.  Pulmonary:     Effort: Pulmonary effort is normal.     Breath sounds: Normal breath sounds.  Abdominal:     General: Bowel sounds are normal.     Palpations: Abdomen is soft.  Musculoskeletal:        General: No swelling.     Cervical back: Normal range of motion.  Skin:    General: Skin is warm and dry.  Neurological:     Mental Status: He is alert. Mental status is at baseline. He is disoriented.     Comments: Alert to self, disoriented to time and place.  Psychiatric:        Mood and Affect: Mood normal.        Behavior: Behavior normal.        Thought Content: Thought content normal.        Judgment: Judgment normal.    Labs reviewed: Recent Labs    04/23/21 0000 10/22/21 0000 01/16/22 0000  NA 140 142 144  K 4.6 4.3 4.1  CL 106 107 105  CO2 29* 29* 29*  BUN 17 21 26*  CREATININE 1.0 1.0 1.1  CALCIUM 8.7 8.6* 8.6*   Recent Labs    03/26/21 0000 04/23/21 0000  AST  18 18  ALT 14 14  ALKPHOS 91 87  ALBUMIN 3.9 3.8   Recent Labs    03/26/21 0000 04/23/21 0000  WBC 4.3 4.6  NEUTROABS 2,786.00 2,870.00  HGB 13.5 13.4*  HCT 42 41  PLT 173 173   Lab Results  Component Value Date   TSH 2.89 11/22/2021   No results found for: HGBA1C Lab Results  Component Value Date   CHOL 114 05/31/2021   HDL 25 (A) 05/31/2021   LDLCALC 66 05/31/2021   TRIG 149 05/31/2021   CHOLHDL 5.1 (H) 03/31/2019    Significant Diagnostic Results in last 30 days:  No results found.  Assessment/Plan  1. Tremor -   will continue Neurontin -   ordered CBC, BMP, tsh and UA with CS  2. Idiopathic peripheral neuropathy -   stable, continue Neurontin  3. Acquired hypothyroidism -  continue Levothyroxine -  will re-check tsh  4. Urinary frequency -  continue Flomax     Family/ staff Communication:  Discussed plan of care with resident, wife and charge nurse.  Labs/tests ordered:    CBC with differentials, BMP with GFR, TSH and urinalysis with culture and sensitivity

## 2022-02-06 DIAGNOSIS — M25662 Stiffness of left knee, not elsewhere classified: Secondary | ICD-10-CM | POA: Diagnosis not present

## 2022-02-06 DIAGNOSIS — M6281 Muscle weakness (generalized): Secondary | ICD-10-CM | POA: Diagnosis not present

## 2022-02-06 DIAGNOSIS — R1312 Dysphagia, oropharyngeal phase: Secondary | ICD-10-CM | POA: Diagnosis not present

## 2022-02-06 DIAGNOSIS — N3946 Mixed incontinence: Secondary | ICD-10-CM | POA: Diagnosis not present

## 2022-02-06 DIAGNOSIS — M1990 Unspecified osteoarthritis, unspecified site: Secondary | ICD-10-CM | POA: Diagnosis not present

## 2022-02-06 DIAGNOSIS — R2681 Unsteadiness on feet: Secondary | ICD-10-CM | POA: Diagnosis not present

## 2022-02-06 DIAGNOSIS — R1310 Dysphagia, unspecified: Secondary | ICD-10-CM | POA: Diagnosis not present

## 2022-02-06 DIAGNOSIS — R29898 Other symptoms and signs involving the musculoskeletal system: Secondary | ICD-10-CM | POA: Diagnosis not present

## 2022-02-06 DIAGNOSIS — R41841 Cognitive communication deficit: Secondary | ICD-10-CM | POA: Diagnosis not present

## 2022-02-06 LAB — TSH: TSH: 0.98 (ref 0.41–5.90)

## 2022-02-06 LAB — BASIC METABOLIC PANEL
BUN: 18 (ref 4–21)
CO2: 29 — AB (ref 13–22)
Chloride: 105 (ref 99–108)
Creatinine: 1 (ref 0.6–1.3)
Glucose: 126
Potassium: 4.5 mEq/L (ref 3.5–5.1)
Sodium: 140 (ref 137–147)

## 2022-02-06 LAB — CBC AND DIFFERENTIAL
HCT: 44 (ref 41–53)
Hemoglobin: 14.1 (ref 13.5–17.5)
Platelets: 143 10*3/uL — AB (ref 150–400)
WBC: 5.5

## 2022-02-06 LAB — COMPREHENSIVE METABOLIC PANEL
Calcium: 8.8 (ref 8.7–10.7)
eGFR: 72

## 2022-02-06 LAB — CBC: RBC: 4.91 (ref 3.87–5.11)

## 2022-02-07 DIAGNOSIS — R1310 Dysphagia, unspecified: Secondary | ICD-10-CM | POA: Diagnosis not present

## 2022-02-07 DIAGNOSIS — M25662 Stiffness of left knee, not elsewhere classified: Secondary | ICD-10-CM | POA: Diagnosis not present

## 2022-02-07 DIAGNOSIS — M1990 Unspecified osteoarthritis, unspecified site: Secondary | ICD-10-CM | POA: Diagnosis not present

## 2022-02-07 DIAGNOSIS — M6281 Muscle weakness (generalized): Secondary | ICD-10-CM | POA: Diagnosis not present

## 2022-02-07 DIAGNOSIS — R29898 Other symptoms and signs involving the musculoskeletal system: Secondary | ICD-10-CM | POA: Diagnosis not present

## 2022-02-07 DIAGNOSIS — R1312 Dysphagia, oropharyngeal phase: Secondary | ICD-10-CM | POA: Diagnosis not present

## 2022-02-07 DIAGNOSIS — N3946 Mixed incontinence: Secondary | ICD-10-CM | POA: Diagnosis not present

## 2022-02-07 DIAGNOSIS — R41841 Cognitive communication deficit: Secondary | ICD-10-CM | POA: Diagnosis not present

## 2022-02-07 DIAGNOSIS — R2681 Unsteadiness on feet: Secondary | ICD-10-CM | POA: Diagnosis not present

## 2022-02-10 DIAGNOSIS — R29898 Other symptoms and signs involving the musculoskeletal system: Secondary | ICD-10-CM | POA: Diagnosis not present

## 2022-02-10 DIAGNOSIS — R41841 Cognitive communication deficit: Secondary | ICD-10-CM | POA: Diagnosis not present

## 2022-02-10 DIAGNOSIS — M25662 Stiffness of left knee, not elsewhere classified: Secondary | ICD-10-CM | POA: Diagnosis not present

## 2022-02-10 DIAGNOSIS — R2681 Unsteadiness on feet: Secondary | ICD-10-CM | POA: Diagnosis not present

## 2022-02-10 DIAGNOSIS — R1312 Dysphagia, oropharyngeal phase: Secondary | ICD-10-CM | POA: Diagnosis not present

## 2022-02-10 DIAGNOSIS — M1990 Unspecified osteoarthritis, unspecified site: Secondary | ICD-10-CM | POA: Diagnosis not present

## 2022-02-10 DIAGNOSIS — M6281 Muscle weakness (generalized): Secondary | ICD-10-CM | POA: Diagnosis not present

## 2022-02-10 DIAGNOSIS — N3946 Mixed incontinence: Secondary | ICD-10-CM | POA: Diagnosis not present

## 2022-02-10 DIAGNOSIS — R1310 Dysphagia, unspecified: Secondary | ICD-10-CM | POA: Diagnosis not present

## 2022-02-11 DIAGNOSIS — R29898 Other symptoms and signs involving the musculoskeletal system: Secondary | ICD-10-CM | POA: Diagnosis not present

## 2022-02-11 DIAGNOSIS — M25662 Stiffness of left knee, not elsewhere classified: Secondary | ICD-10-CM | POA: Diagnosis not present

## 2022-02-11 DIAGNOSIS — M1990 Unspecified osteoarthritis, unspecified site: Secondary | ICD-10-CM | POA: Diagnosis not present

## 2022-02-11 DIAGNOSIS — R1310 Dysphagia, unspecified: Secondary | ICD-10-CM | POA: Diagnosis not present

## 2022-02-11 DIAGNOSIS — R41841 Cognitive communication deficit: Secondary | ICD-10-CM | POA: Diagnosis not present

## 2022-02-11 DIAGNOSIS — R2681 Unsteadiness on feet: Secondary | ICD-10-CM | POA: Diagnosis not present

## 2022-02-11 DIAGNOSIS — R1312 Dysphagia, oropharyngeal phase: Secondary | ICD-10-CM | POA: Diagnosis not present

## 2022-02-11 DIAGNOSIS — N3946 Mixed incontinence: Secondary | ICD-10-CM | POA: Diagnosis not present

## 2022-02-11 DIAGNOSIS — M6281 Muscle weakness (generalized): Secondary | ICD-10-CM | POA: Diagnosis not present

## 2022-02-12 DIAGNOSIS — R1310 Dysphagia, unspecified: Secondary | ICD-10-CM | POA: Diagnosis not present

## 2022-02-12 DIAGNOSIS — M1990 Unspecified osteoarthritis, unspecified site: Secondary | ICD-10-CM | POA: Diagnosis not present

## 2022-02-12 DIAGNOSIS — R41841 Cognitive communication deficit: Secondary | ICD-10-CM | POA: Diagnosis not present

## 2022-02-12 DIAGNOSIS — N3946 Mixed incontinence: Secondary | ICD-10-CM | POA: Diagnosis not present

## 2022-02-12 DIAGNOSIS — M25662 Stiffness of left knee, not elsewhere classified: Secondary | ICD-10-CM | POA: Diagnosis not present

## 2022-02-12 DIAGNOSIS — R29898 Other symptoms and signs involving the musculoskeletal system: Secondary | ICD-10-CM | POA: Diagnosis not present

## 2022-02-12 DIAGNOSIS — R1312 Dysphagia, oropharyngeal phase: Secondary | ICD-10-CM | POA: Diagnosis not present

## 2022-02-12 DIAGNOSIS — R2681 Unsteadiness on feet: Secondary | ICD-10-CM | POA: Diagnosis not present

## 2022-02-12 DIAGNOSIS — M6281 Muscle weakness (generalized): Secondary | ICD-10-CM | POA: Diagnosis not present

## 2022-02-17 ENCOUNTER — Non-Acute Institutional Stay: Payer: Medicare PPO | Admitting: Adult Health

## 2022-02-17 ENCOUNTER — Encounter: Payer: Self-pay | Admitting: Adult Health

## 2022-02-17 DIAGNOSIS — G609 Hereditary and idiopathic neuropathy, unspecified: Secondary | ICD-10-CM

## 2022-02-17 DIAGNOSIS — F331 Major depressive disorder, recurrent, moderate: Secondary | ICD-10-CM | POA: Diagnosis not present

## 2022-02-17 DIAGNOSIS — R531 Weakness: Secondary | ICD-10-CM | POA: Diagnosis not present

## 2022-02-17 DIAGNOSIS — E039 Hypothyroidism, unspecified: Secondary | ICD-10-CM | POA: Diagnosis not present

## 2022-02-17 NOTE — Progress Notes (Signed)
Location:  [facility] Room Number: 903-B Place of Service:  ALF [phone]) Provider:  Durenda Age, DNP, FNP-BC  Patient Care Team: Mast, Man X, NP as PCP - General (Internal Medicine) [name], [name], MD as Consulting Physician (Orthopedic Surgery) [name], MD as Consulting Physician (Gastroenterology) System, Provider Not In [name], MD as Consulting Physician (Neurosurgery) [name], MD as Referring Physician (Ophthalmology) Mast, Man X, NP as Nurse Practitioner (Internal Medicine) Almedia Balls, MD as Referring Physician (Orthopedic Surgery) [name], MD as Consulting Physician (Neurosurgery) Elsie Saas, MD as Consulting Physician (Orthopedic Surgery) Allyn Kenner, MD (Dermatology) Kathrynn Ducking, MD (Inactive) as Consulting Physician (Neurology) Franchot Gallo, MD as Consulting Physician (Urology)  Extended Emergency Contact Information Primary Emergency Contact: [name] Address: [address]          [place], [place] [address] [place] Phone: [phone] Relation: Spouse  Code Status:  Full Code  Goals of care: Advanced Directive information    02/17/2022    2:27 PM  Advanced Directives  Does Patient Have a Medical Advance Directive? Yes  Type of Paramedic of [place];Living will;Out of facility DNR (pink MOST or yellow form)  Does patient want to make changes to medical advance directive? No - Patient declined  Copy of [place] in Chart? Yes - validated most recent copy scanned in chart (See row information)  Pre-existing out of facility DNR order (yellow form or pink MOST form) Pink MOST form placed in chart (order not valid for inpatient use)     Chief Complaint  Patient presents with   Acute Visit    Peer to peer review     HPI:  Pt is a 88 y.o. male seen today for an acute visit regarding a  peer to peer review via telephone conference He is a current resident of [facility]. He has a current MMSE 17/30 which is ranging in a moderate cognitive impairment. He has been noted to need more assistance physically due to decline in mental status. Family requested for resident to be placed in SNF. He uses a motorized wheelchair. Discussed resident's case with Dr. Duaine Dredge and Ermalinda Barrios (social worker).  Dr Magdalene Molly stated that resident needs to have a skilled level need for insurance to cover his stay at SNF, like IV fluid or IV antibiotic.   Family will be notified by social worker regarding the peer to peer meeting.   Past Medical History:  Diagnosis Date   Abnormal MRI, knee    Per [facility] New Patient Packet    Anemia    Anticoagulated    Anxiety    Arthritis    Cervical disc disorder    Per [facility] New Patient Packet    Complication of anesthesia    states "I was in [place] for several weeks after surgery"caused by tramadol   Constipation    Dysuria 07/28/2016   Edema 07/24/2016   Gait abnormality 07/25/2016   Hard of hearing    Head injury    As a result of a fall, Per [facility] New Patient Packet    Hip arthritis 07/21/2016   History of fall 07/25/2016   Hypercholesteremia    Hypertension    Lumbar spinal stenosis    Memory loss 07/28/2016   mild   Obesity    Per [facility] New Patient Packet  Osteoarthritis of right hip 07/24/2016   Peripheral neuropathy 03/10/2018   Sleep apnea    cpap   Spinal stenosis of lumbar region 05/28/2016   Urinary frequency    Venous thrombosis of leg 07/24/2016   right gastrocnemius   Vertigo    Wears glasses    Weight loss 04/08/2016   Past Surgical History:  Procedure Laterality Date   BACK SURGERY  10/17, 80's   COLONOSCOPY     COLONOSCOPY     Per [facility] Patient Packet, Dr.Schooler    ESOPHAGOGASTRODUODENOSCOPY     LUMBAR LAMINECTOMY/DECOMPRESSION MICRODISCECTOMY Right 05/28/2016   Procedure: Right Lumbar Three-Four  Microdiscectomy;  Surgeon: [name], MD;  Location: [place] NEURO ORS;  Service: Neurosurgery;  Laterality: Right;   TOTAL HIP ARTHROPLASTY Right 07/21/2016   Procedure: TOTAL HIP ARTHROPLASTY ANTERIOR APPROACH;  Surgeon: [name], MD;  Location: [facility];  Service: Orthopedics;  Laterality: Right;    Allergies  Allergen Reactions   Tramadol Other (See Comments)    "does not eat" LOSS OF APPETITE    Outpatient Encounter Medications as of 02/17/2022  Medication Sig   acetaminophen (TYLENOL) 500 MG tablet Take 1,000 mg by mouth as needed.   aspirin EC 81 MG tablet Take 81 mg by mouth daily. 8am.   atorvastatin (LIPITOR) 20 MG tablet Take 20 mg by mouth daily. 8pm.   Coenzyme Q10 (COQ10) 100 MG CAPS Take 1 capsule by mouth daily. 8am.   Cyanocobalamin (B-12) 1000 MCG TABS Take 1 tablet by mouth every other day. 8am.   fexofenadine (ALLEGRA) 180 MG tablet Take 180 mg by mouth daily as needed for allergies or rhinitis.   fluticasone (FLONASE) 50 MCG/ACT nasal spray Place 2 sprays into both nostrils as needed.    furosemide (LASIX) 20 MG tablet Take 20 mg by mouth daily.   gabapentin (NEURONTIN) 100 MG capsule Take 200 mg by mouth at bedtime. 8pm.   Glucosamine 500 MG CAPS Take by mouth. Take 3 pills to equal '1500mg'$  daily.   hydrocortisone 2.5 % cream Apply topically 2 (two) times daily as needed.   hydrOXYzine (VISTARIL) 25 MG capsule Take 25 mg by mouth 2 (two) times daily as needed for anxiety.   levothyroxine (SYNTHROID) 75 MCG tablet Take 75 mcg by mouth daily before breakfast.   meclizine (ANTIVERT) 25 MG tablet Take 25 mg by mouth 3 (three) times daily as needed for dizziness.   meloxicam (MOBIC) 15 MG tablet Take 15 mg by mouth daily.    Menthol, Topical Analgesic, (BIOFREEZE) 4 % GEL Apply topically in the morning, at noon, in the evening, and at bedtime.   NAT-RUL PSYLLIUM SEED HUSKS PO Take 1 tablet by mouth daily. For constipation   nystatin (MYCOSTATIN/NYSTOP) powder Apply 1  application topically every other day.   OLANZapine (ZYPREXA) 2.5 MG tablet Take 2.5 mg by mouth at bedtime.   omeprazole (PRILOSEC) 20 MG capsule Take 20 mg by mouth daily.   potassium chloride SA (KLOR-CON M) 20 MEQ tablet Take 20 mEq by mouth daily.   sennosides-docusate sodium (SENOKOT-S) 8.6-50 MG tablet Take 1 tablet by mouth daily.   sertraline (ZOLOFT) 50 MG tablet Take 50 mg by mouth daily. Administer along with '25mg'$  tab to equal '75mg'$  daily   tamsulosin (FLOMAX) 0.4 MG CAPS capsule Take 0.4 mg by mouth at bedtime.   No facility-administered encounter medications on file as of 02/17/2022.    Review of Systems  Constitutional:  Negative for activity change, appetite change and fever.  HENT:  Negative for sore throat.   Eyes: Negative.   Cardiovascular:  Negative for chest pain and leg swelling.  Gastrointestinal:  Negative for abdominal distention, diarrhea and vomiting.  Genitourinary:  Negative for dysuria, frequency and urgency.  Skin:  Negative for color change.  Neurological:  Negative for dizziness and headaches.  Psychiatric/Behavioral:  Negative for behavioral problems and sleep disturbance. The patient is not nervous/anxious.        Immunization History  Administered Date(s) Administered   Influenza-Unspecified 06/11/2015, 06/11/2019, 06/19/2020, 06/27/2021   Moderna Sars-Covid-2 Vaccination 09/10/2019, 10/08/2019, 07/17/2020, 02/05/2021   PFIZER(Purple Top)SARS-COV-2 Vaccination 09/10/2019   Pneumococcal Conjugate-13 09/12/2014   Pneumococcal Polysaccharide-23 04/21/2011   Pneumococcal-Unspecified 04/21/2019   Td 04/22/2002   Tetanus 05/16/2020   Unspecified SARS-COV-2 Vaccination 05/28/2021   Zoster Recombinat (Shingrix) 09/14/2021, 11/25/2021   Zoster, Live 04/27/2012   Pertinent  Health Maintenance Due  Topic Date Due   INFLUENZA VACCINE  04/08/2022      07/28/2016    3:05 PM 02/08/2018    1:54 PM 03/24/2019    3:10 PM  Fall Risk  Falls in the past  year? Yes Yes 0  Was there an injury with Fall? No No 0  Fall Risk Category Calculator   0  Fall Risk Category   Low  Patient Fall Risk Level   Low fall risk  Patient at Risk for Falls Due to History of fall(s);Impaired balance/gait;Impaired mobility    Fall risk Follow up Falls evaluation completed       Vitals:   02/17/22 1421  BP: 118/64  Pulse: 76  Resp: 18  Temp: (!) 97.4 F (36.3 C)  SpO2: 95%  Weight: 198 lb (89.8 kg)  Height: '5\' 4"'$  (1.626 m)   Body mass index is 33.99 kg/m.  Physical Exam Constitutional:      General: He is not in acute distress.    Appearance: He is obese.  HENT:     Head: Normocephalic and atraumatic.     Mouth/Throat:     Mouth: Mucous membranes are moist.  Eyes:     Conjunctiva/sclera: Conjunctivae normal.  Cardiovascular:     Rate and Rhythm: Normal rate and regular rhythm.     Pulses: Normal pulses.     Heart sounds: Normal heart sounds.  Pulmonary:     Effort: Pulmonary effort is normal.     Breath sounds: Normal breath sounds.  Abdominal:     General: Bowel sounds are normal.     Palpations: Abdomen is soft.  Musculoskeletal:        General: No swelling. Normal range of motion.     Cervical back: Normal range of motion.  Skin:    General: Skin is warm and dry.  Neurological:     General: No focal deficit present.     Mental Status: He is alert and oriented to person, place, and time.     Comments: Alert to self and place, disoriented to time.  Psychiatric:        Mood and Affect: Mood normal.        Behavior: Behavior normal.        Thought Content: Thought content normal.        Judgment: Judgment normal.        Labs reviewed: Recent Labs    10/22/21 0000 01/16/22 0000 02/06/22 0000  NA 142 144 140  K 4.3 4.1 4.5  CL 107 105 105  CO2 29* 29* 29*  BUN 21 26* 18  CREATININE 1.0 1.1 1.0  CALCIUM 8.6* 8.6* 8.8   Recent Labs    03/26/21 0000 04/23/21 0000  AST 18 18  ALT 14 14  ALKPHOS 91 87  ALBUMIN 3.9  3.8   Recent Labs    03/26/21 0000 04/23/21 0000 02/06/22 0000  WBC 4.3 4.6 5.5  NEUTROABS 2,786.00 2,870.00  --   HGB 13.5 13.4* 14.1  HCT 42 41 44  PLT 173 173 143*   Lab Results  Component Value Date   TSH 0.98 02/06/2022   No results found for: "HGBA1C" Lab Results  Component Value Date   CHOL 114 05/31/2021   HDL 25 (A) 05/31/2021   LDLCALC 66 05/31/2021   TRIG 149 05/31/2021   CHOLHDL 5.1 (H) 03/31/2019    Significant Diagnostic Results in last 30 days:  No results found.  Assessment/Plan  1. Generalized weakness -  continue assistance as needed -  fall precautions  2. Acquired hypothyroidism Lab Results  Component Value Date   TSH 0.98 02/06/2022   -  continue Levothyroxine  3. Idiopathic peripheral neuropathy -  stable, continue Gabapentin  4. Moderate episode of recurrent major depressive disorder ([place]) -  continue Zoloft and Zyprexa     Family/ staff Communication: Discussed plan of care with resident and charge nurse  Labs/tests ordered:   None    Durenda Age, DNP, MSN, FNP-BC [facility] [phone] (Monday-Friday 8:00 a.m. - 5:00 p.m.) [phone] (after hours)

## 2022-02-19 DIAGNOSIS — W19XXXA Unspecified fall, initial encounter: Secondary | ICD-10-CM | POA: Diagnosis not present

## 2022-02-19 DIAGNOSIS — R0781 Pleurodynia: Secondary | ICD-10-CM | POA: Diagnosis not present

## 2022-02-20 ENCOUNTER — Encounter: Payer: Self-pay | Admitting: Adult Health

## 2022-02-20 ENCOUNTER — Non-Acute Institutional Stay (SKILLED_NURSING_FACILITY): Payer: Medicare PPO | Admitting: Adult Health

## 2022-02-20 DIAGNOSIS — R278 Other lack of coordination: Secondary | ICD-10-CM | POA: Diagnosis not present

## 2022-02-20 DIAGNOSIS — D72829 Elevated white blood cell count, unspecified: Secondary | ICD-10-CM

## 2022-02-20 DIAGNOSIS — D696 Thrombocytopenia, unspecified: Secondary | ICD-10-CM | POA: Diagnosis not present

## 2022-02-20 DIAGNOSIS — I872 Venous insufficiency (chronic) (peripheral): Secondary | ICD-10-CM | POA: Diagnosis not present

## 2022-02-20 DIAGNOSIS — G609 Hereditary and idiopathic neuropathy, unspecified: Secondary | ICD-10-CM

## 2022-02-20 DIAGNOSIS — R600 Localized edema: Secondary | ICD-10-CM

## 2022-02-20 DIAGNOSIS — N179 Acute kidney failure, unspecified: Secondary | ICD-10-CM

## 2022-02-20 DIAGNOSIS — R410 Disorientation, unspecified: Secondary | ICD-10-CM | POA: Diagnosis not present

## 2022-02-20 DIAGNOSIS — R4189 Other symptoms and signs involving cognitive functions and awareness: Secondary | ICD-10-CM | POA: Diagnosis not present

## 2022-02-20 DIAGNOSIS — F03B3 Unspecified dementia, moderate, with mood disturbance: Secondary | ICD-10-CM | POA: Diagnosis not present

## 2022-02-20 DIAGNOSIS — R1312 Dysphagia, oropharyngeal phase: Secondary | ICD-10-CM | POA: Diagnosis not present

## 2022-02-20 DIAGNOSIS — E039 Hypothyroidism, unspecified: Secondary | ICD-10-CM | POA: Diagnosis not present

## 2022-02-20 DIAGNOSIS — F331 Major depressive disorder, recurrent, moderate: Secondary | ICD-10-CM | POA: Diagnosis not present

## 2022-02-20 DIAGNOSIS — R41841 Cognitive communication deficit: Secondary | ICD-10-CM | POA: Diagnosis not present

## 2022-02-20 DIAGNOSIS — R29898 Other symptoms and signs involving the musculoskeletal system: Secondary | ICD-10-CM | POA: Diagnosis not present

## 2022-02-20 LAB — BASIC METABOLIC PANEL
BUN: 24 — AB (ref 4–21)
CO2: 25 — AB (ref 13–22)
Chloride: 104 (ref 99–108)
Creatinine: 1.2 (ref 0.6–1.3)
Glucose: 130
Potassium: 4.1 mEq/L (ref 3.5–5.1)
Sodium: 141 (ref 137–147)

## 2022-02-20 LAB — CBC AND DIFFERENTIAL
HCT: 44 (ref 41–53)
Hemoglobin: 14.8 (ref 13.5–17.5)
Neutrophils Absolute: 11553
Platelets: 117 10*3/uL — AB (ref 150–400)
WBC: 12.2

## 2022-02-20 LAB — COMPREHENSIVE METABOLIC PANEL
Calcium: 8.8 (ref 8.7–10.7)
eGFR: 59

## 2022-02-20 LAB — CBC: RBC: 5.14 — AB (ref 3.87–5.11)

## 2022-02-20 NOTE — Progress Notes (Addendum)
Location:  [place] Room Number: 16 A Place of Service:  SNF (31) Provider:  Durenda Age, DNP, FNP-BC  Patient Care Team: Mast, Man X, NP as PCP - General (Internal Medicine) [name], [name], MD as Consulting Physician (Orthopedic Surgery) [name], MD as Consulting Physician (Gastroenterology) System, Provider Not In [name], MD as Consulting Physician (Neurosurgery) [name], MD as Referring Physician (Ophthalmology) Mast, Man X, NP as Nurse Practitioner (Internal Medicine) Almedia Balls, MD as Referring Physician (Orthopedic Surgery) [name], MD as Consulting Physician (Neurosurgery) Elsie Saas, MD as Consulting Physician (Orthopedic Surgery) Allyn Kenner, MD (Dermatology) Kathrynn Ducking, MD (Inactive) as Consulting Physician (Neurology) Franchot Gallo, MD as Consulting Physician (Urology)  Extended Emergency Contact Information Primary Emergency Contact: [name] Address: [address]          [place], [place] [address] [place] Phone: [phone] Relation: Spouse  Code Status:  Full  Goals of care: Advanced Directive information    02/20/2022    3:24 PM  Advanced Directives  Does Patient Have a Medical Advance Directive? Yes  Type of Paramedic of [place];Living will;Out of facility DNR (pink MOST or yellow form)  Does patient want to make changes to medical advance directive? No - Patient declined  Copy of [place] in Chart? Yes - validated most recent copy scanned in chart (See row information)  Pre-existing out of facility DNR order (yellow form or pink MOST form) Pink MOST form placed in chart (order not valid for inpatient use)     Chief Complaint  Patient presents with   Acute Visit    Patient is being seen for low grade fever and low food intake    HPI:  Pt is a 88 y.o. male seen today  for an acute visit. He was reported to have low grade fever 100.5. PRN Acetaminophen was given. This morning Temperature 97.4, HR 99, BP 100/50, RR 16. He was reported to have poor oral intake. He did not eat breakfast nor lunch today. Chest x-ray done yesterday was negative for acute process, no pneumonia. Stat Labs showed: glucose 130, BUN 24, creatinine 1.19, GFR 59, Na 141, K 4.1, WBC 12.2, platelet 117. Urinalysis was ordered and staff reported that urine was dark. IV of 0.9 NS was started and repeat VS: BP 120/64, RR20, Temp 98.9, HR 88, and O2 sat 94% on room air. He was transferred today from AL to SNF.   Past Medical History:  Diagnosis Date   Abnormal MRI, knee    Per [facility] New Patient Packet    Anemia    Anticoagulated    Anxiety    Arthritis    Cervical disc disorder    Per [facility] New Patient Packet    Complication of anesthesia    states "[place] was [place] several weeks after surgery"caused by tramadol   Constipation    Dysuria 07/28/2016   Edema 07/24/2016   Gait abnormality 07/25/2016   Hard of hearing    Head injury    As a result of a fall, Per [facility] New Patient Packet    Hip arthritis 07/21/2016   History of fall 07/25/2016   Hypercholesteremia    Hypertension    Lumbar spinal stenosis    Memory loss 07/28/2016   mild   Obesity    Per [facility] New Patient Packet  Osteoarthritis of right hip 07/24/2016   Peripheral neuropathy 03/10/2018   Sleep apnea    cpap   Spinal stenosis of lumbar region 05/28/2016   Urinary frequency    Venous thrombosis of leg 07/24/2016   right gastrocnemius   Vertigo    Wears glasses    Weight loss 04/08/2016   Past Surgical History:  Procedure Laterality Date   BACK SURGERY  10/17, 80's   COLONOSCOPY     COLONOSCOPY     Per [name] Patient Packet, Dr.Schooler    ESOPHAGOGASTRODUODENOSCOPY     LUMBAR LAMINECTOMY/DECOMPRESSION MICRODISCECTOMY Right 05/28/2016   Procedure: Right Lumbar Three-Four Microdiscectomy;  Surgeon: [name],  MD;  Location: [place] NEURO ORS;  Service: Neurosurgery;  Laterality: Right;   TOTAL HIP ARTHROPLASTY Right 07/21/2016   Procedure: TOTAL HIP ARTHROPLASTY ANTERIOR APPROACH;  Surgeon: [name], MD;  Location: [place];  Service: Orthopedics;  Laterality: Right;    Allergies  Allergen Reactions   Tramadol Other (See Comments)    "does not eat" LOSS OF APPETITE    Outpatient Encounter Medications as of 02/20/2022  Medication Sig   acetaminophen (TYLENOL) 500 MG tablet Take 1,000 mg by mouth as needed.   aspirin EC 81 MG tablet Take 81 mg by mouth daily. 8am.   atorvastatin (LIPITOR) 20 MG tablet Take 20 mg by mouth daily. 8pm.   Coenzyme Q10 (COQ10) 100 MG CAPS Take 1 capsule by mouth daily. 8am.   Cyanocobalamin (B-12) 1000 MCG TABS Take 1 tablet by mouth every other day. 8am.   fexofenadine (ALLEGRA) 180 MG tablet Take 180 mg by mouth daily as needed for allergies or rhinitis.   fluticasone (FLONASE) 50 MCG/ACT nasal spray Place 2 sprays into both nostrils as needed.    furosemide (LASIX) 20 MG tablet Take 20 mg by mouth daily.   gabapentin (NEURONTIN) 100 MG capsule Take 200 mg by mouth at bedtime. 8pm.   Glucosamine 500 MG CAPS Take by mouth. Take 3 pills to equal '1500mg'$  daily.   hydrocortisone 2.5 % cream Apply topically 2 (two) times daily as needed.   hydrOXYzine (VISTARIL) 25 MG capsule Take 25 mg by mouth 2 (two) times daily as needed for anxiety.   levothyroxine (SYNTHROID) 75 MCG tablet Take 75 mcg by mouth daily before breakfast.   meclizine (ANTIVERT) 25 MG tablet Take 25 mg by mouth 3 (three) times daily as needed for dizziness.   meloxicam (MOBIC) 15 MG tablet Take 15 mg by mouth daily.    Menthol, Topical Analgesic, (BIOFREEZE) 4 % GEL Apply topically in the morning, at noon, in the evening, and at bedtime.   NAT-RUL PSYLLIUM SEED HUSKS PO Take 1 tablet by mouth daily. For constipation   nystatin (MYCOSTATIN/NYSTOP) powder Apply 1 application topically every other day.    OLANZapine (ZYPREXA) 2.5 MG tablet Take 2.5 mg by mouth at bedtime.   omeprazole (PRILOSEC) 20 MG capsule Take 20 mg by mouth daily.   potassium chloride SA (KLOR-CON M) 20 MEQ tablet Take 20 mEq by mouth daily.   sennosides-docusate sodium (SENOKOT-S) 8.6-50 MG tablet Take 1 tablet by mouth daily.   sertraline (ZOLOFT) 50 MG tablet Take 50 mg by mouth daily. Administer along with '25mg'$  tab to equal '75mg'$  daily   tamsulosin (FLOMAX) 0.4 MG CAPS capsule Take 0.4 mg by mouth at bedtime.   No facility-administered encounter medications on file as of 02/20/2022.    Review of Systems  Constitutional:  Positive for appetite change and fever. Negative for  activity change.       Poor appetite.  HENT:  Negative for sore throat.   Eyes: Negative.   Cardiovascular:  Negative for chest pain and leg swelling.  Gastrointestinal:  Negative for abdominal distention, diarrhea and vomiting.  Genitourinary:  Negative for dysuria, frequency and urgency.  Skin:  Negative for color change.  Neurological:  Positive for weakness. Negative for dizziness and headaches.  Psychiatric/Behavioral:  Positive for confusion. Negative for behavioral problems and sleep disturbance. The patient is not nervous/anxious.        Immunization History  Administered Date(s) Administered   Influenza-Unspecified 06/11/2015, 06/11/2019, 06/19/2020, 06/27/2021   Moderna Sars-Covid-2 Vaccination 09/10/2019, 10/08/2019, 07/17/2020, 02/05/2021   PFIZER(Purple Top)SARS-COV-2 Vaccination 09/10/2019   Pneumococcal Conjugate-13 09/12/2014   Pneumococcal Polysaccharide-23 04/21/2011   Pneumococcal-Unspecified 04/21/2019   Td 04/22/2002   Tetanus 05/16/2020   Unspecified SARS-COV-2 Vaccination 05/28/2021   Zoster Recombinat (Shingrix) 09/14/2021, 11/25/2021   Zoster, Live 04/27/2012   Pertinent  Health Maintenance Due  Topic Date Due   INFLUENZA VACCINE  04/08/2022      07/28/2016    3:05 PM 02/08/2018    1:54 PM 03/24/2019     3:10 PM  Fall Risk  Falls in the past year? Yes Yes 0  Was there an injury with Fall? No No 0  Fall Risk Category Calculator   0  Fall Risk Category   Low  Patient Fall Risk Level   Low fall risk  Patient at Risk for Falls Due to History of fall(s);Impaired balance/gait;Impaired mobility    Fall risk Follow up Falls evaluation completed       Vitals:   02/20/22 1520  BP: (!) 100/50  Pulse: 97  Resp: 16  Temp: 98.4 F (36.9 C)  SpO2: (!) 86%  Weight: 198 lb (89.8 kg)  Height: '5\' 4"'$  (1.626 m)   Body mass index is 33.99 kg/m.  Physical Exam Constitutional:      General: He is not in acute distress.    Appearance: He is obese.  HENT:     Head: Normocephalic and atraumatic.     Mouth/Throat:     Mouth: Mucous membranes are moist.  Eyes:     Conjunctiva/sclera: Conjunctivae normal.  Cardiovascular:     Rate and Rhythm: Normal rate and regular rhythm.     Pulses: Normal pulses.     Heart sounds: Normal heart sounds.  Pulmonary:     Effort: Pulmonary effort is normal.     Breath sounds: Normal breath sounds.  Abdominal:     General: Bowel sounds are normal.     Palpations: Abdomen is soft.  Musculoskeletal:        General: Swelling present. Normal range of motion.     Cervical back: Normal range of motion.     Comments: BLE 1-2+edema  Skin:    General: Skin is warm and dry.  Neurological:     Mental Status: Mental status is at baseline. He is disoriented.     Motor: Weakness present.  Psychiatric:        Mood and Affect: Mood normal.        Behavior: Behavior normal.        Labs reviewed: Recent Labs    10/22/21 0000 01/16/22 0000 02/06/22 0000  NA 142 144 140  K 4.3 4.1 4.5  CL 107 105 105  CO2 29* 29* 29*  BUN 21 26* 18  CREATININE 1.0 1.1 1.0  CALCIUM 8.6* 8.6* 8.8   Recent Labs  03/26/21 0000 04/23/21 0000  AST 18 18  ALT 14 14  ALKPHOS 91 87  ALBUMIN 3.9 3.8   Recent Labs    03/26/21 0000 04/23/21 0000 02/06/22 0000  WBC 4.3  4.6 5.5  NEUTROABS 2,786.00 2,870.00  --   HGB 13.5 13.4* 14.1  HCT 42 41 44  PLT 173 173 143*   Lab Results  Component Value Date   TSH 0.98 02/06/2022   No results found for: "HGBA1C" Lab Results  Component Value Date   CHOL 114 05/31/2021   HDL 25 (A) 05/31/2021   LDLCALC 66 05/31/2021   TRIG 149 05/31/2021   CHOLHDL 5.1 (H) 03/31/2019    Significant Diagnostic Results in last 30 days:  No results found.  Assessment/Plan  1. AKI (acute kidney injury) ([place]) -  due to poor oral intake -  will start IVF 0.9NS at 70 ml/hour via PIV X 2L -  will hold Lasix in AM  2. Leukocytosis, unspecified type -  wbc 12.2, elevated -  probably due to UTI, urine specimen for urinalysis was collected and noted to be dark -  will start Rocephin 1 gm IM daily X 7 days and Florastor 250 mg BID X 10 days  3. Idiopathic peripheral neuropathy -  staff reported that he was sleepy in the afternoon -  will hold Gabapentin and Zyprexa tonight and Zoloft in the morning  4. Moderate episode of recurrent major depressive disorder (HCC) -  will hold Zoloft in the morning due to being sleepy  5. Acquired hypothyroidism -  continue Levothyroxine  6. Edema of both lower legs due to peripheral venous insufficiency -  will hold Lasix due to AKI  7. Cognitive impairment -  has moderate cognitive impairment -   continue supportive care   Family/ staff Communication: Discussed plan of care with resident and charge nurse  Labs/tests ordered:    CBC with differentials, BMP with GFR, urinalysis with culture and sensitivity    Durenda Age, DNP, MSN, FNP-BC [facility] [phone] (Monday-Friday 8:00 a.m. - 5:00 p.m.) [phone] (after hours)

## 2022-02-21 NOTE — Addendum Note (Signed)
Addended by: Durenda Age C on: 02/21/2022 12:44 PM   Modules accepted: Level of Service

## 2022-02-24 LAB — CBC AND DIFFERENTIAL
HCT: 37 — AB (ref 41–53)
Hemoglobin: 12.1 — AB (ref 13.5–17.5)
Neutrophils Absolute: 7378
Platelets: 81 10*3/uL — AB (ref 150–400)
WBC: 8.5

## 2022-02-24 LAB — BASIC METABOLIC PANEL
BUN: 17 (ref 4–21)
CO2: 27 — AB (ref 13–22)
Chloride: 109 — AB (ref 99–108)
Creatinine: 0.7 (ref 0.6–1.3)
Glucose: 102
Potassium: 3.8 mEq/L (ref 3.5–5.1)
Sodium: 141 (ref 137–147)

## 2022-02-24 LAB — CBC: RBC: 4.16 (ref 3.87–5.11)

## 2022-02-24 LAB — COMPREHENSIVE METABOLIC PANEL
Calcium: 7.6 — AB (ref 8.7–10.7)
eGFR: 90

## 2022-02-25 ENCOUNTER — Non-Acute Institutional Stay (SKILLED_NURSING_FACILITY): Payer: Medicare PPO | Admitting: Internal Medicine

## 2022-02-25 ENCOUNTER — Encounter: Payer: Self-pay | Admitting: Internal Medicine

## 2022-02-25 DIAGNOSIS — D696 Thrombocytopenia, unspecified: Secondary | ICD-10-CM

## 2022-02-25 DIAGNOSIS — E039 Hypothyroidism, unspecified: Secondary | ICD-10-CM

## 2022-02-25 DIAGNOSIS — I872 Venous insufficiency (chronic) (peripheral): Secondary | ICD-10-CM | POA: Diagnosis not present

## 2022-02-25 DIAGNOSIS — G609 Hereditary and idiopathic neuropathy, unspecified: Secondary | ICD-10-CM | POA: Diagnosis not present

## 2022-02-25 DIAGNOSIS — F331 Major depressive disorder, recurrent, moderate: Secondary | ICD-10-CM | POA: Diagnosis not present

## 2022-02-25 DIAGNOSIS — R600 Localized edema: Secondary | ICD-10-CM

## 2022-02-25 DIAGNOSIS — R410 Disorientation, unspecified: Secondary | ICD-10-CM | POA: Diagnosis not present

## 2022-02-25 DIAGNOSIS — F03B3 Unspecified dementia, moderate, with mood disturbance: Secondary | ICD-10-CM

## 2022-02-25 NOTE — Progress Notes (Signed)
Provider:   Location:  [facility] Room Number: 16 Place of Service:  SNF (31)  PCP: Mast, Man X, NP Patient Care Team: Mast, Man X, NP as PCP - General (Internal Medicine) [name], [name], MD as Consulting Physician (Orthopedic Surgery) [name], MD as Consulting Physician (Gastroenterology) System, Provider Not In [name], MD as Consulting Physician (Neurosurgery) [name], MD as Referring Physician (Ophthalmology) Mast, Man X, NP as Nurse Practitioner (Internal Medicine) Almedia Balls, MD as Referring Physician (Orthopedic Surgery) [name], MD as Consulting Physician (Neurosurgery) Elsie Saas, MD as Consulting Physician (Orthopedic Surgery) Allyn Kenner, MD (Dermatology) Kathrynn Ducking, MD (Inactive) as Consulting Physician (Neurology) Franchot Gallo, MD as Consulting Physician (Urology)  Extended Emergency Contact Information Primary Emergency Contact: [name] Address: [address]          [place], [place] [address] [place] Phone: [phone] Relation: Spouse Secondary Emergency Contact: [place] Mobile Phone: [phone] Relation: Relative  Code Status: DNR Goals of Care: Advanced Directive information    02/20/2022    3:24 PM  Advanced Directives  Does Patient Have a Medical Advance Directive? Yes  Type of Paramedic of [place];Living will;Out of facility DNR (pink MOST or yellow form)  Does patient want to make changes to medical advance directive? No - Patient declined  Copy of [place] in Chart? Yes - validated most recent copy scanned in chart (See row information)  Pre-existing out of facility DNR order (yellow form or pink MOST form) Pink MOST form placed in chart (order not valid for inpatient use)      Chief Complaint  Patient presents with   New Admit To SNF    Admission to SNF     HPI: Patient is a 88 y.o. male seen today for admission to SNF For higher level of care  Patient has h/o Peripheral Neuropathy Idiopathic uses wheel chair BPH with urinary Frequency,  LE edema,  Arthritis, Sleep Apnea, Hyperlipidemia Cognitive impairment H/o BPPV  Noticed to have acute decline in his Mental status since he has [place] from [place] More confused  Unable to do his ADLS Did not know where he is this is new for him Does not remember being in [place] and now SNF Can't  do his transfers which is new also. Not standing anymore He was with his wife before and keeps saying her name. Looking for her Told therapy he thinks she is Pregnant and told me that she Is sick and needs to be seen Poor Appetite   Past Medical History:  Diagnosis Date   Abnormal MRI, knee    Per [facility] New Patient Packet    Anemia    Anticoagulated    Anxiety    Arthritis    Cervical disc disorder    Per [facility] New Patient Packet    Complication of anesthesia    states "[place] was [place] after surgery"caused by tramadol   Constipation    Dysuria 07/28/2016   Edema 07/24/2016   Gait abnormality 07/25/2016   Hard of hearing    Head injury    As a result of a fall, Per [facility] New Patient Packet    Hip arthritis 07/21/2016   History of fall 07/25/2016   Hypercholesteremia    Hypertension    Lumbar spinal stenosis    Memory loss 07/28/2016  mild   Obesity    Per [facility] New Patient Packet    Osteoarthritis of right hip 07/24/2016   Peripheral neuropathy 03/10/2018   Sleep apnea    cpap   Spinal stenosis of lumbar region 05/28/2016   Urinary frequency    Venous thrombosis of leg 07/24/2016   right gastrocnemius   Vertigo    Wears glasses    Weight loss 04/08/2016   Past Surgical History:  Procedure Laterality Date   BACK SURGERY  10/17, 80's   COLONOSCOPY     COLONOSCOPY     Per [facility] Patient Packet, Dr.Schooler    ESOPHAGOGASTRODUODENOSCOPY     LUMBAR  LAMINECTOMY/DECOMPRESSION MICRODISCECTOMY Right 05/28/2016   Procedure: Right Lumbar Three-Four Microdiscectomy;  Surgeon: [name], MD;  Location: [facility];  Service: Neurosurgery;  Laterality: Right;   TOTAL HIP ARTHROPLASTY Right 07/21/2016   Procedure: TOTAL HIP ARTHROPLASTY ANTERIOR APPROACH;  Surgeon: [name], MD;  Location: [facility];  Service: Orthopedics;  Laterality: Right;    reports that he quit smoking about 53 years ago. His smoking use included cigarettes. He has a 4.00 pack-year smoking history. He has never used smokeless tobacco. He reports that he does not drink alcohol and does not use drugs. Social History   Socioeconomic History   Marital status: Married    Spouse name: Not on file   Number of children: 2   Years of education: MS   Highest education level: Not on file  Occupational History   Not on file  Tobacco Use   Smoking status: Former    Packs/day: 1.00    Years: 4.00    Total pack years: 4.00    Types: Cigarettes    Quit date: 07/16/1968    Years since quitting: 53.6   Smokeless tobacco: Never   Tobacco comments:    quit 50 years ago  Vaping Use   Vaping Use: Never used  Substance and Sexual Activity   Alcohol use: No   Drug use: No   Sexual activity: Not Currently    Partners: Female  Other Topics Concern   Not on file  Social History Narrative   Lives w/ wife   Caffeine use: Coffee daily   Right handed    Retired. Prior rehab counselor. Resident at [facility] since about 2001.      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 Yes   Do you have difficulty preparing food or eating? Yes   Do you have difficulty managing your medications? Yes   Do you have difficulty managing your finances? Yes   Do you have difficulty affording your medications? No   Social Determinants of Health   Financial Resource Strain: Not on file  Food Insecurity: Not on file  Transportation Needs: Not on file  Physical Activity: Not on  file  Stress: Not on file  Social Connections: Not on file  Intimate Partner Violence: Not on file    Functional Status Survey:    Family History  Problem Relation Age of Onset   Heart disease Mother    Transient ischemic attack Mother    Kidney failure Father    Alzheimer's disease Sister    AAA (abdominal aortic aneurysm) Sister    Lung cancer Brother    Memory loss Sister    Memory loss Sister    Heart disease Sister    Cancer Sister    Diabetes Sister    Cancer Sister     Health Maintenance  Topic Date Due   COVID-19 Vaccine (7 - Mixed Product series) 09/27/2021   INFLUENZA VACCINE  04/08/2022   TETANUS/TDAP  05/16/2030   Pneumonia Vaccine 85+ Years old  Completed   Zoster Vaccines- Shingrix  Completed   HPV VACCINES  Aged Out    Allergies  Allergen Reactions   Tramadol Other (See Comments)    "does not eat" LOSS OF APPETITE    Outpatient Encounter Medications as of 02/25/2022  Medication Sig   acetaminophen (TYLENOL) 500 MG tablet Take 1,000 mg by mouth as needed.   aspirin EC 81 MG tablet Take 81 mg by mouth daily. 8am.   atorvastatin (LIPITOR) 20 MG tablet Take 20 mg by mouth daily. 8pm.   Coenzyme Q10 (COQ10) 100 MG CAPS Take 1 capsule by mouth daily. 8am.   Cyanocobalamin (B-12) 1000 MCG TABS Take 1 tablet by mouth every other day. 8am.   fexofenadine (ALLEGRA) 180 MG tablet Take 180 mg by mouth daily as needed for allergies or rhinitis.   fluticasone (FLONASE) 50 MCG/ACT nasal spray Place 2  sprays into both nostrils as needed.    furosemide (LASIX) 20 MG tablet Take 20 mg by mouth daily.   gabapentin (NEURONTIN) 100 MG capsule Take 200 mg by mouth at bedtime. 8pm.   Glucosamine 500 MG CAPS Take by mouth. Take 3 pills to equal '1500mg'$  daily.   hydrocortisone 2.5 % cream Apply topically 2 (two) times daily as needed.   hydrOXYzine (VISTARIL) 25 MG capsule Take 25 mg by mouth 2 (two) times daily as needed for anxiety.   lactose free nutrition (BOOST) LIQD Take 237 mLs by mouth daily.   levothyroxine (SYNTHROID) 75 MCG tablet Take 75 mcg by mouth daily before breakfast.   meclizine (ANTIVERT) 25 MG tablet Take 25 mg by mouth 3 (three) times daily as needed for dizziness.   Menthol, Topical Analgesic, (BIOFREEZE) 4 % GEL Apply topically in the morning, at noon, in the evening, and at bedtime.   NAT-RUL PSYLLIUM SEED HUSKS PO Take 1 tablet by mouth daily. For constipation   nystatin (MYCOSTATIN/NYSTOP) powder Apply 1 application topically every other day.   omeprazole (PRILOSEC) 20 MG capsule Take 20 mg by mouth daily.   potassium chloride SA (KLOR-CON M) 20 MEQ tablet Take 20 mEq by mouth daily.   saccharomyces boulardii (FLORASTOR) 250 MG capsule Take 250 mg by mouth 2 (two) times daily.   sennosides-docusate sodium (SENOKOT-S) 8.6-50 MG tablet Take 1 tablet by mouth daily.   sertraline (ZOLOFT) 50 MG tablet Take 50 mg by mouth daily. Administer along with '25mg'$  tab to equal '75mg'$  daily   tamsulosin (FLOMAX) 0.4 MG CAPS capsule Take 0.4 mg by mouth at bedtime.   [DISCONTINUED] meloxicam (MOBIC) 15 MG tablet Take 15 mg by mouth daily.    [DISCONTINUED] OLANZapine (ZYPREXA) 2.5 MG tablet  Take 2.5 mg by mouth at bedtime.   No facility-administered encounter medications on file as of 02/25/2022.    Review of Systems  Constitutional:  Positive for activity change and appetite change. Negative for unexpected weight change.  HENT: Negative.    Respiratory:  Negative for cough and shortness  of breath.   Cardiovascular:  Negative for leg swelling.  Gastrointestinal:  Negative for constipation.  Genitourinary:  Negative for frequency.  Musculoskeletal:  Positive for gait problem and myalgias. Negative for arthralgias.  Skin: Negative.  Negative for rash.  Neurological:  Positive for weakness. Negative for dizziness.  Psychiatric/Behavioral:  Positive for confusion and dysphoric mood. Negative for sleep disturbance.   All other systems reviewed and are negative.   Vitals:   02/25/22 1548  BP: 120/60  Pulse: 84  Resp: 18  Temp: 97.6 F (36.4 C)  SpO2: 99%  Weight: 184 lb 1.6 oz (83.5 kg)  Height: '5\' 9"'$  (1.753 m)   Body mass index is 27.19 kg/m. Physical Exam Vitals reviewed.  Constitutional:      Appearance: Normal appearance.  HENT:     Head: Normocephalic.     Nose: Nose normal.     Mouth/Throat:     Mouth: Mucous membranes are moist.     Pharynx: Oropharynx is clear.  Eyes:     Pupils: Pupils are equal, round, and reactive to light.  Cardiovascular:     Rate and Rhythm: Normal rate and regular rhythm.     Pulses: Normal pulses.     Heart sounds: No murmur heard. Pulmonary:     Effort: Pulmonary effort is normal. No respiratory distress.     Breath sounds: Normal breath sounds. No rales.  Abdominal:     General: Abdomen is flat. Bowel sounds are normal.     Palpations: Abdomen is soft.  Musculoskeletal:        General: Swelling present.     Cervical back: Neck supple.  Skin:    General: Skin is warm.  Neurological:     General: No focal deficit present.     Mental Status: He is alert.     Comments: Very confused Oriented to himself only Is able to follow commands  Psychiatric:        Mood and Affect: Mood normal.        Thought Content: Thought content normal.     Labs reviewed: Basic Metabolic Panel: Recent Labs    10/22/21 0000 01/16/22 0000 02/06/22 0000  NA 142 144 140  K 4.3 4.1 4.5  CL 107 105 105  CO2 29* 29* 29*  BUN 21 26*  18  CREATININE 1.0 1.1 1.0  CALCIUM 8.6* 8.6* 8.8   Liver Function Tests: Recent Labs    03/26/21 0000 04/23/21 0000  AST 18 18  ALT 14 14  ALKPHOS 91 87  ALBUMIN 3.9 3.8   No results for input(s): "LIPASE", "AMYLASE" in the last 8760 hours. No results for input(s): "AMMONIA" in the last 8760 hours. CBC: Recent Labs    03/26/21 0000 04/23/21 0000 02/06/22 0000  WBC 4.3 4.6 5.5  NEUTROABS 2,786.00 2,870.00  --   HGB 13.5 13.4* 14.1  HCT 42 41 44  PLT 173 173 143*   Cardiac Enzymes: No results for input(s): "CKTOTAL", "CKMB", "CKMBINDEX", "TROPONINI" in the last 8760 hours. BNP: Invalid input(s): "POCBNP" No results found for: "HGBA1C" Lab Results  Component Value Date   TSH 0.98 02/06/2022   Lab Results  Component Value Date  VITAMINB12 >2,000 (H) 03/31/2019   No results found for: "FOLATE" No results found for: "IRON", "TIBC", "FERRITIN"  Imaging and Procedures obtained prior to SNF admission: CT Head Wo Contrast  Result Date: 05/25/2017 CLINICAL DATA:  Fall, posterior head laceration, on anti coagulation EXAM: CT HEAD WITHOUT CONTRAST CT CERVICAL SPINE WITHOUT CONTRAST TECHNIQUE: Multidetector CT imaging of the head and cervical spine was performed following the standard protocol without intravenous contrast. Multiplanar CT image reconstructions of the cervical spine were also generated. COMPARISON:  None. FINDINGS: CT HEAD FINDINGS Brain: No evidence of acute infarction, hemorrhage, hydrocephalus, extra-axial collection or mass lesion/mass effect. Mild cortical atrophy. Subcortical white matter and periventricular small vessel ischemic changes. Vascular: Intracranial atherosclerosis. Skull: Normal. Negative for fracture or focal lesion. Sinuses/Orbits: The visualized paranasal sinuses are essentially clear. The mastoid air cells are unopacified. Other: Soft tissue swelling/hematoma overlying the right posterior vertex (series 4/ image 26). CT CERVICAL SPINE FINDINGS  Alignment: Normal cervical lordosis. Skull base and vertebrae: No acute fracture. No primary bone lesion or focal pathologic process. Soft tissues and spinal canal: No prevertebral fluid or swelling. No visible canal hematoma. Disc levels: Mild degenerative changes of the mid/lower cervical spine. Spinal canal is patent. Upper chest: Visualized lung apices are clear. Other: Visualized thyroid is unremarkable. IMPRESSION: Soft tissue swelling/hematoma overlying the right posterior vertex. No evidence of calvarial fracture. No evidence of acute intracranial abnormality. Mild atrophy with small vessel ischemic changes. No evidence of traumatic injury to the cervical spine. Mild degenerative changes. Electronically Signed   By: Julian Hy M.D.   On: 05/25/2017 13:44   CT Cervical Spine Wo Contrast  Result Date: 05/25/2017 CLINICAL DATA:  Fall, posterior head laceration, on anti coagulation EXAM: CT HEAD WITHOUT CONTRAST CT CERVICAL SPINE WITHOUT CONTRAST TECHNIQUE: Multidetector CT imaging of the head and cervical spine was performed following the standard protocol without intravenous contrast. Multiplanar CT image reconstructions of the cervical spine were also generated. COMPARISON:  None. FINDINGS: CT HEAD FINDINGS Brain: No evidence of acute infarction, hemorrhage, hydrocephalus, extra-axial collection or mass lesion/mass effect. Mild cortical atrophy. Subcortical white matter and periventricular small vessel ischemic changes. Vascular: Intracranial atherosclerosis. Skull: Normal. Negative for fracture or focal lesion. Sinuses/Orbits: The visualized paranasal sinuses are essentially clear. The mastoid air cells are unopacified. Other: Soft tissue swelling/hematoma overlying the right posterior vertex (series 4/ image 26). CT CERVICAL SPINE FINDINGS Alignment: Normal cervical lordosis. Skull base and vertebrae: No acute fracture. No primary bone lesion or focal pathologic process. Soft tissues and spinal  canal: No prevertebral fluid or swelling. No visible canal hematoma. Disc levels: Mild degenerative changes of the mid/lower cervical spine. Spinal canal is patent. Upper chest: Visualized lung apices are clear. Other: Visualized thyroid is unremarkable. IMPRESSION: Soft tissue swelling/hematoma overlying the right posterior vertex. No evidence of calvarial fracture. No evidence of acute intracranial abnormality. Mild atrophy with small vessel ischemic changes. No evidence of traumatic injury to the cervical spine. Mild degenerative changes. Electronically Signed   By: Julian Hy M.D.   On: 05/25/2017 13:44    Assessment/Plan 1. Disorientation Labs done today do not show any signs of infection Will Order MRI of head to rule out Stroke D/W Wife She says she just want him to be comfortable and taken care of   2. Moderate dementia with mood disturbance, unspecified dementia type ([place]) Acute change  Rule out Stroke - MR BRAIN WO CONTRAST; Future  3. Thrombocytopenia ([place]) Platelets today were 83 Discontinue Zyprexa and Meloxicam CBC in  1 week  4. Moderate episode of recurrent major depressive disorder (HCC) On Zoloft will continue that for now  5. Acquired hypothyroidism Tsh Normal in 03/23  6. Idiopathic peripheral neuropathy Continue on Neurontin  7. Edema of both lower legs due to peripheral venous insufficiency On Low dose of Lasix 8 Urinary frequency On Flomax Has seen Urology before 9 Generalized weakness Can't use his power chair now due to cognition Using Diapers also Therapy working with him  Family/ staff Communication:   Labs/tests ordered: MRI of Head

## 2022-03-04 LAB — BASIC METABOLIC PANEL
BUN: 13 (ref 4–21)
CO2: 30 — AB (ref 13–22)
Chloride: 97 — AB (ref 99–108)
Creatinine: 0.8 (ref 0.6–1.3)
Glucose: 69
Potassium: 4.4 mEq/L (ref 3.5–5.1)
Sodium: 136 — AB (ref 137–147)

## 2022-03-04 LAB — CBC AND DIFFERENTIAL
HCT: 39 — AB (ref 41–53)
Hemoglobin: 12.9 — AB (ref 13.5–17.5)
Neutrophils Absolute: 8393
Platelets: 401 10*3/uL — AB (ref 150–400)
WBC: 10.4

## 2022-03-04 LAB — CBC: RBC: 4.54 (ref 3.87–5.11)

## 2022-03-04 LAB — COMPREHENSIVE METABOLIC PANEL
Calcium: 8.3 — AB (ref 8.7–10.7)
eGFR: 86

## 2022-03-10 ENCOUNTER — Non-Acute Institutional Stay (SKILLED_NURSING_FACILITY): Payer: Medicare PPO | Admitting: Adult Health

## 2022-03-10 ENCOUNTER — Other Ambulatory Visit: Payer: Self-pay | Admitting: Adult Health

## 2022-03-10 ENCOUNTER — Encounter: Payer: Self-pay | Admitting: Adult Health

## 2022-03-10 DIAGNOSIS — R630 Anorexia: Secondary | ICD-10-CM | POA: Diagnosis not present

## 2022-03-10 DIAGNOSIS — F03C3 Unspecified dementia, severe, with mood disturbance: Secondary | ICD-10-CM | POA: Diagnosis not present

## 2022-03-10 DIAGNOSIS — F339 Major depressive disorder, recurrent, unspecified: Secondary | ICD-10-CM | POA: Diagnosis not present

## 2022-03-10 MED ORDER — SERTRALINE HCL 25 MG PO TABS: 25.0000 mg | ORAL_TABLET | Freq: Every day | ORAL | 3 refills | Status: AC

## 2022-03-10 MED ORDER — MIRTAZAPINE 15 MG PO TBDP
7.5000 mg | ORAL_TABLET | Freq: Every day | ORAL | 3 refills | Status: DC
Start: 2022-03-10 — End: 2022-05-12

## 2022-03-10 NOTE — Progress Notes (Signed)
Location:  [place] Room Number: [place] of Service:  SNF (31) Provider:  Durenda Age, DNP, FNP-BC  Patient Care Team: Mast, Man X, NP as PCP - General (Internal Medicine) [name], [name], MD as Consulting Physician (Orthopedic Surgery) [name], MD as Consulting Physician (Gastroenterology) System, Provider Not In [name], MD as Consulting Physician (Neurosurgery) [name], MD as Referring Physician (Ophthalmology) Mast, Man X, NP as Nurse Practitioner (Internal Medicine) Almedia Balls, MD as Referring Physician (Orthopedic Surgery) [name], MD as Consulting Physician (Neurosurgery) Elsie Saas, MD as Consulting Physician (Orthopedic Surgery) Allyn Kenner, MD (Dermatology) Kathrynn Ducking, MD (Inactive) as Consulting Physician (Neurology) Franchot Gallo, MD as Consulting Physician (Urology)  Extended Emergency Contact Information Primary Emergency Contact: [name] Address: [address]          [place], [place] [address] [place] Phone: [phone] Relation: Spouse Secondary Emergency Contact: [place] Mobile Phone: [phone] Relation: Relative  Code Status:  DNR  Goals of care: Advanced Directive information    03/10/2022   11:33 AM  Advanced Directives  Does Patient Have a Medical Advance Directive? Yes  Type of Paramedic of [place];Living will;Out of facility DNR (pink MOST or yellow form)  Does patient want to make changes to medical advance directive? No - Patient declined  Copy of [place] in Chart? Yes - validated most recent copy scanned in chart (See row information)  Pre-existing out of facility DNR order (yellow form or pink MOST form) Pink MOST form placed in chart (order not valid for inpatient use)     Chief Complaint  Patient presents with   Acute Visit    Poor  appetite     HPI:  Pt is a 88 y.o. male seen today for an acute visit regarding poor oral intake. He was admitted to SNF from ALF on 02/20/22. He was recently treated with IV fluids X 2L due to AKI due to poor oral intake. Staff reported that he does not eat his meals but only drinks boost and resource. He takes Zoloft 50 mg daily for depression. Latest PHQ-9 score is 0. Latest BIMS score  5/15, ranging in severe cognitive impairment.  Past Medical History:  Diagnosis Date   Abnormal MRI, knee    Per [facility] New Patient Packet    Anemia    Anticoagulated    Anxiety    Arthritis    Cervical disc disorder    Per [facility] New Patient Packet    Complication of anesthesia    states "I was in [place] for several weeks after surgery"caused by tramadol   Constipation    Dysuria 07/28/2016   Edema 07/24/2016   Gait abnormality 07/25/2016   Hard of hearing    Head injury    As a result of a fall, Per [facility] New Patient Packet    Hip arthritis 07/21/2016   History of fall 07/25/2016   Hypercholesteremia    Hypertension    Lumbar spinal stenosis    Memory loss 07/28/2016   mild   Obesity    Per [facility] New Patient Packet    Osteoarthritis of right hip 07/24/2016   Peripheral neuropathy 03/10/2018   Sleep apnea    cpap   Spinal stenosis of lumbar region 05/28/2016   Urinary frequency    Venous thrombosis of leg 07/24/2016   right gastrocnemius  Vertigo    Wears glasses    Weight loss 04/08/2016   Past Surgical History:  Procedure Laterality Date   BACK SURGERY  10/17, 80's   COLONOSCOPY     COLONOSCOPY     Per [facility] Patient Packet, Dr.Schooler    ESOPHAGOGASTRODUODENOSCOPY     LUMBAR LAMINECTOMY/DECOMPRESSION MICRODISCECTOMY Right 05/28/2016   Procedure: Right Lumbar Three-Four Microdiscectomy;  Surgeon: [name], MD;  Location: [place] NEURO ORS;  Service: Neurosurgery;  Laterality: Right;   TOTAL HIP ARTHROPLASTY Right 07/21/2016   Procedure: TOTAL HIP ARTHROPLASTY ANTERIOR APPROACH;   Surgeon: [name], MD;  Location: [place];  Service: Orthopedics;  Laterality: Right;    Allergies  Allergen Reactions   Tramadol Other (See Comments)    "does not eat" LOSS OF APPETITE    Outpatient Encounter Medications as of 03/10/2022  Medication Sig   acetaminophen (TYLENOL) 500 MG tablet Take 1,000 mg by mouth as needed.   aspirin EC 81 MG tablet Take 81 mg by mouth daily. 8am.   atorvastatin (LIPITOR) 20 MG tablet Take 20 mg by mouth daily. 8pm.   Coenzyme Q10 (COQ10) 100 MG CAPS Take 1 capsule by mouth daily. 8am.   Cyanocobalamin (B-12) 1000 MCG TABS Take 1 tablet by mouth every other day. 8am.   fexofenadine (ALLEGRA) 180 MG tablet Take 180 mg by mouth daily as needed for allergies or rhinitis.   fluticasone (FLONASE) 50 MCG/ACT nasal spray Place 2 sprays into both nostrils as needed.    furosemide (LASIX) 20 MG tablet Take 20 mg by mouth daily.   gabapentin (NEURONTIN) 100 MG capsule Take 200 mg by mouth at bedtime. 8pm.   Glucosamine 500 MG CAPS Take by mouth. Take 3 pills to equal '1500mg'$  daily.   Hydroactive Dressings (DYNADERM HYDROCOLLOID 4"X4" EX) Apply topically every 3 (three) days. Cleanse dime-sized area on right buttocks with NSS, pat dry, apply 2X2 dressing, change every 3 days.   hydrocortisone 2.5 % cream Apply topically 2 (two) times daily as needed.   lactose free nutrition (BOOST) LIQD Take 237 mLs by mouth daily.   levothyroxine (SYNTHROID) 75 MCG tablet Take 75 mcg by mouth daily before breakfast.   meclizine (ANTIVERT) 25 MG tablet Take 25 mg by mouth 3 (three) times daily as needed for dizziness.   Menthol, Topical Analgesic, (BIOFREEZE) 4 % GEL Apply topically in the morning, at noon, in the evening, and at bedtime.   mirtazapine (REMERON SOL-TAB) 15 MG disintegrating tablet Take 0.5 tablets (7.5 mg total) by mouth at bedtime.   NAT-RUL PSYLLIUM SEED HUSKS PO Take 1 tablet by mouth daily. For constipation   Nutritional Supplements (RESOURCE 2.0)  LIQD Take 180 mLs by mouth in the morning and at bedtime.   nystatin (MYCOSTATIN/NYSTOP) powder Apply 1 application  topically every other day. Apply to periwound and groin   omeprazole (PRILOSEC) 20 MG capsule Take 20 mg by mouth daily.   potassium chloride SA (KLOR-CON M) 20 MEQ tablet Take 20 mEq by mouth daily.   sennosides-docusate sodium (SENOKOT-S) 8.6-50 MG tablet Take 1 tablet by mouth daily.   sertraline (ZOLOFT) 25 MG tablet Take 1 tablet (25 mg total) by mouth daily.   tamsulosin (FLOMAX) 0.4 MG CAPS capsule Take 0.4 mg by mouth at bedtime.   [DISCONTINUED] hydrOXYzine (VISTARIL) 25 MG capsule Take 25 mg by mouth 2 (two) times daily as needed for anxiety.   No facility-administered encounter medications on file as of 03/10/2022.    Review of Systems  Constitutional:  Positive for appetite change. Negative for activity change and fever.       Poor appetite  HENT:  Negative for sore throat.   Eyes: Negative.   Cardiovascular:  Negative for chest pain and leg swelling.  Gastrointestinal:  Negative for abdominal distention, diarrhea and vomiting.  Genitourinary:  Negative for dysuria, frequency and urgency.  Skin:  Negative for color change.  Neurological:  Negative for dizziness and headaches.  Psychiatric/Behavioral:  Negative for behavioral problems and sleep disturbance. The patient is not nervous/anxious.      Immunization History  Administered Date(s) Administered   Influenza-Unspecified 06/11/2015, 06/11/2019, 06/19/2020, 06/27/2021   Moderna Sars-Covid-2 Vaccination 09/10/2019, 10/08/2019, 07/17/2020, 02/05/2021   PFIZER(Purple Top)SARS-COV-2 Vaccination 09/10/2019   Pneumococcal Conjugate-13 09/12/2014   Pneumococcal Polysaccharide-23 04/21/2011   Pneumococcal-Unspecified 04/21/2019   Td 04/22/2002   Tetanus 05/16/2020   Unspecified SARS-COV-2 Vaccination 05/28/2021   Zoster Recombinat (Shingrix) 09/14/2021, 11/25/2021   Zoster, Live 04/27/2012   Pertinent   Health Maintenance Due  Topic Date Due   INFLUENZA VACCINE  04/08/2022      07/28/2016    3:05 PM 02/08/2018    1:54 PM 03/24/2019    3:10 PM  Fall Risk  Falls in the past year? Yes Yes 0  Was there an injury with Fall? No No 0  Fall Risk Category Calculator   0  Fall Risk Category   Low  Patient Fall Risk Level   Low fall risk  Patient at Risk for Falls Due to History of fall(s);Impaired balance/gait;Impaired mobility    Fall risk Follow up Falls evaluation completed       Vitals:   03/10/22 1106  BP: 112/72  Pulse: 82  Resp: 18  Temp: 98.1 F (36.7 C)  SpO2: 91%  Weight: 178 lb (80.7 kg)  Height: '5\' 9"'$  (1.753 m)   Body mass index is 26.29 kg/m.  Physical Exam Constitutional:      General: He is not in acute distress.    Appearance: Normal appearance.  HENT:     Head: Normocephalic and atraumatic.     Mouth/Throat:     Mouth: Mucous membranes are moist.  Eyes:     Conjunctiva/sclera: Conjunctivae normal.  Cardiovascular:     Rate and Rhythm: Normal rate and regular rhythm.     Pulses: Normal pulses.     Heart sounds: Normal heart sounds.  Pulmonary:     Effort: Pulmonary effort is normal.     Breath sounds: Normal breath sounds.  Abdominal:     General: Bowel sounds are normal.     Palpations: Abdomen is soft.  Musculoskeletal:        General: No swelling. Normal range of motion.     Cervical back: Normal range of motion.  Skin:    General: Skin is warm and dry.  Neurological:     General: No focal deficit present.     Mental Status: He is disoriented.     Comments: Alert to self, disoriented to time and place.  Psychiatric:        Behavior: Behavior normal.     Comments: Sad affect        Labs reviewed: Recent Labs    02/20/22 0000 02/24/22 0000 03/04/22 0000  NA 141 141 136*  K 4.1 3.8 4.4  CL 104 109* 97*  CO2 25* 27* 30*  BUN 24* 17 13  CREATININE 1.2 0.7 0.8  CALCIUM 8.8 7.6* 8.3*   Recent Labs    03/26/21  0000 04/23/21 0000   AST 18 18  ALT 14 14  ALKPHOS 91 87  ALBUMIN 3.9 3.8   Recent Labs    02/20/22 0000 02/24/22 0000 03/04/22 0000  WBC 12.2 8.5 10.4  NEUTROABS 11,553.00 7,378.00 8,393.00  HGB 14.8 12.1* 12.9*  HCT 44 37* 39*  PLT 117* 81* 401*   Lab Results  Component Value Date   TSH 0.98 02/06/2022   No results found for: "HGBA1C" Lab Results  Component Value Date   CHOL 114 05/31/2021   HDL 25 (A) 05/31/2021   LDLCALC 66 05/31/2021   TRIG 149 05/31/2021   CHOLHDL 5.1 (H) 03/31/2019    Significant Diagnostic Results in last 30 days:  No results found.  Assessment/Plan  1. Poor appetite -   will start on Remeron 7.5 mg at bedtime  2. Major depression, recurrent, chronic (HCC) -   PHQ-9 score 0, no depressed mood -   Remeron may cause SSRI toxicity, will decrease Zoloft from 50 mg daily to 25 mg daily -   monitor behavior  3. Severe dementia with mood disturbance, unspecified dementia type ([place]) -  has severe cognitive impairment per BIMS score of 4/15 -   continue supportive care   Family/ staff Communication: Discussed plan of care with resident and charge nurse.  Labs/tests ordered:   None    Durenda Age, DNP, MSN, FNP-BC [facility] [phone] (Monday-Friday 8:00 a.m. - 5:00 p.m.) [phone] (after hours)

## 2022-03-15 LAB — BASIC METABOLIC PANEL
BUN: 14 (ref 4–21)
CO2: 27 — AB (ref 13–22)
Chloride: 101 (ref 99–108)
Creatinine: 0.7 (ref 0.6–1.3)
Glucose: 100
Potassium: 4.2 mEq/L (ref 3.5–5.1)
Sodium: 137 (ref 137–147)

## 2022-03-15 LAB — COMPREHENSIVE METABOLIC PANEL
Calcium: 8.4 — AB (ref 8.7–10.7)
eGFR: 89

## 2022-03-15 LAB — CBC: RBC: 4.6 (ref 3.87–5.11)

## 2022-03-15 LAB — CBC AND DIFFERENTIAL
HCT: 41 (ref 41–53)
Hemoglobin: 12.8 — AB (ref 13.5–17.5)
Neutrophils Absolute: 5038
Platelets: 280 10*3/uL (ref 150–400)
WBC: 6.7

## 2022-03-19 ENCOUNTER — Telehealth: Payer: Medicare PPO | Admitting: Internal Medicine

## 2022-03-19 NOTE — Telephone Encounter (Signed)
Patient has to go to hospital for MRI as he is not able to do his transfers. His wife agreed that we will hold off MRI for now. He continues to do poorly with his appetite and general decline. Will continue to follow.

## 2022-03-27 ENCOUNTER — Encounter: Payer: Self-pay | Admitting: Internal Medicine

## 2022-03-27 ENCOUNTER — Non-Acute Institutional Stay (SKILLED_NURSING_FACILITY): Payer: Medicare PPO | Admitting: Internal Medicine

## 2022-03-27 DIAGNOSIS — F03C3 Unspecified dementia, severe, with mood disturbance: Secondary | ICD-10-CM

## 2022-03-27 DIAGNOSIS — I872 Venous insufficiency (chronic) (peripheral): Secondary | ICD-10-CM | POA: Diagnosis not present

## 2022-03-27 DIAGNOSIS — R634 Abnormal weight loss: Secondary | ICD-10-CM | POA: Diagnosis not present

## 2022-03-27 DIAGNOSIS — R410 Disorientation, unspecified: Secondary | ICD-10-CM | POA: Diagnosis not present

## 2022-03-27 DIAGNOSIS — F339 Major depressive disorder, recurrent, unspecified: Secondary | ICD-10-CM | POA: Diagnosis not present

## 2022-03-27 DIAGNOSIS — R600 Localized edema: Secondary | ICD-10-CM

## 2022-03-27 NOTE — Progress Notes (Signed)
Location:   [place] Room Number: [facility]:  SNF [phone]) Provider:  Veleta Miners MD   Mast, Man X, NP  Patient Care Team: Mast, Man X, NP as PCP - General (Internal Medicine) [name], [name], MD as Consulting Physician (Orthopedic Surgery) [name], MD as Consulting Physician (Gastroenterology) System, Provider Not In [name], MD as Consulting Physician (Neurosurgery) [name], MD as Referring Physician (Ophthalmology) Mast, Man X, NP as Nurse Practitioner (Internal Medicine) Almedia Balls, MD as Referring Physician (Orthopedic Surgery) [name], MD as Consulting Physician (Neurosurgery) Elsie Saas, MD as Consulting Physician (Orthopedic Surgery) Allyn Kenner, MD (Dermatology) Kathrynn Ducking, MD (Inactive) as Consulting Physician (Neurology) Franchot Gallo, MD as Consulting Physician (Urology)  Extended Emergency Contact Information Primary Emergency Contact: [name] Address: [address]          [place], [place] [address] [place] Phone: [phone] Relation: Spouse Secondary Emergency Contact: [place] Mobile Phone: [phone] Relation: Relative  Code Status:  DNR Managed Care Goals of care: Advanced Directive information    03/27/2022   12:16 PM  Advanced Directives  Does Patient Have a Medical Advance Directive? Yes  Type of Paramedic of [place];Living will;Out of facility DNR (pink MOST or yellow form)  Does patient want to make changes to medical advance directive? No - Patient declined  Copy of [place] in Chart? Yes - validated most recent copy scanned in chart (See row information)  Pre-existing out of facility DNR order (yellow form or pink MOST form) Pink MOST form placed in chart (order not valid for inpatient use)     Chief Complaint  Patient presents with   Acute Visit     Weight loss    HPI:  Pt is a 88 y.o. male seen today for an acute visit for Weight loss  Patient has h/o Peripheral Neuropathy Idiopathic uses wheel chair BPH with urinary Frequency,  LE edema,  Arthritis, Sleep Apnea, Hyperlipidemia Cognitive impairment H/o BPPV   Noticed to have acute decline in his Mental status since he has moved [place] SNF from [place] More confused  Unable to do his ADLS Also since then has very poor appetite Lost almost 30 pounds since May Per nurses and his wife he does not eat. He told me he has no appetite. They have even tried outside food and he does not eat Wt Readings from Last 3 Encounters:  03/27/22 166 lb 11.2 oz (75.6 kg)  03/10/22 178 lb (80.7 kg)  01/09/22 198 lbs     He had no complains today Did not know where he is . He thought this wife is having an affair Does not remember being i SNF Can't  do his transfers which is new also. Not standing anymore  Told therapy he thinks wife is Pregnant and told me that she Is sick and needs to be seen  Unfortunately were not able to do MRI in [facility] imaging as he is too weak to transfer himself   Past Medical History:  Diagnosis Date   Abnormal MRI, knee    Per [facility] New Patient Packet    Anemia    Anticoagulated    Anxiety    Arthritis    Cervical disc disorder    Per [facility] New Patient Packet    Complication of anesthesia    states "I was in [place]  for several weeks after surgery"caused by tramadol   Constipation    Dysuria 07/28/2016   Edema 07/24/2016   Gait abnormality 07/25/2016   Hard of hearing    Head injury    As a result of a fall, Per [facility] New Patient Packet    Hip arthritis 07/21/2016   History of fall 07/25/2016   Hypercholesteremia    Hypertension    Lumbar spinal stenosis    Memory loss 07/28/2016   mild   Obesity    Per [facility] New Patient Packet    Osteoarthritis of right hip 07/24/2016   Peripheral neuropathy 03/10/2018   Sleep apnea    cpap   Spinal stenosis of lumbar  region 05/28/2016   Urinary frequency    Venous thrombosis of leg 07/24/2016   right gastrocnemius   Vertigo    Wears glasses    Weight loss 04/08/2016   Past Surgical History:  Procedure Laterality Date   BACK SURGERY  10/17, 80's   COLONOSCOPY     COLONOSCOPY     Per [facility] New Patient Packet, Dr.Schooler    ESOPHAGOGASTRODUODENOSCOPY     LUMBAR LAMINECTOMY/DECOMPRESSION MICRODISCECTOMY Right 05/28/2016   Procedure: Right Lumbar Three-Four Microdiscectomy;  Surgeon: [name], MD;  Location: [place] NEURO ORS;  Service: Neurosurgery;  Laterality: Right;   TOTAL HIP ARTHROPLASTY Right 07/21/2016   Procedure: TOTAL HIP ARTHROPLASTY ANTERIOR APPROACH;  Surgeon: [name], MD;  Location: [place];  Service: Orthopedics;  Laterality: Right;    Allergies  Allergen Reactions   Tramadol Other (See Comments)    "does not eat" LOSS OF APPETITE    Allergies as of 03/27/2022       Reactions   Tramadol Other (See Comments)   "does not eat" LOSS OF APPETITE        Medication List        Accurate as of March 27, 2022 12:16 PM. If you have any questions, ask your nurse or doctor.          acetaminophen 500 MG tablet Commonly known as: TYLENOL Take 1,000 mg by mouth as needed.   aspirin EC 81 MG tablet Take 81 mg by mouth daily. 8am.   atorvastatin 20 MG tablet Commonly known as: LIPITOR Take 20 mg by mouth daily. 8pm.   B-12 1000 MCG Tabs Take 1 tablet by mouth every other day. 8am.   Biofreeze 4 % Gel Generic drug: Menthol (Topical Analgesic) Apply topically in the morning, at noon, in the evening, and at bedtime.   CoQ10 100 MG Caps Take 1 capsule by mouth daily. 8am.   DYNADERM HYDROCOLLOID 4"X4" EX Apply topically every 3 (three) days. Cleanse dime-sized area on right buttocks with NSS, pat dry, apply 2X2 dressing, change every 3 days.   fexofenadine 180 MG tablet Commonly known as: ALLEGRA Take 180 mg by mouth daily as needed for allergies or rhinitis.    fluticasone 50 MCG/ACT nasal spray Commonly known as: FLONASE Place 2 sprays into both nostrils as needed.   furosemide 20 MG tablet Commonly known as: LASIX Take 20 mg by mouth daily.   gabapentin 100 MG capsule Commonly known as: NEURONTIN Take 200 mg by mouth at bedtime. 8pm.   Glucosamine 500 MG Caps Take by mouth. Take 3 pills to equal '1500mg'$  daily.   hydrocortisone 2.5 % cream Apply topically 2 (two) times daily as needed.   lactose free nutrition Liqd Take 237 mLs by mouth daily.   Resource 2.0 Liqd Take 180 mLs  by mouth in the morning and at bedtime.   levothyroxine 75 MCG tablet Commonly known as: SYNTHROID Take 75 mcg by mouth daily before breakfast.   meclizine 25 MG tablet Commonly known as: ANTIVERT Take 25 mg by mouth 3 (three) times daily as needed for dizziness.   mirtazapine 15 MG disintegrating tablet Commonly known as: REMERON SOL-TAB Take 0.5 tablets (7.5 mg total) by mouth at bedtime.   NAT-RUL PSYLLIUM SEED HUSKS PO Take 1 tablet by mouth daily. For constipation   nystatin powder Commonly known as: MYCOSTATIN/NYSTOP Apply 1 application  topically every other day. Apply to periwound and groin   omeprazole 20 MG capsule Commonly known as: PRILOSEC Take 20 mg by mouth daily.   potassium chloride SA 20 MEQ tablet Commonly known as: KLOR-CON M Take 20 mEq by mouth daily.   sennosides-docusate sodium 8.6-50 MG tablet Commonly known as: SENOKOT-S Take 2 tablets by mouth daily.   sertraline 25 MG tablet Commonly known as: ZOLOFT Take 1 tablet (25 mg total) by mouth daily.   tamsulosin 0.4 MG Caps capsule Commonly known as: FLOMAX Take 0.4 mg by mouth at bedtime.        Review of Systems  Constitutional:  Positive for activity change, appetite change and unexpected weight change.  HENT: Negative.    Respiratory:  Negative for cough and shortness of breath.   Cardiovascular:  Negative for leg swelling.  Gastrointestinal:  Negative  for constipation.  Genitourinary:  Negative for frequency.  Musculoskeletal:  Positive for gait problem. Negative for arthralgias and myalgias.  Skin: Negative.  Negative for rash.  Neurological:  Positive for weakness. Negative for dizziness.  Psychiatric/Behavioral:  Positive for confusion and dysphoric mood. Negative for sleep disturbance.   All other systems reviewed and are negative.   Immunization History  Administered Date(s) Administered   Influenza-Unspecified 06/11/2015, 06/11/2019, 06/19/2020, 06/27/2021   Moderna Sars-Covid-2 Vaccination 09/10/2019, 10/08/2019, 07/17/2020, 02/05/2021   PFIZER(Purple Top)SARS-COV-2 Vaccination 09/10/2019   Pneumococcal Conjugate-13 09/12/2014   Pneumococcal Polysaccharide-23 04/21/2011   Pneumococcal-Unspecified 04/21/2019   Td 04/22/2002   Tetanus 05/16/2020   Unspecified SARS-COV-2 Vaccination 05/28/2021   Zoster Recombinat (Shingrix) 09/14/2021, 11/25/2021   Zoster, Live 04/27/2012   Pertinent  Health Maintenance Due  Topic Date Due   INFLUENZA VACCINE  04/08/2022      07/28/2016    3:05 PM 02/08/2018    1:54 PM 03/24/2019    3:10 PM  Fall Risk  Falls in the past year? Yes Yes 0  Was there an injury with Fall? No No 0  Fall Risk Category Calculator   0  Fall Risk Category   Low  Patient Fall Risk Level   Low fall risk  Patient at Risk for Falls Due to History of fall(s);Impaired balance/gait;Impaired mobility    Fall risk Follow up Falls evaluation completed     Functional Status Survey:    Vitals:   03/27/22 1144  BP: (!) 142/83  Pulse: 97  Resp: 18  Temp: 98.4 F (36.9 C)  SpO2: 95%  Weight: 166 lb 11.2 oz (75.6 kg)  Height: '5\' 9"'$  (1.753 m)   Body mass index is 24.62 kg/m. Physical Exam Vitals reviewed.  Constitutional:      Appearance: Normal appearance.  HENT:     Head: Normocephalic.     Nose: Nose normal.     Mouth/Throat:     Mouth: Mucous membranes are moist.     Pharynx: Oropharynx is clear.   Eyes:     Pupils:  Pupils are equal, round, and reactive to light.  Cardiovascular:     Rate and Rhythm: Normal rate and regular rhythm.     Pulses: Normal pulses.     Heart sounds: No murmur heard. Pulmonary:     Effort: Pulmonary effort is normal. No respiratory distress.     Breath sounds: Normal breath sounds. No rales.  Abdominal:     General: Abdomen is flat. Bowel sounds are normal.     Palpations: Abdomen is soft.  Musculoskeletal:        General: No swelling.     Cervical back: Neck supple.  Skin:    General: Skin is warm.  Neurological:     General: No focal deficit present.     Mental Status: He is alert.  Psychiatric:        Mood and Affect: Mood normal.        Thought Content: Thought content normal.     Labs reviewed: Recent Labs    02/24/22 0000 03/04/22 0000 03/15/22 0000  NA 141 136* 137  K 3.8 4.4 4.2  CL 109* 97* 101  CO2 27* 30* 27*  BUN '17 13 14  '$ CREATININE 0.7 0.8 0.7  CALCIUM 7.6* 8.3* 8.4*   Recent Labs    04/23/21 0000  AST 18  ALT 14  ALKPHOS 87  ALBUMIN 3.8   Recent Labs    02/24/22 0000 03/04/22 0000 03/15/22 0000  WBC 8.5 10.4 6.7  NEUTROABS 7,378.00 8,393.00 5,038.00  HGB 12.1* 12.9* 12.8*  HCT 37* 39* 41  PLT 81* 401* 280   Lab Results  Component Value Date   TSH 0.98 02/06/2022   No results found for: "HGBA1C" Lab Results  Component Value Date   CHOL 114 05/31/2021   HDL 25 (A) 05/31/2021   LDLCALC 66 05/31/2021   TRIG 149 05/31/2021   CHOLHDL 5.1 (H) 03/31/2019    Significant Diagnostic Results in last 30 days:  No results found.  Assessment/Plan 1. Weight loss  - CT Abdomen Pelvis W Contrast; Future - CT HEAD W CONTRAST (5MM); Future  2. Disorientation Acute Worsening recently - CT HEAD W CONTRAST (5MM); Future  3. Major depression, recurrent, chronic (HCC) Continue Remeron and Zoloft  4. Severe dementia with mood disturbance, unspecified dementia type ([place]) With recent worsening Needs SNF  level of care now  5. Edema of both lower legs due to peripheral venous insufficiency Discontinue Lasix No edema and he has Poor Appetite   Family/ staff Communication:   Labs/tests ordered:

## 2022-04-11 ENCOUNTER — Ambulatory Visit (HOSPITAL_COMMUNITY)
Admission: RE | Admit: 2022-04-11 | Discharge: 2022-04-11 | Disposition: A | Discharge status: Home / Self Care | Payer: Medicare PPO | Source: Ambulatory Visit | Attending: Internal Medicine | Admitting: Internal Medicine

## 2022-04-11 ENCOUNTER — Other Ambulatory Visit: Payer: Self-pay | Admitting: Internal Medicine

## 2022-04-11 DIAGNOSIS — R634 Abnormal weight loss: Secondary | ICD-10-CM | POA: Diagnosis not present

## 2022-04-11 DIAGNOSIS — R410 Disorientation, unspecified: Secondary | ICD-10-CM | POA: Insufficient documentation

## 2022-04-11 DIAGNOSIS — R4182 Altered mental status, unspecified: Secondary | ICD-10-CM | POA: Diagnosis not present

## 2022-04-11 DIAGNOSIS — K3189 Other diseases of stomach and duodenum: Secondary | ICD-10-CM | POA: Diagnosis not present

## 2022-04-11 DIAGNOSIS — K7689 Other specified diseases of liver: Secondary | ICD-10-CM | POA: Diagnosis not present

## 2022-04-11 MED ORDER — SODIUM CHLORIDE (PF) 0.9 % IJ SOLN
INTRAMUSCULAR | Status: AC
  Filled 2022-04-11: qty 50

## 2022-04-11 MED ORDER — IOHEXOL 300 MG/ML  SOLN
100.0000 mL | Freq: Once | INTRAMUSCULAR | Status: AC | PRN
  Administered 2022-04-11: 100 mL via INTRAVENOUS

## 2022-04-14 ENCOUNTER — Encounter: Payer: Self-pay | Admitting: Nurse Practitioner

## 2022-04-14 ENCOUNTER — Non-Acute Institutional Stay (SKILLED_NURSING_FACILITY): Payer: Medicare PPO | Admitting: Nurse Practitioner

## 2022-04-14 DIAGNOSIS — K5900 Constipation, unspecified: Secondary | ICD-10-CM | POA: Diagnosis not present

## 2022-04-14 DIAGNOSIS — R350 Frequency of micturition: Secondary | ICD-10-CM

## 2022-04-14 DIAGNOSIS — M159 Polyosteoarthritis, unspecified: Secondary | ICD-10-CM

## 2022-04-14 DIAGNOSIS — R600 Localized edema: Secondary | ICD-10-CM

## 2022-04-14 DIAGNOSIS — R413 Other amnesia: Secondary | ICD-10-CM

## 2022-04-14 DIAGNOSIS — G6289 Other specified polyneuropathies: Secondary | ICD-10-CM | POA: Diagnosis not present

## 2022-04-14 DIAGNOSIS — R634 Abnormal weight loss: Secondary | ICD-10-CM | POA: Diagnosis not present

## 2022-04-14 DIAGNOSIS — E039 Hypothyroidism, unspecified: Secondary | ICD-10-CM | POA: Diagnosis not present

## 2022-04-14 DIAGNOSIS — J309 Allergic rhinitis, unspecified: Secondary | ICD-10-CM

## 2022-04-14 DIAGNOSIS — F419 Anxiety disorder, unspecified: Secondary | ICD-10-CM | POA: Diagnosis not present

## 2022-04-14 DIAGNOSIS — I872 Venous insufficiency (chronic) (peripheral): Secondary | ICD-10-CM | POA: Diagnosis not present

## 2022-04-14 DIAGNOSIS — E538 Deficiency of other specified B group vitamins: Secondary | ICD-10-CM

## 2022-04-14 DIAGNOSIS — E559 Vitamin D deficiency, unspecified: Secondary | ICD-10-CM

## 2022-04-14 DIAGNOSIS — K219 Gastro-esophageal reflux disease without esophagitis: Secondary | ICD-10-CM

## 2022-04-14 DIAGNOSIS — E7800 Pure hypercholesterolemia, unspecified: Secondary | ICD-10-CM

## 2022-04-14 NOTE — Assessment & Plan Note (Addendum)
CT abd/pelvis with CM, Underlying neoplasm or duodenal ulcer and reactive change is suspected. HPOA [name]: will speak with her 2 sons to discuss plan of care. She is leaning toward palliative care, but would like to know about the disease process MRI vs GI consultation. Will  increased Omeprazole, adding Sucralfate, dc ASA for abd pain for now. Will increase Sertraline for better mood control.

## 2022-04-14 NOTE — Assessment & Plan Note (Signed)
takes Levothyroxine, TSH 0.98 02/06/22

## 2022-04-14 NOTE — Assessment & Plan Note (Signed)
takes Vit B12 

## 2022-04-14 NOTE — Assessment & Plan Note (Addendum)
Crying, sad facial looks,  Will increase Sertraline to '50mg'$  qd, continue  Mirtazapine

## 2022-04-14 NOTE — Assessment & Plan Note (Signed)
not apparent, off Furosemide.  

## 2022-04-14 NOTE — Assessment & Plan Note (Signed)
Left knee OA, R shoulder,  X-ray R hip/knee, f/u Ortho, prn Tylenol.

## 2022-04-14 NOTE — Progress Notes (Unsigned)
Location:  [facility] Room Number: NO/16/A Place of Service:  SNF (31) Provider:  Dreama Kuna X, NP Karlita Lichtman X, NP  Patient Care Team: Vienne Corcoran X, NP as PCP - General (Internal Medicine) [name], [name], MD as Consulting Physician (Orthopedic Surgery) [name], MD as Consulting Physician (Gastroenterology) System, Provider Not In [name], MD as Consulting Physician (Neurosurgery) [name], MD as Referring Physician (Ophthalmology) Miking Usrey X, NP as Nurse Practitioner (Internal Medicine) Almedia Balls, MD as Referring Physician (Orthopedic Surgery) [name], MD as Consulting Physician (Neurosurgery) Elsie Saas, MD as Consulting Physician (Orthopedic Surgery) Allyn Kenner, MD (Dermatology) Kathrynn Ducking, MD (Inactive) as Consulting Physician (Neurology) Franchot Gallo, MD as Consulting Physician (Urology)  Extended Emergency Contact Information Primary Emergency Contact: [name] Address: [address]          [place], [place] [address] [place] Phone: [phone] Relation: Spouse Secondary Emergency Contact: [place] Mobile Phone: [phone] Relation: Relative  Code Status:  DNR Goals of care: Advanced Directive information    04/14/2022    2:11 PM  Advanced Directives  Does Patient Have a Medical Advance Directive? Yes  Type of Paramedic of [place];Living will;Out of facility DNR (pink MOST or yellow form)  Does patient want to make changes to medical advance directive? No - Patient declined  Copy of [place] in Chart? Yes - validated most recent copy scanned in chart (See row information)  Pre-existing out of facility DNR order (yellow form or pink MOST form) Pink MOST form placed in chart (order not valid for inpatient use)     Chief Complaint  Patient presents with   Medical Management of Chronic  Issues    Patient is here for a follow up for chronic conditions     HPI:  Pt is a 88 y.o. male seen today for an acute visit for    Past Medical History:  Diagnosis Date   Abnormal MRI, knee    Per [facility] New Patient Packet    Anemia    Anticoagulated    Anxiety    Arthritis    Cervical disc disorder    Per [facility] New Patient Packet    Complication of anesthesia    states "[place] was [place] weeks after surgery"caused by tramadol   Constipation    Dysuria 07/28/2016   Edema 07/24/2016   Gait abnormality 07/25/2016   Hard of hearing    Head injury    As a result of a fall, Per [facility] New Patient Packet    Hip arthritis 07/21/2016   History of fall 07/25/2016   Hypercholesteremia    Hypertension    Lumbar spinal stenosis    Memory loss 07/28/2016   mild   Obesity    Per [facility] New Patient Packet    Osteoarthritis of right hip 07/24/2016   Peripheral neuropathy 03/10/2018   Sleep apnea    cpap   Spinal stenosis of lumbar region 05/28/2016   Urinary frequency    Venous thrombosis of leg 07/24/2016   right gastrocnemius   Vertigo    Wears glasses    Weight loss 04/08/2016   Past Surgical History:  Procedure Laterality Date   BACK SURGERY  10/17, 80's   COLONOSCOPY     COLONOSCOPY     Per [facility] Patient Packet, Dr.Schooler    ESOPHAGOGASTRODUODENOSCOPY  LUMBAR LAMINECTOMY/DECOMPRESSION MICRODISCECTOMY Right 05/28/2016   Procedure: Right Lumbar Three-Four Microdiscectomy;  Surgeon: [name], MD;  Location: [facility];  Service: Neurosurgery;  Laterality: Right;   TOTAL HIP ARTHROPLASTY Right 07/21/2016   Procedure: TOTAL HIP ARTHROPLASTY ANTERIOR APPROACH;  Surgeon: [name], MD;  Location: [facility];  Service: Orthopedics;  Laterality: Right;    Allergies  Allergen Reactions   Tramadol Other (See Comments)    "does not eat" LOSS OF APPETITE    Outpatient Encounter Medications as of 04/14/2022  Medication Sig   acetaminophen (TYLENOL) 500 MG  tablet Take 1,000 mg by mouth as needed.   aspirin EC 81 MG tablet Take 81 mg by mouth daily. 8am.   atorvastatin (LIPITOR) 20 MG tablet Take 20 mg by mouth daily. 8pm.   Coenzyme Q10 (COQ10) 100 MG CAPS Take 1 capsule by mouth daily. 8am.   Cyanocobalamin (B-12) 1000 MCG TABS Take 1 tablet by mouth every other day. 8am.   fexofenadine (ALLEGRA) 180 MG tablet Take 180 mg by mouth daily as needed for allergies or rhinitis.   fluticasone (FLONASE) 50 MCG/ACT nasal spray Place 2 sprays into both nostrils as needed.    furosemide (LASIX) 20 MG tablet Take 20 mg by mouth daily.   gabapentin (NEURONTIN) 100 MG capsule Take 200 mg by mouth at bedtime. 8pm.   Glucosamine 500 MG CAPS Take by mouth. Take 3 pills to equal '1500mg'$  daily.   Hydroactive Dressings (DYNADERM HYDROCOLLOID 4"X4" EX) Apply topically every 3 (three) days. Cleanse dime-sized area on right buttocks with NSS, pat dry, apply 2X2 dressing, change every 3 days.   hydrocortisone 2.5 % cream Apply topically 2 (two) times daily as needed.   lactose free nutrition (BOOST) LIQD Take 237 mLs by mouth daily.   levothyroxine (SYNTHROID) 75 MCG tablet Take 75 mcg by mouth daily before breakfast.   meclizine (ANTIVERT) 25 MG tablet Take 25 mg by mouth 3 (three) times daily as needed for dizziness.   Menthol, Topical Analgesic, (BIOFREEZE) 4 % GEL Apply topically in the morning, at noon, in the evening, and at bedtime.   mirtazapine (REMERON SOL-TAB) 15 MG disintegrating tablet Take 0.5 tablets (7.5 mg total) by mouth at bedtime.   NAT-RUL PSYLLIUM SEED HUSKS PO Take 1 tablet by mouth daily. For constipation   Nutritional Supplements (RESOURCE 2.0) LIQD Take 180 mLs by mouth in the morning and at bedtime.   nystatin (MYCOSTATIN/NYSTOP) powder Apply 1 application  topically every other day. Apply to periwound and groin   omeprazole (PRILOSEC) 20 MG capsule Take 20 mg by mouth daily.   potassium chloride SA (KLOR-CON M) 20 MEQ tablet Take 20 mEq by  mouth daily.   sennosides-docusate sodium (SENOKOT-S) 8.6-50 MG tablet Take 2 tablets by mouth daily.   sertraline (ZOLOFT) 25 MG tablet Take 1 tablet (25 mg total) by mouth daily.   tamsulosin (FLOMAX) 0.4 MG CAPS capsule Take 0.4 mg by mouth at bedtime.   No facility-administered encounter medications on file as of 04/14/2022.    Review of Systems  Immunization History  Administered Date(s) Administered   Influenza-Unspecified 06/11/2015, 06/11/2019, 06/19/2020, 06/27/2021   Moderna Sars-Covid-2 Vaccination 09/10/2019, 10/08/2019, 07/17/2020, 02/05/2021   PFIZER(Purple Top)SARS-COV-2 Vaccination 09/10/2019   Pneumococcal Conjugate-13 09/12/2014   Pneumococcal Polysaccharide-23 04/21/2011   Pneumococcal-Unspecified 04/21/2019   Td 04/22/2002   Tetanus 05/16/2020   Unspecified SARS-COV-2 Vaccination 05/28/2021   Zoster Recombinat (Shingrix) 09/14/2021, 11/25/2021   Zoster, Live 04/27/2012   Pertinent  Health Maintenance Due  Topic Date Due   INFLUENZA VACCINE  04/08/2022      07/28/2016    3:05 PM 02/08/2018    1:54 PM 03/24/2019    3:10 PM  Fall Risk  Falls in the past year? Yes Yes 0  Was there an injury with Fall? No No 0  Fall Risk Category Calculator   0  Fall Risk Category   Low  Patient Fall Risk Level   Low fall risk  Patient at Risk for Falls Due to History of fall(s);Impaired balance/gait;Impaired mobility    Fall risk Follow up Falls evaluation completed     Functional Status Survey:    Vitals:   04/14/22 1412  BP: 106/66  Pulse: 88  Resp: 20  Temp: 98.6 F (37 C)  SpO2: 94%  Weight: 164 lb (74.4 kg)  Height: '5\' 9"'$  (1.753 m)   Body mass index is 24.22 kg/m. Physical Exam  Labs reviewed: Recent Labs    02/24/22 0000 03/04/22 0000 03/15/22 0000  NA 141 136* 137  K 3.8 4.4 4.2  CL 109* 97* 101  CO2 27* 30* 27*  BUN '17 13 14  '$ CREATININE 0.7 0.8 0.7  CALCIUM 7.6* 8.3* 8.4*   Recent Labs    04/23/21 0000  AST 18  ALT 14  ALKPHOS 87   ALBUMIN 3.8   Recent Labs    02/24/22 0000 03/04/22 0000 03/15/22 0000  WBC 8.5 10.4 6.7  NEUTROABS 7,378.00 8,393.00 5,038.00  HGB 12.1* 12.9* 12.8*  HCT 37* 39* 41  PLT 81* 401* 280   Lab Results  Component Value Date   TSH 0.98 02/06/2022   No results found for: "HGBA1C" Lab Results  Component Value Date   CHOL 114 05/31/2021   HDL 25 (A) 05/31/2021   LDLCALC 66 05/31/2021   TRIG 149 05/31/2021   CHOLHDL 5.1 (H) 03/31/2019    Significant Diagnostic Results in last 30 days:  CT Abdomen Pelvis W Contrast  Result Date: 04/11/2022 CLINICAL DATA:  Unintended weight loss, disorientation EXAM: CT ABDOMEN AND PELVIS WITH CONTRAST TECHNIQUE: Multidetector CT imaging of the abdomen and pelvis was performed using the standard protocol following bolus administration of intravenous contrast. RADIATION DOSE REDUCTION: This exam was performed according to the departmental dose-optimization program which includes automated exposure control, adjustment of the mA and/or kV according to patient size and/or use of iterative reconstruction technique. CONTRAST:  143m OMNIPAQUE IOHEXOL 300 MG/ML  SOLN COMPARISON:  None Available. FINDINGS: Lower chest: Minimal atelectasis or scarring within the right lower lobe. Trace right pleural effusion. Calcified left lower lobe granuloma. Moderate hiatal hernia. Hepatobiliary: The liver is unremarkable without focal abnormality. Gallbladder is moderately distended, with multiple noncalcified gallstones identified. There is mild gallbladder wall thickening and pericholecystic fat stranding, with loss of normal fat plane between the gallbladder neck, proximal duodenum, and gastric antrum. Differential diagnosis would include acute/chronic cholecystitis versus underlying neoplasm. Correlation with upper endoscopy/EUS and/or MRI may be useful. Pancreas: Unremarkable. No pancreatic ductal dilatation or surrounding inflammatory changes. Spleen: Normal in size without  focal abnormality. Adrenals/Urinary Tract: No urinary tract calculi or obstructive uropathy. No worrisome solid renal lesions. The adrenals and bladder are unremarkable. Stomach/Bowel: As discussed above, there is abnormal wall thickening involving the gastric antrum and proximal duodenum, obscuring the fat planes between the structures and the gallbladder neck. There is a possible mass involving the medial margin of the first portion duodenum, measuring 2.7 x 2.7 x 1.6 cm, reference axial image 35/3 and coronal image 43/6. There  may be a small central ulceration seen on the coronal reconstruction. Again, correlation with upper endoscopy/EUS may be useful. There is no bowel obstruction or ileus. Normal appendix right lower quadrant. Moderate retained stool throughout the colon, most pronounced within the rectal vault, consistent with constipation. Vascular/Lymphatic: Aortic atherosclerosis. No enlarged abdominal or pelvic lymph nodes. Reproductive: Prostate is unremarkable. Other: No free fluid or free intraperitoneal gas. No abdominal wall hernia. Musculoskeletal: Anterior wedge compression deformity of L1 with greater than 75% loss of height and 7 mm of retropulsion appears chronic. No visible fracture line, and there is no paraspinal soft tissue swelling. No other acute bony abnormalities are identified. Unremarkable right hip arthroplasty. Reconstructed images demonstrate no additional findings. IMPRESSION: 1. Abnormal wall thickening along the medial aspect of the proximal duodenum, with loss of normal fat plane between the duodenum, gastric antrum, and gallbladder neck as above. Underlying neoplasm or duodenal ulcer and reactive change is suspected. Correlation with upper endoscopy may be useful. 2. Multiple noncalcified gallstones, with mild gallbladder wall thickening and pericholecystic fat stranding as above. Findings could reflect secondary inflammatory changes related to the duodenal process described  above, no acute or chronic cholecystitis could give a similar appearance. Please correlate with clinical presentation. Additional evaluation with MRI or nuclear medicine hepatobiliary scan could be performed for further characterization. 3. Moderate hiatal hernia. 4. Age indeterminate L1 compression fracture, likely chronic as stated above. 7 mm retropulsion results in central canal narrowing. 5. Significant colonic fecal retention consistent with constipation. 6.  Aortic Atherosclerosis (ICD10-I70.0). These results will be called to the ordering clinician or representative by the Radiologist Assistant, and communication documented in the PACS or [employer]. Electronically Signed   By: Randa Ngo M.D.   On: 04/11/2022 17:59   CT HEAD W & WO CONTRAST (5MM)  Result Date: 04/11/2022 CLINICAL DATA:  Mental status change, unknown cause. Unintentional weight loss. Disorientation. EXAM: CT HEAD WITHOUT AND WITH CONTRAST TECHNIQUE: Contiguous axial images were obtained from the base of the skull through the vertex without and with intravenous contrast. RADIATION DOSE REDUCTION: This exam was performed according to the departmental dose-optimization program which includes automated exposure control, adjustment of the mA and/or kV according to patient size and/or use of iterative reconstruction technique. CONTRAST:  137m OMNIPAQUE IOHEXOL 300 MG/ML  SOLN COMPARISON:  Noncontrast head CT 05/25/2017 FINDINGS: Brain: There is no evidence of an acute infarct, intracranial hemorrhage, mass, midline shift, or extra-axial fluid collection. Moderate cerebral atrophy has progressed from the prior study including somewhat prominent volume loss in the mesial temporal lobes. Hypodensities in the cerebral white matter bilaterally have progressed and are nonspecific but compatible with mild-to-moderate chronic small vessel ischemic disease. No abnormal enhancement is identified. Vascular: Calcified atherosclerosis at the  skull base. Gross patency of the dural venous sinuses and large intracranial arteries. Skull: No fracture or suspicious osseous lesion. Sinuses/Orbits: Visualized paranasal sinuses and mastoid air cells are clear. Unremarkable orbits. Other: None. IMPRESSION: 1. No evidence of acute intracranial abnormality or mass. 2. Progressive cerebral atrophy and chronic small vessel ischemic disease. Electronically Signed   By: ALogan BoresM.D.   On: 04/11/2022 14:51    Assessment/Plan There are no diagnoses linked to this encounter.   Family/ staff Communication: ***  Labs/tests ordered:  ***

## 2022-04-14 NOTE — Assessment & Plan Note (Addendum)
Staff reported the patient's abd pain-more like hungry pain, comes and goes, will increase Omeprazole to '40mg'$  qd, adding Sucralfate 54m qid(ac meals and hs)x 2 weeks, dc ASA for now.   Hgb 12.8 03/15/22

## 2022-04-14 NOTE — Progress Notes (Unsigned)
Location:   [facility]   Place of Service:   [facility] Provider: [facility] Madalina Rosman NP  Estefana Taylor X, NP  Patient Care Team: Corinthian Kemler X, NP as PCP - General (Internal Medicine) [name], [name], MD as Consulting Physician (Orthopedic Surgery) [name], MD as Consulting Physician (Gastroenterology) System, Provider Not In [name], MD as Consulting Physician (Neurosurgery) [name], MD as Referring Physician (Ophthalmology) Opaline Reyburn X, NP as Nurse Practitioner (Internal Medicine) Almedia Balls, MD as Referring Physician (Orthopedic Surgery) [name], MD as Consulting Physician (Neurosurgery) Elsie Saas, MD as Consulting Physician (Orthopedic Surgery) Allyn Kenner, MD (Dermatology) Kathrynn Ducking, MD (Inactive) as Consulting Physician (Neurology) Franchot Gallo, MD as Consulting Physician (Urology)  Extended Emergency Contact Information Primary Emergency Contact: [name] Address: [address]          [address]          [place], [place] [address] [place] Phone: [phone] Relation: Spouse Secondary Emergency Contact: [place] Mobile Phone: [phone] Relation: Relative  Code Status: DNR Goals of care: Advanced Directive information    04/14/2022    2:11 PM  Advanced Directives  Does Patient Have a Medical Advance Directive? Yes  Type of Paramedic of [place];Living will;Out of facility DNR (pink MOST or yellow form)  Does patient want to make changes to medical advance directive? No - Patient declined  Copy of [place] in Chart? Yes - validated most recent copy scanned in chart (See row information)  Pre-existing out of facility DNR order (yellow form or pink MOST form) Pink MOST form placed in chart (order not valid for inpatient use)     Chief Complaint  Patient presents with  . Abdominal Pain  . Depression  . Acute Visit    HPI:  Pt is a 88 y.o.  male seen today for an acute visit for abd CT scan  Weight loss: CT abd/pelvis with CM, Underlying neoplasm or duodenal ulcer and reactive change is suspected.  Dementia/Disorientation: CT head with CM, No evidence of acute intracranial abnormality or mass. Progressive cerebral atrophy and chronic small vessel ischemic disease. Peripheral neuropathy, weakness in legs, w/c for mobility, on Gabapentin. Takes Vit B12. Had nerve conduction study 03/10/18             Anxiety/depression, on Sertraline, Mirtazapine             Left knee OA, R shoulder,  X-ray R hip/knee, f/u Ortho, prn Tylenol.              Urinary frequency, stable, on Tamsulosin. underwent Urology evaluation in the past.              Hypothyroidism, takes Levothyroxine, TSH 0.98 02/06/22             Edema BLE, not apparent, off Furosemide.              Constipation, takes Senokot S             GERD, takes Omeprazole,  Hgb 12.8 03/15/22             Vit D deficiency, Vit D level 23, taking Vit D             Allergic rhinitis, takes Fluticasone nasal spray, Fexofenadine.              Vit B12 deficiency, takes Vit B12  Hyperlipidemia, LDL 66 05/31/21, takes Atorvastatin.   Past Medical History:  Diagnosis Date  . Abnormal MRI, knee    Per [facility] New Patient Packet   . Anemia   . Anticoagulated   . Anxiety   . Arthritis   . Cervical disc disorder    Per [facility] Patient Packet   . Complication of anesthesia    states "I was [place] several weeks after surgery"caused by tramadol  . Constipation   . Dysuria 07/28/2016  . Edema 07/24/2016  . Gait abnormality 07/25/2016  . Hard of hearing   . Head injury    As a result of a fall, Per [facility] New Patient Packet   . Hip arthritis 07/21/2016  . History of fall 07/25/2016  . Hypercholesteremia   . Hypertension   . Lumbar spinal stenosis   . Memory loss 07/28/2016   mild  . Obesity    Per [facility] New Patient Packet   . Osteoarthritis of right hip 07/24/2016  .  Peripheral neuropathy 03/10/2018  . Sleep apnea    cpap  . Spinal stenosis of lumbar region 05/28/2016  . Urinary frequency   . Venous thrombosis of leg 07/24/2016   right gastrocnemius  . Vertigo   . Wears glasses   . Weight loss 04/08/2016   Past Surgical History:  Procedure Laterality Date  . BACK SURGERY  10/17, 80's  . COLONOSCOPY    . COLONOSCOPY     Per [facility] New Patient Packet, [place]   . ESOPHAGOGASTRODUODENOSCOPY    . LUMBAR LAMINECTOMY/DECOMPRESSION MICRODISCECTOMY Right 05/28/2016   Procedure: Right Lumbar Three-Four Microdiscectomy;  Surgeon: [name], MD;  Location: [place] NEURO ORS;  Service: Neurosurgery;  Laterality: Right;  . TOTAL HIP ARTHROPLASTY Right 07/21/2016   Procedure: TOTAL HIP ARTHROPLASTY ANTERIOR APPROACH;  Surgeon: [name], MD;  Location: [place];  Service: Orthopedics;  Laterality: Right;    Allergies  Allergen Reactions  . Tramadol Other (See Comments)    "does not eat" LOSS OF APPETITE    Allergies as of 04/14/2022       Reactions   Tramadol Other (See Comments)   "does not eat" LOSS OF APPETITE        Medication List        Accurate as of April 14, 2022 11:59 PM. If you have any questions, ask your nurse or doctor.          acetaminophen 500 MG tablet Commonly known as: TYLENOL Take 1,000 mg by mouth as needed.   aspirin EC 81 MG tablet Take 81 mg by mouth daily. 8am.   atorvastatin 20 MG tablet Commonly known as: LIPITOR Take 20 mg by mouth daily. 8pm.   B-12 1000 MCG Tabs Take 1 tablet by mouth every other day. 8am.   Biofreeze 4 % Gel Generic drug: Menthol (Topical Analgesic) Apply topically in the morning, at noon, in the evening, and at bedtime.   CoQ10 100 MG Caps Take 1 capsule by mouth daily. 8am.   DYNADERM HYDROCOLLOID 4"X4" EX Apply topically every 3 (three) days. Cleanse dime-sized area on right buttocks with NSS, pat dry, apply 2X2 dressing, change every 3 days.   fexofenadine 180 MG  tablet Commonly known as: ALLEGRA Take 180 mg by mouth daily as needed for allergies or rhinitis.   fluticasone 50 MCG/ACT nasal spray Commonly known as: FLONASE Place 2 sprays into both nostrils as needed.   furosemide 20 MG tablet Commonly known as: LASIX Take 20  mg by mouth daily.   gabapentin 100 MG capsule Commonly known as: NEURONTIN Take 200 mg by mouth at bedtime. 8pm.   Glucosamine 500 MG Caps Take by mouth. Take 3 pills to equal '1500mg'$  daily.   hydrocortisone 2.5 % cream Apply topically 2 (two) times daily as needed.   lactose free nutrition Liqd Take 237 mLs by mouth daily.   Resource 2.0 Liqd Take 180 mLs by mouth in the morning and at bedtime.   levothyroxine 75 MCG tablet Commonly known as: SYNTHROID Take 75 mcg by mouth daily before breakfast.   meclizine 25 MG tablet Commonly known as: ANTIVERT Take 25 mg by mouth 3 (three) times daily as needed for dizziness.   mirtazapine 15 MG disintegrating tablet Commonly known as: REMERON SOL-TAB Take 0.5 tablets (7.5 mg total) by mouth at bedtime.   NAT-RUL PSYLLIUM SEED HUSKS PO Take 1 tablet by mouth daily. For constipation   nystatin powder Commonly known as: MYCOSTATIN/NYSTOP Apply 1 application  topically every other day. Apply to periwound and groin   omeprazole 20 MG capsule Commonly known as: PRILOSEC Take 20 mg by mouth daily.   potassium chloride SA 20 MEQ tablet Commonly known as: KLOR-CON M Take 20 mEq by mouth daily.   sennosides-docusate sodium 8.6-50 MG tablet Commonly known as: SENOKOT-S Take 2 tablets by mouth daily.   sertraline 25 MG tablet Commonly known as: ZOLOFT Take 1 tablet (25 mg total) by mouth daily.   tamsulosin 0.4 MG Caps capsule Commonly known as: FLOMAX Take 0.4 mg by mouth at bedtime.        Review of Systems  Constitutional:  Positive for unexpected weight change. Negative for fatigue and fever.       Weight loss  HENT:  Positive for hearing loss.  Negative for congestion and voice change.   Eyes:  Negative for visual disturbance.  Respiratory:  Negative for cough and shortness of breath.   Cardiovascular:  Negative for leg swelling.  Gastrointestinal:  Positive for abdominal pain. Negative for abdominal distention, constipation, nausea and vomiting.       Comes and goes, not associated with urination or BM, no nausea or voiting  Genitourinary:  Positive for frequency. Negative for difficulty urinating and urgency.  Musculoskeletal:  Positive for arthralgias, back pain and gait problem.       R+L knees, R shoulder, s/p cervical spine+lumber spien surgeries. Mostly pain is in the R shoulder. ]  Skin:  Negative for color change.  Neurological:  Positive for weakness and numbness. Negative for speech difficulty.       Memory lapses. Tingling, numbness, weakness in legs. Involuntary lip movements.   Psychiatric/Behavioral:  Positive for behavioral problems and dysphoric mood. Negative for sleep disturbance. The patient is nervous/anxious.        Crying, sad facial looks.     Immunization History  Administered Date(s) Administered  . Influenza-Unspecified 06/11/2015, 06/11/2019, 06/19/2020, 06/27/2021  . Moderna Sars-Covid-2 Vaccination 09/10/2019, 10/08/2019, 07/17/2020, 02/05/2021  . PFIZER(Purple Top)SARS-COV-2 Vaccination 09/10/2019  . Pneumococcal Conjugate-13 09/12/2014  . Pneumococcal Polysaccharide-23 04/21/2011  . Pneumococcal-Unspecified 04/21/2019  . Td 04/22/2002  . Tetanus 05/16/2020  . Unspecified SARS-COV-2 Vaccination 05/28/2021  . Zoster Recombinat (Shingrix) 09/14/2021, 11/25/2021  . Zoster, Live 04/27/2012   Pertinent  Health Maintenance Due  Topic Date Due  . INFLUENZA VACCINE  04/08/2022      07/28/2016    3:05 PM 02/08/2018    1:54 PM 03/24/2019    3:10 PM  Fall Risk  Falls in the past year? Yes Yes 0  Was there an injury with Fall? No No 0  Fall Risk Category Calculator   0  Fall Risk Category   Low   Patient Fall Risk Level   Low fall risk  Patient at Risk for Falls Due to History of fall(s);Impaired balance/gait;Impaired mobility    Fall risk Follow up Falls evaluation completed     Functional Status Survey:    Vitals:   04/14/22 1038  BP: 106/66  Pulse: 88  Resp: 20  Temp: 98.6 F (37 C)  SpO2: 94%  Weight: 164 lb (74.4 kg)  Height: '5\' 9"'$  (1.753 m)   Body mass index is 24.22 kg/m. Physical Exam Vitals and nursing note reviewed.  Constitutional:      Comments: frail  HENT:     Head: Normocephalic and atraumatic.     Mouth/Throat:     Mouth: Mucous membranes are moist.  Eyes:     Extraocular Movements: Extraocular movements intact.     Conjunctiva/sclera: Conjunctivae normal.     Pupils: Pupils are equal, round, and reactive to light.  Cardiovascular:     Rate and Rhythm: Normal rate and regular rhythm.     Heart sounds: No murmur heard. Pulmonary:     Effort: Pulmonary effort is normal.     Breath sounds: No rales.  Abdominal:     General: Bowel sounds are normal.     Palpations: Abdomen is soft.     Tenderness: There is no abdominal tenderness.  Musculoskeletal:        General: Tenderness present.     Cervical back: Normal range of motion and neck supple.     Right lower leg: No edema.     Left lower leg: No edema.     Comments: minimal edema BLE. R shoulder pain with ROM is chronic. Chronic R+L knee pain, knee support L knee. S/p R hips replacement. S/p cervical/lumbar spine surgeries.   Skin:    General: Skin is warm and dry.  Neurological:     General: No focal deficit present.     Mental Status: He is alert. Mental status is at baseline.     Motor: Weakness present.     Gait: Gait abnormal.     Comments: Moves slow, uses w/c for mobility,  lower body weakness, peripheral neuropathy in legs.   Psychiatric:     Comments: Crying upon my examination.     Labs reviewed: Recent Labs    02/24/22 0000 03/04/22 0000 03/15/22 0000  NA 141 136* 137   K 3.8 4.4 4.2  CL 109* 97* 101  CO2 27* 30* 27*  BUN '17 13 14  '$ CREATININE 0.7 0.8 0.7  CALCIUM 7.6* 8.3* 8.4*   Recent Labs    04/23/21 0000  AST 18  ALT 14  ALKPHOS 87  ALBUMIN 3.8   Recent Labs    02/24/22 0000 03/04/22 0000 03/15/22 0000  WBC 8.5 10.4 6.7  NEUTROABS 7,378.00 8,393.00 5,038.00  HGB 12.1* 12.9* 12.8*  HCT 37* 39* 41  PLT 81* 401* 280   Lab Results  Component Value Date   TSH 0.98 02/06/2022   No results found for: "HGBA1C" Lab Results  Component Value Date   CHOL 114 05/31/2021   HDL 25 (A) 05/31/2021   LDLCALC 66 05/31/2021   TRIG 149 05/31/2021   CHOLHDL 5.1 (H) 03/31/2019    Significant Diagnostic Results in last 30 days:  CT Abdomen Pelvis W Contrast  Result  Date: 04/11/2022 CLINICAL DATA:  Unintended weight loss, disorientation EXAM: CT ABDOMEN AND PELVIS WITH CONTRAST TECHNIQUE: Multidetector CT imaging of the abdomen and pelvis was performed using the standard protocol following bolus administration of intravenous contrast. RADIATION DOSE REDUCTION: This exam was performed according to the departmental dose-optimization program which includes automated exposure control, adjustment of the mA and/or kV according to patient size and/or use of iterative reconstruction technique. CONTRAST:  148m OMNIPAQUE IOHEXOL 300 MG/ML  SOLN COMPARISON:  None Available. FINDINGS: Lower chest: Minimal atelectasis or scarring within the right lower lobe. Trace right pleural effusion. Calcified left lower lobe granuloma. Moderate hiatal hernia. Hepatobiliary: The liver is unremarkable without focal abnormality. Gallbladder is moderately distended, with multiple noncalcified gallstones identified. There is mild gallbladder wall thickening and pericholecystic fat stranding, with loss of normal fat plane between the gallbladder neck, proximal duodenum, and gastric antrum. Differential diagnosis would include acute/chronic cholecystitis versus underlying neoplasm.  Correlation with upper endoscopy/EUS and/or MRI may be useful. Pancreas: Unremarkable. No pancreatic ductal dilatation or surrounding inflammatory changes. Spleen: Normal in size without focal abnormality. Adrenals/Urinary Tract: No urinary tract calculi or obstructive uropathy. No worrisome solid renal lesions. The adrenals and bladder are unremarkable. Stomach/Bowel: As discussed above, there is abnormal wall thickening involving the gastric antrum and proximal duodenum, obscuring the fat planes between the structures and the gallbladder neck. There is a possible mass involving the medial margin of the first portion duodenum, measuring 2.7 x 2.7 x 1.6 cm, reference axial image 35/3 and coronal image 43/6. There may be a small central ulceration seen on the coronal reconstruction. Again, correlation with upper endoscopy/EUS may be useful. There is no bowel obstruction or ileus. Normal appendix right lower quadrant. Moderate retained stool throughout the colon, most pronounced within the rectal vault, consistent with constipation. Vascular/Lymphatic: Aortic atherosclerosis. No enlarged abdominal or pelvic lymph nodes. Reproductive: Prostate is unremarkable. Other: No free fluid or free intraperitoneal gas. No abdominal wall hernia. Musculoskeletal: Anterior wedge compression deformity of L1 with greater than 75% loss of height and 7 mm of retropulsion appears chronic. No visible fracture line, and there is no paraspinal soft tissue swelling. No other acute bony abnormalities are identified. Unremarkable right hip arthroplasty. Reconstructed images demonstrate no additional findings. IMPRESSION: 1. Abnormal wall thickening along the medial aspect of the proximal duodenum, with loss of normal fat plane between the duodenum, gastric antrum, and gallbladder neck as above. Underlying neoplasm or duodenal ulcer and reactive change is suspected. Correlation with upper endoscopy may be useful. 2. Multiple noncalcified  gallstones, with mild gallbladder wall thickening and pericholecystic fat stranding as above. Findings could reflect secondary inflammatory changes related to the duodenal process described above, no acute or chronic cholecystitis could give a similar appearance. Please correlate with clinical presentation. Additional evaluation with MRI or nuclear medicine hepatobiliary scan could be performed for further characterization. 3. Moderate hiatal hernia. 4. Age indeterminate L1 compression fracture, likely chronic as stated above. 7 mm retropulsion results in central canal narrowing. 5. Significant colonic fecal retention consistent with constipation. 6.  Aortic Atherosclerosis (ICD10-I70.0). These results will be called to the ordering clinician or representative by the Radiologist Assistant, and communication documented in the PACS or [employer] Electronically Signed   By: MRanda NgoM.D.   On: 04/11/2022 17:59   CT HEAD W & WO CONTRAST (5MM)  Result Date: 04/11/2022 CLINICAL DATA:  Mental status change, unknown cause. Unintentional weight loss. Disorientation. EXAM: CT HEAD WITHOUT AND WITH CONTRAST TECHNIQUE: Contiguous axial images  were obtained from the base of the skull through the vertex without and with intravenous contrast. RADIATION DOSE REDUCTION: This exam was performed according to the departmental dose-optimization program which includes automated exposure control, adjustment of the mA and/or kV according to patient size and/or use of iterative reconstruction technique. CONTRAST:  121m OMNIPAQUE IOHEXOL 300 MG/ML  SOLN COMPARISON:  Noncontrast head CT 05/25/2017 FINDINGS: Brain: There is no evidence of an acute infarct, intracranial hemorrhage, mass, midline shift, or extra-axial fluid collection. Moderate cerebral atrophy has progressed from the prior study including somewhat prominent volume loss in the mesial temporal lobes. Hypodensities in the cerebral white matter bilaterally have  progressed and are nonspecific but compatible with mild-to-moderate chronic small vessel ischemic disease. No abnormal enhancement is identified. Vascular: Calcified atherosclerosis at the skull base. Gross patency of the dural venous sinuses and large intracranial arteries. Skull: No fracture or suspicious osseous lesion. Sinuses/Orbits: Visualized paranasal sinuses and mastoid air cells are clear. Unremarkable orbits. Other: None. IMPRESSION: 1. No evidence of acute intracranial abnormality or mass. 2. Progressive cerebral atrophy and chronic small vessel ischemic disease. Electronically Signed   By: ALogan BoresM.D.   On: 04/11/2022 14:51    Assessment/Plan: Weight loss CT abd/pelvis with CM, Underlying neoplasm or duodenal ulcer and reactive change is suspected. HPOA LAryaman [place] will speak with her 2 sons to discuss plan of care. She is leaning toward palliative care, but would like to know about the disease process MRI vs GI consultation. Will  increased Omeprazole, adding Sucralfate, dc ASA for abd pain for now. Will increase Sertraline for better mood control.   Memory loss CT head with CM, No evidence of acute intracranial abnormality or mass. Progressive cerebral atrophy and chronic small vessel ischemic Disease.   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 Takes Vit B12. Had nerve conduction study 03/10/18  Anxiety Crying, sad facial looks,  Will increase Sertraline to '50mg'$  qd, continue  Mirtazapine  Osteoarthritis, multiple sites Left knee OA, R shoulder,  X-ray R hip/knee, f/u Ortho, prn Tylenol.   Urinary frequency stable, on Tamsulosin. underwent Urology evaluation in the past.   Hypothyroidism takes Levothyroxine, TSH 0.98 02/06/22  Edema of both lower legs due to peripheral venous insufficiency not apparent, off Furosemide.   Constipation takes Senokot S  GERD (gastroesophageal reflux disease) Staff reported the patient's abd  pain-more like hungry pain, comes and goes, will increase Omeprazole to '40mg'$  qd, adding Sucralfate 112mqid(ac meals and hs)x 2 weeks, dc ASA for now.   Hgb 12.8 03/15/22  Vitamin D deficiency  Vit D level 23, taking Vit D  Allergic rhinitis takes Fluticasone nasal spray, Fexofenadine.   Vitamin B12 deficiency takes Vit B12  Hypercholesteremia LDL 66 05/31/21, takes Atorvastatin.     Family/ staff Communication: plan of care reviewed with the patient and charge nurse.   Labs/tests ordered:  none  Time spend 35 minutes.

## 2022-04-14 NOTE — Assessment & Plan Note (Signed)
takes Fluticasone nasal spray, Fexofenadine.  

## 2022-04-14 NOTE — Assessment & Plan Note (Signed)
Vit D level 23, taking Vit D

## 2022-04-14 NOTE — Assessment & Plan Note (Signed)
takes Senokot S    

## 2022-04-14 NOTE — Assessment & Plan Note (Signed)
CT head with CM, No evidence of acute intracranial abnormality or mass. Progressive cerebral atrophy and chronic small vessel ischemic Disease.

## 2022-04-14 NOTE — Assessment & Plan Note (Signed)
stable, on Tamsulosin. underwent Urology evaluation in the past. 

## 2022-04-14 NOTE — Assessment & Plan Note (Signed)
weakness in legs, w/c for mobility, on Gabapentin. Takes Vit B12. Had nerve conduction study 03/10/18

## 2022-04-14 NOTE — Assessment & Plan Note (Signed)
LDL 66 05/31/21, takes Atorvastatin.

## 2022-04-15 ENCOUNTER — Encounter: Payer: Self-pay | Admitting: Nurse Practitioner

## 2022-04-16 ENCOUNTER — Non-Acute Institutional Stay (SKILLED_NURSING_FACILITY): Payer: Medicare PPO | Admitting: Nurse Practitioner

## 2022-04-16 ENCOUNTER — Encounter: Payer: Self-pay | Admitting: Nurse Practitioner

## 2022-04-16 DIAGNOSIS — E559 Vitamin D deficiency, unspecified: Secondary | ICD-10-CM

## 2022-04-16 DIAGNOSIS — G6289 Other specified polyneuropathies: Secondary | ICD-10-CM | POA: Diagnosis not present

## 2022-04-16 DIAGNOSIS — E039 Hypothyroidism, unspecified: Secondary | ICD-10-CM | POA: Diagnosis not present

## 2022-04-16 DIAGNOSIS — E538 Deficiency of other specified B group vitamins: Secondary | ICD-10-CM

## 2022-04-16 DIAGNOSIS — K5900 Constipation, unspecified: Secondary | ICD-10-CM

## 2022-04-16 DIAGNOSIS — I872 Venous insufficiency (chronic) (peripheral): Secondary | ICD-10-CM

## 2022-04-16 DIAGNOSIS — R413 Other amnesia: Secondary | ICD-10-CM | POA: Diagnosis not present

## 2022-04-16 DIAGNOSIS — E7800 Pure hypercholesterolemia, unspecified: Secondary | ICD-10-CM

## 2022-04-16 DIAGNOSIS — K219 Gastro-esophageal reflux disease without esophagitis: Secondary | ICD-10-CM

## 2022-04-16 DIAGNOSIS — R350 Frequency of micturition: Secondary | ICD-10-CM | POA: Diagnosis not present

## 2022-04-16 DIAGNOSIS — J309 Allergic rhinitis, unspecified: Secondary | ICD-10-CM

## 2022-04-16 DIAGNOSIS — R935 Abnormal findings on diagnostic imaging of other abdominal regions, including retroperitoneum: Secondary | ICD-10-CM | POA: Insufficient documentation

## 2022-04-16 DIAGNOSIS — M159 Polyosteoarthritis, unspecified: Secondary | ICD-10-CM | POA: Diagnosis not present

## 2022-04-16 DIAGNOSIS — R600 Localized edema: Secondary | ICD-10-CM

## 2022-04-16 DIAGNOSIS — F419 Anxiety disorder, unspecified: Secondary | ICD-10-CM

## 2022-04-16 NOTE — Assessment & Plan Note (Signed)
takes Levothyroxine, TSH 0.98 02/06/22

## 2022-04-16 NOTE — Assessment & Plan Note (Signed)
takes Fluticasone nasal spray, Fexofenadine.  

## 2022-04-16 NOTE — Assessment & Plan Note (Signed)
Stable,  takes Senokot S 

## 2022-04-16 NOTE — Assessment & Plan Note (Signed)
LDL 66 05/31/21, takes Atorvastatin.

## 2022-04-16 NOTE — Progress Notes (Unsigned)
Location:  [facility] Room Number: NO/16/A Place of Service:  SNF (31) Provider:  Rhesa Forsberg X, NP  Patient Care Team: Areli Frary X, NP as PCP - General (Internal Medicine) [name], [name], MD as Consulting Physician (Orthopedic Surgery) [name], MD as Consulting Physician (Gastroenterology) System, Provider Not In [name], MD as Consulting Physician (Neurosurgery) [name], MD as Referring Physician (Ophthalmology) Naysha Sholl X, NP as Nurse Practitioner (Internal Medicine) Almedia Balls, MD as Referring Physician (Orthopedic Surgery) [name], MD as Consulting Physician (Neurosurgery) Elsie Saas, MD as Consulting Physician (Orthopedic Surgery) Allyn Kenner, MD (Dermatology) Kathrynn Ducking, MD (Inactive) as Consulting Physician (Neurology) Franchot Gallo, MD as Consulting Physician (Urology)  Extended Emergency Contact Information Primary Emergency Contact: [name] Address: [address]          [place], [place] [address] [place] Phone: [phone] Relation: Spouse Secondary Emergency Contact: [place] Mobile Phone: [phone] Relation: Relative  Code Status:  DNR Goals of care: Advanced Directive information    04/16/2022    9:19 AM  Advanced Directives  Does Patient Have a Medical Advance Directive? Yes  Type of Paramedic of [place];Living will;Out of facility DNR (pink MOST or yellow form)  Does patient want to make changes to medical advance directive? No - Patient declined  Copy of [place] in Chart? Yes - validated most recent copy scanned in chart (See row information)  Pre-existing out of facility DNR order (yellow form or pink MOST form) Pink MOST form placed in chart (order not valid for inpatient use)     Chief Complaint  Patient presents with   Medical Management of Chronic Issues    Patient  is here for a follow up for chronic conditions     HPI:  Pt is a 88 y.o. male seen today for managing chronic medical conditions.    Weight loss: CT abd/pelvis with CM, Underlying neoplasm or duodenal ulcer and reactive change is suspected. Increased Omeprazole, started Sucralfate, dc'd ASA for abd pain   HPOA Abrahim Sargent: will speak with her 2 sons to discuss plan of care. She is leaning toward palliative care, but would like to know about the disease process MRI vs GI consultation.             Dementia/Disorientation: CT head with CM, No evidence of acute intracranial abnormality or mass. Progressive cerebral atrophy and chronic small vessel ischemic disease. Peripheral neuropathy, weakness in legs, w/c for mobility, on Gabapentin. Takes Vit B12. Had nerve conduction study 03/10/18             Anxiety/depression, on Sertraline, Mirtazapine             Left knee OA, R shoulder,  X-ray R hip/knee, f/u Ortho, prn Tylenol.              Urinary frequency, stable, on Tamsulosin. underwent Urology evaluation in the past.              Hypothyroidism, takes Levothyroxine, TSH 0.98 02/06/22             Edema BLE, not apparent, off Furosemide.              Constipation, takes Senokot S             GERD, takes Omeprazole,  Hgb 12.8 03/15/22  Vit D deficiency, Vit D level 23, taking Vit D             Allergic rhinitis, takes Fluticasone nasal spray, Fexofenadine.              Vit B12 deficiency, takes Vit B12             Hyperlipidemia, LDL 66 05/31/21, takes Atorvastatin.    Past Medical History:  Diagnosis Date   Abnormal MRI, knee    Per [facility] New Patient Packet    Anemia    Anticoagulated    Anxiety    Arthritis    Cervical disc disorder    Per [facility] New Patient Packet    Complication of anesthesia    states "[place] was [place] after surgery"caused by tramadol   Constipation    Dysuria 07/28/2016   Edema 07/24/2016   Gait abnormality 07/25/2016   Hard of  hearing    Head injury    As a result of a fall, Per [facility] New Patient Packet    Hip arthritis 07/21/2016   History of fall 07/25/2016   Hypercholesteremia    Hypertension    Lumbar spinal stenosis    Memory loss 07/28/2016   mild   Obesity    Per [facility] New Patient Packet    Osteoarthritis of right hip 07/24/2016   Peripheral neuropathy 03/10/2018   Sleep apnea    cpap   Spinal stenosis of lumbar region 05/28/2016   Urinary frequency    Venous thrombosis of leg 07/24/2016   right gastrocnemius   Vertigo    Wears glasses    Weight loss 04/08/2016   Past Surgical History:  Procedure Laterality Date   BACK SURGERY  10/17, 80's   COLONOSCOPY     COLONOSCOPY     Per [facility] New Patient Packet, Dr.Schooler    ESOPHAGOGASTRODUODENOSCOPY     LUMBAR LAMINECTOMY/DECOMPRESSION MICRODISCECTOMY Right 05/28/2016   Procedure: Right Lumbar Three-Four Microdiscectomy;  Surgeon: [name], MD;  Location: [facility] NEURO ORS;  Service: Neurosurgery;  Laterality: Right;   TOTAL HIP ARTHROPLASTY Right 07/21/2016   Procedure: TOTAL HIP ARTHROPLASTY ANTERIOR APPROACH;  Surgeon: [name], MD;  Location: [facility];  Service: Orthopedics;  Laterality: Right;    Allergies  Allergen Reactions   Tramadol Other (See Comments)    "does not eat" LOSS OF APPETITE    Outpatient Encounter Medications as of 04/16/2022  Medication Sig   acetaminophen (TYLENOL) 500 MG tablet Take 1,000 mg by mouth as needed.   aspirin EC 81 MG tablet Take 81 mg by mouth daily. 8am.   atorvastatin (LIPITOR) 20 MG tablet Take 20 mg by mouth daily. 8pm.   Coenzyme Q10 (COQ10) 100 MG CAPS Take 1 capsule by mouth daily. 8am.   Cyanocobalamin (B-12) 1000 MCG TABS Take 1 tablet by mouth every other day. 8am.   fexofenadine (ALLEGRA) 180 MG tablet Take 180 mg by mouth daily as needed for allergies or rhinitis.   fluticasone (FLONASE) 50 MCG/ACT nasal spray Place 2 sprays into both nostrils as needed.    furosemide (LASIX) 20 MG tablet Take  20 mg by mouth daily.   gabapentin (NEURONTIN) 100 MG capsule Take 200 mg by mouth at bedtime. 8pm.   Glucosamine 500 MG CAPS Take by mouth. Take 3 pills to equal '1500mg'$  daily.   Hydroactive Dressings (DYNADERM HYDROCOLLOID 4"X4" EX) Apply topically every 3 (three) days. Cleanse dime-sized area on right buttocks with NSS, pat dry, apply  2X2 dressing, change every 3 days.   hydrocortisone 2.5 % cream Apply topically 2 (two) times daily as needed.   lactose free nutrition (BOOST) LIQD Take 237 mLs by mouth daily.   levothyroxine (SYNTHROID) 75 MCG tablet Take 75 mcg by mouth daily before breakfast.   meclizine (ANTIVERT) 25 MG tablet Take 25 mg by mouth 3 (three) times daily as needed for dizziness.   Menthol, Topical Analgesic, (BIOFREEZE) 4 % GEL Apply topically in the morning, at noon, in the evening, and at bedtime.   mirtazapine (REMERON SOL-TAB) 15 MG disintegrating tablet Take 0.5 tablets (7.5 mg total) by mouth at bedtime.   NAT-RUL PSYLLIUM SEED HUSKS PO Take 1 tablet by mouth daily. For constipation   Nutritional Supplements (RESOURCE 2.0) LIQD Take 180 mLs by mouth in the morning and at bedtime.   nystatin (MYCOSTATIN/NYSTOP) powder Apply 1 application  topically every other day. Apply to periwound and groin   omeprazole (PRILOSEC) 20 MG capsule Take 20 mg by mouth daily.   potassium chloride SA (KLOR-CON M) 20 MEQ tablet Take 20 mEq by mouth daily.   sennosides-docusate sodium (SENOKOT-S) 8.6-50 MG tablet Take 2 tablets by mouth daily.   sertraline (ZOLOFT) 25 MG tablet Take 1 tablet (25 mg total) by mouth daily.   tamsulosin (FLOMAX) 0.4 MG CAPS capsule Take 0.4 mg by mouth at bedtime.   No facility-administered encounter medications on file as of 04/16/2022.    Review of Systems  Constitutional:  Positive for unexpected weight change. Negative for fatigue and fever.       Weight loss  HENT:  Positive for hearing loss. Negative for congestion and voice change.   Eyes:  Negative for  visual disturbance.  Respiratory:  Negative for cough and shortness of breath.   Cardiovascular:  Negative for leg swelling.  Gastrointestinal:  Negative for abdominal pain, constipation, nausea and vomiting.       Comes and goes, not associated with urination or BM, no nausea or voiting  Genitourinary:  Positive for frequency. Negative for difficulty urinating and urgency.  Musculoskeletal:  Positive for arthralgias, back pain and gait problem.       R+L knees, R shoulder, s/p cervical spine+lumber spien surgeries. Mostly pain is in the R shoulder. ]  Skin:  Negative for color change.  Neurological:  Positive for weakness and numbness. Negative for speech difficulty.       Memory lapses. Tingling, numbness, weakness in legs. Involuntary lip movements.   Psychiatric/Behavioral:  Positive for behavioral problems and dysphoric mood. Negative for sleep disturbance. The patient is nervous/anxious.        Crying, sad facial looks.     Immunization History  Administered Date(s) Administered   Influenza-Unspecified 06/11/2015, 06/11/2019, 06/19/2020, 06/27/2021   Moderna Sars-Covid-2 Vaccination 09/10/2019, 10/08/2019, 07/17/2020, 02/05/2021   PFIZER(Purple Top)SARS-COV-2 Vaccination 09/10/2019   Pneumococcal Conjugate-13 09/12/2014   Pneumococcal Polysaccharide-23 04/21/2011   Pneumococcal-Unspecified 04/21/2019   Td 04/22/2002   Tetanus 05/16/2020   Unspecified SARS-COV-2 Vaccination 05/28/2021   Zoster Recombinat (Shingrix) 09/14/2021, 11/25/2021   Zoster, Live 04/27/2012   Pertinent  Health Maintenance Due  Topic Date Due   INFLUENZA VACCINE  04/08/2022      07/28/2016    3:05 PM 02/08/2018    1:54 PM 03/24/2019    3:10 PM  Fall Risk  Falls in the past year? Yes Yes 0  Was there an injury with Fall? No No 0  Fall Risk Category Calculator   0  Fall Risk Category  Low  Patient Fall Risk Level   Low fall risk  Patient at Risk for Falls Due to History of fall(s);Impaired  balance/gait;Impaired mobility    Fall risk Follow up Falls evaluation completed     Functional Status Survey:    Vitals:   04/16/22 0925  BP: 116/72  Pulse: 79  Resp: 20  Temp: 98.7 F (37.1 C)  SpO2: 93%  Weight: 164 lb (74.4 kg)  Height: '5\' 9"'$  (1.753 m)   Body mass index is 24.22 kg/m. Physical Exam Vitals and nursing note reviewed.  Constitutional:      Comments: frail  HENT:     Head: Normocephalic and atraumatic.     Mouth/Throat:     Mouth: Mucous membranes are moist.  Eyes:     Extraocular Movements: Extraocular movements intact.     Conjunctiva/sclera: Conjunctivae normal.     Pupils: Pupils are equal, round, and reactive to light.  Cardiovascular:     Rate and Rhythm: Normal rate and regular rhythm.     Heart sounds: No murmur heard. Pulmonary:     Effort: Pulmonary effort is normal.     Breath sounds: No rales.  Abdominal:     General: Bowel sounds are normal.     Palpations: Abdomen is soft.     Tenderness: There is no abdominal tenderness.  Musculoskeletal:        General: Tenderness present.     Cervical back: Normal range of motion and neck supple.     Right lower leg: No edema.     Left lower leg: No edema.     Comments: minimal edema BLE. R shoulder pain with ROM is chronic. Chronic R+L knee pain, knee support L knee. S/p R hips replacement. S/p cervical/lumbar spine surgeries.   Skin:    General: Skin is warm and dry.  Neurological:     General: No focal deficit present.     Mental Status: He is alert. Mental status is at baseline.     Motor: Weakness present.     Gait: Gait abnormal.     Comments: Moves slow, uses w/c for mobility,  lower body weakness, peripheral neuropathy in legs.   Psychiatric:     Comments: Crying upon my examination.      Labs reviewed: Recent Labs    02/24/22 0000 03/04/22 0000 03/15/22 0000  NA 141 136* 137  K 3.8 4.4 4.2  CL 109* 97* 101  CO2 27* 30* 27*  BUN '17 13 14  '$ CREATININE 0.7 0.8 0.7  CALCIUM  7.6* 8.3* 8.4*   Recent Labs    04/23/21 0000  AST 18  ALT 14  ALKPHOS 87  ALBUMIN 3.8   Recent Labs    02/24/22 0000 03/04/22 0000 03/15/22 0000  WBC 8.5 10.4 6.7  NEUTROABS 7,378.00 8,393.00 5,038.00  HGB 12.1* 12.9* 12.8*  HCT 37* 39* 41  PLT 81* 401* 280   Lab Results  Component Value Date   TSH 0.98 02/06/2022   No results found for: "HGBA1C" Lab Results  Component Value Date   CHOL 114 05/31/2021   HDL 25 (A) 05/31/2021   LDLCALC 66 05/31/2021   TRIG 149 05/31/2021   CHOLHDL 5.1 (H) 03/31/2019    Significant Diagnostic Results in last 30 days:  CT Abdomen Pelvis W Contrast  Result Date: 04/11/2022 CLINICAL DATA:  Unintended weight loss, disorientation EXAM: CT ABDOMEN AND PELVIS WITH CONTRAST TECHNIQUE: Multidetector CT imaging of the abdomen and pelvis was performed using the standard protocol  following bolus administration of intravenous contrast. RADIATION DOSE REDUCTION: This exam was performed according to the departmental dose-optimization program which includes automated exposure control, adjustment of the mA and/or kV according to patient size and/or use of iterative reconstruction technique. CONTRAST:  168m OMNIPAQUE IOHEXOL 300 MG/ML  SOLN COMPARISON:  None Available. FINDINGS: Lower chest: Minimal atelectasis or scarring within the right lower lobe. Trace right pleural effusion. Calcified left lower lobe granuloma. Moderate hiatal hernia. Hepatobiliary: The liver is unremarkable without focal abnormality. Gallbladder is moderately distended, with multiple noncalcified gallstones identified. There is mild gallbladder wall thickening and pericholecystic fat stranding, with loss of normal fat plane between the gallbladder neck, proximal duodenum, and gastric antrum. Differential diagnosis would include acute/chronic cholecystitis versus underlying neoplasm. Correlation with upper endoscopy/EUS and/or MRI may be useful. Pancreas: Unremarkable. No pancreatic ductal  dilatation or surrounding inflammatory changes. Spleen: Normal in size without focal abnormality. Adrenals/Urinary Tract: No urinary tract calculi or obstructive uropathy. No worrisome solid renal lesions. The adrenals and bladder are unremarkable. Stomach/Bowel: As discussed above, there is abnormal wall thickening involving the gastric antrum and proximal duodenum, obscuring the fat planes between the structures and the gallbladder neck. There is a possible mass involving the medial margin of the first portion duodenum, measuring 2.7 x 2.7 x 1.6 cm, reference axial image 35/3 and coronal image 43/6. There may be a small central ulceration seen on the coronal reconstruction. Again, correlation with upper endoscopy/EUS may be useful. There is no bowel obstruction or ileus. Normal appendix right lower quadrant. Moderate retained stool throughout the colon, most pronounced within the rectal vault, consistent with constipation. Vascular/Lymphatic: Aortic atherosclerosis. No enlarged abdominal or pelvic lymph nodes. Reproductive: Prostate is unremarkable. Other: No free fluid or free intraperitoneal gas. No abdominal wall hernia. Musculoskeletal: Anterior wedge compression deformity of L1 with greater than 75% loss of height and 7 mm of retropulsion appears chronic. No visible fracture line, and there is no paraspinal soft tissue swelling. No other acute bony abnormalities are identified. Unremarkable right hip arthroplasty. Reconstructed images demonstrate no additional findings. IMPRESSION: 1. Abnormal wall thickening along the medial aspect of the proximal duodenum, with loss of normal fat plane between the duodenum, gastric antrum, and gallbladder neck as above. Underlying neoplasm or duodenal ulcer and reactive change is suspected. Correlation with upper endoscopy may be useful. 2. Multiple noncalcified gallstones, with mild gallbladder wall thickening and pericholecystic fat stranding as above. Findings could  reflect secondary inflammatory changes related to the duodenal process described above, no acute or chronic cholecystitis could give a similar appearance. Please correlate with clinical presentation. Additional evaluation with MRI or nuclear medicine hepatobiliary scan could be performed for further characterization. 3. Moderate hiatal hernia. 4. Age indeterminate L1 compression fracture, likely chronic as stated above. 7 mm retropulsion results in central canal narrowing. 5. Significant colonic fecal retention consistent with constipation. 6.  Aortic Atherosclerosis (ICD10-I70.0). These results will be called to the ordering clinician or representative by the Radiologist Assistant, and communication documented in the PACS or [employer] Electronically Signed   By: MRanda NgoM.D.   On: 04/11/2022 17:59   CT HEAD W & WO CONTRAST (5MM)  Result Date: 04/11/2022 CLINICAL DATA:  Mental status change, unknown cause. Unintentional weight loss. Disorientation. EXAM: CT HEAD WITHOUT AND WITH CONTRAST TECHNIQUE: Contiguous axial images were obtained from the base of the skull through the vertex without and with intravenous contrast. RADIATION DOSE REDUCTION: This exam was performed according to the departmental dose-optimization program which includes  automated exposure control, adjustment of the mA and/or kV according to patient size and/or use of iterative reconstruction technique. CONTRAST:  145m OMNIPAQUE IOHEXOL 300 MG/ML  SOLN COMPARISON:  Noncontrast head CT 05/25/2017 FINDINGS: Brain: There is no evidence of an acute infarct, intracranial hemorrhage, mass, midline shift, or extra-axial fluid collection. Moderate cerebral atrophy has progressed from the prior study including somewhat prominent volume loss in the mesial temporal lobes. Hypodensities in the cerebral white matter bilaterally have progressed and are nonspecific but compatible with mild-to-moderate chronic small vessel ischemic disease. No  abnormal enhancement is identified. Vascular: Calcified atherosclerosis at the skull base. Gross patency of the dural venous sinuses and large intracranial arteries. Skull: No fracture or suspicious osseous lesion. Sinuses/Orbits: Visualized paranasal sinuses and mastoid air cells are clear. Unremarkable orbits. Other: None. IMPRESSION: 1. No evidence of acute intracranial abnormality or mass. 2. Progressive cerebral atrophy and chronic small vessel ischemic disease. Electronically Signed   By: ALogan BoresM.D.   On: 04/11/2022 14:51    Assessment/Plan Abnormal abdominal CT scan Weight loss: CT abd/pelvis with CM, Underlying neoplasm or duodenal ulcer and reactive change is suspected. Increased Omeprazole, started Sucralfate, dc'd ASA for abd pain  HPOA LAleksandar Duve will speak with her 2 sons to discuss plan of care. She is leaning toward palliative care, but would like to know about the disease process MRI vs GI consultation. 04/17/22 Dr. GLyndel Safe check Lipase, Hepatic panel, CA 19-9  Memory loss Dementia/Disorientation: CT head with CM, No evidence of acute intracranial abnormality or mass. Progressive cerebral atrophy and chronic small vessel ischemic disease.  Axonal sensorimotor neuropathy Peripheral neuropathy, weakness in legs, w/c for mobility, on Gabapentin. Takes Vit B12. Had nerve conduction study 03/10/18  Anxiety on Sertraline, Mirtazapine  Osteoarthritis, multiple sites   Left knee OA, R shoulder,  X-ray R hip/knee, f/u Ortho, prn Tylenol.   Urinary frequency stable, on Tamsulosin. underwent Urology evaluation in the past.   Hypothyroidism takes Levothyroxine, TSH 0.98 02/06/22  Edema of both lower legs due to peripheral venous insufficiency not apparent, off Furosemide.   Constipation CT abd showed stools,  takes Senokot S, adding MiraLax daily, start Fleet Enema daily x3 or until good result.   GERD (gastroesophageal reflux disease) Stable,  Since increased Omeprazole,  added Sucralfate,  Hgb 12.8 03/15/22  Vitamin D deficiency Vit D level 23, taking Vit D  Allergic rhinitis  takes Fluticasone nasal spray, Fexofenadine.   Vitamin B12 deficiency takes Vit B12  Hypercholesteremia LDL 66 05/31/21, takes Atorvastatin.      Family/ staff Communication: plan of care reviewed with the patient and charge nurse.   Labs/tests ordered:  Lipase, Hepatic panel, CA 19-9  Time spend 35 minutes.

## 2022-04-16 NOTE — Assessment & Plan Note (Signed)
Dementia/Disorientation: CT head with CM, No evidence of acute intracranial abnormality or mass. Progressive cerebral atrophy and chronic small vessel ischemic disease. 

## 2022-04-16 NOTE — Assessment & Plan Note (Signed)
Left knee OA, R shoulder,  X-ray R hip/knee, f/u Ortho, prn Tylenol.

## 2022-04-16 NOTE — Assessment & Plan Note (Signed)
on Sertraline, Mirtazapine

## 2022-04-16 NOTE — Assessment & Plan Note (Signed)
not apparent, off Furosemide.  

## 2022-04-16 NOTE — Assessment & Plan Note (Signed)
Vit D level 23, taking Vit D

## 2022-04-16 NOTE — Assessment & Plan Note (Signed)
Weight loss: CT abd/pelvis with CM, Underlying neoplasm or duodenal ulcer and reactive change is suspected. Increased Omeprazole, started Sucralfate, dc'd ASA for abd pain  HPOA Abdo Denault: will speak with her 2 sons to discuss plan of care. She is leaning toward palliative care, but would like to know about the disease process MRI vs GI consultation. 04/17/22 Dr. Lyndel Safe: check Lipase, Hepatic panel, CA 19-9

## 2022-04-16 NOTE — Assessment & Plan Note (Signed)
Stable,  Since increased Omeprazole, added Sucralfate,  Hgb 12.8 03/15/22

## 2022-04-16 NOTE — Assessment & Plan Note (Signed)
Peripheral neuropathy, weakness in legs, w/c for mobility, on Gabapentin. Takes Vit B12. Had nerve conduction study 03/10/18

## 2022-04-16 NOTE — Assessment & Plan Note (Signed)
stable, on Tamsulosin. underwent Urology evaluation in the past. 

## 2022-04-16 NOTE — Assessment & Plan Note (Signed)
takes Vit B12 

## 2022-04-18 DIAGNOSIS — R978 Other abnormal tumor markers: Secondary | ICD-10-CM | POA: Diagnosis not present

## 2022-04-18 DIAGNOSIS — E860 Dehydration: Secondary | ICD-10-CM | POA: Diagnosis not present

## 2022-04-18 DIAGNOSIS — I10 Essential (primary) hypertension: Secondary | ICD-10-CM | POA: Diagnosis not present

## 2022-04-18 DIAGNOSIS — K859 Acute pancreatitis without necrosis or infection, unspecified: Secondary | ICD-10-CM | POA: Diagnosis not present

## 2022-04-18 DIAGNOSIS — R945 Abnormal results of liver function studies: Secondary | ICD-10-CM | POA: Diagnosis not present

## 2022-04-18 DIAGNOSIS — R1901 Right upper quadrant abdominal swelling, mass and lump: Secondary | ICD-10-CM | POA: Diagnosis not present

## 2022-04-21 ENCOUNTER — Non-Acute Institutional Stay (SKILLED_NURSING_FACILITY): Payer: Medicare PPO | Admitting: Nurse Practitioner

## 2022-04-21 ENCOUNTER — Encounter: Payer: Self-pay | Admitting: Nurse Practitioner

## 2022-04-21 DIAGNOSIS — E039 Hypothyroidism, unspecified: Secondary | ICD-10-CM | POA: Diagnosis not present

## 2022-04-21 DIAGNOSIS — R600 Localized edema: Secondary | ICD-10-CM

## 2022-04-21 DIAGNOSIS — R413 Other amnesia: Secondary | ICD-10-CM

## 2022-04-21 DIAGNOSIS — K5900 Constipation, unspecified: Secondary | ICD-10-CM

## 2022-04-21 DIAGNOSIS — E559 Vitamin D deficiency, unspecified: Secondary | ICD-10-CM

## 2022-04-21 DIAGNOSIS — J309 Allergic rhinitis, unspecified: Secondary | ICD-10-CM

## 2022-04-21 DIAGNOSIS — I872 Venous insufficiency (chronic) (peripheral): Secondary | ICD-10-CM

## 2022-04-21 DIAGNOSIS — G6289 Other specified polyneuropathies: Secondary | ICD-10-CM | POA: Diagnosis not present

## 2022-04-21 DIAGNOSIS — E7800 Pure hypercholesterolemia, unspecified: Secondary | ICD-10-CM

## 2022-04-21 DIAGNOSIS — M48061 Spinal stenosis, lumbar region without neurogenic claudication: Secondary | ICD-10-CM | POA: Diagnosis not present

## 2022-04-21 DIAGNOSIS — F419 Anxiety disorder, unspecified: Secondary | ICD-10-CM

## 2022-04-21 DIAGNOSIS — R350 Frequency of micturition: Secondary | ICD-10-CM | POA: Diagnosis not present

## 2022-04-21 DIAGNOSIS — R634 Abnormal weight loss: Secondary | ICD-10-CM | POA: Diagnosis not present

## 2022-04-21 DIAGNOSIS — R935 Abnormal findings on diagnostic imaging of other abdominal regions, including retroperitoneum: Secondary | ICD-10-CM | POA: Diagnosis not present

## 2022-04-21 DIAGNOSIS — I10 Essential (primary) hypertension: Secondary | ICD-10-CM | POA: Diagnosis not present

## 2022-04-21 DIAGNOSIS — E538 Deficiency of other specified B group vitamins: Secondary | ICD-10-CM

## 2022-04-21 DIAGNOSIS — K219 Gastro-esophageal reflux disease without esophagitis: Secondary | ICD-10-CM

## 2022-04-21 LAB — COMPREHENSIVE METABOLIC PANEL
Albumin: 3.1 — AB (ref 3.5–5.0)
Calcium: 8.4 — AB (ref 8.7–10.7)
Globulin: 2.7
eGFR: 85

## 2022-04-21 LAB — HEPATIC FUNCTION PANEL
ALT: 16 U/L (ref 10–40)
AST: 24 (ref 14–40)
Alkaline Phosphatase: 87 (ref 25–125)
Bilirubin, Total: 0.6

## 2022-04-21 LAB — BASIC METABOLIC PANEL
BUN: 15 (ref 4–21)
CO2: 30 — AB (ref 13–22)
Chloride: 105 (ref 99–108)
Creatinine: 0.8 (ref 0.6–1.3)
Glucose: 87
Potassium: 4.1 mEq/L (ref 3.5–5.1)
Sodium: 141 (ref 137–147)

## 2022-04-21 NOTE — Assessment & Plan Note (Signed)
takes Vit B12 

## 2022-04-21 NOTE — Assessment & Plan Note (Signed)
stable, on Tamsulosin. underwent Urology evaluation in the past. 

## 2022-04-21 NOTE — Assessment & Plan Note (Signed)
takes Fluticasone nasal spray, Fexofenadine.  

## 2022-04-21 NOTE — Assessment & Plan Note (Signed)
LDL 66 05/31/21, takes Atorvastatin.

## 2022-04-21 NOTE — Assessment & Plan Note (Signed)
Vit D level 23, taking Vit D

## 2022-04-21 NOTE — Assessment & Plan Note (Signed)
takes Senokot S, MiraLax. 

## 2022-04-21 NOTE — Assessment & Plan Note (Signed)
Left knee OA, R shoulder,  X-ray R hip/knee, f/u Ortho, prn Tylenol.

## 2022-04-21 NOTE — Assessment & Plan Note (Signed)
Dementia/Disorientation: CT head with CM, No evidence of acute intracranial abnormality or mass. Progressive cerebral atrophy and chronic small vessel ischemic disease. 

## 2022-04-21 NOTE — Assessment & Plan Note (Signed)
takes Levothyroxine, TSH 0.98 02/06/22

## 2022-04-21 NOTE — Assessment & Plan Note (Signed)
not apparent, off Furosemide.  

## 2022-04-21 NOTE — Assessment & Plan Note (Signed)
takes Omeprazole, Sucralfate, Hgb 12.8 03/15/22

## 2022-04-21 NOTE — Progress Notes (Signed)
Location:   SNF [facility] Room Number: 16 Place of Service:  SNF (31) Provider: Lennie Odor Maritssa Haughton NP  Lorance Pickeral X, NP  Patient Care Team: Elohim Brune X, NP as PCP - General (Internal Medicine) [name], [name], MD as Consulting Physician (Orthopedic Surgery) [name], MD as Consulting Physician (Gastroenterology) System, Provider Not In [name], MD as Consulting Physician (Neurosurgery) [name], MD as Referring Physician (Ophthalmology) Yuepheng Schaller X, NP as Nurse Practitioner (Internal Medicine) Almedia Balls, MD as Referring Physician (Orthopedic Surgery) [name], MD as Consulting Physician (Neurosurgery) Elsie Saas, MD as Consulting Physician (Orthopedic Surgery) Allyn Kenner, MD (Dermatology) Kathrynn Ducking, MD (Inactive) as Consulting Physician (Neurology) Franchot Gallo, MD as Consulting Physician (Urology)  Extended Emergency Contact Information Primary Emergency Contact: [name] Address: [address]          [place], [place] [address] [place] Phone: [phone] Relation: Spouse Secondary Emergency Contact: [place] Mobile Phone: [phone] Relation: Relative  Code Status: DNR Goals of care: Advanced Directive information    04/16/2022    9:19 AM  Advanced Directives  Does Patient Have a Medical Advance Directive? Yes  Type of Paramedic of [place];Living will;Out of facility DNR (pink MOST or yellow form)  Does patient want to make changes to medical advance directive? No - Patient declined  Copy of [place] in Chart? Yes - validated most recent copy scanned in chart (See row information)  Pre-existing out of facility DNR order (yellow form or pink MOST form) Pink MOST form placed in chart (order not valid for inpatient use)     Chief Complaint  Patient presents with  . Acute Visit    HPOA son desires trial of IVF x1  2/2 poor oral intake.     HPI:  Pt is a 88 y.o. male seen today for an acute visit for persisted poor oral intake, the patient's son, HPOA related to social worker to at least trying IVF until further decision MRI  Weight loss: CT abd/pelvis with CM, Underlying neoplasm or duodenal ulcer and reactive change is suspected. Increased Omeprazole, started Sucralfate, dc'd ASA for abd pain   Abnormal CT abd, unremarkable lipase, hepatic panel, CA 19-9             HPOA Arnav Cregg: is leaning toward palliative care, but would like to know about the disease process MRI              Dementia/Disorientation: CT head with CM, No evidence of acute intracranial abnormality or mass. Progressive cerebral atrophy and chronic small vessel ischemic disease. Peripheral neuropathy, weakness in legs, w/c for mobility, on Gabapentin. Takes Vit B12. Had nerve conduction study 03/10/18             Anxiety/depression, on Sertraline, Mirtazapine             Left knee OA, R shoulder,  X-ray R hip/knee, f/u Ortho, prn Tylenol.              Urinary frequency, stable, on Tamsulosin. underwent Urology evaluation in the past.              Hypothyroidism, takes Levothyroxine, TSH 0.98 02/06/22             Edema BLE, not apparent, off Furosemide.              Constipation, takes Senokot S, MiraLax  GERD, takes Omeprazole, Sucralfate, Hgb 12.8 03/15/22             Vit D deficiency, Vit D level 23, taking Vit D             Allergic rhinitis, takes Fluticasone nasal spray, Fexofenadine.              Vit B12 deficiency, takes Vit B12             Hyperlipidemia, LDL 66 05/31/21, takes Atorvastatin.    Past Medical History:  Diagnosis Date  . Abnormal MRI, knee    Per [facility] New Patient Packet   . Anemia   . Anticoagulated   . Anxiety   . Arthritis   . Cervical disc disorder    Per [facility] Patient Packet   . Complication of anesthesia    states "I was [place] weeks after surgery"caused by tramadol   . Constipation   . Dysuria 07/28/2016  . Edema 07/24/2016  . Gait abnormality 07/25/2016  . Hard of hearing   . Head injury    As a result of a fall, Per [facility] New Patient Packet   . Hip arthritis 07/21/2016  . History of fall 07/25/2016  . Hypercholesteremia   . Hypertension   . Lumbar spinal stenosis   . Memory loss 07/28/2016   mild  . Obesity    Per [facility] New Patient Packet   . Osteoarthritis of right hip 07/24/2016  . Peripheral neuropathy 03/10/2018  . Sleep apnea    cpap  . Spinal stenosis of lumbar region 05/28/2016  . Urinary frequency   . Venous thrombosis of leg 07/24/2016   right gastrocnemius  . Vertigo   . Wears glasses   . Weight loss 04/08/2016   Past Surgical History:  Procedure Laterality Date  . BACK SURGERY  10/17, 80's  . COLONOSCOPY    . COLONOSCOPY     Per [facility] New Patient Packet, [place]   . ESOPHAGOGASTRODUODENOSCOPY    . LUMBAR LAMINECTOMY/DECOMPRESSION MICRODISCECTOMY Right 05/28/2016   Procedure: Right Lumbar Three-Four Microdiscectomy;  Surgeon: [name], MD;  Location: [facility];  Service: Neurosurgery;  Laterality: Right;  . TOTAL HIP ARTHROPLASTY Right 07/21/2016   Procedure: TOTAL HIP ARTHROPLASTY ANTERIOR APPROACH;  Surgeon: [name], MD;  Location: [facility];  Service: Orthopedics;  Laterality: Right;    Allergies  Allergen Reactions  . Tramadol Other (See Comments)    "does not eat" LOSS OF APPETITE    Allergies as of 04/21/2022       Reactions   Tramadol Other (See Comments)   "does not eat" LOSS OF APPETITE        Medication List        Accurate as of April 21, 2022  4:48 PM. If you have any questions, ask your nurse or doctor.          acetaminophen 500 MG tablet Commonly known as: TYLENOL Take 1,000 mg by mouth as needed.   aspirin EC 81 MG tablet Take 81 mg by mouth daily. 8am.   atorvastatin 20 MG tablet Commonly known as: LIPITOR Take 20 mg by mouth daily. 8pm.   B-12 1000 MCG Tabs Take 1  tablet by mouth every other day. 8am.   Biofreeze 4 % Gel Generic drug: Menthol (Topical Analgesic) Apply topically in the morning, at noon, in the evening, and at bedtime.   CoQ10 100 MG Caps Take 1 capsule by mouth daily. 8am.  DYNADERM HYDROCOLLOID 4"X4" EX Apply topically every 3 (three) days. Cleanse dime-sized area on right buttocks with NSS, pat dry, apply 2X2 dressing, change every 3 days.   fexofenadine 180 MG tablet Commonly known as: ALLEGRA Take 180 mg by mouth daily as needed for allergies or rhinitis.   fluticasone 50 MCG/ACT nasal spray Commonly known as: FLONASE Place 2 sprays into both nostrils as needed.   furosemide 20 MG tablet Commonly known as: LASIX Take 20 mg by mouth daily.   gabapentin 100 MG capsule Commonly known as: NEURONTIN Take 200 mg by mouth at bedtime. 8pm.   Glucosamine 500 MG Caps Take by mouth. Take 3 pills to equal 152m daily.   hydrocortisone 2.5 % cream Apply topically 2 (two) times daily as needed.   lactose free nutrition Liqd Take 237 mLs by mouth daily.   Resource 2.0 Liqd Take 180 mLs by mouth in the morning and at bedtime.   levothyroxine 75 MCG tablet Commonly known as: SYNTHROID Take 75 mcg by mouth daily before breakfast.   meclizine 25 MG tablet Commonly known as: ANTIVERT Take 25 mg by mouth 3 (three) times daily as needed for dizziness.   mirtazapine 15 MG disintegrating tablet Commonly known as: REMERON SOL-TAB Take 0.5 tablets (7.5 mg total) by mouth at bedtime.   NAT-RUL PSYLLIUM SEED HUSKS PO Take 1 tablet by mouth daily. For constipation   nystatin powder Commonly known as: MYCOSTATIN/NYSTOP Apply 1 application  topically every other day. Apply to periwound and groin   omeprazole 20 MG capsule Commonly known as: PRILOSEC Take 20 mg by mouth daily.   potassium chloride SA 20 MEQ tablet Commonly known as: KLOR-CON M Take 20 mEq by mouth daily.   sennosides-docusate sodium 8.6-50 MG  tablet Commonly known as: SENOKOT-S Take 2 tablets by mouth daily.   sertraline 25 MG tablet Commonly known as: ZOLOFT Take 1 tablet (25 mg total) by mouth daily.   tamsulosin 0.4 MG Caps capsule Commonly known as: FLOMAX Take 0.4 mg by mouth at bedtime.        Review of Systems  Constitutional:  Positive for unexpected weight change. Negative for fatigue and fever.       Weight loss  HENT:  Positive for hearing loss. Negative for congestion and voice change.   Eyes:  Negative for visual disturbance.  Respiratory:  Negative for cough and shortness of breath.   Cardiovascular:  Negative for leg swelling.  Gastrointestinal:  Negative for abdominal pain, constipation, nausea and vomiting.  Genitourinary:  Positive for frequency. Negative for difficulty urinating and urgency.  Musculoskeletal:  Positive for arthralgias, back pain and gait problem.       R+L knees, R shoulder, s/p cervical spine+lumber spien surgeries. Mostly pain is in the R shoulder. ]  Skin:  Negative for color change.  Neurological:  Positive for weakness and numbness. Negative for speech difficulty.       Memory lapses. Tingling, numbness, weakness in legs. Involuntary lip movements.   Psychiatric/Behavioral:  Positive for behavioral problems and dysphoric mood. Negative for sleep disturbance. The patient is nervous/anxious.        Better facial looks, smile    Immunization History  Administered Date(s) Administered  . Influenza-Unspecified 06/11/2015, 06/11/2019, 06/19/2020, 06/27/2021  . Moderna Sars-Covid-2 Vaccination 09/10/2019, 10/08/2019, 07/17/2020, 02/05/2021  . PFIZER(Purple Top)SARS-COV-2 Vaccination 09/10/2019  . Pneumococcal Conjugate-13 09/12/2014  . Pneumococcal Polysaccharide-23 04/21/2011  . Pneumococcal-Unspecified 04/21/2019  . Td 04/22/2002  . Tetanus 05/16/2020  . Unspecified SARS-COV-2 Vaccination 05/28/2021  .  Zoster Recombinat (Shingrix) 09/14/2021, 11/25/2021  . Zoster, Live  04/27/2012   Pertinent  Health Maintenance Due  Topic Date Due  . INFLUENZA VACCINE  04/08/2022      07/28/2016    3:05 PM 02/08/2018    1:54 PM 03/24/2019    3:10 PM  Fall Risk  Falls in the past year? Yes Yes 0  Was there an injury with Fall? No No 0  Fall Risk Category Calculator   0  Fall Risk Category   Low  Patient Fall Risk Level   Low fall risk  Patient at Risk for Falls Due to History of fall(s);Impaired balance/gait;Impaired mobility    Fall risk Follow up Falls evaluation completed     Functional Status Survey:    Vitals:   04/21/22 1532  BP: 116/72  Pulse: 79  Resp: 18  Temp: 97.6 F (36.4 C)  SpO2: 93%   There is no height or weight on file to calculate BMI. Physical Exam Vitals and nursing note reviewed.  Constitutional:      Comments: frail  HENT:     Head: Normocephalic and atraumatic.     Mouth/Throat:     Mouth: Mucous membranes are dry.  Eyes:     Extraocular Movements: Extraocular movements intact.     Conjunctiva/sclera: Conjunctivae normal.     Pupils: Pupils are equal, round, and reactive to light.  Cardiovascular:     Rate and Rhythm: Normal rate and regular rhythm.     Heart sounds: No murmur heard. Pulmonary:     Effort: Pulmonary effort is normal.     Breath sounds: No rales.  Abdominal:     General: Bowel sounds are normal.     Palpations: Abdomen is soft.     Tenderness: There is no abdominal tenderness.  Musculoskeletal:        General: Tenderness present.     Cervical back: Normal range of motion and neck supple.     Right lower leg: No edema.     Left lower leg: No edema.     Comments: minimal edema BLE. R shoulder pain with ROM is chronic. Chronic R+L knee pain, knee support L knee. S/p R hips replacement. S/p cervical/lumbar spine surgeries.   Skin:    General: Skin is warm and dry.  Neurological:     General: No focal deficit present.     Mental Status: He is alert. Mental status is at baseline.     Motor: Weakness  present.     Gait: Gait abnormal.     Comments: Moves slow, uses w/c for mobility,  lower body weakness, peripheral neuropathy in legs.   Psychiatric:        Mood and Affect: Mood normal.    Labs reviewed: Recent Labs    02/24/22 0000 03/04/22 0000 03/15/22 0000  NA 141 136* 137  K 3.8 4.4 4.2  CL 109* 97* 101  CO2 27* 30* 27*  BUN '17 13 14  ' CREATININE 0.7 0.8 0.7  CALCIUM 7.6* 8.3* 8.4*   Recent Labs    04/23/21 0000  AST 18  ALT 14  ALKPHOS 87  ALBUMIN 3.8   Recent Labs    02/24/22 0000 03/04/22 0000 03/15/22 0000  WBC 8.5 10.4 6.7  NEUTROABS 7,378.00 8,393.00 5,038.00  HGB 12.1* 12.9* 12.8*  HCT 37* 39* 41  PLT 81* 401* 280   Lab Results  Component Value Date   TSH 0.98 02/06/2022   No results found for: "HGBA1C" Lab Results  Component Value Date   CHOL 114 05/31/2021   HDL 25 (A) 05/31/2021   LDLCALC 66 05/31/2021   TRIG 149 05/31/2021   CHOLHDL 5.1 (H) 03/31/2019    Significant Diagnostic Results in last 30 days:  CT Abdomen Pelvis W Contrast  Result Date: 04/11/2022 CLINICAL DATA:  Unintended weight loss, disorientation EXAM: CT ABDOMEN AND PELVIS WITH CONTRAST TECHNIQUE: Multidetector CT imaging of the abdomen and pelvis was performed using the standard protocol following bolus administration of intravenous contrast. RADIATION DOSE REDUCTION: This exam was performed according to the departmental dose-optimization program which includes automated exposure control, adjustment of the mA and/or kV according to patient size and/or use of iterative reconstruction technique. CONTRAST:  162m OMNIPAQUE IOHEXOL 300 MG/ML  SOLN COMPARISON:  None Available. FINDINGS: Lower chest: Minimal atelectasis or scarring within the right lower lobe. Trace right pleural effusion. Calcified left lower lobe granuloma. Moderate hiatal hernia. Hepatobiliary: The liver is unremarkable without focal abnormality. Gallbladder is moderately distended, with multiple noncalcified  gallstones identified. There is mild gallbladder wall thickening and pericholecystic fat stranding, with loss of normal fat plane between the gallbladder neck, proximal duodenum, and gastric antrum. Differential diagnosis would include acute/chronic cholecystitis versus underlying neoplasm. Correlation with upper endoscopy/EUS and/or MRI may be useful. Pancreas: Unremarkable. No pancreatic ductal dilatation or surrounding inflammatory changes. Spleen: Normal in size without focal abnormality. Adrenals/Urinary Tract: No urinary tract calculi or obstructive uropathy. No worrisome solid renal lesions. The adrenals and bladder are unremarkable. Stomach/Bowel: As discussed above, there is abnormal wall thickening involving the gastric antrum and proximal duodenum, obscuring the fat planes between the structures and the gallbladder neck. There is a possible mass involving the medial margin of the first portion duodenum, measuring 2.7 x 2.7 x 1.6 cm, reference axial image 35/3 and coronal image 43/6. There may be a small central ulceration seen on the coronal reconstruction. Again, correlation with upper endoscopy/EUS may be useful. There is no bowel obstruction or ileus. Normal appendix right lower quadrant. Moderate retained stool throughout the colon, most pronounced within the rectal vault, consistent with constipation. Vascular/Lymphatic: Aortic atherosclerosis. No enlarged abdominal or pelvic lymph nodes. Reproductive: Prostate is unremarkable. Other: No free fluid or free intraperitoneal gas. No abdominal wall hernia. Musculoskeletal: Anterior wedge compression deformity of L1 with greater than 75% loss of height and 7 mm of retropulsion appears chronic. No visible fracture line, and there is no paraspinal soft tissue swelling. No other acute bony abnormalities are identified. Unremarkable right hip arthroplasty. Reconstructed images demonstrate no additional findings. IMPRESSION: 1. Abnormal wall thickening along  the medial aspect of the proximal duodenum, with loss of normal fat plane between the duodenum, gastric antrum, and gallbladder neck as above. Underlying neoplasm or duodenal ulcer and reactive change is suspected. Correlation with upper endoscopy may be useful. 2. Multiple noncalcified gallstones, with mild gallbladder wall thickening and pericholecystic fat stranding as above. Findings could reflect secondary inflammatory changes related to the duodenal process described above, no acute or chronic cholecystitis could give a similar appearance. Please correlate with clinical presentation. Additional evaluation with MRI or nuclear medicine hepatobiliary scan could be performed for further characterization. 3. Moderate hiatal hernia. 4. Age indeterminate L1 compression fracture, likely chronic as stated above. 7 mm retropulsion results in central canal narrowing. 5. Significant colonic fecal retention consistent with constipation. 6.  Aortic Atherosclerosis (ICD10-I70.0). These results will be called to the ordering clinician or representative by the Radiologist Assistant, and communication documented in the PACS or [employer] Electronically Signed  By: Randa Ngo M.D.   On: 04/11/2022 17:59   CT HEAD W & WO CONTRAST (5MM)  Result Date: 04/11/2022 CLINICAL DATA:  Mental status change, unknown cause. Unintentional weight loss. Disorientation. EXAM: CT HEAD WITHOUT AND WITH CONTRAST TECHNIQUE: Contiguous axial images were obtained from the base of the skull through the vertex without and with intravenous contrast. RADIATION DOSE REDUCTION: This exam was performed according to the departmental dose-optimization program which includes automated exposure control, adjustment of the mA and/or kV according to patient size and/or use of iterative reconstruction technique. CONTRAST:  135m OMNIPAQUE IOHEXOL 300 MG/ML  SOLN COMPARISON:  Noncontrast head CT 05/25/2017 FINDINGS: Brain: There is no evidence of an  acute infarct, intracranial hemorrhage, mass, midline shift, or extra-axial fluid collection. Moderate cerebral atrophy has progressed from the prior study including somewhat prominent volume loss in the mesial temporal lobes. Hypodensities in the cerebral white matter bilaterally have progressed and are nonspecific but compatible with mild-to-moderate chronic small vessel ischemic disease. No abnormal enhancement is identified. Vascular: Calcified atherosclerosis at the skull base. Gross patency of the dural venous sinuses and large intracranial arteries. Skull: No fracture or suspicious osseous lesion. Sinuses/Orbits: Visualized paranasal sinuses and mastoid air cells are clear. Unremarkable orbits. Other: None. IMPRESSION: 1. No evidence of acute intracranial abnormality or mass. 2. Progressive cerebral atrophy and chronic small vessel ischemic disease. Electronically Signed   By: ALogan BoresM.D.   On: 04/11/2022 14:51    Assessment/Plan: Weight loss  persisted poor oral intake, the patient's son, HPOA related to social worker to at least try IVF until MRI  Weight loss: CT abd/pelvis with CM, Underlying neoplasm or duodenal ulcer and reactive change is suspected. Increased Omeprazole, started Sucralfate, dc'd ASA for abd pain-better  Will obtain CMP/eGFR  Abnormal abdominal CT scan Abnormal CT abd, unremarkable lipase, hepatic panel, CA 19-9   HPOA LZach Tietje  is leaning toward palliative care, but would like to know about the disease process, desires MRI abd  Urgent GI consult, [facility], [facility] office.   Memory loss Dementia/Disorientation: CT head with CM, No evidence of acute intracranial abnormality or mass. Progressive cerebral atrophy and chronic small vessel ischemic disease.  Axonal sensorimotor neuropathy Peripheral neuropathy, weakness in legs, w/c for mobility, on Gabapentin. Takes Vit B12. Had nerve conduction study 03/10/18  Anxiety His mood is stabilizing, on Sertraline,  Mirtazapine  Lumbar spinal stenosis  Left knee OA, R shoulder,  X-ray R hip/knee, f/u Ortho, prn Tylenol.   Urinary frequency  stable, on Tamsulosin. underwent Urology evaluation in the past.   Hypothyroidism takes Levothyroxine, TSH 0.98 02/06/22  Edema of both lower legs due to peripheral venous insufficiency not apparent, off Furosemide.   Constipation takes Senokot S, MiraLax  GERD (gastroesophageal reflux disease) takes Omeprazole, Sucralfate, Hgb 12.8 03/15/22  Vitamin D deficiency Vit D level 23, taking Vit D  Allergic rhinitis  takes Fluticasone nasal spray, Fexofenadine.   Vitamin B12 deficiency  takes Vit B12  Hypercholesteremia LDL 66 05/31/21, takes Atorvastatin.     Family/ staff Communication: plan of care reviewed with the patient and charge nurse.   Labs/tests ordered:  CMP/eGFR, MRI abd w/wo CM   Time spend 35 minutes.

## 2022-04-21 NOTE — Assessment & Plan Note (Addendum)
Abnormal CT abd, unremarkable lipase, hepatic panel, CA 19-9   HPOA [name]:  is leaning toward palliative care, but would like to know about the disease process, desires MRI abd  Urgent GI consult, [facility], [facility].

## 2022-04-21 NOTE — Assessment & Plan Note (Signed)
His mood is stabilizing, on Sertraline, Mirtazapine

## 2022-04-21 NOTE — Assessment & Plan Note (Signed)
Peripheral neuropathy, weakness in legs, w/c for mobility, on Gabapentin. Takes Vit B12. Had nerve conduction study 03/10/18

## 2022-04-21 NOTE — Assessment & Plan Note (Addendum)
persisted poor oral intake, the patient's son, HPOA related to social worker to at least try IVF until MRI  Weight loss: CT abd/pelvis with CM, Underlying neoplasm or duodenal ulcer and reactive change is suspected. Increased Omeprazole, started Sucralfate, dc'd ASA for abd pain-better  Will obtain CMP/eGFR  04/21/22 Na 141, K 4.1, Bun 15, creat 0.79  04/22/22 continue to encourage oral intake

## 2022-04-24 ENCOUNTER — Telehealth: Payer: Self-pay | Admitting: Gastroenterology

## 2022-04-24 NOTE — Telephone Encounter (Signed)
Hi Dr. Havery Moros,   We received a referral for a patient to be seen for weight loss, the patient current resides at [facility]. Records are available in Epic to review and advise on scheduling.    Thank you

## 2022-04-25 NOTE — Telephone Encounter (Signed)
Urgent referral for possible malignancy noted on CT scan. We can see him, offer first available appointment with APP or any GI MD. Hopefully if any cancellations in the schedule he can be seen within a week. However, this patient appears to have seen Dr. Michail Sermon in the past of [facility]. If he is established there he can also call their office if they may have a sooner appointment

## 2022-04-28 ENCOUNTER — Non-Acute Institutional Stay (SKILLED_NURSING_FACILITY): Payer: Medicare PPO | Admitting: Nurse Practitioner

## 2022-04-28 ENCOUNTER — Encounter: Payer: Self-pay | Admitting: Nurse Practitioner

## 2022-04-28 DIAGNOSIS — M159 Polyosteoarthritis, unspecified: Secondary | ICD-10-CM

## 2022-04-28 DIAGNOSIS — R350 Frequency of micturition: Secondary | ICD-10-CM

## 2022-04-28 DIAGNOSIS — E7800 Pure hypercholesterolemia, unspecified: Secondary | ICD-10-CM

## 2022-04-28 DIAGNOSIS — R935 Abnormal findings on diagnostic imaging of other abdominal regions, including retroperitoneum: Secondary | ICD-10-CM | POA: Diagnosis not present

## 2022-04-28 DIAGNOSIS — K5900 Constipation, unspecified: Secondary | ICD-10-CM

## 2022-04-28 DIAGNOSIS — F419 Anxiety disorder, unspecified: Secondary | ICD-10-CM | POA: Diagnosis not present

## 2022-04-28 DIAGNOSIS — E039 Hypothyroidism, unspecified: Secondary | ICD-10-CM | POA: Diagnosis not present

## 2022-04-28 DIAGNOSIS — R413 Other amnesia: Secondary | ICD-10-CM | POA: Diagnosis not present

## 2022-04-28 DIAGNOSIS — E538 Deficiency of other specified B group vitamins: Secondary | ICD-10-CM

## 2022-04-28 DIAGNOSIS — K219 Gastro-esophageal reflux disease without esophagitis: Secondary | ICD-10-CM

## 2022-04-28 DIAGNOSIS — G6289 Other specified polyneuropathies: Secondary | ICD-10-CM | POA: Diagnosis not present

## 2022-04-28 DIAGNOSIS — I872 Venous insufficiency (chronic) (peripheral): Secondary | ICD-10-CM

## 2022-04-28 DIAGNOSIS — R634 Abnormal weight loss: Secondary | ICD-10-CM | POA: Diagnosis not present

## 2022-04-28 DIAGNOSIS — R600 Localized edema: Secondary | ICD-10-CM

## 2022-04-28 DIAGNOSIS — J309 Allergic rhinitis, unspecified: Secondary | ICD-10-CM

## 2022-04-28 DIAGNOSIS — E559 Vitamin D deficiency, unspecified: Secondary | ICD-10-CM

## 2022-04-28 NOTE — Assessment & Plan Note (Signed)
CT head with CM, No evidence of acute intracranial abnormality or mass. Progressive cerebral atrophy and chronic small vessel ischemic disease. 

## 2022-04-28 NOTE — Assessment & Plan Note (Signed)
LDL 66 05/31/21, takes Atorvastatin.

## 2022-04-28 NOTE — Assessment & Plan Note (Signed)
Left knee OA, R shoulder,  X-ray R hip/knee, f/u Ortho, prn Tylenol.

## 2022-04-28 NOTE — Assessment & Plan Note (Signed)
takes Omeprazole, Sucralfate, Hgb 12.8 03/15/22

## 2022-04-28 NOTE — Assessment & Plan Note (Signed)
weakness in legs, w/c for mobility, on Gabapentin. Takes Vit B12. Had nerve conduction study 03/10/18

## 2022-04-28 NOTE — Assessment & Plan Note (Signed)
takes Fluticasone nasal spray, Fexofenadine.  

## 2022-04-28 NOTE — Assessment & Plan Note (Signed)
not apparent, off Furosemide.  

## 2022-04-28 NOTE — Assessment & Plan Note (Signed)
Anxious, irritable, on Sertraline, Mirtazapine, prn Hydroxyzine since 04/22/22, dc'd Zyprexa 02/25/22. Will try adding Seroquel 12.'5mg'$  bid, observe.

## 2022-04-28 NOTE — Assessment & Plan Note (Signed)
CT abd/pelvis with CM, Underlying neoplasm or duodenal ulcer and reactive change is suspected. Increased Omeprazole, started Sucralfate, dc'd ASA for abd pain  Na 141, K 4.1, Bun 15, creat 0.79 04/21/22

## 2022-04-28 NOTE — Assessment & Plan Note (Signed)
Vit D level 23, taking Vit D

## 2022-04-28 NOTE — Assessment & Plan Note (Signed)
takes Senokot S, MiraLax. 

## 2022-04-28 NOTE — Assessment & Plan Note (Signed)
Abnormal CT abd, unremarkable lipase, hepatic panel, CA 19-9. Pending f/u GI, abd MRI 05/01/22 GI cancel MRI abd.

## 2022-04-28 NOTE — Assessment & Plan Note (Signed)
takes Levothyroxine, TSH 0.98 02/06/22

## 2022-04-28 NOTE — Assessment & Plan Note (Signed)
takes Vit B12 

## 2022-04-28 NOTE — Progress Notes (Signed)
Location:   SNF [place] Room Number: [id number] Place of Service:  SNF (31) Provider: Lennie Odor Carsten Carstarphen NP  Avery Klingbeil X, NP  Patient Care Team: Danikah Budzik X, NP as PCP - General (Internal Medicine) [name], [name], MD as Consulting Physician (Orthopedic Surgery) [name], MD as Consulting Physician (Gastroenterology) System, Provider Not In [name], MD as Consulting Physician (Neurosurgery) [name], MD as Referring Physician (Ophthalmology) Lucifer Soja X, NP as Nurse Practitioner (Internal Medicine) Almedia Balls, MD as Referring Physician (Orthopedic Surgery) [name], MD as Consulting Physician (Neurosurgery) Elsie Saas, MD as Consulting Physician (Orthopedic Surgery) Allyn Kenner, MD (Dermatology) Kathrynn Ducking, MD (Inactive) as Consulting Physician (Neurology) Franchot Gallo, MD as Consulting Physician (Urology)  Extended Emergency Contact Information Primary Emergency Contact: [name] Address: [address]          [place], [place] [address] [place] Phone: [phone] Relation: Spouse Secondary Emergency Contact: [place] Phone: [phone] Relation: Relative  Code Status: DNR Goals of care: Advanced Directive information    04/16/2022    9:19 AM  Advanced Directives  Does Patient Have a Medical Advance Directive? Yes  Type of Paramedic of [place];Living will;Out of facility DNR (pink MOST or yellow form)  Does patient want to make changes to medical advance directive? No - Patient declined  Copy of [place] in Chart? Yes - validated most recent copy scanned in chart (See row information)  Pre-existing out of facility DNR order (yellow form or pink MOST form) Pink MOST form placed in chart (order not valid for inpatient use)     Chief Complaint  Patient presents with  . Acute Visit    Irritability.     HPI:  Pt is a  88 y.o. male seen today for an acute visit for the patient's irritability, unable to redirect     Weight loss: CT abd/pelvis with CM, Underlying neoplasm or duodenal ulcer and reactive change is suspected. Increased Omeprazole, started Sucralfate, dc'd ASA for abd pain. Na 141, K 4.1, Bun 15, creat 0.79 04/21/22             Abnormal CT abd, unremarkable lipase, hepatic panel, CA 19-9. Pending f/u GI, abd MRI             HPOA Rilee Wendling: is leaning toward palliative care, but would like to know about the disease process MRI              Dementia/Disorientation: CT head with CM, No evidence of acute intracranial abnormality or mass. Progressive cerebral atrophy and chronic small vessel ischemic disease. Peripheral neuropathy, weakness in legs, w/c for mobility, on Gabapentin. Takes Vit B12. Had nerve conduction study 03/10/18             Anxiety/depression, on Sertraline, Mirtazapine, irritable, on prn Hydroxyzine since 04/22/22, dc'd Zyprexa 02/25/22. Will try adding Seroquel 12.'5mg'$  bid             Left knee OA, R shoulder,  X-ray R hip/knee, f/u Ortho, prn Tylenol.              Urinary frequency, stable, on Tamsulosin. underwent Urology evaluation in the past.              Hypothyroidism, takes Levothyroxine, TSH 0.98 02/06/22             Edema BLE, not apparent, off Furosemide.  Constipation, takes Senokot S, MiraLax             GERD, takes Omeprazole, Sucralfate, Hgb 12.8 03/15/22             Vit D deficiency, Vit D level 23, taking Vit D             Allergic rhinitis, takes Fluticasone nasal spray, Fexofenadine.              Vit B12 deficiency, takes Vit B12             Hyperlipidemia, LDL 66 05/31/21, takes Atorvastatin.     Past Medical History:  Diagnosis Date  . Abnormal MRI, knee    Per [facility] New Patient Packet   . Anemia   . Anticoagulated   . Anxiety   . Arthritis   . Cervical disc disorder    Per [facility] Patient Packet   . Complication of anesthesia    states "I was in  [place] several weeks after surgery"caused by tramadol  . Constipation   . Dysuria 07/28/2016  . Edema 07/24/2016  . Gait abnormality 07/25/2016  . Hard of hearing   . Head injury    As a result of a fall, Per [facility] New Patient Packet   . Hip arthritis 07/21/2016  . History of fall 07/25/2016  . Hypercholesteremia   . Hypertension   . Lumbar spinal stenosis   . Memory loss 07/28/2016   mild  . Obesity    Per [facility] New Patient Packet   . Osteoarthritis of right hip 07/24/2016  . Peripheral neuropathy 03/10/2018  . Sleep apnea    cpap  . Spinal stenosis of lumbar region 05/28/2016  . Urinary frequency   . Venous thrombosis of leg 07/24/2016   right gastrocnemius  . Vertigo   . Wears glasses   . Weight loss 04/08/2016   Past Surgical History:  Procedure Laterality Date  . BACK SURGERY  10/17, 80's  . COLONOSCOPY    . COLONOSCOPY     Per [facility] New Patient Packet, [place]   . ESOPHAGOGASTRODUODENOSCOPY    . LUMBAR LAMINECTOMY/DECOMPRESSION MICRODISCECTOMY Right 05/28/2016   Procedure: Right Lumbar Three-Four Microdiscectomy;  Surgeon: [name], MD;  Location: [place] [facility];  Service: Neurosurgery;  Laterality: Right;  . TOTAL HIP ARTHROPLASTY Right 07/21/2016   Procedure: TOTAL HIP ARTHROPLASTY ANTERIOR APPROACH;  Surgeon: [name], MD;  Location: [place];  Service: Orthopedics;  Laterality: Right;    Allergies  Allergen Reactions  . Tramadol Other (See Comments)    "does not eat" LOSS OF APPETITE    Allergies as of 04/28/2022       Reactions   Tramadol Other (See Comments)   "does not eat" LOSS OF APPETITE        Medication List        Accurate as of April 28, 2022 11:59 PM. If you have any questions, ask your nurse or doctor.          acetaminophen 500 MG tablet Commonly known as: TYLENOL Take 1,000 mg by mouth as needed.   aspirin EC 81 MG tablet Take 81 mg by mouth daily. 8am.   atorvastatin 20 MG tablet Commonly known as:  LIPITOR Take 20 mg by mouth daily. 8pm.   B-12 1000 MCG Tabs Take 1 tablet by mouth every other day. 8am.   Biofreeze 4 % Gel Generic drug: Menthol (Topical Analgesic) Apply topically in the morning, at noon, in the evening,  and at bedtime.   CoQ10 100 MG Caps Take 1 capsule by mouth daily. 8am.   DYNADERM HYDROCOLLOID 4"X4" EX Apply topically every 3 (three) days. Cleanse dime-sized area on right buttocks with NSS, pat dry, apply 2X2 dressing, change every 3 days.   fexofenadine 180 MG tablet Commonly known as: ALLEGRA Take 180 mg by mouth daily as needed for allergies or rhinitis.   fluticasone 50 MCG/ACT nasal spray Commonly known as: FLONASE Place 2 sprays into both nostrils as needed.   furosemide 20 MG tablet Commonly known as: LASIX Take 20 mg by mouth daily.   gabapentin 100 MG capsule Commonly known as: NEURONTIN Take 200 mg by mouth at bedtime. 8pm.   Glucosamine 500 MG Caps Take by mouth. Take 3 pills to equal '1500mg'$  daily.   hydrocortisone 2.5 % cream Apply topically 2 (two) times daily as needed.   lactose free nutrition Liqd Take 237 mLs by mouth daily.   Resource 2.0 Liqd Take 180 mLs by mouth in the morning and at bedtime.   levothyroxine 75 MCG tablet Commonly known as: SYNTHROID Take 75 mcg by mouth daily before breakfast.   meclizine 25 MG tablet Commonly known as: ANTIVERT Take 25 mg by mouth 3 (three) times daily as needed for dizziness.   mirtazapine 15 MG disintegrating tablet Commonly known as: REMERON SOL-TAB Take 0.5 tablets (7.5 mg total) by mouth at bedtime.   NAT-RUL PSYLLIUM SEED HUSKS PO Take 1 tablet by mouth daily. For constipation   nystatin powder Commonly known as: MYCOSTATIN/NYSTOP Apply 1 application  topically every other day. Apply to periwound and groin   omeprazole 20 MG capsule Commonly known as: PRILOSEC Take 20 mg by mouth daily.   potassium chloride SA 20 MEQ tablet Commonly known as: KLOR-CON M Take 20  mEq by mouth daily.   sennosides-docusate sodium 8.6-50 MG tablet Commonly known as: SENOKOT-S Take 2 tablets by mouth daily.   sertraline 25 MG tablet Commonly known as: ZOLOFT Take 1 tablet (25 mg total) by mouth daily.   tamsulosin 0.4 MG Caps capsule Commonly known as: FLOMAX Take 0.4 mg by mouth at bedtime.        Review of Systems  Constitutional:  Positive for unexpected weight change. Negative for fatigue and fever.       Weight loss  HENT:  Positive for hearing loss. Negative for congestion and voice change.   Eyes:  Negative for visual disturbance.  Respiratory:  Negative for cough and shortness of breath.   Cardiovascular:  Negative for leg swelling.  Gastrointestinal:  Negative for abdominal pain, constipation, nausea and vomiting.  Genitourinary:  Positive for frequency. Negative for difficulty urinating and urgency.  Musculoskeletal:  Positive for arthralgias, back pain and gait problem.       R+L knees, R shoulder, s/p cervical spine+lumber spien surgeries. Mostly pain is in the R shoulder. ]  Skin:  Negative for color change.  Neurological:  Positive for weakness and numbness. Negative for speech difficulty.       Memory lapses. Tingling, numbness, weakness in legs. Involuntary lip movements.   Psychiatric/Behavioral:  Positive for behavioral problems, confusion and dysphoric mood. Negative for sleep disturbance. The patient is nervous/anxious.        Anxious, talkative, irritable, unable to redirect.     Immunization History  Administered Date(s) Administered  . Influenza-Unspecified 06/11/2015, 06/11/2019, 06/19/2020, 06/27/2021  . Moderna Sars-Covid-2 Vaccination 09/10/2019, 10/08/2019, 07/17/2020, 02/05/2021  . PFIZER(Purple Top)SARS-COV-2 Vaccination 09/10/2019  . Pneumococcal Conjugate-13 09/12/2014  .  Pneumococcal Polysaccharide-23 04/21/2011  . Pneumococcal-Unspecified 04/21/2019  . Td 04/22/2002  . Tetanus 05/16/2020  . Unspecified SARS-COV-2  Vaccination 05/28/2021  . Zoster Recombinat (Shingrix) 09/14/2021, 11/25/2021  . Zoster, Live 04/27/2012   Pertinent  Health Maintenance Due  Topic Date Due  . INFLUENZA VACCINE  04/08/2022      07/28/2016    3:05 PM 02/08/2018    1:54 PM 03/24/2019    3:10 PM  Fall Risk  Falls in the past year? Yes Yes 0  Was there an injury with Fall? No No 0  Fall Risk Category Calculator   0  Fall Risk Category   Low  Patient Fall Risk Level   Low fall risk  Patient at Risk for Falls Due to History of fall(s);Impaired balance/gait;Impaired mobility    Fall risk Follow up Falls evaluation completed     Functional Status Survey:    Vitals:   04/28/22 1341  BP: 110/75  Pulse: 80  Resp: 18  Temp: 97.7 F (36.5 C)  SpO2: 98%   There is no height or weight on file to calculate BMI. Physical Exam Vitals and nursing note reviewed.  Constitutional:      Comments: frail  HENT:     Head: Normocephalic and atraumatic.     Mouth/Throat:     Mouth: Mucous membranes are dry.  Eyes:     Extraocular Movements: Extraocular movements intact.     Conjunctiva/sclera: Conjunctivae normal.     Pupils: Pupils are equal, round, and reactive to light.  Cardiovascular:     Rate and Rhythm: Normal rate and regular rhythm.     Heart sounds: No murmur heard. Pulmonary:     Effort: Pulmonary effort is normal.     Breath sounds: No rales.  Abdominal:     General: Bowel sounds are normal.     Palpations: Abdomen is soft.     Tenderness: There is no abdominal tenderness.  Musculoskeletal:        General: Tenderness present.     Cervical back: Normal range of motion and neck supple.     Right lower leg: No edema.     Left lower leg: No edema.     Comments: minimal edema BLE. R shoulder pain with ROM is chronic. Chronic R+L knee pain, knee support L knee. S/p R hips replacement. S/p cervical/lumbar spine surgeries.   Skin:    General: Skin is warm and dry.  Neurological:     General: No focal deficit  present.     Mental Status: He is alert. Mental status is at baseline.     Motor: Weakness present.     Gait: Gait abnormal.     Comments: Moves slow, uses w/c for mobility,  lower body weakness, peripheral neuropathy in legs.   Psychiatric:     Comments: Anxious, talkative, irritable, unable to redirect.     Labs reviewed: Recent Labs    02/24/22 0000 03/04/22 0000 03/15/22 0000  NA 141 136* 137  K 3.8 4.4 4.2  CL 109* 97* 101  CO2 27* 30* 27*  BUN '17 13 14  '$ CREATININE 0.7 0.8 0.7  CALCIUM 7.6* 8.3* 8.4*   No results for input(s): "AST", "ALT", "ALKPHOS", "BILITOT", "PROT", "ALBUMIN" in the last 8760 hours. Recent Labs    02/24/22 0000 03/04/22 0000 03/15/22 0000  WBC 8.5 10.4 6.7  NEUTROABS 7,378.00 8,393.00 5,038.00  HGB 12.1* 12.9* 12.8*  HCT 37* 39* 41  PLT 81* 401* 280   Lab Results  Component Value Date   TSH 0.98 02/06/2022   No results found for: "HGBA1C" Lab Results  Component Value Date   CHOL 114 05/31/2021   HDL 25 (A) 05/31/2021   LDLCALC 66 05/31/2021   TRIG 149 05/31/2021   CHOLHDL 5.1 (H) 03/31/2019    Significant Diagnostic Results in last 30 days:  CT Abdomen Pelvis W Contrast  Result Date: 04/11/2022 CLINICAL DATA:  Unintended weight loss, disorientation EXAM: CT ABDOMEN AND PELVIS WITH CONTRAST TECHNIQUE: Multidetector CT imaging of the abdomen and pelvis was performed using the standard protocol following bolus administration of intravenous contrast. RADIATION DOSE REDUCTION: This exam was performed according to the departmental dose-optimization program which includes automated exposure control, adjustment of the mA and/or kV according to patient size and/or use of iterative reconstruction technique. CONTRAST:  177m OMNIPAQUE IOHEXOL 300 MG/ML  SOLN COMPARISON:  None Available. FINDINGS: Lower chest: Minimal atelectasis or scarring within the right lower lobe. Trace right pleural effusion. Calcified left lower lobe granuloma. Moderate hiatal  hernia. Hepatobiliary: The liver is unremarkable without focal abnormality. Gallbladder is moderately distended, with multiple noncalcified gallstones identified. There is mild gallbladder wall thickening and pericholecystic fat stranding, with loss of normal fat plane between the gallbladder neck, proximal duodenum, and gastric antrum. Differential diagnosis would include acute/chronic cholecystitis versus underlying neoplasm. Correlation with upper endoscopy/EUS and/or MRI may be useful. Pancreas: Unremarkable. No pancreatic ductal dilatation or surrounding inflammatory changes. Spleen: Normal in size without focal abnormality. Adrenals/Urinary Tract: No urinary tract calculi or obstructive uropathy. No worrisome solid renal lesions. The adrenals and bladder are unremarkable. Stomach/Bowel: As discussed above, there is abnormal wall thickening involving the gastric antrum and proximal duodenum, obscuring the fat planes between the structures and the gallbladder neck. There is a possible mass involving the medial margin of the first portion duodenum, measuring 2.7 x 2.7 x 1.6 cm, reference axial image 35/3 and coronal image 43/6. There may be a small central ulceration seen on the coronal reconstruction. Again, correlation with upper endoscopy/EUS may be useful. There is no bowel obstruction or ileus. Normal appendix right lower quadrant. Moderate retained stool throughout the colon, most pronounced within the rectal vault, consistent with constipation. Vascular/Lymphatic: Aortic atherosclerosis. No enlarged abdominal or pelvic lymph nodes. Reproductive: Prostate is unremarkable. Other: No free fluid or free intraperitoneal gas. No abdominal wall hernia. Musculoskeletal: Anterior wedge compression deformity of L1 with greater than 75% loss of height and 7 mm of retropulsion appears chronic. No visible fracture line, and there is no paraspinal soft tissue swelling. No other acute bony abnormalities are identified.  Unremarkable right hip arthroplasty. Reconstructed images demonstrate no additional findings. IMPRESSION: 1. Abnormal wall thickening along the medial aspect of the proximal duodenum, with loss of normal fat plane between the duodenum, gastric antrum, and gallbladder neck as above. Underlying neoplasm or duodenal ulcer and reactive change is suspected. Correlation with upper endoscopy may be useful. 2. Multiple noncalcified gallstones, with mild gallbladder wall thickening and perich[place]cystic fat stranding as above. Findings could reflect secondary inflammatory changes related to the duodenal process described above, no acute or chronic cholecystitis could give a similar appearance. Please correlate with clinical presentation. Additional evaluation with MRI or nuclear medicine hepatobiliary scan could be performed for further characterization. 3. Moderate hiatal hernia. 4. Age indeterminate L1 compression fracture, likely chronic as stated above. 7 mm retropulsion results in central canal narrowing. 5. Significant colonic fecal retention consistent with constipation. 6.  Aortic Atherosclerosis (ICD10-I70.0). These results will be called to the ordering  clinician or representative by the Radiologist Assistant, and communication documented in the PACS or [employer]. Electronically Signed   By: Randa Ngo M.D.   On: 04/11/2022 17:59   CT HEAD W & WO CONTRAST (5MM)  Result Date: 04/11/2022 CLINICAL DATA:  Mental status change, unknown cause. Unintentional weight loss. Disorientation. EXAM: CT HEAD WITHOUT AND WITH CONTRAST TECHNIQUE: Contiguous axial images were obtained from the base of the skull through the vertex without and with intravenous contrast. RADIATION DOSE REDUCTION: This exam was performed according to the departmental dose-optimization program which includes automated exposure control, adjustment of the mA and/or kV according to patient size and/or use of iterative reconstruction technique.  CONTRAST:  162m OMNIPAQUE IOHEXOL 300 MG/ML  SOLN COMPARISON:  Noncontrast head CT 05/25/2017 FINDINGS: Brain: There is no evidence of an acute infarct, intracranial hemorrhage, mass, midline shift, or extra-axial fluid collection. Moderate cerebral atrophy has progressed from the prior study including somewhat prominent volume loss in the mesial temporal lobes. Hypodensities in the cerebral white matter bilaterally have progressed and are nonspecific but compatible with mild-to-moderate chronic small vessel ischemic disease. No abnormal enhancement is identified. Vascular: Calcified atherosclerosis at the skull base. Gross patency of the dural venous sinuses and large intracranial arteries. Skull: No fracture or suspicious osseous lesion. Sinuses/Orbits: Visualized paranasal sinuses and mastoid air cells are clear. Unremarkable orbits. Other: None. IMPRESSION: 1. No evidence of acute intracranial abnormality or mass. 2. Progressive cerebral atrophy and chronic small vessel ischemic disease. Electronically Signed   By: ALogan BoresM.D.   On: 04/11/2022 14:51    Assessment/Plan: Memory loss CT head with CM, No evidence of acute intracranial abnormality or mass. Progressive cerebral atrophy and chronic small vessel ischemic disease.  Anxiety Anxious, irritable, on Sertraline, Mirtazapine, prn Hydroxyzine since 04/22/22, dc'd Zyprexa 02/25/22. Will try adding Seroquel 12.'5mg'$  bid, observe.   Weight loss CT abd/pelvis with CM, Underlying neoplasm or duodenal ulcer and reactive change is suspected. Increased Omeprazole, started Sucralfate, dc'd ASA for abd pain  Na 141, K 4.1, Bun 15, creat 0.79 04/21/22  Abnormal abdominal CT scan Abnormal CT abd, unremarkable lipase, hepatic panel, CA 19-9. Pending f/u GI, abd MRI 05/01/22 GI cancel MRI abd.   Axonal sensorimotor neuropathy  weakness in legs, w/c for mobility, on Gabapentin. Takes Vit B12. Had nerve conduction study 03/10/18  Osteoarthritis, multiple  sites  Left knee OA, R shoulder,  X-ray R hip/knee, f/u Ortho, prn Tylenol.   Urinary frequency  stable, on Tamsulosin. underwent Urology evaluation in the past.  Hypothyroidism  takes Levothyroxine, TSH 0.98 02/06/22  Edema of both lower legs due to peripheral venous insufficiency not apparent, off Furosemide.   Constipation  takes Senokot S, MiraLax  GERD (gastroesophageal reflux disease) takes Omeprazole, Sucralfate, Hgb 12.8 03/15/22  Vitamin D deficiency Vit D level 23, taking Vit D  Allergic rhinitis takes Fluticasone nasal spray, Fexofenadine.   Vitamin B12 deficiency takes Vit B12  Hypercholesteremia LDL 66 05/31/21, takes Atorvastatin.     Family/ staff Communication: plan of care reviewed with the patient and charge nurse.   Labs/tests ordered:  none  Time spend 35 minutes.

## 2022-04-28 NOTE — Assessment & Plan Note (Signed)
stable, on Tamsulosin. underwent Urology evaluation in the past. 

## 2022-04-30 NOTE — Telephone Encounter (Signed)
Patient had been scheduled for 8/24 with Dr. Loletha Carrow.    Thank you

## 2022-05-01 ENCOUNTER — Ambulatory Visit: Payer: Medicare PPO | Admitting: Gastroenterology

## 2022-05-01 ENCOUNTER — Encounter: Payer: Self-pay | Admitting: Gastroenterology

## 2022-05-01 VITALS — BP 94/62 | HR 92 | Ht 71.0 in

## 2022-05-01 DIAGNOSIS — R1013 Epigastric pain: Secondary | ICD-10-CM

## 2022-05-01 DIAGNOSIS — R634 Abnormal weight loss: Secondary | ICD-10-CM | POA: Diagnosis not present

## 2022-05-01 DIAGNOSIS — R933 Abnormal findings on diagnostic imaging of other parts of digestive tract: Secondary | ICD-10-CM | POA: Diagnosis not present

## 2022-05-01 NOTE — Patient Instructions (Signed)
_______________________________________________________  If you are age 86 or older, your body mass index should be between 23-30. Your Body mass index is 22.87 kg/m. If this is out of the aforementioned range listed, please consider follow up with your Primary Care Provider.  If you are age 16 or younger, your body mass index should be between 19-25. Your Body mass index is 22.87 kg/m. If this is out of the aformentioned range listed, please consider follow up with your Primary Care Provider.   ________________________________________________________  The [facility] providers would like to encourage you to use [facility] to communicate with providers for non-urgent requests or questions.  Due to long hold times on the telephone, sending your provider a message by [facility] may be a faster and more efficient way to get a response.  Please allow 48 business hours for a response.  Please remember that this is for non-urgent requests.  _______________________________________________________  Please follow up as needed

## 2022-05-01 NOTE — Progress Notes (Signed)
[facility] Consult Note:  History: [name] 05/01/2022  Referring provider: Mast, [name] X, NP  Reason for consult/chief complaint: Weight Loss (No diarrhea, vomiting once a month ago, weight loss has been going on for months, mostly liquids - tries to drink boost) and Abdominal Pain (Epigastric pain - describes as a spasms for many years )   Subjective  HPI: [name] with dementia and associated mental status changes as well as a multiplicity of other medical issues referred by his nursing home provider for weight loss and concerns of an abnormality on recent CT abdomen pelvis. That provider's note from earlier this week indicates that patient's power of attorney is hoping to learn more about the nature of his GI diagnosis while leaning toward palliative care consideration.  [name] was here today at the request of his primary care provider and family for evaluation of his decreased appetite and weight loss as well as findings on the CT scan.  His son [name] and daughter [name] were in attendance, and they accompanied him from the nursing facility.  [name] is alert and pleasant, though confused and unable to give much in the way of history or review of systems.  It is difficult to tell how much abdominal pain he may be having.  His family indicates his food intake is definitely decreased in recent months and he sometimes complains of his stomach.  There have been a few episodes of vomiting without reported hematemesis.   ROS:  Review of Systems No additional review of systems is feasible given his dementia  Past Medical History: Past Medical History:  Diagnosis Date   Abnormal MRI, knee    Per [place] New Patient Packet    Anemia    Anticoagulated    Anxiety    Arthritis    Cervical disc disorder    Per [facility] New Patient Packet    Complication of anesthesia    states "I was in [place] several weeks after surgery"caused by tramadol    Constipation    Dysuria 07/28/2016   Edema 07/24/2016   Gait abnormality 07/25/2016   Hard of hearing    Head injury    As a result of a fall, Per [facility] New Patient Packet    Hip arthritis 07/21/2016   History of fall 07/25/2016   Hypercholesteremia    Hypertension    Lumbar spinal stenosis    Memory loss 07/28/2016   mild   Obesity    Per [facility] New Patient Packet    Osteoarthritis of right hip 07/24/2016   Peripheral neuropathy 03/10/2018   Sleep apnea    cpap   Spinal stenosis of lumbar region 05/28/2016   Urinary frequency    Venous thrombosis of leg 07/24/2016   right gastrocnemius   Vertigo    Wears glasses    Weight loss 04/08/2016     Past Surgical History: Past Surgical History:  Procedure Laterality Date   BACK SURGERY  10/17, 80's   COLONOSCOPY     COLONOSCOPY     Per [place] New Patient Packet, Dr.Schooler    ESOPHAGOGASTRODUODENOSCOPY     LUMBAR LAMINECTOMY/DECOMPRESSION MICRODISCECTOMY Right 05/28/2016   Procedure: Right Lumbar Three-Four Microdiscectomy;  Surgeon: [name], MD;  Location: [facility];  Service: Neurosurgery;  Laterality: Right;   TOTAL HIP ARTHROPLASTY Right 07/21/2016   Procedure: TOTAL HIP ARTHROPLASTY ANTERIOR APPROACH;  Surgeon: [name], MD;  Location: [place];  Service: Orthopedics;  Laterality: Right;  Family History: Family History  Problem Relation Age of Onset   Heart disease Mother    Transient ischemic attack Mother    Kidney failure Father    Alzheimer's disease Sister    AAA (abdominal aortic aneurysm) Sister    Lung cancer Brother    Memory loss Sister    Memory loss Sister    Heart disease Sister    Cancer Sister    Diabetes Sister    Cancer Sister     Social History: Social History   Socioeconomic History   Marital status: Married    Spouse name: Not on file   Number of children: 2   Years of education: MS   Highest education level: Not on file  Occupational History   Not on file  Tobacco Use    Smoking status: Former    Packs/day: 1.00    Years: 4.00    Total pack years: 4.00    Types: Cigarettes    Quit date: 07/16/1968    Years since quitting: 53.8   Smokeless tobacco: Never   Tobacco comments:    quit 50 years ago  Vaping Use   Vaping Use: Never used  Substance and Sexual Activity   Alcohol use: No   Drug use: No   Sexual activity: Not Currently    Partners: Female  Other Topics Concern   Not on file  Social History Narrative   Lives w/ wife   Caffeine use: Coffee daily   Right handed    Retired. Prior rehab counselor. Resident at [facility] since about 2001.      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 Yes   Do you have difficulty preparing food or eating? Yes   Do you have difficulty managing your medications? Yes   Do you have difficulty managing your finances? Yes   Do you have difficulty affording your medications? No   Social Determinants of Radio broadcast assistant Strain: Not on file  Food Insecurity: Not on file  Transportation Needs: Not on file  Physical Activity: Not on file  Stress: Not on file  Social Connections: Not on file    Allergies: Allergies  Allergen Reactions   Tramadol Other (See Comments)    "does not eat" LOSS OF APPETITE    Outpatient Meds: Current Outpatient Medications  Medication Sig Dispense Refill   acetaminophen (TYLENOL) 500 MG tablet Take 1,000 mg by mouth as needed.     aspirin EC 81 MG tablet Take 81 mg by mouth daily. 8am.      atorvastatin (LIPITOR) 20 MG tablet Take 20 mg by mouth daily. 8pm.     Coenzyme Q10 (COQ10) 100 MG CAPS Take 1 capsule by mouth daily. 8am.     Cyanocobalamin (B-12) 1000 MCG TABS Take 1 tablet by mouth every other day.  8am.     fexofenadine (ALLEGRA) 180 MG tablet Take 180 mg by mouth daily as needed for allergies or rhinitis.     fluticasone (FLONASE) 50 MCG/ACT nasal spray Place 2 sprays into both nostrils as needed.      furosemide (LASIX) 20 MG tablet Take 20 mg by mouth daily.     gabapentin (NEURONTIN) 100 MG capsule Take 200 mg by mouth at bedtime. 8pm.     Glucosamine 500 MG CAPS Take by mouth. Take 3 pills to equal '1500mg'$  daily.     Hydroactive Dressings (DYNADERM HYDROCOLLOID 4"X4" EX) Apply topically every 3 (three) days. Cleanse dime-sized area on right buttocks with NSS, pat dry, apply 2X2 dressing, change every 3 days.     hydrocortisone 2.5 % cream Apply topically 2 (two) times daily as needed.     lactose free nutrition (BOOST) LIQD Take 237 mLs by mouth daily.     levothyroxine (SYNTHROID) 75 MCG tablet Take 75 mcg by mouth daily before breakfast.     meclizine (ANTIVERT) 25 MG tablet Take 25 mg by mouth 3 (three) times daily as needed for dizziness.     Menthol, Topical Analgesic, (BIOFREEZE) 4 % GEL Apply topically in the morning, at noon, in the evening, and at bedtime.     mirtazapine (REMERON SOL-TAB) 15 MG disintegrating tablet Take 0.5 tablets (7.5 mg total) by mouth at bedtime. 15 tablet 3   NAT-RUL PSYLLIUM SEED HUSKS PO Take 1 tablet by mouth daily. For constipation     Nutritional Supplements (RESOURCE 2.0) LIQD Take 180 mLs by mouth in the morning and at bedtime.     nystatin (MYCOSTATIN/NYSTOP) powder Apply 1 application  topically every other day. Apply to periwound and groin     omeprazole (PRILOSEC) 20 MG capsule Take 20 mg by mouth daily.     potassium chloride SA (KLOR-CON M) 20 MEQ tablet Take 20 mEq by mouth daily.     sennosides-docusate sodium (SENOKOT-S)  8.6-50 MG tablet Take 2 tablets by mouth daily.     sertraline (ZOLOFT) 25 MG tablet Take 1 tablet (25 mg total) by mouth daily. 30 tablet 3   tamsulosin (FLOMAX) 0.4 MG CAPS capsule Take 0.4 mg by mouth at bedtime.     No current facility-administered medications for this visit.      ___________________________________________________________________ Objective   Exam:  BP 94/62 (BP Location: Right Arm, Patient Position: Sitting)   Pulse 92   Ht '5\' 11"'$  (1.803 m)   SpO2 (!) 84%   BMI 22.87 kg/m  Wt Readings from Last 3 Encounters:  04/16/22 164 lb (74.4 kg)  04/14/22 164 lb (74.4 kg)  04/14/22 164 lb (74.4 kg)    General: Frail elderly [name] sitting in a wheelchair, somewhat restless. Eyes: sclera anicteric, no redness ENT: oral mucosa dry with a white film on his tongue, no cervical or supraclavicular lymphadenopathy CV: Heart sounds somewhat distant, regular, no appreciable murmur,no peripheral edema Resp: clear to auscultation bilaterally, normal RR and effort noted GI: soft, no tenderness, with active bowel sounds.  No guarding or rebound.  Limited exam as he is confined to a wheelchair and cannot get out of it to an exam table. Skin; warm and dry, no rash or jaundice noted Neuro: awake, alert and oriented x 3. Normal gross motor function and fluent speech  Labs:     Latest Ref Rng & Units 03/15/2022   12:00 AM 03/04/2022   12:00 AM 02/24/2022   12:00 AM  CBC  WBC  6.7     10.4     8.5      Hemoglobin 13.5 - 17.5 12.8     12.9     12.1      Hematocrit 41 - 53 41     39     37      Platelets 150 - 400 K/uL 280     401     81         This result is from an external source.      Latest Ref Rng & Units 03/15/2022   12:00 AM 03/04/2022   12:00 AM 02/24/2022   12:00 AM  CMP  BUN 4 - '21 14     13     17      '$ Creatinine 0.6 - 1.3 0.7     0.8     0.7      Sodium 137 - 147 137     136     141      Potassium 3.5 - 5.1 mEq/L 4.2     4.4     3.8      Chloride 99 - 108 101      97     109      CO2 13 - '22 27     30     27      '$ Calcium 8.7 - 10.7 8.4     8.3     7.6         This result is from an external source.     Radiologic Studies:  CLINICAL DATA:  Unintended weight loss, disorientation   EXAM: CT ABDOMEN AND PELVIS WITH CONTRAST   TECHNIQUE: Multidetector CT imaging of the abdomen and pelvis was performed using the standard protocol following bolus administration of intravenous contrast.   RADIATION DOSE REDUCTION: This exam was performed according to the departmental dose-optimization program which includes automated exposure control, adjustment of the mA and/or kV according to patient size and/or use of iterative reconstruction technique.   CONTRAST:  174m OMNIPAQUE IOHEXOL 300 MG/ML  SOLN   COMPARISON:  None Available.   FINDINGS: Lower chest: Minimal atelectasis or scarring within the right lower lobe. Trace right pleural effusion. Calcified left lower lobe granuloma. Moderate hiatal hernia.   Hepatobiliary: The liver is unremarkable without focal abnormality. Gallbladder is moderately distended, with multiple noncalcified gallstones identified. There is mild gallbladder wall thickening and pericholecystic fat stranding, with loss of normal fat plane between the gallbladder neck, proximal duodenum, and gastric antrum. Differential diagnosis would include acute/chronic cholecystitis versus underlying neoplasm. Correlation with upper endoscopy/EUS and/or MRI may be useful.   Pancreas: Unremarkable. No pancreatic ductal dilatation or surrounding inflammatory changes.   Spleen: Normal in size without focal abnormality.   Adrenals/Urinary Tract: No urinary tract calculi or obstructive uropathy. No worrisome solid renal lesions. The adrenals and bladder are unremarkable.   Stomach/Bowel: As discussed above, there is abnormal wall thickening involving the gastric antrum and proximal duodenum, obscuring the fat planes between the  structures and the gallbladder neck. There is a possible mass involving the medial margin of the first portion duodenum, measuring 2.7 x 2.7 x 1.6 cm, reference axial image 35/3 and coronal image 43/6. There may be a small central ulceration seen on the coronal reconstruction. Again, correlation with upper endoscopy/EUS may be useful.   There is no bowel obstruction or ileus. Normal appendix right lower quadrant. Moderate retained stool throughout the colon, most pronounced  within the rectal vault, consistent with constipation.   Vascular/Lymphatic: Aortic atherosclerosis. No enlarged abdominal or pelvic lymph nodes.   Reproductive: Prostate is unremarkable.   Other: No free fluid or free intraperitoneal gas. No abdominal wall hernia.   Musculoskeletal: Anterior wedge compression deformity of L1 with greater than 75% loss of height and 7 mm of retropulsion appears chronic. No visible fracture line, and there is no paraspinal soft tissue swelling. No other acute bony abnormalities are identified. Unremarkable right hip arthroplasty. Reconstructed images demonstrate no additional findings.   IMPRESSION: 1. Abnormal wall thickening along the medial aspect of the proximal duodenum, with loss of normal fat plane between the duodenum, gastric antrum, and gallbladder neck as above. Underlying neoplasm or duodenal ulcer and reactive change is suspected. Correlation with upper endoscopy may be useful. 2. Multiple noncalcified gallstones, with mild gallbladder wall thickening and pericholecystic fat stranding as above. Findings could reflect secondary inflammatory changes related to the duodenal process described above, no acute or chronic cholecystitis could give a similar appearance. Please correlate with clinical presentation. Additional evaluation with MRI or nuclear medicine hepatobiliary scan could be performed for further characterization. 3. Moderate hiatal hernia. 4. Age  indeterminate L1 compression fracture, likely chronic as stated above. 7 mm retropulsion results in central canal narrowing. 5. Significant colonic fecal retention consistent with constipation. 6.  Aortic Atherosclerosis (ICD10-I70.0).   These results will be called to the ordering clinician or representative by the Radiologist Assistant, and communication documented in the PACS or [employer].     Electronically Signed   By: Randa Ngo M.D.   On: 04/11/2022 17:59  (CT images personally reviewed - H. Danis)  Assessment: Encounter Diagnoses  Name Primary?   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  There is the decidedly less likely possibility of a severe duodenal ulcer, which is presumably why he is on once daily acid suppression.  His symptoms have been escalating and recent months causing reported significant weight loss such that he is noted to be 41 kg based on tag on his wheelchair placed there by his caregivers. He is in poor condition for an upper endoscopy, clearly at high risk for respiratory and other complications.  Given the high index of suspicion for malignancy, and his advanced dementia, debility and DNR status, I recommend not pursuing further testing.  His family has already had some discussions and consideration of palliative care, and I have encouraged them to continue in that direction. An MRI of the abdomen is apparently scheduled for tomorrow, but I recommend that be discontinued.  It would be a poor quality exam and very unlikely to change our management.  If this does happen to be a severe peptic ulcer, perhaps it will improve slowly with time and acid suppression.  On that note, his omeprazole could be increased to maximal dosing of 40 mg twice daily for 6 to 8 weeks.  I will leave that to the discretion of his primary  providers.  However, if it is the malignancy I suspect, then comfort care in the near future seems most appropriate. His son was under the impression that perhaps a repeat CT scan should be done in about 6 weeks, but I do not feel that is necessary for the reasons noted above.  His family was comfortable with that decision, and I  appreciate the opportunity to consult on him.  Thank you for the courtesy of this consult.  Please call me with any questions or concerns.  Nelida Meuse III  CC: Referring provider noted above

## 2022-05-02 ENCOUNTER — Ambulatory Visit (HOSPITAL_COMMUNITY): Admission: RE | Admit: 2022-05-02 | Payer: Medicare PPO | Source: Ambulatory Visit

## 2022-05-07 ENCOUNTER — Encounter: Payer: Self-pay | Admitting: Adult Health

## 2022-05-07 ENCOUNTER — Non-Acute Institutional Stay (SKILLED_NURSING_FACILITY): Payer: Medicare PPO | Admitting: Adult Health

## 2022-05-07 DIAGNOSIS — R935 Abnormal findings on diagnostic imaging of other abdominal regions, including retroperitoneum: Secondary | ICD-10-CM | POA: Diagnosis not present

## 2022-05-07 DIAGNOSIS — K219 Gastro-esophageal reflux disease without esophagitis: Secondary | ICD-10-CM | POA: Diagnosis not present

## 2022-05-07 DIAGNOSIS — R1110 Vomiting, unspecified: Secondary | ICD-10-CM

## 2022-05-07 DIAGNOSIS — R630 Anorexia: Secondary | ICD-10-CM | POA: Diagnosis not present

## 2022-05-07 NOTE — Progress Notes (Unsigned)
Location:  [facility] Room Number: NO/16/A Place of Service:  SNF (31) Provider:  Durenda Age, DNP, FNP-BC  Patient Care Team: Mast, Man X, NP as PCP - General (Internal Medicine) [name], [name], MD as Consulting Physician (Orthopedic Surgery) [name], MD as Consulting Physician (Gastroenterology) System, Provider Not In [name], MD as Consulting Physician (Neurosurgery) [name], MD as Referring Physician (Ophthalmology) Mast, Man X, NP as Nurse Practitioner (Internal Medicine) Almedia Balls, MD as Referring Physician (Orthopedic Surgery) [name], MD as Consulting Physician (Neurosurgery) Elsie Saas, MD as Consulting Physician (Orthopedic Surgery) Allyn Kenner, MD (Dermatology) Kathrynn Ducking, MD (Inactive) as Consulting Physician (Neurology) Franchot Gallo, MD as Consulting Physician (Urology)  Extended Emergency Contact Information Primary Emergency Contact: [name] Address: [address]          [place]          [address], [place] [address] [place] Phone: [phone] Relation: Spouse Secondary Emergency Contact: Kyle Mobile Phone: [phone] Relation: Relative  Code Status:  DNR  Goals of care: Advanced Directive information    04/16/2022    9:19 AM  Advanced Directives  Does Patient Have a Medical Advance Directive? Yes  Type of Paramedic of [place];Living will;Out of facility DNR (pink MOST or yellow form)  Does patient want to make changes to medical advance directive? No - Patient declined  Copy of [place] in Chart? Yes - validated most recent copy scanned in chart (See row information)  Pre-existing out of facility DNR order (yellow form or pink MOST form) Pink MOST form placed in chart (order not valid for inpatient use)     Chief Complaint  Patient presents with   Acute Visit    Patient is  being seen for vomitting    HPI:  Pt is a 88 y.o. male seen today for medical management of chronic diseases.  ***   Past Medical History:  Diagnosis Date   Abnormal MRI, knee    Per [facility] New Patient Packet    Anemia    Anticoagulated    Anxiety    Arthritis    Cervical disc disorder    Per [facility] New Patient Packet    Complication of anesthesia    states "[place] was [place] weeks after surgery"caused by tramadol   Constipation    Dysuria 07/28/2016   Edema 07/24/2016   Gait abnormality 07/25/2016   Hard of hearing    Head injury    As a result of a fall, Per [facility] New Patient Packet    Hip arthritis 07/21/2016   History of fall 07/25/2016   Hypercholesteremia    Hypertension    Lumbar spinal stenosis    Memory loss 07/28/2016   mild   Obesity    Per [facility] New Patient Packet    Osteoarthritis of right hip 07/24/2016   Peripheral neuropathy 03/10/2018   Sleep apnea    cpap   Spinal stenosis of lumbar region 05/28/2016   Urinary frequency    Venous thrombosis of leg 07/24/2016   right gastrocnemius   Vertigo    Wears glasses    Weight loss 04/08/2016   Past Surgical History:  Procedure Laterality Date   BACK SURGERY  10/17, 80's   COLONOSCOPY     COLONOSCOPY     Per [facility] New Patient Packet, Dr.Schooler    ESOPHAGOGASTRODUODENOSCOPY     LUMBAR LAMINECTOMY/DECOMPRESSION MICRODISCECTOMY Right  05/28/2016   Procedure: Right Lumbar Three-Four Microdiscectomy;  Surgeon: [name], MD;  Location: [place] NEURO ORS;  Service: Neurosurgery;  Laterality: Right;   TOTAL HIP ARTHROPLASTY Right 07/21/2016   Procedure: TOTAL HIP ARTHROPLASTY ANTERIOR APPROACH;  Surgeon: [name], MD;  Location: [facility];  Service: Orthopedics;  Laterality: Right;    Allergies  Allergen Reactions   Tramadol Other (See Comments)    "does not eat" LOSS OF APPETITE    Outpatient Encounter Medications as of 05/07/2022  Medication Sig   acetaminophen (TYLENOL) 500 MG tablet Take 1,000  mg by mouth as needed.   aspirin EC 81 MG tablet Take 81 mg by mouth daily. 8am.   atorvastatin (LIPITOR) 20 MG tablet Take 20 mg by mouth daily. 8pm.   Coenzyme Q10 (COQ10) 100 MG CAPS Take 1 capsule by mouth daily. 8am.   Cyanocobalamin (B-12) 1000 MCG TABS Take 1 tablet by mouth every other day. 8am.   fexofenadine (ALLEGRA) 180 MG tablet Take 180 mg by mouth daily as needed for allergies or rhinitis.   fluticasone (FLONASE) 50 MCG/ACT nasal spray Place 2 sprays into both nostrils as needed.    furosemide (LASIX) 20 MG tablet Take 20 mg by mouth daily.   gabapentin (NEURONTIN) 100 MG capsule Take 200 mg by mouth at bedtime. 8pm.   Glucosamine 500 MG CAPS Take by mouth. Take 3 pills to equal '1500mg'$  daily.   Hydroactive Dressings (DYNADERM HYDROCOLLOID 4"X4" EX) Apply topically every 3 (three) days. Cleanse dime-sized area on right buttocks with NSS, pat dry, apply 2X2 dressing, change every 3 days.   hydrocortisone 2.5 % cream Apply topically 2 (two) times daily as needed.   lactose free nutrition (BOOST) LIQD Take 237 mLs by mouth daily.   levothyroxine (SYNTHROID) 75 MCG tablet Take 75 mcg by mouth daily before breakfast.   meclizine (ANTIVERT) 25 MG tablet Take 25 mg by mouth 3 (three) times daily as needed for dizziness.   Menthol, Topical Analgesic, (BIOFREEZE) 4 % GEL Apply topically in the morning, at noon, in the evening, and at bedtime.   mirtazapine (REMERON SOL-TAB) 15 MG disintegrating tablet Take 0.5 tablets (7.5 mg total) by mouth at bedtime.   NAT-RUL PSYLLIUM SEED HUSKS PO Take 1 tablet by mouth daily. For constipation   Nutritional Supplements (RESOURCE 2.0) LIQD Take 180 mLs by mouth in the morning and at bedtime.   nystatin (MYCOSTATIN/NYSTOP) powder Apply 1 application  topically every other day. Apply to periwound and groin   omeprazole (PRILOSEC) 20 MG capsule Take 20 mg by mouth daily.   potassium chloride SA (KLOR-CON M) 20 MEQ tablet Take 20 mEq by mouth daily.    sennosides-docusate sodium (SENOKOT-S) 8.6-50 MG tablet Take 2 tablets by mouth daily.   sertraline (ZOLOFT) 25 MG tablet Take 1 tablet (25 mg total) by mouth daily.   tamsulosin (FLOMAX) 0.4 MG CAPS capsule Take 0.4 mg by mouth at bedtime.   No facility-administered encounter medications on file as of 05/07/2022.    Review of Systems ***    Immunization History  Administered Date(s) Administered   Influenza-Unspecified 06/11/2015, 06/11/2019, 06/19/2020, 06/27/2021   Moderna Sars-Covid-2 Vaccination 09/10/2019, 10/08/2019, 07/17/2020, 02/05/2021   PFIZER(Purple Top)SARS-COV-2 Vaccination 09/10/2019   Pneumococcal Conjugate-13 09/12/2014   Pneumococcal Polysaccharide-23 04/21/2011   Pneumococcal-Unspecified 04/21/2019   Td 04/22/2002   Tetanus 05/16/2020   Unspecified SARS-COV-2 Vaccination 05/28/2021   Zoster Recombinat (Shingrix) 09/14/2021, 11/25/2021   Zoster, Live 04/27/2012   Pertinent  Health Maintenance Due  Topic Date Due   INFLUENZA VACCINE  04/08/2022      07/28/2016    3:05 PM 02/08/2018    1:54 PM 03/24/2019    3:10 PM  Fall Risk  Falls in the past year? Yes Yes 0  Was there an injury with Fall? No No 0  Fall Risk Category Calculator   0  Fall Risk Category   Low  Patient Fall Risk Level   Low fall risk  Patient at Risk for Falls Due to History of fall(s);Impaired balance/gait;Impaired mobility    Fall risk Follow up Falls evaluation completed       Vitals:   05/07/22 1446  BP: (!) 129/59  Pulse: 80  Resp: 20  Temp: (!) 97 F (36.1 C)  SpO2: 93%  Weight: 161 lb (73 kg)  Height: '5\' 9"'$  (1.753 m)   Body mass index is 23.78 kg/m.  Physical Exam     Labs reviewed: Recent Labs    02/24/22 0000 03/04/22 0000 03/15/22 0000  NA 141 136* 137  K 3.8 4.4 4.2  CL 109* 97* 101  CO2 27* 30* 27*  BUN '17 13 14  '$ CREATININE 0.7 0.8 0.7  CALCIUM 7.6* 8.3* 8.4*   No results for input(s): "AST", "ALT", "ALKPHOS", "BILITOT", "PROT", "ALBUMIN" in the last  8760 hours. Recent Labs    02/24/22 0000 03/04/22 0000 03/15/22 0000  WBC 8.5 10.4 6.7  NEUTROABS 7,378.00 8,393.00 5,038.00  HGB 12.1* 12.9* 12.8*  HCT 37* 39* 41  PLT 81* 401* 280   Lab Results  Component Value Date   TSH 0.98 02/06/2022   No results found for: "HGBA1C" Lab Results  Component Value Date   CHOL 114 05/31/2021   HDL 25 (A) 05/31/2021   LDLCALC 66 05/31/2021   TRIG 149 05/31/2021   CHOLHDL 5.1 (H) 03/31/2019    Significant Diagnostic Results in last 30 days:  CT Abdomen Pelvis W Contrast  Result Date: 04/11/2022 CLINICAL DATA:  Unintended weight loss, disorientation EXAM: CT ABDOMEN AND PELVIS WITH CONTRAST TECHNIQUE: Multidetector CT imaging of the abdomen and pelvis was performed using the standard protocol following bolus administration of intravenous contrast. RADIATION DOSE REDUCTION: This exam was performed according to the departmental dose-optimization program which includes automated exposure control, adjustment of the mA and/or kV according to patient size and/or use of iterative reconstruction technique. CONTRAST:  126m OMNIPAQUE IOHEXOL 300 MG/ML  SOLN COMPARISON:  None Available. FINDINGS: Lower chest: Minimal atelectasis or scarring within the right lower lobe. Trace right pleural effusion. Calcified left lower lobe granuloma. Moderate hiatal hernia. Hepatobiliary: The liver is unremarkable without focal abnormality. Gallbladder is moderately distended, with multiple noncalcified gallstones identified. There is mild gallbladder wall thickening and pericholecystic fat stranding, with loss of normal fat plane between the gallbladder neck, proximal duodenum, and gastric antrum. Differential diagnosis would include acute/chronic cholecystitis versus underlying neoplasm. Correlation with upper endoscopy/EUS and/or MRI may be useful. Pancreas: Unremarkable. No pancreatic ductal dilatation or surrounding inflammatory changes. Spleen: Normal in size without focal  abnormality. Adrenals/Urinary Tract: No urinary tract calculi or obstructive uropathy. No worrisome solid renal lesions. The adrenals and bladder are unremarkable. Stomach/Bowel: As discussed above, there is abnormal wall thickening involving the gastric antrum and proximal duodenum, obscuring the fat planes between the structures and the gallbladder neck. There is a possible mass involving the medial margin of the first portion duodenum, measuring 2.7 x 2.7 x 1.6 cm, reference axial image 35/3 and coronal image 43/6. There may be a small  central ulceration seen on the coronal reconstruction. Again, correlation with upper endoscopy/EUS may be useful. There is no bowel obstruction or ileus. Normal appendix right lower quadrant. Moderate retained stool throughout the colon, most pronounced within the rectal vault, consistent with constipation. Vascular/Lymphatic: Aortic atherosclerosis. No enlarged abdominal or pelvic lymph nodes. Reproductive: Prostate is unremarkable. Other: No free fluid or free intraperitoneal gas. No abdominal wall hernia. Musculoskeletal: Anterior wedge compression deformity of L1 with greater than 75% loss of height and 7 mm of retropulsion appears chronic. No visible fracture line, and there is no paraspinal soft tissue swelling. No other acute bony abnormalities are identified. Unremarkable right hip arthroplasty. Reconstructed images demonstrate no additional findings. IMPRESSION: 1. Abnormal wall thickening along the medial aspect of the proximal duodenum, with loss of normal fat plane between the duodenum, gastric antrum, and gallbladder neck as above. Underlying neoplasm or duodenal ulcer and reactive change is suspected. Correlation with upper endoscopy may be useful. 2. Multiple noncalcified gallstones, with mild gallbladder wall thickening and pericholecystic fat stranding as above. Findings could reflect secondary inflammatory changes related to the duodenal process described above,  no acute or chronic cholecystitis could give a similar appearance. Please correlate with clinical presentation. Additional evaluation with MRI or nuclear medicine hepatobiliary scan could be performed for further characterization. 3. Moderate hiatal hernia. 4. Age indeterminate L1 compression fracture, likely chronic as stated above. 7 mm retropulsion results in central canal narrowing. 5. Significant colonic fecal retention consistent with constipation. 6.  Aortic Atherosclerosis (ICD10-I70.0). These results will be called to the ordering clinician or representative by the Radiologist Assistant, and communication documented in the PACS or [employer]. Electronically Signed   By: Randa Ngo M.D.   On: 04/11/2022 17:59   CT HEAD W & WO CONTRAST (5MM)  Result Date: 04/11/2022 CLINICAL DATA:  Mental status change, unknown cause. Unintentional weight loss. Disorientation. EXAM: CT HEAD WITHOUT AND WITH CONTRAST TECHNIQUE: Contiguous axial images were obtained from the base of the skull through the vertex without and with intravenous contrast. RADIATION DOSE REDUCTION: This exam was performed according to the departmental dose-optimization program which includes automated exposure control, adjustment of the mA and/or kV according to patient size and/or use of iterative reconstruction technique. CONTRAST:  117m OMNIPAQUE IOHEXOL 300 MG/ML  SOLN COMPARISON:  Noncontrast head CT 05/25/2017 FINDINGS: Brain: There is no evidence of an acute infarct, intracranial hemorrhage, mass, midline shift, or extra-axial fluid collection. Moderate cerebral atrophy has progressed from the prior study including somewhat prominent volume loss in the mesial temporal lobes. Hypodensities in the cerebral white matter bilaterally have progressed and are nonspecific but compatible with mild-to-moderate chronic small vessel ischemic disease. No abnormal enhancement is identified. Vascular: Calcified atherosclerosis at the skull base.  Gross patency of the dural venous sinuses and large intracranial arteries. Skull: No fracture or suspicious osseous lesion. Sinuses/Orbits: Visualized paranasal sinuses and mastoid air cells are clear. Unremarkable orbits. Other: None. IMPRESSION: 1. No evidence of acute intracranial abnormality or mass. 2. Progressive cerebral atrophy and chronic small vessel ischemic disease. Electronically Signed   By: ALogan BoresM.D.   On: 04/11/2022 14:51    Assessment/Plan ***   Family/ staff Communication: Discussed plan of care with resident and charge nurse  Labs/tests ordered:     MDurenda Age DNP, MSN, FNP-BC [facility] [phone](Monday-Friday 8:00 a.m. - 5:00 p.m.) [phone](after hours)

## 2022-05-11 ENCOUNTER — Other Ambulatory Visit: Payer: Self-pay

## 2022-05-11 ENCOUNTER — Encounter (HOSPITAL_COMMUNITY): Payer: Self-pay

## 2022-05-11 ENCOUNTER — Inpatient Hospital Stay (HOSPITAL_COMMUNITY)
Admission: EM | Admit: 2022-05-11 | Discharge: 2022-05-13 | DRG: 375 | Disposition: A | Discharge status: Skilled Nursing Facility | Payer: Medicare PPO | Source: Skilled Nursing Facility | Attending: Internal Medicine | Admitting: Internal Medicine

## 2022-05-11 ENCOUNTER — Emergency Department (HOSPITAL_COMMUNITY): Payer: Medicare PPO

## 2022-05-11 DIAGNOSIS — Z79899 Other long term (current) drug therapy: Secondary | ICD-10-CM | POA: Diagnosis not present

## 2022-05-11 DIAGNOSIS — R1111 Vomiting without nausea: Secondary | ICD-10-CM | POA: Diagnosis not present

## 2022-05-11 DIAGNOSIS — Z66 Do not resuscitate: Secondary | ICD-10-CM | POA: Diagnosis present

## 2022-05-11 DIAGNOSIS — K3189 Other diseases of stomach and duodenum: Secondary | ICD-10-CM | POA: Diagnosis not present

## 2022-05-11 DIAGNOSIS — K819 Cholecystitis, unspecified: Secondary | ICD-10-CM | POA: Diagnosis present

## 2022-05-11 DIAGNOSIS — R410 Disorientation, unspecified: Secondary | ICD-10-CM | POA: Diagnosis not present

## 2022-05-11 DIAGNOSIS — Z833 Family history of diabetes mellitus: Secondary | ICD-10-CM | POA: Diagnosis not present

## 2022-05-11 DIAGNOSIS — C170 Malignant neoplasm of duodenum: Secondary | ICD-10-CM | POA: Diagnosis present

## 2022-05-11 DIAGNOSIS — G4730 Sleep apnea, unspecified: Secondary | ICD-10-CM | POA: Diagnosis present

## 2022-05-11 DIAGNOSIS — R634 Abnormal weight loss: Secondary | ICD-10-CM | POA: Diagnosis present

## 2022-05-11 DIAGNOSIS — I959 Hypotension, unspecified: Secondary | ICD-10-CM | POA: Diagnosis not present

## 2022-05-11 DIAGNOSIS — Z87891 Personal history of nicotine dependence: Secondary | ICD-10-CM

## 2022-05-11 DIAGNOSIS — R1084 Generalized abdominal pain: Secondary | ICD-10-CM | POA: Diagnosis present

## 2022-05-11 DIAGNOSIS — Z820 Family history of epilepsy and other diseases of the nervous system: Secondary | ICD-10-CM

## 2022-05-11 DIAGNOSIS — R109 Unspecified abdominal pain: Secondary | ICD-10-CM | POA: Diagnosis present

## 2022-05-11 DIAGNOSIS — G629 Polyneuropathy, unspecified: Secondary | ICD-10-CM | POA: Diagnosis present

## 2022-05-11 DIAGNOSIS — K82A2 Perforation of gallbladder in cholecystitis: Secondary | ICD-10-CM | POA: Diagnosis present

## 2022-05-11 DIAGNOSIS — Z8249 Family history of ischemic heart disease and other diseases of the circulatory system: Secondary | ICD-10-CM

## 2022-05-11 DIAGNOSIS — F0390 Unspecified dementia without behavioral disturbance: Secondary | ICD-10-CM | POA: Diagnosis present

## 2022-05-11 DIAGNOSIS — Z86718 Personal history of other venous thrombosis and embolism: Secondary | ICD-10-CM | POA: Diagnosis not present

## 2022-05-11 DIAGNOSIS — R1011 Right upper quadrant pain: Secondary | ICD-10-CM | POA: Diagnosis not present

## 2022-05-11 DIAGNOSIS — K6389 Other specified diseases of intestine: Secondary | ICD-10-CM | POA: Diagnosis not present

## 2022-05-11 DIAGNOSIS — Z841 Family history of disorders of kidney and ureter: Secondary | ICD-10-CM | POA: Diagnosis not present

## 2022-05-11 DIAGNOSIS — Z801 Family history of malignant neoplasm of trachea, bronchus and lung: Secondary | ICD-10-CM

## 2022-05-11 DIAGNOSIS — R112 Nausea with vomiting, unspecified: Secondary | ICD-10-CM | POA: Diagnosis present

## 2022-05-11 DIAGNOSIS — N281 Cyst of kidney, acquired: Secondary | ICD-10-CM | POA: Diagnosis not present

## 2022-05-11 DIAGNOSIS — Z79890 Hormone replacement therapy (postmenopausal): Secondary | ICD-10-CM | POA: Diagnosis not present

## 2022-05-11 DIAGNOSIS — Z7401 Bed confinement status: Secondary | ICD-10-CM | POA: Diagnosis not present

## 2022-05-11 DIAGNOSIS — K828 Other specified diseases of gallbladder: Secondary | ICD-10-CM | POA: Diagnosis not present

## 2022-05-11 DIAGNOSIS — F419 Anxiety disorder, unspecified: Secondary | ICD-10-CM | POA: Diagnosis present

## 2022-05-11 DIAGNOSIS — Z96641 Presence of right artificial hip joint: Secondary | ICD-10-CM | POA: Diagnosis present

## 2022-05-11 DIAGNOSIS — E7800 Pure hypercholesterolemia, unspecified: Secondary | ICD-10-CM | POA: Diagnosis present

## 2022-05-11 DIAGNOSIS — I10 Essential (primary) hypertension: Secondary | ICD-10-CM | POA: Diagnosis present

## 2022-05-11 DIAGNOSIS — Z515 Encounter for palliative care: Secondary | ICD-10-CM | POA: Diagnosis not present

## 2022-05-11 DIAGNOSIS — E039 Hypothyroidism, unspecified: Secondary | ICD-10-CM | POA: Diagnosis present

## 2022-05-11 DIAGNOSIS — R531 Weakness: Secondary | ICD-10-CM | POA: Diagnosis not present

## 2022-05-11 LAB — CBC WITH DIFFERENTIAL/PLATELET
Abs Immature Granulocytes: 0.66 10*3/uL — ABNORMAL HIGH (ref 0.00–0.07)
Basophils Absolute: 0.1 10*3/uL (ref 0.0–0.1)
Basophils Relative: 1 %
Eosinophils Absolute: 0 10*3/uL (ref 0.0–0.5)
Eosinophils Relative: 0 %
HCT: 40.1 % (ref 39.0–52.0)
Hemoglobin: 12.8 g/dL — ABNORMAL LOW (ref 13.0–17.0)
Immature Granulocytes: 5 %
Lymphocytes Relative: 5 %
Lymphs Abs: 0.7 10*3/uL (ref 0.7–4.0)
MCH: 29 pg (ref 26.0–34.0)
MCHC: 31.9 g/dL (ref 30.0–36.0)
MCV: 90.7 fL (ref 80.0–100.0)
Monocytes Absolute: 0.4 10*3/uL (ref 0.1–1.0)
Monocytes Relative: 3 %
Neutro Abs: 11.5 10*3/uL — ABNORMAL HIGH (ref 1.7–7.7)
Neutrophils Relative %: 86 %
Platelets: 250 10*3/uL (ref 150–400)
RBC: 4.42 MIL/uL (ref 4.22–5.81)
RDW: 17.5 % — ABNORMAL HIGH (ref 11.5–15.5)
WBC: 13.3 10*3/uL — ABNORMAL HIGH (ref 4.0–10.5)
nRBC: 0 % (ref 0.0–0.2)

## 2022-05-11 LAB — COMPREHENSIVE METABOLIC PANEL
ALT: 14 U/L (ref 0–44)
AST: 26 U/L (ref 15–41)
Albumin: 2.8 g/dL — ABNORMAL LOW (ref 3.5–5.0)
Alkaline Phosphatase: 85 U/L (ref 38–126)
Anion gap: 15 (ref 5–15)
BUN: 14 mg/dL (ref 8–23)
CO2: 21 mmol/L — ABNORMAL LOW (ref 22–32)
Calcium: 8.4 mg/dL — ABNORMAL LOW (ref 8.9–10.3)
Chloride: 103 mmol/L (ref 98–111)
Creatinine, Ser: 0.88 mg/dL (ref 0.61–1.24)
GFR, Estimated: >60 mL/min (ref >60)
Glucose, Bld: 132 mg/dL — ABNORMAL HIGH (ref 70–99)
Potassium: 4 mmol/L (ref 3.5–5.1)
Sodium: 139 mmol/L (ref 135–145)
Total Bilirubin: 1.5 mg/dL — ABNORMAL HIGH (ref 0.3–1.2)
Total Protein: 6.3 g/dL — ABNORMAL LOW (ref 6.5–8.1)

## 2022-05-11 LAB — LIPASE, BLOOD: Lipase: 31 U/L (ref 11–51)

## 2022-05-11 MED ORDER — IOHEXOL 300 MG/ML  SOLN
100.0000 mL | Freq: Once | INTRAMUSCULAR | Status: AC | PRN
  Administered 2022-05-11: 100 mL via INTRAVENOUS

## 2022-05-11 NOTE — ED Triage Notes (Signed)
Arrives EMS from "[facility]" C/o 1 episode of vomiting. Facility seeking evaluation.

## 2022-05-12 ENCOUNTER — Encounter (HOSPITAL_COMMUNITY): Payer: Self-pay | Admitting: Internal Medicine

## 2022-05-12 DIAGNOSIS — R932 Abnormal findings on diagnostic imaging of liver and biliary tract: Secondary | ICD-10-CM

## 2022-05-12 DIAGNOSIS — Z96641 Presence of right artificial hip joint: Secondary | ICD-10-CM | POA: Diagnosis present

## 2022-05-12 DIAGNOSIS — F0390 Unspecified dementia without behavioral disturbance: Secondary | ICD-10-CM | POA: Diagnosis present

## 2022-05-12 DIAGNOSIS — Z66 Do not resuscitate: Secondary | ICD-10-CM | POA: Diagnosis present

## 2022-05-12 DIAGNOSIS — K82A2 Perforation of gallbladder in cholecystitis: Secondary | ICD-10-CM | POA: Diagnosis present

## 2022-05-12 DIAGNOSIS — Z8249 Family history of ischemic heart disease and other diseases of the circulatory system: Secondary | ICD-10-CM | POA: Diagnosis not present

## 2022-05-12 DIAGNOSIS — Z801 Family history of malignant neoplasm of trachea, bronchus and lung: Secondary | ICD-10-CM | POA: Diagnosis not present

## 2022-05-12 DIAGNOSIS — R109 Unspecified abdominal pain: Secondary | ICD-10-CM | POA: Diagnosis not present

## 2022-05-12 DIAGNOSIS — R634 Abnormal weight loss: Secondary | ICD-10-CM | POA: Diagnosis present

## 2022-05-12 DIAGNOSIS — Z79890 Hormone replacement therapy (postmenopausal): Secondary | ICD-10-CM | POA: Diagnosis not present

## 2022-05-12 DIAGNOSIS — R1084 Generalized abdominal pain: Secondary | ICD-10-CM | POA: Diagnosis present

## 2022-05-12 DIAGNOSIS — E7800 Pure hypercholesterolemia, unspecified: Secondary | ICD-10-CM | POA: Diagnosis present

## 2022-05-12 DIAGNOSIS — I10 Essential (primary) hypertension: Secondary | ICD-10-CM | POA: Diagnosis present

## 2022-05-12 DIAGNOSIS — Z515 Encounter for palliative care: Secondary | ICD-10-CM | POA: Diagnosis not present

## 2022-05-12 DIAGNOSIS — G4730 Sleep apnea, unspecified: Secondary | ICD-10-CM | POA: Diagnosis present

## 2022-05-12 DIAGNOSIS — G629 Polyneuropathy, unspecified: Secondary | ICD-10-CM | POA: Diagnosis present

## 2022-05-12 DIAGNOSIS — Z820 Family history of epilepsy and other diseases of the nervous system: Secondary | ICD-10-CM | POA: Diagnosis not present

## 2022-05-12 DIAGNOSIS — Z79899 Other long term (current) drug therapy: Secondary | ICD-10-CM | POA: Diagnosis not present

## 2022-05-12 DIAGNOSIS — R112 Nausea with vomiting, unspecified: Secondary | ICD-10-CM | POA: Diagnosis present

## 2022-05-12 DIAGNOSIS — K819 Cholecystitis, unspecified: Secondary | ICD-10-CM | POA: Diagnosis present

## 2022-05-12 DIAGNOSIS — Z86718 Personal history of other venous thrombosis and embolism: Secondary | ICD-10-CM | POA: Diagnosis not present

## 2022-05-12 DIAGNOSIS — C170 Malignant neoplasm of duodenum: Secondary | ICD-10-CM | POA: Diagnosis present

## 2022-05-12 DIAGNOSIS — E039 Hypothyroidism, unspecified: Secondary | ICD-10-CM | POA: Diagnosis present

## 2022-05-12 DIAGNOSIS — Z833 Family history of diabetes mellitus: Secondary | ICD-10-CM | POA: Diagnosis not present

## 2022-05-12 DIAGNOSIS — Z87891 Personal history of nicotine dependence: Secondary | ICD-10-CM | POA: Diagnosis not present

## 2022-05-12 DIAGNOSIS — F419 Anxiety disorder, unspecified: Secondary | ICD-10-CM | POA: Diagnosis present

## 2022-05-12 DIAGNOSIS — Z841 Family history of disorders of kidney and ureter: Secondary | ICD-10-CM | POA: Diagnosis not present

## 2022-05-12 LAB — BASIC METABOLIC PANEL
Anion gap: 8 (ref 5–15)
BUN: 14 mg/dL (ref 8–23)
CO2: 28 mmol/L (ref 22–32)
Calcium: 8.7 mg/dL — ABNORMAL LOW (ref 8.9–10.3)
Chloride: 103 mmol/L (ref 98–111)
Creatinine, Ser: 0.88 mg/dL (ref 0.61–1.24)
GFR, Estimated: >60 mL/min (ref >60)
Glucose, Bld: 126 mg/dL — ABNORMAL HIGH (ref 70–99)
Potassium: 3.7 mmol/L (ref 3.5–5.1)
Sodium: 139 mmol/L (ref 135–145)

## 2022-05-12 LAB — CBC WITH DIFFERENTIAL/PLATELET
Abs Immature Granulocytes: 0.27 10*3/uL — ABNORMAL HIGH (ref 0.00–0.07)
Basophils Absolute: 0.1 10*3/uL (ref 0.0–0.1)
Basophils Relative: 1 %
Eosinophils Absolute: 0 10*3/uL (ref 0.0–0.5)
Eosinophils Relative: 0 %
HCT: 38.3 % — ABNORMAL LOW (ref 39.0–52.0)
Hemoglobin: 12.2 g/dL — ABNORMAL LOW (ref 13.0–17.0)
Immature Granulocytes: 3 %
Lymphocytes Relative: 10 %
Lymphs Abs: 1 10*3/uL (ref 0.7–4.0)
MCH: 28.4 pg (ref 26.0–34.0)
MCHC: 31.9 g/dL (ref 30.0–36.0)
MCV: 89.3 fL (ref 80.0–100.0)
Monocytes Absolute: 0.3 10*3/uL (ref 0.1–1.0)
Monocytes Relative: 3 %
Neutro Abs: 8.4 10*3/uL — ABNORMAL HIGH (ref 1.7–7.7)
Neutrophils Relative %: 83 %
Platelets: 263 10*3/uL (ref 150–400)
RBC: 4.29 MIL/uL (ref 4.22–5.81)
RDW: 17.6 % — ABNORMAL HIGH (ref 11.5–15.5)
WBC: 10 10*3/uL (ref 4.0–10.5)
nRBC: 0 % (ref 0.0–0.2)

## 2022-05-12 LAB — URINALYSIS, ROUTINE W REFLEX MICROSCOPIC
Bilirubin Urine: NEGATIVE
Glucose, UA: NEGATIVE mg/dL
Hgb urine dipstick: NEGATIVE
Ketones, ur: 20 mg/dL — AB
Leukocytes,Ua: NEGATIVE
Nitrite: NEGATIVE
Protein, ur: NEGATIVE mg/dL
Specific Gravity, Urine: 1.005 (ref 1.005–1.030)
pH: 5 (ref 5.0–8.0)

## 2022-05-12 LAB — HEPATIC FUNCTION PANEL
ALT: 16 U/L (ref 0–44)
AST: 21 U/L (ref 15–41)
Albumin: 2.8 g/dL — ABNORMAL LOW (ref 3.5–5.0)
Alkaline Phosphatase: 80 U/L (ref 38–126)
Bilirubin, Direct: 0.3 mg/dL — ABNORMAL HIGH (ref 0.0–0.2)
Indirect Bilirubin: 1 mg/dL — ABNORMAL HIGH (ref 0.3–0.9)
Total Bilirubin: 1.3 mg/dL — ABNORMAL HIGH (ref 0.3–1.2)
Total Protein: 6.2 g/dL — ABNORMAL LOW (ref 6.5–8.1)

## 2022-05-12 LAB — GLUCOSE, CAPILLARY: Glucose-Capillary: 129 mg/dL — ABNORMAL HIGH (ref 70–99)

## 2022-05-12 MED ORDER — QUETIAPINE FUMARATE 25 MG PO TABS
25.0000 mg | ORAL_TABLET | Freq: Two times a day (BID) | ORAL | Status: DC
  Administered 2022-05-12: 25 mg via ORAL
  Filled 2022-05-12 (×3): qty 1

## 2022-05-12 MED ORDER — HYDROXYZINE HCL 10 MG PO TABS
10.0000 mg | ORAL_TABLET | Freq: Two times a day (BID) | ORAL | Status: DC | PRN
  Filled 2022-05-12: qty 1

## 2022-05-12 MED ORDER — PIPERACILLIN-TAZOBACTAM 3.375 G IVPB 30 MIN
3.3750 g | Freq: Once | INTRAVENOUS | Status: AC
  Administered 2022-05-12: 3.375 g via INTRAVENOUS
  Filled 2022-05-12: qty 50

## 2022-05-12 MED ORDER — PANTOPRAZOLE SODIUM 40 MG IV SOLR
40.0000 mg | INTRAVENOUS | Status: DC
Start: 2022-05-12 — End: 2022-05-12
  Administered 2022-05-12: 40 mg via INTRAVENOUS
  Filled 2022-05-12: qty 10

## 2022-05-12 MED ORDER — DEXTROSE IN LACTATED RINGERS 5 % IV SOLN: INTRAVENOUS | Status: AC

## 2022-05-12 MED ORDER — PIPERACILLIN-TAZOBACTAM 3.375 G IVPB
3.3750 g | Freq: Three times a day (TID) | INTRAVENOUS | Status: DC
  Administered 2022-05-12 – 2022-05-13 (×3): 3.375 g via INTRAVENOUS
  Filled 2022-05-12 (×3): qty 50

## 2022-05-12 MED ORDER — MORPHINE SULFATE (PF) 2 MG/ML IV SOLN
1.0000 mg | INTRAVENOUS | Status: DC | PRN
  Administered 2022-05-12 – 2022-05-13 (×3): 1 mg via INTRAVENOUS
  Filled 2022-05-12 (×3): qty 1

## 2022-05-12 MED ORDER — LEVOTHYROXINE SODIUM 100 MCG/5ML IV SOLN
37.5000 ug | Freq: Every day | INTRAVENOUS | Status: DC
  Filled 2022-05-12: qty 5

## 2022-05-12 MED ORDER — SERTRALINE HCL 50 MG PO TABS
50.0000 mg | ORAL_TABLET | Freq: Every day | ORAL | Status: DC
  Filled 2022-05-12 (×2): qty 1

## 2022-05-12 MED ORDER — OXYCODONE HCL 5 MG PO TABS: 5.0000 mg | ORAL_TABLET | ORAL | 0 refills | Status: DC | PRN

## 2022-05-12 MED ORDER — MIRTAZAPINE 15 MG PO TABS
7.5000 mg | ORAL_TABLET | Freq: Every day | ORAL | Status: DC
  Administered 2022-05-12: 7.5 mg via ORAL
  Filled 2022-05-12: qty 1

## 2022-05-12 MED ORDER — TAMSULOSIN HCL 0.4 MG PO CAPS
0.4000 mg | ORAL_CAPSULE | Freq: Every day | ORAL | Status: DC
  Administered 2022-05-12: 0.4 mg via ORAL
  Filled 2022-05-12: qty 1

## 2022-05-12 MED ORDER — OXYCODONE HCL 5 MG PO TABS
5.0000 mg | ORAL_TABLET | ORAL | Status: DC | PRN
  Filled 2022-05-12: qty 1

## 2022-05-12 MED ORDER — ACETAMINOPHEN 325 MG PO TABS
650.0000 mg | ORAL_TABLET | Freq: Four times a day (QID) | ORAL | Status: DC | PRN
  Filled 2022-05-12: qty 2

## 2022-05-12 MED ORDER — MECLIZINE HCL 25 MG PO TABS: 25.0000 mg | ORAL_TABLET | Freq: Three times a day (TID) | ORAL | Status: DC | PRN

## 2022-05-12 MED ORDER — ACETAMINOPHEN 650 MG RE SUPP: 650.0000 mg | Freq: Four times a day (QID) | RECTAL | Status: DC | PRN

## 2022-05-12 NOTE — Plan of Care (Signed)
  Problem: Education: Goal: Knowledge of General Education information will improve Description: Including pain rating scale, medication(s)/side effects and non-pharmacologic comfort measures Outcome: Not Progressing   Problem: Health Behavior/Discharge Planning: Goal: Ability to manage health-related needs will improve Outcome: Not Progressing   Problem: Nutrition: Goal: Adequate nutrition will be maintained Outcome: Not Progressing   Pt is confused, but follows simple commands. He is currently NPO and no nutrition given ordered at this time.

## 2022-05-12 NOTE — Progress Notes (Signed)
A consult was received from an ED physician for Zosyn per pharmacy dosing.  The patient's profile has been reviewed for ht/wt/allergies/indication/available labs.    A one time order has been placed for Zosyn 3.375gm IV x 1.    Further antibiotics/pharmacy consults should be ordered by admitting physician if indicated.                       Thank you, Everette Rank, PharmD 05/12/2022  12:51 AM

## 2022-05-12 NOTE — Progress Notes (Addendum)
[facility] PROGRESS NOTE   [name] PYP:[id number] DOB: Aug 16, 1931 DOA: 05/11/2022  PCP: Mast, Man X, NP  Brief History/Interval Summary: 88 y.o. male with history of dementia, hyperlipidemia, hypothyroidism was brought to the ER after patient had an episode of nausea vomiting.  Reviewing patient's chart patient has been recently following with gastroenterologist since patient has been a poor appetite and abdominal discomfort.  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  Patient was sent over due to nausea vomiting.  CT scan again raise concern for duodenal inflammation but also showed possible gallbladder inflammation and perforation.  Patient was hospitalized for further management.  Consultants: General surgery  Procedures: None    Subjective/Interval History: Patient confused.  Not able to answer questions appropriately.    Assessment/Plan:  Nausea vomiting with abnormal appearing gallbladder/concern for duodenal inflammation versus neoplasm Concern was for cholecystitis and gallbladder perforation.  Patient started on Zosyn. Abdomen is fairly benign on examination. Patient has been seen by general surgery.  Discussed with Dr. Marlou Starks this morning.  We reviewed the imaging studies together.  He does not feel that there is any role for surgical intervention.  Percutaneous drain can be considered however it may be very challenging to reach the gallbladder due to its anomalous position. Concern was also raised regarding the findings noted in the duodenum. These findings have been evaluated by gastroenterology.  Discussed with patient's son this morning who mentioned that Dr. Loletha Carrow with gastroenterology feels that the findings are strongly suggestive of malignancy considering patient's significant weight loss.  However due to his dementia he is not a good candidate for any invasive procedures or interventions.  Taking into consideration all of the above his life  expectancy is thought to be less than 6 months. The son and other family members including patient's wife and the other son agree.  They are agreeable to transition to hospice/comfort care. Social worker has been consulted to assist with same. We will continue Zosyn while he is here in the hospital.  Will not be discharged on any antibiotics.  Does not appear to be in a lot of discomfort.  Treat nausea/vomiting if symptoms recur.  Hypothyroidism Continue with Synthroid  History of dementia Very confused which apparently is his baseline.  Home medication list reviewed.  Noted to be on mirtazapine, Seroquel and sertraline which will all be reinitiated.  Goals of care See above.  Transition to hospice/comfort care.  We will discontinue all nonessential medications.  Reintroduce diet as tolerated.  DVT Prophylaxis: SCDs Code Status: DNR Family Communication: Discussed with patient's son Disposition Plan: Hospice referral placed.  Hopefully discharge back to his nursing facility once hospice has been established.  His wife is also at the facility and the son would like for them to be together as much as possible.  Status is: Inpatient Remains inpatient appropriate because: Nausea vomiting, concern for cholecystitis    Medications: Scheduled:  mirtazapine  7.5 mg Oral QHS   QUEtiapine  25 mg Oral BID   sertraline  50 mg Oral Daily   tamsulosin  0.4 mg Oral QHS   Continuous:  dextrose 5% lactated ringers 75 mL/hr at 05/12/22 0655   piperacillin-tazobactam (ZOSYN)  IV 3.375 g (05/12/22 1041)   IWP:[id number] **OR** acetaminophen, hydrOXYzine, meclizine  Antibiotics: Anti-infectives (From admission, onward)    Start     Dose/Rate Route Frequency Ordered Stop   05/12/22 1000  piperacillin-tazobactam (ZOSYN) IVPB 3.375 g        3.375 g  12.5 mL/hr over 240 Minutes Intravenous Every 8 hours 05/12/22 0414     05/12/22 0100  piperacillin-tazobactam (ZOSYN) IVPB 3.375 g        3.375  g 100 mL/hr over 30 Minutes Intravenous  Once 05/12/22 0051 05/12/22 0226       Objective:  Vital Signs  Vitals:   05/12/22 0209 05/12/22 0209 05/12/22 0316 05/12/22 0749  BP: 120/72  113/73 107/62  Pulse: 65  86 69  Resp: '19  20 18  '$ Temp:  97.8 F (36.6 C) 97.9 F (36.6 C) (!) 97.4 F (36.3 C)  TempSrc:  Axillary  Oral  SpO2: 93%  92% 97%    Intake/Output Summary (Last 24 hours) at 05/12/2022 1038 Last data filed at 05/12/2022 5462 Gross per 24 hour  Intake 237.55 ml  Output --  Net 237.55 ml   There were no vitals filed for this visit.  General appearance: Awake alert.  In no distress.  Distracted/confused. Resp: Clear to auscultation bilaterally.  Normal effort Cardio: S1-S2 is normal regular.  No S3-S4.  No rubs murmurs or bruit GI: Abdomen is soft.  Nontender nondistended.  Bowel sounds are sluggish.  No obvious masses organomegaly. Extremities: No edema.  Noted to be moving all of his extremities Neurologic: Disoriented.  No focal neurological deficits.    Lab Results:  Data Reviewed: I have personally reviewed following labs and reports of the imaging studies  CBC: Recent Labs  Lab 05/11/22 2255 05/12/22 0444  WBC 13.3* 10.0  NEUTROABS 11.5* 8.4*  HGB 12.8* 12.2*  HCT 40.1 38.3*  MCV 90.7 89.3  PLT 250 703    Basic Metabolic Panel: Recent Labs  Lab 05/11/22 2255 05/12/22 0444  NA 139 139  K 4.0 3.7  CL 103 103  CO2 21* 28  GLUCOSE 132* 126*  BUN 14 14  CREATININE 0.88 0.88  CALCIUM 8.4* 8.7*    GFR: Estimated Creatinine Clearance: 58 mL/min (by C-G formula based on SCr of 0.88 mg/dL).  Liver Function Tests: Recent Labs  Lab 05/11/22 2255 05/12/22 0444  AST 26 21  ALT 14 16  ALKPHOS 85 80  BILITOT 1.5* 1.3*  PROT 6.3* 6.2*  ALBUMIN 2.8* 2.8*    Recent Labs  Lab 05/11/22 2255  LIPASE 31   CBG: Recent Labs  Lab 05/12/22 0546  GLUCAP 129*      Recent Results (from the past 240 hour(s))  Blood culture (routine x 2)      Status: None (Preliminary result)   Collection Time: 05/12/22  2:00 AM   Specimen: BLOOD  Result Value Ref Range Status   Specimen Description   Final    BLOOD BLOOD RIGHT WRIST Performed at [facility] [address], [place], [place] [address]    Special Requests IN PEDIATRIC BOTTLE Blood Culture adequate volume  Final   Culture   Final    NO GROWTH < 12 HOURS Performed at [facility], [place] [address] [place], [place] [address]    Report Status PENDING  Incomplete      Radiology Studies: CT Abdomen Pelvis W Contrast  Result Date: 05/12/2022 CLINICAL DATA:  Abdominal pain. EXAM: CT ABDOMEN AND PELVIS WITH CONTRAST TECHNIQUE: Multidetector CT imaging of the abdomen and pelvis was performed using the standard protocol following bolus administration of intravenous contrast. RADIATION DOSE REDUCTION: This exam was performed according to the departmental dose-optimization program which includes automated exposure control, adjustment of the mA and/or kV according to patient size  and/or use of iterative reconstruction technique. CONTRAST:  150m OMNIPAQUE IOHEXOL 300 MG/ML  SOLN COMPARISON:  CT abdomen pelvis dated 04/11/2022. FINDINGS: Lower chest: Partially visualized small right pleural effusion. There is a small left lung base calcified granuloma. There is advanced coronary vascular calcification. No intra-abdominal free air or free fluid. Hepatobiliary: The liver is unremarkable. There is mild biliary ductal dilatation or periportal edema. The gallbladder is contracted and appears inflamed. The gallbladder extends superiorly to the level of the diaphragm. There is fusion of the gallbladder fundus to the right diaphragm. Small pockets of air adjacent to the gallbladder fundus may be within the lumen of the gallbladder or represent focally contained perforation. Pancreas: No acute findings.  No inflammatory changes. Spleen: Normal in size without focal abnormality.  Adrenals/Urinary Tract: The adrenal glands unremarkable several left renal cysts. There is no hydronephrosis on either side. There is symmetric enhancement and excretion of contrast by both kidneys. The visualized ureters and urinary bladder appear unremarkable. Stomach/Bowel: There is a moderate size hiatal hernia. Thickened and inflamed appearance of the second portion of the duodenum with surrounding stranding. Overall decrease in the degree of inflammation compared to prior CT. Underlying mass is not excluded. Further evaluation with endoscopy, as previously recommended and if not yet performed, advised. There is focal area of thickening of the medial wall of the distal ascending colon (25/3 and coronal 68/5), likely reactive to inflammatory changes of the duodenum. There is moderate amount of stool throughout the colon. There is no bowel obstruction. The appendix is normal. Vascular/Lymphatic: Moderate aortoiliac atherosclerotic disease. The IVC is unremarkable. No portal venous gas. There is no adenopathy. Reproductive: The prostate and seminal vesicles are grossly remarkable. Other: None Musculoskeletal: Osteopenia with degenerative changes of the spine. Total right hip arthroplasty. Similar appearance of L1 compression fracture and retropulsion of the posterior cortex as seen on the prior CT. No new fracture. IMPRESSION: 1. Thickened and inflamed appearance of the second portion of the duodenum with surrounding stranding. Overall decrease in the degree of inflammation compared to prior CT. Underlying mass is not excluded. Further evaluation with endoscopy, as previously recommended and if not yet performed, advised. 2. Contracted and inflamed gallbladder. There is adhesion of the gallbladder fundus to the right hemidiaphragm. Small pockets of air adjacent to the gallbladder fundus may be within the lumen of the gallbladder or represent focally contained perforation. 3. Moderate size hiatal hernia. 4.  Partially visualized small right pleural effusion. 5. Similar appearance of L1 compression fracture and retropulsion of the posterior cortex as seen on the prior CT. 6. Aortic Atherosclerosis (ICD10-I70.0). Electronically Signed   By: AAnner CreteM.D.   On: 05/12/2022 00:30       LOS: 0 days   [facility] Pager on [email or web address]  05/12/2022, 10:38 AM

## 2022-05-12 NOTE — TOC Initial Note (Addendum)
Transition of Care [facility]) - Initial/Assessment Note    Patient Details  Name: [name] MRN: [id number] Date of Birth: 1931/07/15  Transition [facility]) CM/SW Contact:    Roseanne Kaufman, RN Phone Number: 05/12/2022, 11:13 AM  Clinical Narrative:    Patient from [facility]. Received TOC consult for hospice services. Left voicemail message at [facility] for a call back re: services being offered. Await a call back, then speak with family. TOC will continue to follow.               - 11:38 am This RNCM spoke with [place] who provided SW: [place] contact information for hospice services at [facility].  This RNCM spoke with patient's son Ovid Curd [facility]) who request MD give patient's wife a call to discuss current status, before hospice services are set up. Notified MD.   - 11:54am This RNCM left voicemail for Ovid Curd for call back. MD attempted to call patient's wife per pt's family request unable to reach anyone, goes to voicemail.   -12:12 MD spoke with patient's wife. This RNCM spoke with patient's son Ovid Curd who advise the family wants to proceed with hospice referral. This RNCM left voicemail message for Ermalinda Barrios and Tijuan with [facility].    - 1:39 Hospice referral made to [facility] with [facility]. Patient to discharge 05/13/22 via EMS to [facility].   TOC will continue to follow.   Expected Discharge Plan: Skilled Nursing Facility Barriers to Discharge: Continued Medical Work up   Patient Goals and CMS Choice Patient states their goals for this hospitalization and ongoing recovery are:: receive hospice services   Choice offered to / list presented to : NA  Expected Discharge Plan and Services Expected Discharge Plan: [place] In-house Referral: NA Discharge Planning Services: CM Consult Post Acute Care Choice: NA Living arrangements for the past 2 months: Single Family Home ([place])                 DME Arranged: N/A DME Agency: NA       HH Arranged: NA HH Agency: NA        Prior Living Arrangements/Services Living arrangements for the past 2 months: [place] ([place]) Lives with:: Facility Resident Patient language and need for interpreter reviewed:: Yes Do you feel safe going back to the place where you live?: Yes      Need for Family Participation in Patient Care: Yes (Comment) Care giver support system in place?: Yes (comment)   Criminal Activity/Legal Involvement Pertinent to Current Situation/Hospitalization: No - Comment as needed  Activities of Daily Living      Permission Sought/Granted Permission sought to share information with : Case Manager Permission granted to share information with : Yes, Verbal Permission Granted  Share Information with NAME: Case Manager           Emotional Assessment Appearance:: Appears stated age Attitude/Demeanor/Rapport: Gracious Affect (typically observed): Accepting     Psych Involvement: No (comment)  Admission diagnosis:  Generalized abdominal pain [R10.84] Abdominal pain [R10.9] Patient Active Problem List   Diagnosis Date Noted   Abdominal pain 05/12/2022   Abnormal abdominal CT scan 04/16/2022   Allergic rhinitis 04/09/2021   Vitamin B12 deficiency 04/09/2021   Chronic right shoulder pain 04/08/2021   Vitamin D deficiency 03/22/2021   Weight gain 10/16/2020   Anxiety 08/09/2020   GERD (gastroesophageal reflux disease) 05/10/2020   Pressure ulcer of  right hip, stage 2 ([place]) 03/15/2020   Seborrheic dermatitis 02/24/2020   Fall 01/02/2020   Left shoulder pain 11/02/2019   Osteoarthritis, multiple sites 11/02/2019   Hypothyroidism 08/10/2019   Axonal sensorimotor neuropathy 03/10/2018   Unilateral primary osteoarthritis, right knee 12/02/2016   Presence of right artificial hip joint 09/23/2016   Acute pain of left knee 09/19/2016   UTI (urinary tract  infection) 07/30/2016   Memory loss 07/28/2016   Anemia    Hypertension    Lumbar spinal stenosis    Sleep apnea    Constipation    Urinary frequency    History of fall 07/25/2016   Gait abnormality 07/25/2016   Venous thrombosis of leg 07/24/2016   Osteoarthritis of right hip 07/24/2016   Edema of both lower legs due to peripheral venous insufficiency 07/24/2016   Spinal stenosis of lumbar region 05/28/2016   Weight loss 04/08/2016   Nuclear sclerosis of both eyes 01/04/2014   Hypercholesteremia    PCP:  Mast, Man X, NP Pharmacy:   [place], [place] - [place] [address] Phone: [phone] Fax: [phone]     Social Determinants of Health (SDOH) Interventions    Readmission Risk Interventions     No data to display

## 2022-05-12 NOTE — H&P (Addendum)
History and Physical    [name] QBH:[id number] DOB: Sep 16, 1930 DOA: 05/11/2022  PCP: Mast, Man X, NP  Patient coming from: [place].  History obtained from ER physician and previous records.  Patient has dementia and is not able to provide much history.  Chief Complaint: Nausea vomiting.  HPI: [name] is a 88 y.o. male with history of dementia, hyperlipidemia, hypothyroidism was brought to the ER after patient had an episode of nausea vomiting.  Reviewing patient's chart patient has been recently following with gastroenterologist since patient has been a poor appetite and abdominal discomfort.  Recent CAT scans were showing abnormal findings.  ED Course: In the ER on exam patient has epigastric discomfort.  CT abdomen pelvis shows features concerning for inflammation around duodenum and gallbladder and possible contained perforation around the gallbladder.  Labs show leukocytosis.  ER physician did discuss with Dr. Marlou Starks on-call general surgeon who at this time recommended placing patient on IV antibiotics and will be seeing patient in consult.  Review of Systems: As per HPI, rest all negative.   Past Medical History:  Diagnosis Date   Abnormal MRI, knee    Per [facility] New Patient Packet    Anemia    Anticoagulated    Anxiety    Arthritis    Cervical disc disorder    Per [facility] New Patient Packet    Complication of anesthesia    states "[place] after surgery"caused by tramadol   Constipation    Dysuria 07/28/2016   Edema 07/24/2016   Gait abnormality 07/25/2016   Hard of hearing    Head injury    As a result of a fall, Per [facility] New Patient Packet    Hip arthritis 07/21/2016   History of fall 07/25/2016   Hypercholesteremia    Hypertension    Lumbar spinal stenosis    Memory loss 07/28/2016   mild   Obesity    Per [facility] New Patient Packet    Osteoarthritis of right hip 07/24/2016   Peripheral neuropathy 03/10/2018    Sleep apnea    cpap   Spinal stenosis of lumbar region 05/28/2016   Urinary frequency    Venous thrombosis of leg 07/24/2016   right gastrocnemius   Vertigo    Wears glasses    Weight loss 04/08/2016    Past Surgical History:  Procedure Laterality Date   BACK SURGERY  10/17, 80's   COLONOSCOPY     COLONOSCOPY     Per [facility] New Patient Packet, Dr.Schooler    ESOPHAGOGASTRODUODENOSCOPY     LUMBAR LAMINECTOMY/DECOMPRESSION MICRODISCECTOMY Right 05/28/2016   Procedure: Right Lumbar Three-Four Microdiscectomy;  Surgeon: [name], MD;  Location: [place] [facility];  Service: Neurosurgery;  Laterality: Right;   TOTAL HIP ARTHROPLASTY Right 07/21/2016   Procedure: TOTAL HIP ARTHROPLASTY ANTERIOR APPROACH;  Surgeon: [name], MD;  Location: [place];  Service: Orthopedics;  Laterality: Right;     reports that he quit smoking about 53 years ago. His smoking use included cigarettes. He has a 4.00 pack-year smoking history. He has never used smokeless tobacco. He reports that he does not drink alcohol and does not use drugs.  Allergies  Allergen Reactions   Tramadol Other (See Comments)    "does not eat" LOSS OF APPETITE    Family History  Problem Relation Age of Onset   Heart disease Mother    Transient ischemic attack Mother    Kidney failure Father  Alzheimer's disease Sister    AAA (abdominal aortic aneurysm) Sister    Lung cancer Brother    Memory loss Sister    Memory loss Sister    Heart disease Sister    Cancer Sister    Diabetes Sister    Cancer Sister     Prior to Admission medications   Medication Sig Start Date End Date Taking? Authorizing Provider  acetaminophen (TYLENOL) 500 MG tablet Take 1,000 mg by mouth daily as needed for mild pain.   Yes [provider]  atorvastatin (LIPITOR) 20 MG tablet Take 20 mg by mouth daily. 8pm.   Yes [provider]  Coenzyme Q10 (COQ10) 100 MG CAPS Take 1 capsule by mouth daily. 8am.   Yes [provider]  Cyanocobalamin (B-12) 1000 MCG TABS Take 1 tablet by mouth every other day. 8am.   Yes [provider]  fexofenadine (ALLEGRA) 180 MG tablet Take 180 mg by mouth daily.   Yes [provider]  fluticasone (FLONASE) 50 MCG/ACT nasal spray Place 2 sprays into both nostrils daily.   Yes [provider]  gabapentin (NEURONTIN) 100 MG capsule Take 200 mg by mouth at bedtime. 8pm.   Yes [provider]  hydrocortisone 2.5 % cream Apply 1 Application topically 2 (two) times daily as needed (for facial rash).   Yes [provider]  hydrOXYzine (ATARAX) 10 MG tablet Take 10 mg by mouth 2 (two) times daily as needed (for agitation).   Yes [provider]  levothyroxine (SYNTHROID) 75 MCG tablet Take 75 mcg by mouth daily before breakfast.   Yes [provider]  meclizine (ANTIVERT) 25 MG tablet Take 25 mg by mouth 3 (three) times daily as needed for dizziness.   Yes [provider]  Menthol, Topical Analgesic, (BIOFREEZE) 4 % GEL Apply 1 Application topically in the morning, at noon, in the evening, and at bedtime. Apply to right shoulder   Yes [provider]  mirtazapine (REMERON) 7.5 MG tablet Take 7.5 mg by mouth at bedtime. 04/05/22  Yes [provider]  NAT-RUL PSYLLIUM SEED HUSKS PO Take 1 tablet by mouth daily. For constipation   Yes [provider]  Nutritional Supplements (RESOURCE 2.0) LIQD Take 180 mLs by mouth in the morning and at bedtime.   Yes [provider]  nystatin (MYCOSTATIN/NYSTOP) powder Apply 1 application  topically See admin instructions. Apply to periwound and groin every other day as needed   Yes [provider]  omeprazole (PRILOSEC) 20 MG capsule Take 20 mg by mouth daily.   Yes [provider]  polyethylene glycol (MIRALAX / GLYCOLAX) 17 g packet Take 17 g by mouth daily.   Yes [provider]  promethazine (PHENERGAN) 25 MG suppository Place  25 mg rectally every 6 (six) hours as needed for nausea or vomiting.   Yes [provider]  QUEtiapine (SEROQUEL) 25 MG tablet Take 25 mg by mouth 2 (two) times daily.   Yes [provider]  sennosides-docusate sodium (SENOKOT-S) 8.6-50 MG tablet Take 2 tablets by mouth daily.   Yes [provider]  sertraline (ZOLOFT) 25 MG tablet Take 1 tablet (25 mg total) by mouth daily. Patient taking differently: Take 50 mg by mouth daily. 03/10/22  Yes Medina-Vargas, Monina C, NP  tamsulosin (FLOMAX) 0.4 MG CAPS capsule Take 0.4 mg by mouth at bedtime.   Yes [provider]  aspirin EC 81 MG tablet Take 81 mg by mouth daily. 8am. Patient not  taking: Reported on 05/12/2022    [provider]  furosemide (LASIX) 20 MG tablet Take 20 mg by mouth daily. Patient not taking: Reported on 05/12/2022    [provider]  Hydroactive Dressings (DYNADERM HYDROCOLLOID 4"X4" EX) Apply topically every 3 (three) days. Cleanse dime-sized area on right buttocks with NSS, pat dry, apply 2X2 dressing, change every 3 days.    [provider]  potassium chloride SA (KLOR-CON M) 20 MEQ tablet Take 20 mEq by mouth daily. Patient not taking: Reported on 05/12/2022    [provider]    Physical Exam: Constitutional: Moderately built and nourished. Vitals:   05/11/22 2335 05/12/22 0209 05/12/22 0209 05/12/22 0316  BP: (!) 102/57 120/72  113/73  Pulse: 79 65  86  Resp: '14 19  20  '$ Temp:   97.8 F (36.6 C) 97.9 F (36.6 C)  TempSrc:   Axillary   SpO2: 96% 93%  92%   Eyes: Anicteric no pallor. ENMT: No discharge from the ears eyes nose and mouth. Neck: No mass felt.  No neck rigidity. Respiratory: No rhonchi or crepitations. Cardiovascular: S1-S2 heard. Abdomen: Epigastric tenderness present no guarding or rigidity. Musculoskeletal: No edema. Skin: No rash. Neurologic: Alert awake oriented to his name.  Moving all extremities. Psychiatric: Oriented to his  name.   Labs on Admission: I have personally reviewed following labs and imaging studies  CBC: Recent Labs  Lab 05/11/22 2255  WBC 13.3*  NEUTROABS 11.5*  HGB 12.8*  HCT 40.1  MCV 90.7  PLT 657   Basic Metabolic Panel: Recent Labs  Lab 05/11/22 2255  NA 139  K 4.0  CL 103  CO2 21*  GLUCOSE 132*  BUN 14  CREATININE 0.88  CALCIUM 8.4*   GFR: Estimated Creatinine Clearance: 58 mL/min (by C-G formula based on SCr of 0.88 mg/dL). Liver Function Tests: Recent Labs  Lab 05/11/22 2255  AST 26  ALT 14  ALKPHOS 85  BILITOT 1.5*  PROT 6.3*  ALBUMIN 2.8*   Recent Labs  Lab 05/11/22 2255  LIPASE 31   No results for input(s): "AMMONIA" in the last 168 hours. Coagulation Profile: No results for input(s): "INR", "PROTIME" in the last 168 hours. Cardiac Enzymes: No results for input(s): "CKTOTAL", "CKMB", "CKMBINDEX", "TROPONINI" in the last 168 hours. BNP (last 3 results) No results for input(s): "PROBNP" in the last 8760 hours. HbA1C: No results for input(s): "HGBA1C" in the last 72 hours. CBG: No results for input(s): "GLUCAP" in the last 168 hours. Lipid Profile: No results for input(s): "CHOL", "HDL", "LDLCALC", "TRIG", "CHOLHDL", "LDLDIRECT" in the last 72 hours. Thyroid Function Tests: No results for input(s): "TSH", "T4TOTAL", "FREET4", "T3FREE", "THYROIDAB" in the last 72 hours. Anemia Panel: No results for input(s): "VITAMINB12", "FOLATE", "FERRITIN", "TIBC", "IRON", "RETICCTPCT" in the last 72 hours. Urine analysis:    Component Value Date/Time   COLORURINE YELLOW 05/25/2017 [place] 05/25/2017 1556   LABSPEC 1.015 05/25/2017 1556   PHURINE 7.0 05/25/2017 1556   GLUCOSEU NEGATIVE 05/25/2017 1556   HGBUR NEGATIVE 05/25/2017 1556   BILIRUBINUR NEGATIVE 05/25/2017 1556   KETONESUR NEGATIVE 05/25/2017 1556   PROTEINUR NEGATIVE 05/25/2017 1556   UROBILINOGEN 1.0 10/31/2013 1322   NITRITE NEGATIVE 05/25/2017 1556   LEUKOCYTESUR  NEGATIVE 05/25/2017 1556   Sepsis Labs: '@LABRCNTIP'$ (procalcitonin:4,lacticidven:4) )No results found for this or any previous visit (from the past 240 hour(s)).   Radiological Exams on Admission: CT Abdomen Pelvis W Contrast  Result Date: 05/12/2022 CLINICAL DATA:  Abdominal  pain. EXAM: CT ABDOMEN AND PELVIS WITH CONTRAST TECHNIQUE: Multidetector CT imaging of the abdomen and pelvis was performed using the standard protocol following bolus administration of intravenous contrast. RADIATION DOSE REDUCTION: This exam was performed according to the departmental dose-optimization program which includes automated exposure control, adjustment of the mA and/or kV according to patient size and/or use of iterative reconstruction technique. CONTRAST:  13m OMNIPAQUE IOHEXOL 300 MG/ML  SOLN COMPARISON:  CT abdomen pelvis dated 04/11/2022. FINDINGS: Lower chest: Partially visualized small right pleural effusion. There is a small left lung base calcified granuloma. There is advanced coronary vascular calcification. No intra-abdominal free air or free fluid. Hepatobiliary: The liver is unremarkable. There is mild biliary ductal dilatation or periportal edema. The gallbladder is contracted and appears inflamed. The gallbladder extends superiorly to the level of the diaphragm. There is fusion of the gallbladder fundus to the right diaphragm. Small pockets of air adjacent to the gallbladder fundus may be within the lumen of the gallbladder or represent focally contained perforation. Pancreas: No acute findings.  No inflammatory changes. Spleen: Normal in size without focal abnormality. Adrenals/Urinary Tract: The adrenal glands unremarkable several left renal cysts. There is no hydronephrosis on either side. There is symmetric enhancement and excretion of contrast by both kidneys. The visualized ureters and urinary bladder appear unremarkable. Stomach/Bowel: There is a moderate size hiatal hernia. Thickened and inflamed  appearance of the second portion of the duodenum with surrounding stranding. Overall decrease in the degree of inflammation compared to prior CT. Underlying mass is not excluded. Further evaluation with endoscopy, as previously recommended and if not yet performed, advised. There is focal area of thickening of the medial wall of the distal ascending colon (25/3 and coronal 68/5), likely reactive to inflammatory changes of the duodenum. There is moderate amount of stool throughout the colon. There is no bowel obstruction. The appendix is normal. Vascular/Lymphatic: Moderate aortoiliac atherosclerotic disease. The IVC is unremarkable. No portal venous gas. There is no adenopathy. Reproductive: The prostate and seminal vesicles are grossly remarkable. Other: None Musculoskeletal: Osteopenia with degenerative changes of the spine. Total right hip arthroplasty. Similar appearance of L1 compression fracture and retropulsion of the posterior cortex as seen on the prior CT. No new fracture. IMPRESSION: 1. Thickened and inflamed appearance of the second portion of the duodenum with surrounding stranding. Overall decrease in the degree of inflammation compared to prior CT. Underlying mass is not excluded. Further evaluation with endoscopy, as previously recommended and if not yet performed, advised. 2. Contracted and inflamed gallbladder. There is adhesion of the gallbladder fundus to the right hemidiaphragm. Small pockets of air adjacent to the gallbladder fundus may be within the lumen of the gallbladder or represent focally contained perforation. 3. Moderate size hiatal hernia. 4. Partially visualized small right pleural effusion. 5. Similar appearance of L1 compression fracture and retropulsion of the posterior cortex as seen on the prior CT. 6. Aortic Atherosclerosis (ICD10-I70.0). Electronically Signed   By: AAnner CreteM.D.   On: 05/12/2022 00:30     Assessment/Plan Principal Problem:   Abdominal pain Active  Problems:   Hypothyroidism    Abdominal pain with concerning features for possible cholecystitis with possible contained perforation and also there is features concerning for inflammation around duodenum for which patient is on IV antibiotics we will keep patient n.p.o. General surgery has been consulted.  We will also keep patient on IV Protonix gentle hydration.  Further recommendation per general surgery. History of hypothyroidism we will keep patient IV Synthroid  while NPO. History of hyperlipidemia presently NPO. Anemia follow CBC.  Since patient has possibility of cholecystitis possible perforation will need close monitoring inpatient status.   Discussed with patient's son Jeston Junkins.  Patient's son states that they are not really expecting any advanced procedures and would like patient to be most likely as much as possible kept on treatment with minimal interventions.   DVT prophylaxis: SCDs.  Avoiding anticoagulation in anticipation of procedure. Code Status: DNR.  Confirmed with patient's son. Family Communication: Discussed with patient's son. Disposition Plan: Back to facility when stable. Consults called: General surgery. Admission status: Inpatient.   Rise Patience MD [facility] Pager [phone].  If 7PM-7AM, please contact night-coverage [email or web address] Password [email or web address]  05/12/2022, 4:02 AM

## 2022-05-12 NOTE — Progress Notes (Signed)
[facility] [id number] [employer] [facility]) Hospital Liaison RN note  Received request from Clance Boll for hospice services at [facility] after discharge. Chart and patient information under review by Hospice physician.  Spoke with wife and 2 sons to initiate education related to hospice philosophy, services, and team approach to care. Patient/family verbalized understanding of information given. Per discussion, the plan is for discharge to facility by EMS on 9.5.23  DME needs discussed. There are no immediate DME needs.  Please send signed and completed DNR with patient/family. Please provide symptoms at discharge as needed for ongoing symptom management.   [facility] information and contact numbers given to family. Above information shared with Hulan Amato.  Please call with any hospice related questions or concerns.  Thank you for the opportunity to participate in this patient's care.  Jhonnie Garner, Therapist, sports, BSN [employer] [phone]

## 2022-05-12 NOTE — ED Provider Notes (Signed)
[facility] DE[facility] Provider Note   CSN: [id number] Arrival date & Adkins: 05/11/22  2215     History  No chief complaint on file.   [name] is a 88 y.o. male.  HPI   Medical history including peripheral Adkins, [name], [name], [name] is endorsing any chest pain shortness of breath stomach pains nausea vomiting diarrhea, [name] is unsure of when [name] had his last bowel movement, there is no other complaints.  HPI is limited as patient is a poor historian  Reviewed patient's charts had abdominal last month, CT abdomen pelvis was obtained reveals that [name] has some inflammation around the duodenum, concern for possible ulcer versus neoplasm, [name] also had multiple gallstone without evidence of cholecystitis, recommend follow-up with GI.  I reviewed does not appear the patient has any follow-up with GI, patient has DNR form as well as a MOST form indicating limited intervention.  Home Medications Prior to Admission medications   Medication Sig Start Date End Date Taking? Authorizing Provider  acetaminophen (TYLENOL) 500 MG tablet Take 1,000 mg by mouth daily as needed for mild pain.   Yes [provider]  atorvastatin (LIPITOR) 20 MG tablet Take 20 mg by mouth daily. 8pm.   Yes [provider]  Coenzyme Q10 (COQ10) 100 MG CAPS Take 1 capsule by mouth daily. 8am.   Yes [provider]  Cyanocobalamin (B-12) 1000 MCG TABS Take 1 tablet by mouth every other day. 8am.   Yes [provider]  fexofenadine (ALLEGRA) 180 MG tablet Take 180 mg by mouth daily.   Yes [provider]  fluticasone (FLONASE) 50 MCG/ACT nasal spray Place 2 sprays into both nostrils daily.   Yes [provider]  gabapentin (NEURONTIN) 100 MG capsule Take 200 mg by mouth at bedtime. 8pm.   Yes [provider]  hydrocortisone 2.5 % cream Apply 1 Application topically 2 (two) times daily as needed (for facial rash).   Yes [provider]  hydrOXYzine (ATARAX) 10 MG tablet Take 10 mg by mouth 2 (two) times daily as needed (for agitation).   Yes [provider]  levothyroxine (SYNTHROID) 75 MCG tablet Take 75 mcg by mouth daily before breakfast.   Yes [provider]  meclizine (ANTIVERT) 25 MG tablet Take 25 mg by mouth 3 (three) times daily as needed for dizziness.   Yes [provider]  Menthol, Topical Analgesic, (BIOFREEZE) 4 % GEL Apply 1 Application topically in the morning, at noon, in the evening, and at bedtime. Apply to right shoulder   Yes [provider]  mirtazapine (REMERON) 7.5 MG tablet Take 7.5 mg by mouth at bedtime. 04/05/22  Yes [provider]  NAT-RUL PSYLLIUM SEED HUSKS PO Take 1 tablet by mouth daily. For constipation   Yes [provider]  Nutritional Supplements (RESOURCE 2.0) LIQD Take 180 mLs by mouth in the morning and at bedtime.   Yes [provider]  nystatin (MYCOSTATIN/NYSTOP) powder Apply 1 application  topically See admin instructions. Apply to periwound and groin every other day as needed   Yes [provider]  omeprazole (PRILOSEC) 20 MG capsule Take 20 mg by mouth daily.   Yes [provider]  polyethylene glycol (MIRALAX / GLYCOLAX) 17 g packet Take 17 g by mouth daily.   Yes [provider]  promethazine (PHENERGAN) 25  MG suppository Place 25 mg rectally every 6 (six) hours as needed for nausea or vomiting.   Yes [provider]  QUEtiapine (SEROQUEL) 25 MG tablet Take 25 mg by mouth 2 (two) times daily.   Yes [provider]  sennosides-docusate sodium (SENOKOT-S) 8.6-50 MG tablet Take 2 tablets by mouth daily.   Yes [provider]  sertraline (ZOLOFT) 25 MG tablet Take 1 tablet (25 mg total) by mouth daily. Patient taking  differently: Take 50 mg by mouth daily. 03/10/22  Yes Medina-Vargas, Monina C, NP  tamsulosin (FLOMAX) 0.4 MG CAPS capsule Take 0.4 mg by mouth at bedtime.   Yes [provider]  aspirin EC 81 MG tablet Take 81 mg by mouth daily. 8am. Patient not taking: Reported on 05/12/2022    [provider]  furosemide (LASIX) 20 MG tablet Take 20 mg by mouth daily. Patient not taking: Reported on 05/12/2022    [provider]  Hydroactive Dressings (DYNADERM HYDROCOLLOID 4"X4" EX) Apply topically every 3 (three) days. Cleanse dime-sized area on right buttocks with NSS, pat dry, apply 2X2 dressing, change every 3 days.    [provider]  potassium chloride SA (KLOR-CON M) 20 MEQ tablet Take 20 mEq by mouth daily. Patient not taking: Reported on 05/12/2022    [provider]      Allergies    Tramadol    Review of Systems   Review of Systems  Constitutional:  Negative for chills and fever.  Respiratory:  Negative for shortness of breath.   Cardiovascular:  Negative for chest pain.  Gastrointestinal:  Negative for abdominal pain.  Neurological:  Negative for headaches.    Physical Exam Updated Vital Signs BP (!) 102/57   Pulse 79   Temp 98 F (36.7 C)   Resp 14   SpO2 96%  Physical Exam Vitals and nursing note reviewed.  Constitutional:      General: [name] is not in acute distress.    Appearance: [name] is not ill-appearing.  HENT:     Head: Normocephalic and atraumatic.     Nose: No congestion.     Mouth/Throat:     Mouth: Mucous membranes are moist.     Pharynx: Oropharynx is clear.  Eyes:     Conjunctiva/sclera: Conjunctivae normal.  Cardiovascular:     Rate and Rhythm: Normal rate and regular rhythm.     Pulses: Normal pulses.     Heart sounds: No murmur heard.    No friction rub. No gallop.  Pulmonary:     Effort: No respiratory distress.     Breath sounds: No wheezing, rhonchi or rales.  Abdominal:     Palpations: Abdomen is soft.      Tenderness: There is abdominal tenderness. There is no right CVA tenderness or left CVA tenderness.     Comments: Abdomen nondistended, soft, tenderness noted in the epigastric/left upper quadrant, without guarding rebound has peritoneal sign negative Murphy sign McBurney point.  Skin:    General: Skin is warm and dry.     Comments: No facial asymmetry no difficulty with word finding following two-step commands no regular weakness present.  Patient is alert to self, place, situation, cannot tell me what date is.  Neurological:     Mental Status: [name] is alert.  Psychiatric:        Mood and Affect: Mood normal.     ED Results / Procedures / Treatments   Labs (all labs ordered are listed, but only abnormal results are  displayed) Labs Reviewed  COMPREHENSIVE METABOLIC PANEL - Abnormal; Notable for the following components:      Result Value   CO2 21 (*)    Glucose, Bld 132 (*)    Calcium 8.4 (*)    Total Protein 6.3 (*)    Albumin 2.8 (*)    Total Bilirubin 1.5 (*)    All other components within normal limits  CBC WITH DIFFERENTIAL/PLATELET - Abnormal; Notable for the following components:   WBC 13.3 (*)    Hemoglobin 12.8 (*)    RDW 17.5 (*)    Neutro Abs 11.5 (*)    Abs Immature Granulocytes 0.66 (*)    All other components within normal limits  CULTURE, BLOOD (ROUTINE X 2)  CULTURE, BLOOD (ROUTINE X 2)  LIPASE, BLOOD  URINALYSIS, ROUTINE W REFLEX MICROSCOPIC    EKG None  Radiology CT Abdomen Pelvis W Contrast  Result Date: 05/12/2022 CLINICAL DATA:  Abdominal pain. EXAM: CT ABDOMEN AND PELVIS WITH CONTRAST TECHNIQUE: Multidetector CT imaging of the abdomen and pelvis was performed using the standard protocol following bolus administration of intravenous contrast. RADIATION DOSE REDUCTION: This exam was performed according to the departmental dose-optimization program which includes automated exposure control, adjustment of the mA and/or kV according to patient size and/or use  of iterative reconstruction technique. CONTRAST:  150m OMNIPAQUE IOHEXOL 300 MG/ML  SOLN COMPARISON:  CT abdomen pelvis dated 04/11/2022. FINDINGS: Lower chest: Partially visualized small right pleural effusion. There is a small left lung base calcified granuloma. There is advanced coronary vascular calcification. No intra-abdominal free air or free fluid. Hepatobiliary: The liver is unremarkable. There is mild biliary ductal dilatation or periportal edema. The gallbladder is contracted and appears inflamed. The gallbladder extends superiorly to the level of the diaphragm. There is fusion of the gallbladder fundus to the right diaphragm. Small pockets of air adjacent to the gallbladder fundus may be within the lumen of the gallbladder or represent focally contained perforation. Pancreas: No acute findings.  No inflammatory changes. Spleen: Normal in size without focal abnormality. Adrenals/Urinary Tract: The adrenal glands unremarkable several left renal cysts. There is no hydronephrosis on either side. There is symmetric enhancement and excretion of contrast by both kidneys. The visualized ureters and urinary bladder appear unremarkable. Stomach/Bowel: There is a moderate size hiatal hernia. Thickened and inflamed appearance of the second portion of the duodenum with surrounding stranding. Overall decrease in the degree of inflammation compared to prior CT. Underlying mass is not excluded. Further evaluation with endoscopy, as previously recommended and if not yet performed, advised. There is focal area of thickening of the medial wall of the distal ascending colon (25/3 and coronal 68/5), likely reactive to inflammatory changes of the duodenum. There is moderate amount of stool throughout the colon. There is no bowel obstruction. The appendix is normal. Vascular/Lymphatic: Moderate aortoiliac atherosclerotic disease. The IVC is unremarkable. No portal venous gas. There is no adenopathy. Reproductive: The prostate  and seminal vesicles are grossly remarkable. Other: None Musculoskeletal: Osteopenia with degenerative changes of the spine. Total right hip arthroplasty. Similar appearance of L1 compression fracture and retropulsion of the posterior cortex as seen on the prior CT. No new fracture. IMPRESSION: 1. Thickened and inflamed appearance of the second portion of the duodenum with surrounding stranding. Overall decrease in the degree of inflammation compared to prior CT. Underlying mass is not excluded. Further evaluation with endoscopy, as previously recommended and if not yet performed, advised. 2. Contracted and inflamed gallbladder. There is adhesion of the  gallbladder fundus to the right hemidiaphragm. Small pockets of air adjacent to the gallbladder fundus may be within the lumen of the gallbladder or represent focally contained perforation. 3. Moderate size hiatal hernia. 4. Partially visualized small right pleural effusion. 5. Similar appearance of L1 compression fracture and retropulsion of the posterior cortex as seen on the prior CT. 6. Aortic Atherosclerosis (ICD10-I70.0). Electronically Signed   By: Anner Crete M.D.   On: 05/12/2022 00:30    Procedures Procedures    Medications Ordered in ED Medications  piperacillin-tazobactam (ZOSYN) IVPB 3.375 g (has no administration in Adkins range)  iohexol (OMNIPAQUE) 300 MG/ML solution 100 mL (100 mLs Intravenous Contrast Given 05/11/22 2351)    ED Course/ Medical Decision Making/ A&P                           Medical Decision Making Amount and/or Complexity of Data Reviewed Labs: ordered. Radiology: ordered.  Risk Prescription drug management.   This patient [facility] to the ED for concern of abdominal pain, this involves an extensive number of treatment options, and is a complaint that carries with it a high risk of complications and morbidity.  The differential diagnosis includes bowel obstruction, diverticulitis, cholecystitis,  volvulus    Additional history obtained:  Additional history obtained from N/A External records from outside source obtained and reviewed including PCP notes   Co morbidities that complicate the patient evaluation  N/A  Social Determinants of Health:  Geriatric    Lab Tests:  I Ordered, and personally interpreted labs.  The pertinent results include: CBC shows leukocytosis of 13.3, hemoglobin 12.8, BMP shows CO2 of 21 glucose 132 calcium 8.4 T. bili 1.5 lipase 31   Imaging Studies ordered:  I ordered imaging studies including CT abdomen pelvis I independently visualized and interpreted imaging which showed thickening of the duodenum overall better appearing than previous CT scan, she was inflamed gallbladder pocket of air adjacent to the gallbladder unclear if this is in the lumen or contained perforation I agree with the radiologist interpretation   Cardiac Monitoring:  The patient was maintained on a cardiac monitor.  I personally viewed and interpreted the cardiac monitored which showed an underlying rhythm of: N/A   Medicines ordered and prescription drug management:  I ordered medication including Zosyn I have reviewed the patients home medicines and have made adjustments as needed  Critical Interventions:  N/A   Reevaluation:  With stomach pain nausea, will obtain lab work imaging and reassess  Imaging concerning for possible perforation, will start antibiotics, consult with general surgery  Consultations Obtained:  I requested consultation with the general surgeon Dr. Marlou Starks,  and discussed lab and imaging findings as well as pertinent plan - they recommend: Starting antibiotics, admit to medicine, his team will be billed for consultation the morning Spoke with Dr. Hal Hope will admit the patient.    Test Considered:  N/A    Rule out low suspicion for lower lobe pneumonia as lung sounds are clear bilaterally, will defer imaging at this Adkins.   I have low suspicion choledocholithiasis/cholangitis as [name] has no ductal dilation, no elevation in liver enzymes, alk phos, only minimal elevation in T. bili.  Low suspicion for pancreatitis as lipase is within normal limits.  Low suspicion for ruptured stomach ulcer as she has no peritoneal sign present on exam.  Low suspicion for bowel obstruction, volvulus, AAA, pyelo-, kidney stone CT imaging is all negative these findings.  Dispostion and problem  list  After consideration of the diagnostic results and the patients response to treatment, I feel that the patent would benefit from admission.  Abdominal pain-concern for possible cholecystitis with perforation, started on IV antibiotics, patient will need formal consultation by general surgery.            Final Clinical Impression(s) / ED Diagnoses Final diagnoses:  Generalized abdominal pain    Rx / DC Orders ED Discharge Orders     None         Marcello Fennel, PA-C 05/12/22 0155    Quintella Reichert, MD 05/12/22 0222

## 2022-05-12 NOTE — Consult Note (Signed)
Reason for Consult:abd pain Referring Physician: Dr. Ronne Binning is an 88 y.o. male.  HPI: The patient is an 88 year old white male who presents from a Nursing home with reports of nausea and vomiting and abd pain. The patient has dementia and can not give a history. He has no idea where he is or why he is here. He denies pain. Family is not here. From the notes it appears that this has been going on for at least the last month. He has had thickening and inflammation of his duodenum and gallbladder. The patient has been losing weight. Based off the appearance the GI doc suspected this is a duodenal mass that could be malignant. He apparently talked to the family a month ago and recommended palliative care and no further intervention  Past Medical History:  Diagnosis Date   Abnormal MRI, knee    Per [facility] New Patient Packet    Anemia    Anticoagulated    Anxiety    Arthritis    Cervical disc disorder    Per [facility] New Patient Packet    Complication of anesthesia    states "I was [place] several weeks after surgery"caused by tramadol   Constipation    Dysuria 07/28/2016   Edema 07/24/2016   Gait abnormality 07/25/2016   Hard of hearing    Head injury    As a result of a fall, Per [facility] New Patient Packet    Hip arthritis 07/21/2016   History of fall 07/25/2016   Hypercholesteremia    Hypertension    Lumbar spinal stenosis    Memory loss 07/28/2016   mild   Obesity    Per [facility] New Patient Packet    Osteoarthritis of right hip 07/24/2016   Peripheral neuropathy 03/10/2018   Sleep apnea    cpap   Spinal stenosis of lumbar region 05/28/2016   Urinary frequency    Venous thrombosis of leg 07/24/2016   right gastrocnemius   Vertigo    Wears glasses    Weight loss 04/08/2016    Past Surgical History:  Procedure Laterality Date   BACK SURGERY  10/17, 80's   COLONOSCOPY     COLONOSCOPY     Per [facility] New Patient Packet, Dr.Schooler    ESOPHAGOGASTRODUODENOSCOPY      LUMBAR LAMINECTOMY/DECOMPRESSION MICRODISCECTOMY Right 05/28/2016   Procedure: Right Lumbar Three-Four Microdiscectomy;  Surgeon: [name], MD;  Location: [facility];  Service: Neurosurgery;  Laterality: Right;   TOTAL HIP ARTHROPLASTY Right 07/21/2016   Procedure: TOTAL HIP ARTHROPLASTY ANTERIOR APPROACH;  Surgeon: [name], MD;  Location: [place];  Service: Orthopedics;  Laterality: Right;    Family History  Problem Relation Age of Onset   Heart disease Mother    Transient ischemic attack Mother    Kidney failure Father    Alzheimer's disease Sister    AAA (abdominal aortic aneurysm) Sister    Lung cancer Brother    Memory loss Sister    Memory loss Sister    Heart disease Sister    Cancer Sister    Diabetes Sister    Cancer Sister     Social History:  reports that he quit smoking about 53 years ago. His smoking use included cigarettes. He has a 4.00 pack-year smoking history. He has never used smokeless tobacco. He reports that he does not drink alcohol and does not use drugs.  Allergies:  Allergies  Allergen Reactions   Tramadol Other (  See Comments)    "does not eat" LOSS OF APPETITE    Medications: I have reviewed the patient's current medications.  Results for orders placed or performed during the hospital encounter of 05/11/22 (from the past 48 hour(s))  Lipase, blood     Status: None   Collection Time: 05/11/22 10:55 PM  Result Value Ref Range   Lipase 31 11 - 51 U/L    Comment: Performed at [facility], [place] [address], [place], [place] [address]  Comprehensive metabolic panel     Status: Abnormal   Collection Time: 05/11/22 10:55 PM  Result Value Ref Range   Sodium 139 135 - 145 mmol/L   Potassium 4.0 3.5 - 5.1 mmol/L   Chloride 103 98 - 111 mmol/L   CO2 21 (L) 22 - 32 mmol/L   Glucose, Bld 132 (H) 70 - 99 mg/dL    Comment: Glucose reference range applies only to samples taken after fasting for at least 8 hours.   BUN 14 8 -  23 mg/dL   Creatinine, Ser 0.88 0.61 - 1.24 mg/dL   Calcium 8.4 (L) 8.9 - 10.3 mg/dL   Total Protein 6.3 (L) 6.5 - 8.1 g/dL   Albumin 2.8 (L) 3.5 - 5.0 g/dL   AST 26 15 - 41 U/L   ALT 14 0 - 44 U/L   Alkaline Phosphatase 85 38 - 126 U/L   Total Bilirubin 1.5 (H) 0.3 - 1.2 mg/dL   GFR, Estimated >60 >60 mL/min    Comment: (NOTE) Calculated using the CKD-EPI Creatinine Equation (2021)    Anion gap 15 5 - 15    Comment: Performed at [facility], [place] [address] [place], [place] [address]  CBC with Differential     Status: Abnormal   Collection Time: 05/11/22 10:55 PM  Result Value Ref Range   WBC 13.3 (H) 4.0 - 10.5 K/uL   RBC 4.42 4.22 - 5.81 MIL/uL   Hemoglobin 12.8 (L) 13.0 - 17.0 g/dL   HCT 40.1 39.0 - 52.0 %   MCV 90.7 80.0 - 100.0 fL   MCH 29.0 26.0 - 34.0 pg   MCHC 31.9 30.0 - 36.0 g/dL   RDW 17.5 (H) 11.5 - 15.5 %   Platelets 250 150 - 400 K/uL   nRBC 0.0 0.0 - 0.2 %   Neutrophils Relative % 86 %   Neutro Abs 11.5 (H) 1.7 - 7.7 K/uL   Lymphocytes Relative 5 %   Lymphs Abs 0.7 0.7 - 4.0 K/uL   Monocytes Relative 3 %   Monocytes Absolute 0.4 0.1 - 1.0 K/uL   Eosinophils Relative 0 %   Eosinophils Absolute 0.0 0.0 - 0.5 K/uL   Basophils Relative 1 %   Basophils Absolute 0.1 0.0 - 0.1 K/uL   Immature Granulocytes 5 %   Abs Immature Granulocytes 0.66 (H) 0.00 - 0.07 K/uL    Comment: Performed at [facility], [place] [address], [place], [place] [address]  Blood culture (routine x 2)     Status: None (Preliminary result)   Collection Time: 05/12/22  2:00 AM   Specimen: BLOOD  Result Value Ref Range   Specimen Description      BLOOD BLOOD RIGHT WRIST Performed at [place] [address], [place], [place] [address]    Special Requests      IN PEDIATRIC BOTTLE Blood Culture adequate volume Performed at [place] [address], [place],  [address]  Culture PENDING    Report Status PENDING    Basic metabolic panel     Status: Abnormal   Collection Time: 05/12/22  4:44 AM  Result Value Ref Range   Sodium 139 135 - 145 mmol/L   Potassium 3.7 3.5 - 5.1 mmol/L   Chloride 103 98 - 111 mmol/L   CO2 28 22 - 32 mmol/L   Glucose, Bld 126 (H) 70 - 99 mg/dL    Comment: Glucose reference range applies only to samples taken after fasting for at least 8 hours.   BUN 14 8 - 23 mg/dL   Creatinine, Ser 0.88 0.61 - 1.24 mg/dL   Calcium 8.7 (L) 8.9 - 10.3 mg/dL   GFR, Estimated >60 >60 mL/min    Comment: (NOTE) Calculated using the CKD-EPI Creatinine Equation (2021)    Anion gap 8 5 - 15    Comment: Performed at [facility], [place] [address], [place], [place] [address]  Hepatic function panel     Status: Abnormal   Collection Time: 05/12/22  4:44 AM  Result Value Ref Range   Total Protein 6.2 (L) 6.5 - 8.1 g/dL   Albumin 2.8 (L) 3.5 - 5.0 g/dL   AST 21 15 - 41 U/L   ALT 16 0 - 44 U/L   Alkaline Phosphatase 80 38 - 126 U/L   Total Bilirubin 1.3 (H) 0.3 - 1.2 mg/dL   Bilirubin, Direct 0.3 (H) 0.0 - 0.2 mg/dL   Indirect Bilirubin 1.0 (H) 0.3 - 0.9 mg/dL    Comment: Performed at [facility], [place] [address], [place], [place] [address]  CBC with Differential/Platelet     Status: Abnormal   Collection Time: 05/12/22  4:44 AM  Result Value Ref Range   WBC 10.0 4.0 - 10.5 K/uL   RBC 4.29 4.22 - 5.81 MIL/uL   Hemoglobin 12.2 (L) 13.0 - 17.0 g/dL   HCT 38.3 (L) 39.0 - 52.0 %   MCV 89.3 80.0 - 100.0 fL   MCH 28.4 26.0 - 34.0 pg   MCHC 31.9 30.0 - 36.0 g/dL   RDW 17.6 (H) 11.5 - 15.5 %   Platelets 263 150 - 400 K/uL   nRBC 0.0 0.0 - 0.2 %   Neutrophils Relative % 83 %   Neutro Abs 8.4 (H) 1.7 - 7.7 K/uL   Lymphocytes Relative 10 %   Lymphs Abs 1.0 0.7 - 4.0 K/uL   Monocytes Relative 3 %   Monocytes Absolute 0.3 0.1 - 1.0 K/uL   Eosinophils Relative 0 %   Eosinophils Absolute 0.0 0.0 - 0.5 K/uL   Basophils Relative 1 %   Basophils Absolute 0.1  0.0 - 0.1 K/uL   Immature Granulocytes 3 %   Abs Immature Granulocytes 0.27 (H) 0.00 - 0.07 K/uL    Comment: Performed at [facility], [place] [address] [place], [place] [address]  Glucose, capillary     Status: Abnormal   Collection Time: 05/12/22  5:46 AM  Result Value Ref Range   Glucose-Capillary 129 (H) 70 - 99 mg/dL    Comment: Glucose reference range applies only to samples taken after fasting for at least 8 hours.    CT Abdomen Pelvis W Contrast  Result Date: 05/12/2022 CLINICAL DATA:  Abdominal pain. EXAM: CT ABDOMEN AND PELVIS WITH CONTRAST TECHNIQUE: Multidetector CT imaging of the abdomen and pelvis was performed using the standard protocol following bolus administration of intravenous contrast. RADIATION DOSE REDUCTION: This exam was performed according  to the departmental dose-optimization program which includes automated exposure control, adjustment of the mA and/or kV according to patient size and/or use of iterative reconstruction technique. CONTRAST:  194m OMNIPAQUE IOHEXOL 300 MG/ML  SOLN COMPARISON:  CT abdomen pelvis dated 04/11/2022. FINDINGS: Lower chest: Partially visualized small right pleural effusion. There is a small left lung base calcified granuloma. There is advanced coronary vascular calcification. No intra-abdominal free air or free fluid. Hepatobiliary: The liver is unremarkable. There is mild biliary ductal dilatation or periportal edema. The gallbladder is contracted and appears inflamed. The gallbladder extends superiorly to the level of the diaphragm. There is fusion of the gallbladder fundus to the right diaphragm. Small pockets of air adjacent to the gallbladder fundus may be within the lumen of the gallbladder or represent focally contained perforation. Pancreas: No acute findings.  No inflammatory changes. Spleen: Normal in size without focal abnormality. Adrenals/Urinary Tract: The adrenal glands unremarkable several left renal cysts. There is  no hydronephrosis on either side. There is symmetric enhancement and excretion of contrast by both kidneys. The visualized ureters and urinary bladder appear unremarkable. Stomach/Bowel: There is a moderate size hiatal hernia. Thickened and inflamed appearance of the second portion of the duodenum with surrounding stranding. Overall decrease in the degree of inflammation compared to prior CT. Underlying mass is not excluded. Further evaluation with endoscopy, as previously recommended and if not yet performed, advised. There is focal area of thickening of the medial wall of the distal ascending colon (25/3 and coronal 68/5), likely reactive to inflammatory changes of the duodenum. There is moderate amount of stool throughout the colon. There is no bowel obstruction. The appendix is normal. Vascular/Lymphatic: Moderate aortoiliac atherosclerotic disease. The IVC is unremarkable. No portal venous gas. There is no adenopathy. Reproductive: The prostate and seminal vesicles are grossly remarkable. Other: None Musculoskeletal: Osteopenia with degenerative changes of the spine. Total right hip arthroplasty. Similar appearance of L1 compression fracture and retropulsion of the posterior cortex as seen on the prior CT. No new fracture. IMPRESSION: 1. Thickened and inflamed appearance of the second portion of the duodenum with surrounding stranding. Overall decrease in the degree of inflammation compared to prior CT. Underlying mass is not excluded. Further evaluation with endoscopy, as previously recommended and if not yet performed, advised. 2. Contracted and inflamed gallbladder. There is adhesion of the gallbladder fundus to the right hemidiaphragm. Small pockets of air adjacent to the gallbladder fundus may be within the lumen of the gallbladder or represent focally contained perforation. 3. Moderate size hiatal hernia. 4. Partially visualized small right pleural effusion. 5. Similar appearance of L1 compression  fracture and retropulsion of the posterior cortex as seen on the prior CT. 6. Aortic Atherosclerosis (ICD10-I70.0). Electronically Signed   By: AAnner CreteM.D.   On: 05/12/2022 00:30    Review of Systems  Constitutional:  Positive for unexpected weight change.  HENT: Negative.    Eyes: Negative.   Respiratory: Negative.    Cardiovascular: Negative.   Gastrointestinal:  Positive for abdominal pain.  Endocrine: Negative.   Genitourinary: Negative.   Musculoskeletal: Negative.   Skin: Negative.   Allergic/Immunologic: Negative.   Neurological: Negative.   Hematological: Negative.   Psychiatric/Behavioral:  Positive for confusion.    Blood pressure 113/73, pulse 86, temperature 97.9 F (36.6 C), resp. rate 20, SpO2 92 %. Physical Exam Vitals reviewed.  Constitutional:      General: He is not in acute distress.    Comments: Elderly and frail  HENT:  Head: Normocephalic and atraumatic.     Right Ear: External ear normal.     Left Ear: External ear normal.     Nose: Nose normal.     Mouth/Throat:     Mouth: Mucous membranes are dry.     Pharynx: Oropharynx is clear.  Eyes:     Extraocular Movements: Extraocular movements intact.     Conjunctiva/sclera: Conjunctivae normal.     Pupils: Pupils are equal, round, and reactive to light.  Cardiovascular:     Rate and Rhythm: Normal rate and regular rhythm.     Pulses: Normal pulses.     Heart sounds: Normal heart sounds.  Abdominal:     General: Abdomen is flat.     Palpations: Abdomen is soft.     Comments: There is some fullness in the RUQ  Musculoskeletal:        General: No swelling or deformity. Normal range of motion.     Cervical back: Normal range of motion and neck supple.  Skin:    General: Skin is warm and dry.     Coloration: Skin is not jaundiced.  Neurological:     General: No focal deficit present.     Mental Status: He is alert. He is disoriented.  Psychiatric:        Mood and Affect: Mood normal.         Behavior: Behavior normal.     Assessment/Plan: The patient appears to have inflammation of gallbladder as well as of the duodenum. With his age, dementia, and other medical issues he would certainly be at high risk of complication with surgery. He might be a candidate for perc drain but with his dementia we would worry about him pulling the drain out. With his weight loss and duodenal findings, GI was concerned for duodenal malignancy. They recommended palliative care which I feel would be appropriate. Will need to discuss with family how aggressive they want [place] to be. Will follow  Autumn Messing III 05/12/2022, 7:49 AM

## 2022-05-12 NOTE — Progress Notes (Signed)
Pharmacy Antibiotic Note  [name] is a 88 y.o. male admitted on 05/11/2022 with intra-abdominal infection (possible cholecystitis with perforation).  Pharmacy has been consulted for Zosyn dosing.  Plan: Zosyn 3.375g IV q8h (4 hour infusion). Need for further dosage adjustment appears unlikely at present.    Will sign off at this time.  Please reconsult if a change in clinical status warrants re-evaluation of dosage.      Temp (24hrs), Avg:97.9 F (36.6 C), Min:97.8 F (36.6 C), Max:98 F (36.7 C)  Recent Labs  Lab 05/11/22 2255  WBC 13.3*  CREATININE 0.88    Estimated Creatinine Clearance: 58 mL/min (by C-G formula based on SCr of 0.88 mg/dL).    Allergies  Allergen Reactions   Tramadol Other (See Comments)    "does not eat" LOSS OF APPETITE      Thank you for allowing pharmacy to be a part of this patient's care.  Everette Rank, PharmD 05/12/2022 4:15 AM

## 2022-05-13 DIAGNOSIS — R109 Unspecified abdominal pain: Secondary | ICD-10-CM | POA: Diagnosis not present

## 2022-05-13 LAB — BLOOD CULTURE ID PANEL (REFLEXED) - BCID2

## 2022-05-13 MED ORDER — MORPHINE SULFATE 10 MG/5ML PO SOLN: 2.5000 mg | ORAL | 0 refills | Status: DC | PRN

## 2022-05-13 NOTE — Progress Notes (Signed)
Verbal report given to Tamika @ [place]. AVS printed and placed in discharge packet. Patient care has been provided. Patient is clean and ready for transport.

## 2022-05-13 NOTE — Progress Notes (Signed)
Patient is resting comfortably this morning. Noted plans to transition to hospice/ comfort care.  We will sign off, please call with questions or concerns.  Wellington Hampshire, [facility] 05/13/2022, 8:58 AM Please see Amion for pager number during day hours 7:00am-4:30pm

## 2022-05-13 NOTE — Progress Notes (Signed)
PHARMACY - PHYSICIAN COMMUNICATION CRITICAL VALUE ALERT - BLOOD CULTURE IDENTIFICATION (BCID)  [name] is an 88 y.o. male who presented to [facility] on 05/11/2022 with a chief complaint of abdominal pain. Found to have cholecystitis with possible perforation as well as duodenal findings concerning for malignancy, but is not a surgical candidate d/t dementia.  Assessment:  1/4 BCx bottles growing staph spp (suspect contam given presentation and low association with GI pathology)  Name of physician (or Provider) Contacted: [place] Pink, G  Current antibiotics: Zosyn  Changes to prescribed antibiotics recommended:  Patient is on recommended antibiotics - No changes needed  Results for orders placed or performed during the hospital encounter of 05/11/22  Blood Culture ID Panel (Reflexed) (Collected: 05/12/2022  2:00 AM)  Result Value Ref Range   Enterococcus faecalis NOT DETECTED NOT DETECTED   Enterococcus Faecium NOT DETECTED NOT DETECTED   Listeria monocytogenes NOT DETECTED NOT DETECTED   Staphylococcus species DETECTED (A) NOT DETECTED   Staphylococcus aureus (BCID) NOT DETECTED NOT DETECTED   Staphylococcus epidermidis NOT DETECTED NOT DETECTED   Staphylococcus lugdunensis NOT DETECTED NOT DETECTED   Streptococcus species NOT DETECTED NOT DETECTED   Streptococcus agalactiae NOT DETECTED NOT DETECTED   Streptococcus pneumoniae NOT DETECTED NOT DETECTED   Streptococcus pyogenes NOT DETECTED NOT DETECTED   A.calcoaceticus-baumannii NOT DETECTED NOT DETECTED   Bacteroides fragilis NOT DETECTED NOT DETECTED   Enterobacterales NOT DETECTED NOT DETECTED   Enterobacter cloacae complex NOT DETECTED NOT DETECTED   Escherichia coli NOT DETECTED NOT DETECTED   Klebsiella aerogenes NOT DETECTED NOT DETECTED   Klebsiella oxytoca NOT DETECTED NOT DETECTED   Klebsiella pneumoniae NOT DETECTED NOT DETECTED   Proteus species NOT DETECTED NOT DETECTED   Salmonella species NOT DETECTED NOT  DETECTED   Serratia marcescens NOT DETECTED NOT DETECTED   Haemophilus influenzae NOT DETECTED NOT DETECTED   Neisseria meningitidis NOT DETECTED NOT DETECTED   Pseudomonas aeruginosa NOT DETECTED NOT DETECTED   Stenotrophomonas maltophilia NOT DETECTED NOT DETECTED   Candida albicans NOT DETECTED NOT DETECTED   Candida auris NOT DETECTED NOT DETECTED   Candida glabrata NOT DETECTED NOT DETECTED   Candida krusei NOT DETECTED NOT DETECTED   Candida parapsilosis NOT DETECTED NOT DETECTED   Candida tropicalis NOT DETECTED NOT DETECTED   Cryptococcus neoformans/gattii NOT DETECTED NOT DETECTED    [name] 05/13/2022  8:55 AM

## 2022-05-13 NOTE — Progress Notes (Signed)
Upon entering room, noted patient is visibly agitated and is repeatedly calling for "[name]".  Patient has removed gown, PIV, and male external catheter and tossed them on the floor. Patient care was provided including bath and linen change by Probation officer and another Therapist, sports. Male external catheter was replaced.HOB slightly elevated. Call bell within reach and bed alarm set to middle setting. Bed in lowest position. Multiple offers were extended to patient for nourishment and hydration. Patient declined all offers and closed mouth tightly. Will continue to monitor for safety.

## 2022-05-13 NOTE — Progress Notes (Signed)
Patient is verbally moaning. When asked about pain, patient stated he is hurting all over. Attempted to administer oral Oxycodone to patient but he refused PO intake and closed mouth tightly. Lunch tray is delivered to room. Offered patient nourishment and hydration. Patient declined offer and closed mouth tightly.

## 2022-05-13 NOTE — TOC Transition Note (Signed)
Transition of Care [facility]) - CM/SW Discharge Note   Patient Details  Name: OMER PUCCINELLI MRN: [id number] Date of Birth: Sep 27, 1930  [facility] of [facility] CM/SW Contact:  Vassie Moselle, LCSW Phone Number: 05/13/2022, 12:37 PM   Clinical Narrative:    Pt is to transfer back to [facility]. Pt will be going to room 16. RN to call report to [phone] ext. [phone]. Pt's son and spouse aware and agreeable to pt's discharge. Pt will be transported to facility via [facility].    Final next level of care: Skilled Nursing Facility Barriers to Discharge: Barriers Resolved   Patient Goals and CMS Choice Patient states their goals for this hospitalization and ongoing recovery are:: receive hospice services   Choice offered to / list presented to : Spouse, Adult Children  Discharge Placement              Patient chooses bed at: [place] Patient to be transferred to facility by: [place] Name of family member notified: Son/POA Charlett Nose Patient and family notified of of transfer: 05/13/22  Discharge Plan and Services In-house Referral: NA Discharge Planning Services: CM Consult Post Acute Care Choice: NA          DME Arranged: N/A DME Agency: NA       HH Arranged: NA HH Agency: NA        Social Determinants of Health (SDOH) Interventions     Readmission Risk Interventions     No data to display

## 2022-05-13 NOTE — Plan of Care (Signed)

## 2022-05-13 NOTE — Discharge Summary (Signed)
[facility]  Physician Discharge Summary   Patient ID: [name] MRN: [id number] DOB/AGE: Jun 12, 1931 88 y.o.  Admit date: 05/11/2022 Discharge date:   05/13/2022   PCP: Mast, Man X, NP  DISCHARGE DIAGNOSES:  Principal Problem:   Abdominal pain Active Problems:   Hypothyroidism   Hospice care patient   RECOMMENDATIONS FOR OUTPATIENT FOLLOW UP: Patient referred to hospice.  Goal is to minimize hospitalization.  Transition to comfort care if he declines.   Home Health: Going to SNF Equipment/Devices: None  CODE STATUS: DNR  DISCHARGE CONDITION: fair  Diet recommendation: Can start off with full liquids and then if tolerated could try dysphagia 1 or 2 diet.  INITIAL HISTORY: 88 y.o. male with history of dementia, hyperlipidemia, hypothyroidism was brought to the ER after patient had an episode of nausea vomiting.  Reviewing patient's chart patient has been recently following with gastroenterologist since patient has been a poor appetite and abdominal discomfort.  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  Patient was sent over due to nausea vomiting.  CT scan again raise concern for duodenal inflammation but also showed possible gallbladder inflammation and perforation.  Patient was hospitalized for further management.   Consultants: General surgery   Procedures: None    HOSPITAL COURSE:   Nausea vomiting with abnormal appearing gallbladder/concern for duodenal inflammation versus neoplasm Concern was for cholecystitis and gallbladder perforation.  Patient started on Zosyn. Abdomen was benign on examination. Patient has been seen by general surgery.  No clear role for surgical intervention.  No role for percutaneous drainage which could also be technically challenging due to anomalous positioning of the gallbladder.   Concern was also raised regarding the findings noted in the duodenum. These findings have been evaluated by gastroenterology.   Discussed with patient's son who mentioned that Dr. Loletha Carrow with gastroenterology feels that the findings are strongly suggestive of malignancy considering patient's significant weight loss.  However due to his dementia he is not a good candidate for any invasive procedures or interventions.  Taking into consideration all of the above his life expectancy is thought to be less than 6 months. The son and other family members including patient's wife and the other son agree.  They are agreeable to transition to hospice/comfort care. 1 out of 4 bottles growing staph.  Likely contaminant as the patient is afebrile. Will not be discharged on any antibiotics.  Goal will be to focus mainly on comfort.  We will give prescription for oxycodone for moderate pain and morphine solution for severe pain.  Hypothyroidism   History of dementia Very confused which apparently is his baseline.   Continue with home medication regimen  Goals of care See above.  Transition to hospice/comfort care.  All nonessential medications have been discontinued.     Patient is stable.  Okay for discharge back to SNF when hospice has been established.   PERTINENT LABS:  The results of significant diagnostics from this hospitalization (including imaging, microbiology, ancillary and laboratory) are listed below for reference.    Microbiology: Recent Results (from the past 240 hour(s))  Blood culture (routine x 2)     Status: None (Preliminary result)   Collection Time: 05/12/22  2:00 AM   Specimen: BLOOD  Result Value Ref Range Status   Specimen Description   Final    BLOOD BLOOD RIGHT WRIST Performed at [facility] [address], [place],  [address]    Special Requests IN PEDIATRIC BOTTLE Blood Culture adequate volume  Final   Culture  Setup Time   Final    GRAM POSITIVE COCCI IN CLUSTERS IN PEDIATRIC BOTTLE CRITICAL RESULT CALLED TO, READ BACK BY AND VERIFIED WITH: [place]  [id number] AT 9030 BY EC Performed at [facility], [place] [address], [place], [place] [address]    Culture GRAM POSITIVE COCCI  Final   Report Status PENDING  Incomplete  Blood Culture ID Panel (Reflexed)     Status: Abnormal   Collection Time: 05/12/22  2:00 AM  Result Value Ref Range Status   Enterococcus faecalis NOT DETECTED NOT DETECTED Final   Enterococcus Faecium NOT DETECTED NOT DETECTED Final   Listeria monocytogenes NOT DETECTED NOT DETECTED Final   Staphylococcus species DETECTED (A) NOT DETECTED Final    Comment: CRITICAL RESULT CALLED TO, READ BACK BY AND VERIFIED WITH: PHARMD MARY SWAYNE [id number] T 0826 BY EC    Staphylococcus aureus (BCID) NOT DETECTED NOT DETECTED Final   Staphylococcus epidermidis NOT DETECTED NOT DETECTED Final   Staphylococcus lugdunensis NOT DETECTED NOT DETECTED Final   Streptococcus species NOT DETECTED NOT DETECTED Final   Streptococcus agalactiae NOT DETECTED NOT DETECTED Final   Streptococcus pneumoniae NOT DETECTED NOT DETECTED Final   Streptococcus pyogenes NOT DETECTED NOT DETECTED Final   A.calcoaceticus-baumannii NOT DETECTED NOT DETECTED Final   Bacteroides fragilis NOT DETECTED NOT DETECTED Final   Enterobacterales NOT DETECTED NOT DETECTED Final   Enterobacter cloacae complex NOT DETECTED NOT DETECTED Final   Escherichia coli NOT DETECTED NOT DETECTED Final   Klebsiella aerogenes NOT DETECTED NOT DETECTED Final   Klebsiella oxytoca NOT DETECTED NOT DETECTED Final   Klebsiella pneumoniae NOT DETECTED NOT DETECTED Final   Proteus species NOT DETECTED NOT DETECTED Final   Salmonella species NOT DETECTED NOT DETECTED Final   Serratia marcescens NOT DETECTED NOT DETECTED Final   Haemophilus influenzae NOT DETECTED NOT DETECTED Final   Neisseria meningitidis NOT DETECTED NOT DETECTED Final   Pseudomonas aeruginosa NOT DETECTED NOT DETECTED Final   Stenotrophomonas maltophilia NOT DETECTED NOT DETECTED Final   Candida albicans NOT  DETECTED NOT DETECTED Final   Candida auris NOT DETECTED NOT DETECTED Final   Candida glabrata NOT DETECTED NOT DETECTED Final   Candida krusei NOT DETECTED NOT DETECTED Final   Candida parapsilosis NOT DETECTED NOT DETECTED Final   Candida tropicalis NOT DETECTED NOT DETECTED Final   Cryptococcus neoformans/gattii NOT DETECTED NOT DETECTED Final    Comment: Performed at [facility], [address], [place], [place] [address]  Blood culture (routine x 2)     Status: None (Preliminary result)   Collection Time: 05/12/22  4:44 AM   Specimen: BLOOD  Result Value Ref Range Status   Specimen Description   Final    BLOOD SITE NOT SPECIFIED Performed at [facility] [address], [place], [place] [address]    Special Requests   Final    BOTTLES DRAWN AEROBIC AND ANAEROBIC Blood Culture adequate volume Performed at [facility] [address] [place], [place] [address]    Culture   Final    NO GROWTH < 24 HOURS Performed at [facility] [address], [place], [place] [address]    Report Status PENDING  Incomplete     Labs:   Basic Metabolic Panel: Recent Labs  Lab 05/11/22 2255 05/12/22 0444  NA 139 139  K 4.0 3.7  CL 103 103  CO2 21* 28  GLUCOSE 132* 126*  BUN 14 14  CREATININE 0.88 0.88  CALCIUM 8.4* 8.7*   Liver Function Tests: Recent Labs  Lab 05/11/22 2255 05/12/22 0444  AST 26 21  ALT 14 16  ALKPHOS 85 80  BILITOT 1.5* 1.3*  PROT 6.3* 6.2*  ALBUMIN 2.8* 2.8*   Recent Labs  Lab 05/11/22 2255  LIPASE 31    CBC: Recent Labs  Lab 05/11/22 2255 05/12/22 0444  WBC 13.3* 10.0  NEUTROABS 11.5* 8.4*  HGB 12.8* 12.2*  HCT 40.1 38.3*  MCV 90.7 89.3  PLT 250 263     CBG: Recent Labs  Lab 05/12/22 0546  GLUCAP 129*     IMAGING STUDIES CT Abdomen Pelvis W Contrast  Result Date: 05/12/2022 CLINICAL DATA:  Abdominal pain. EXAM: CT ABDOMEN AND PELVIS WITH CONTRAST TECHNIQUE: Multidetector CT  imaging of the abdomen and pelvis was performed using the standard protocol following bolus administration of intravenous contrast. RADIATION DOSE REDUCTION: This exam was performed according to the departmental dose-optimization program which includes automated exposure control, adjustment of the mA and/or kV according to patient size and/or use of iterative reconstruction technique. CONTRAST:  183m OMNIPAQUE IOHEXOL 300 MG/ML  SOLN COMPARISON:  CT abdomen pelvis dated 04/11/2022. FINDINGS: Lower chest: Partially visualized small right pleural effusion. There is a small left lung base calcified granuloma. There is advanced coronary vascular calcification. No intra-abdominal free air or free fluid. Hepatobiliary: The liver is unremarkable. There is mild biliary ductal dilatation or periportal edema. The gallbladder is contracted and appears inflamed. The gallbladder extends superiorly to the level of the diaphragm. There is fusion of the gallbladder fundus to the right diaphragm. Small pockets of air adjacent to the gallbladder fundus may be within the lumen of the gallbladder or represent focally contained perforation. Pancreas: No acute findings.  No inflammatory changes. Spleen: Normal in size without focal abnormality. Adrenals/Urinary Tract: The adrenal glands unremarkable several left renal cysts. There is no hydronephrosis on either side. There is symmetric enhancement and excretion of contrast by both kidneys. The visualized ureters and urinary bladder appear unremarkable. Stomach/Bowel: There is a moderate size hiatal hernia. Thickened and inflamed appearance of the second portion of the duodenum with surrounding stranding. Overall decrease in the degree of inflammation compared to prior CT. Underlying mass is not excluded. Further evaluation with endoscopy, as previously recommended and if not yet performed, advised. There is focal area of thickening of the medial wall of the distal ascending colon (25/3  and coronal 68/5), likely reactive to inflammatory changes of the duodenum. There is moderate amount of stool throughout the colon. There is no bowel obstruction. The appendix is normal. Vascular/Lymphatic: Moderate aortoiliac atherosclerotic disease. The IVC is unremarkable. No portal venous gas. There is no adenopathy. Reproductive: The prostate and seminal vesicles are grossly remarkable. Other: None Musculoskeletal: Osteopenia with degenerative changes of the spine. Total right hip arthroplasty. Similar appearance of L1 compression fracture and retropulsion of the posterior cortex as seen on the prior CT. No new fracture. IMPRESSION: 1. Thickened and inflamed appearance of the second portion of the duodenum with surrounding stranding. Overall decrease in the degree of inflammation compared to prior CT. Underlying mass is not excluded. Further evaluation with endoscopy, as previously recommended and if not yet performed, advised. 2. Contracted and inflamed gallbladder. There is adhesion of the gallbladder fundus to the right hemidiaphragm. Small pockets of air adjacent to the gallbladder fundus may be within the lumen of the gallbladder or represent focally contained perforation. 3. Moderate size hiatal hernia.  4. Partially visualized small right pleural effusion. 5. Similar appearance of L1 compression fracture and retropulsion of the posterior cortex as seen on the prior CT. 6. Aortic Atherosclerosis (ICD10-I70.0). Electronically Signed   By: Anner Crete M.D.   On: 05/12/2022 00:30    DISCHARGE EXAMINATION: Vitals:   05/12/22 0209 05/12/22 0316 05/12/22 0749 05/13/22 0459  BP:  113/73 107/62 116/76  Pulse:  86 69 66  Resp:  '20 18 18  '$ Temp: 97.8 F (36.6 C) 97.9 F (36.6 C) (!) 97.4 F (36.3 C) 97.8 F (36.6 C)  TempSrc: Axillary  Oral   SpO2:  92% 97%    General appearance: Awake alert.  In no distress.  Confused and distracted. Resp: Clear to auscultation bilaterally.  Normal  effort Cardio: S1-S2 is normal regular.  No S3-S4.  No rubs murmurs or bruit GI: Abdomen is soft.  Nontender nondistended.  Bowel sounds are present normal.  No masses organomegaly   DISPOSITION: SNF     Allergies as of 05/13/2022       Reactions   Tramadol Other (See Comments)   "does not eat" LOSS OF APPETITE        Medication List     STOP taking these medications    aspirin EC 81 MG tablet   atorvastatin 20 MG tablet Commonly known as: LIPITOR   B-12 1000 MCG Tabs   CoQ10 100 MG Caps   furosemide 20 MG tablet Commonly known as: LASIX   levothyroxine 75 MCG tablet Commonly known as: SYNTHROID   omeprazole 20 MG capsule Commonly known as: PRILOSEC   potassium chloride SA 20 MEQ tablet Commonly known as: KLOR-CON M       TAKE these medications    acetaminophen 500 MG tablet Commonly known as: TYLENOL Take 1,000 mg by mouth daily as needed for mild pain.   Biofreeze 4 % Gel Generic drug: Menthol (Topical Analgesic) Apply 1 Application topically in the morning, at noon, in the evening, and at bedtime. Apply to right shoulder   DYNADERM HYDROCOLLOID 4"X4" EX Apply topically every 3 (three) days. Cleanse dime-sized area on right buttocks with NSS, pat dry, apply 2X2 dressing, change every 3 days.   fexofenadine 180 MG tablet Commonly known as: ALLEGRA Take 180 mg by mouth daily.   fluticasone 50 MCG/ACT nasal spray Commonly known as: FLONASE Place 2 sprays into both nostrils daily.   gabapentin 100 MG capsule Commonly known as: NEURONTIN Take 200 mg by mouth at bedtime. 8pm.   hydrocortisone 2.5 % cream Apply 1 Application topically 2 (two) times daily as needed (for facial rash).   hydrOXYzine 10 MG tablet Commonly known as: ATARAX Take 10 mg by mouth 2 (two) times daily as needed (for agitation).   meclizine 25 MG tablet Commonly known as: ANTIVERT Take 25 mg by mouth 3 (three) times daily as needed for dizziness.   mirtazapine 7.5 MG  tablet Commonly known as: REMERON Take 7.5 mg by mouth at bedtime.   morphine 10 MG/5ML solution Take 1.3 mLs (2.6 mg total) by mouth every 2 (two) hours as needed for severe pain.   NAT-RUL PSYLLIUM SEED HUSKS PO Take 1 tablet by mouth daily. For constipation   nystatin powder Commonly known as: MYCOSTATIN/NYSTOP Apply 1 application  topically See admin instructions. Apply to periwound and groin every other day as needed   oxyCODONE 5 MG immediate release tablet Commonly known as: Oxy IR/ROXICODONE Take 1 tablet (5 mg total) by mouth every 4 (four) hours  as needed for moderate pain.   polyethylene glycol 17 g packet Commonly known as: MIRALAX / GLYCOLAX Take 17 g by mouth daily.   promethazine 25 MG suppository Commonly known as: PHENERGAN Place 25 mg rectally every 6 (six) hours as needed for nausea or vomiting.   QUEtiapine 25 MG tablet Commonly known as: SEROQUEL Take 25 mg by mouth 2 (two) times daily.   Resource 2.0 Liqd Take 180 mLs by mouth in the morning and at bedtime.   sennosides-docusate sodium 8.6-50 MG tablet Commonly known as: SENOKOT-S Take 2 tablets by mouth daily.   sertraline 25 MG tablet Commonly known as: ZOLOFT Take 1 tablet (25 mg total) by mouth daily. What changed: how much to take   tamsulosin 0.4 MG Caps capsule Commonly known as: FLOMAX Take 0.4 mg by mouth at bedtime.           TOTAL DISCHARGE TIME: 35 minutes  Jaena Brocato [employer] on [email or web address]  05/13/2022, 8:54 AM

## 2022-05-14 ENCOUNTER — Non-Acute Institutional Stay (SKILLED_NURSING_FACILITY): Payer: Medicare PPO | Admitting: Adult Health

## 2022-05-14 ENCOUNTER — Encounter: Payer: Self-pay | Admitting: Adult Health

## 2022-05-14 DIAGNOSIS — R630 Anorexia: Secondary | ICD-10-CM | POA: Diagnosis not present

## 2022-05-14 DIAGNOSIS — G609 Hereditary and idiopathic neuropathy, unspecified: Secondary | ICD-10-CM | POA: Diagnosis not present

## 2022-05-14 DIAGNOSIS — R935 Abnormal findings on diagnostic imaging of other abdominal regions, including retroperitoneum: Secondary | ICD-10-CM | POA: Diagnosis not present

## 2022-05-14 DIAGNOSIS — R112 Nausea with vomiting, unspecified: Secondary | ICD-10-CM | POA: Diagnosis not present

## 2022-05-14 DIAGNOSIS — F01C3 Vascular dementia, severe, with mood disturbance: Secondary | ICD-10-CM | POA: Diagnosis not present

## 2022-05-14 NOTE — Progress Notes (Signed)
Location:  [name] Room Number: NO/16/A Place of Service:  SNF (31) Provider:  Durenda Age, DNP, FNP-BC  Patient Care Team: Mast, Man X, NP as PCP - General (Internal Medicine) [name], [name], MD as Consulting Physician (Orthopedic Surgery) [name], MD as Consulting Physician (Gastroenterology) System, Provider Not In [name], MD as Consulting Physician (Neurosurgery) Travis Halsted, MD as Referring Physician (Ophthalmology) Mast, Man X, NP as Nurse Practitioner (Internal Medicine) Almedia Balls, MD as Referring Physician (Orthopedic Surgery) [name], MD as Consulting Physician (Neurosurgery) Travis Saas, MD as Consulting Physician (Orthopedic Surgery) Travis Kenner, MD (Dermatology) Kathrynn Ducking, MD (Inactive) as Consulting Physician (Neurology) Franchot Gallo, MD as Consulting Physician (Urology)  Extended Emergency Contact Information Primary Emergency Contact: [name] Address: [address]          [place], [place] [address] [place] Phone: [phone] Relation: Spouse Secondary Emergency Contact: [place] Mobile Phone: [phone] Relation: Relative  Code Status:  DNR  Goals of care: Advanced Directive information    05/16/2022   11:33 AM  Advanced Directives  Does Patient Have a Medical Advance Directive? Yes  Type of Paramedic of [place];Living will;Out of facility DNR (pink MOST or yellow form)  Does patient want to make changes to medical advance directive? No - Patient declined  Copy of [place] in Chart? Yes - validated most recent copy scanned in chart (See row information)  Pre-existing out of facility DNR order (yellow form or pink MOST form) Yellow form placed in chart (order not valid for inpatient use)     Chief Complaint  Patient presents with   Hospitalization Follow-up     Follow up after hospital stay     HPI:  Pt is a 88 y.o. male seen today for follow up of hospitalization. He has a PMH of dementia, hyperlipidemia and hypothyroidism. He was recently hospitalized 05/11/22 to 05/13/22. He was sent to the hospital due to episode of nausea and vomiting. He recently followed with GI and recent CT scans raised concern for duodenal neoplasm. Repeat CT scan done on 05/11/22 again raised concern for due to bowel inflammation but also showed possible gallbladder inflammation and perforation.  He has been losing weight.  GI suspected that this is a duodenal mass that could be malignant and discussed with family.  He is not a good candidate for any invasive procedures or interventions due to dementia.  It was thought that his life expectancy is to be less than 6 months.  Family has agreed to transition to hospice/comfort care.  He was seen in the room today with wife at bedside. He has his eyes closed and denies pain. Staff reported that he yells for wife "[name]" whenever she leaves his room. Wife visits everyday.    Past Medical History:  Diagnosis Date   Abnormal MRI, knee    Per [facility] New Patient Packet    Anemia    Anticoagulated    Anxiety    Arthritis    Cervical disc disorder    Per [facility] New Patient Packet    Complication of anesthesia    states "I was in [place] several weeks after surgery"caused by tramadol   Constipation    Dysuria 07/28/2016   Edema 07/24/2016   Gait abnormality 07/25/2016   Hard of hearing    Head injury    As  a result of a fall, Per [facility] New Patient Packet    Hip arthritis 07/21/2016   History of fall 07/25/2016   Hypercholesteremia    Hypertension    Lumbar spinal stenosis    Memory loss 07/28/2016   mild   Obesity    Per [facility] New Patient Packet    Osteoarthritis of right hip 07/24/2016   Peripheral neuropathy 03/10/2018   Sleep apnea    cpap   Spinal stenosis of lumbar region 05/28/2016   Urinary frequency    Venous thrombosis  of leg 07/24/2016   right gastrocnemius   Vertigo    Wears glasses    Weight loss 04/08/2016   Past Surgical History:  Procedure Laterality Date   BACK SURGERY  10/17, 80's   COLONOSCOPY     COLONOSCOPY     Per [facility] Patient Packet, Dr.Schooler    ESOPHAGOGASTRODUODENOSCOPY     LUMBAR LAMINECTOMY/DECOMPRESSION MICRODISCECTOMY Right 05/28/2016   Procedure: Right Lumbar Three-Four Microdiscectomy;  Surgeon: [name], MD;  Location: [facility];  Service: Neurosurgery;  Laterality: Right;   TOTAL HIP ARTHROPLASTY Right 07/21/2016   Procedure: TOTAL HIP ARTHROPLASTY ANTERIOR APPROACH;  Surgeon: [name], MD;  Location: [facility];  Service: Orthopedics;  Laterality: Right;    Allergies  Allergen Reactions   Tramadol Other (See Comments)    "does not eat" LOSS OF APPETITE    Outpatient Encounter Medications as of 05/14/2022  Medication Sig   acetaminophen (TYLENOL) 500 MG tablet Take 1,000 mg by mouth daily as needed for mild pain.   fexofenadine (ALLEGRA) 180 MG tablet Take 180 mg by mouth daily.   fluticasone (FLONASE) 50 MCG/ACT nasal spray Place 2 sprays into both nostrils daily.   gabapentin (NEURONTIN) 100 MG capsule Take 200 mg by mouth at bedtime. 8pm.   Hydroactive Dressings (DYNADERM HYDROCOLLOID 4"X4" EX) Apply topically every 3 (three) days. Cleanse dime-sized area on right buttocks with NSS, pat dry, apply 2X2 dressing, change every 3 days.   hydrocortisone 2.5 % cream Apply 1 Application topically 2 (two) times daily as needed (for facial rash).   hydrOXYzine (ATARAX) 10 MG tablet Take 10 mg by mouth 2 (two) times daily as needed (for agitation).   meclizine (ANTIVERT) 25 MG tablet Take 25 mg by mouth 3 (three) times daily as needed for dizziness.   Menthol, Topical Analgesic, (BIOFREEZE) 4 % GEL Apply 1 Application topically in the morning, at noon, in the evening, and at bedtime. Apply to right shoulder   mirtazapine (REMERON) 7.5 MG tablet Take 7.5 mg by mouth  at bedtime.   NAT-RUL PSYLLIUM SEED HUSKS PO Take 1 tablet by mouth daily. For constipation   Nutritional Supplements (RESOURCE 2.0) LIQD Take 180 mLs by mouth in the morning and at bedtime.   nystatin (MYCOSTATIN/NYSTOP) powder Apply 1 application  topically See admin instructions. Apply to periwound and groin every other day as needed   polyethylene glycol (MIRALAX / GLYCOLAX) 17 g packet Take 17 g by mouth daily.   promethazine (PHENERGAN) 25 MG suppository Place 25 mg rectally every 6 (six) hours as needed for nausea or vomiting.   QUEtiapine (SEROQUEL) 25 MG tablet Take 25 mg by mouth 2 (two) times daily.   sennosides-docusate sodium (SENOKOT-S) 8.6-50 MG tablet Take 2 tablets by mouth daily.   sertraline (ZOLOFT) 25 MG tablet Take 1 tablet (25 mg total) by mouth daily.   tamsulosin (FLOMAX) 0.4 MG CAPS capsule Take 0.4 mg by mouth at bedtime.   [  DISCONTINUED] morphine 10 MG/5ML solution Take 1.3 mLs (2.6 mg total) by mouth every 2 (two) hours as needed for severe pain.   [DISCONTINUED] oxyCODONE (OXY IR/ROXICODONE) 5 MG immediate release tablet Take 1 tablet (5 mg total) by mouth every 4 (four) hours as needed for moderate pain.   No facility-administered encounter medications on file as of 05/14/2022.    Review of Systems  Constitutional:  Positive for appetite change. Negative for activity change and fever.       Poor appetite  HENT:  Negative for sore throat.   Eyes: Negative.   Cardiovascular:  Negative for chest pain and leg swelling.  Gastrointestinal:  Negative for abdominal distention, diarrhea and vomiting.  Genitourinary:  Negative for dysuria, frequency and urgency.  Skin:  Negative for color change.  Neurological:  Negative for dizziness and headaches.  Psychiatric/Behavioral:  Negative for behavioral problems and sleep disturbance. The patient is not nervous/anxious.        Immunization History  Administered Date(s) Administered   Influenza-Unspecified 06/11/2015,  06/11/2019, 06/19/2020, 06/27/2021   Moderna Sars-Covid-2 Vaccination 09/10/2019, 10/08/2019, 07/17/2020, 02/05/2021   PFIZER(Purple Top)SARS-COV-2 Vaccination 09/10/2019   Pneumococcal Conjugate-13 09/12/2014   Pneumococcal Polysaccharide-23 04/21/2011   Pneumococcal-Unspecified 04/21/2019   Td 04/22/2002   Tetanus 05/16/2020   Unspecified SARS-COV-2 Vaccination 05/28/2021   Zoster Recombinat (Shingrix) 09/14/2021, 11/25/2021   Zoster, Live 04/27/2012   Pertinent  Health Maintenance Due  Topic Date Due   INFLUENZA VACCINE  04/08/2022      03/24/2019    3:10 PM 05/11/2022   10:25 PM 05/12/2022    3:39 AM 05/12/2022    9:00 PM 05/13/2022    2:00 PM  [employer] in the past year? 0      Was there an injury with Fall? 0      Fall Risk Category Calculator 0      Fall Risk Category Low      Patient Fall Risk Level Low fall risk Low fall risk High fall risk High fall risk High fall risk     Vitals:   05/14/22 1119  BP: (!) 152/88  Pulse: 67  Resp: 16  Temp: (!) 97.1 F (36.2 C)  SpO2: 90%  Weight: 161 lb (73 kg)  Height: '5\' 9"'$  (1.753 m)   Body mass index is 23.78 kg/m.  Physical Exam Constitutional:      General: He is not in acute distress.    Appearance: Normal appearance.  HENT:     Head: Normocephalic and atraumatic.     Mouth/Throat:     Mouth: Mucous membranes are moist.  Eyes:     Conjunctiva/sclera: Conjunctivae normal.  Cardiovascular:     Rate and Rhythm: Normal rate and regular rhythm.     Pulses: Normal pulses.     Heart sounds: Normal heart sounds.  Pulmonary:     Effort: Pulmonary effort is normal.     Breath sounds: Normal breath sounds.  Abdominal:     General: Bowel sounds are normal.     Palpations: Abdomen is soft.  Musculoskeletal:        General: No swelling.     Cervical back: Normal range of motion.  Skin:    General: Skin is warm and dry.  Neurological:     Mental Status: He is alert. Mental status is at baseline.  Psychiatric:         Mood and Affect: Mood normal.        Behavior: Behavior normal.  Comments: Alert to self, disoriented to time and place.        Labs reviewed: Recent Labs    03/15/22 0000 05/11/22 2255 05/12/22 0444  NA 137 139 139  K 4.2 4.0 3.7  CL 101 103 103  CO2 27* 21* 28  GLUCOSE  --  132* 126*  BUN '14 14 14  '$ CREATININE 0.7 0.88 0.88  CALCIUM 8.4* 8.4* 8.7*   Recent Labs    05/11/22 2255 05/12/22 0444  AST 26 21  ALT 14 16  ALKPHOS 85 80  BILITOT 1.5* 1.3*  PROT 6.3* 6.2*  ALBUMIN 2.8* 2.8*   Recent Labs    03/15/22 0000 05/11/22 2255 05/12/22 0444  WBC 6.7 13.3* 10.0  NEUTROABS 5,038.00 11.5* 8.4*  HGB 12.8* 12.8* 12.2*  HCT 41 40.1 38.3*  MCV  --  90.7 89.3  PLT 280 250 263   Lab Results  Component Value Date   TSH 0.98 02/06/2022   No results found for: "HGBA1C" Lab Results  Component Value Date   CHOL 114 05/31/2021   HDL 25 (A) 05/31/2021   LDLCALC 66 05/31/2021   TRIG 149 05/31/2021   CHOLHDL 5.1 (H) 03/31/2019    Significant Diagnostic Results in last 30 days:  CT Abdomen Pelvis W Contrast  Result Date: 05/12/2022 CLINICAL DATA:  Abdominal pain. EXAM: CT ABDOMEN AND PELVIS WITH CONTRAST TECHNIQUE: Multidetector CT imaging of the abdomen and pelvis was performed using the standard protocol following bolus administration of intravenous contrast. RADIATION DOSE REDUCTION: This exam was performed according to the departmental dose-optimization program which includes automated exposure control, adjustment of the mA and/or kV according to patient size and/or use of iterative reconstruction technique. CONTRAST:  119m OMNIPAQUE IOHEXOL 300 MG/ML  SOLN COMPARISON:  CT abdomen pelvis dated 04/11/2022. FINDINGS: Lower chest: Partially visualized small right pleural effusion. There is a small left lung base calcified granuloma. There is advanced coronary vascular calcification. No intra-abdominal free air or free fluid. Hepatobiliary: The liver is  unremarkable. There is mild biliary ductal dilatation or periportal edema. The gallbladder is contracted and appears inflamed. The gallbladder extends superiorly to the level of the diaphragm. There is fusion of the gallbladder fundus to the right diaphragm. Small pockets of air adjacent to the gallbladder fundus may be within the lumen of the gallbladder or represent focally contained perforation. Pancreas: No acute findings.  No inflammatory changes. Spleen: Normal in size without focal abnormality. Adrenals/Urinary Tract: The adrenal glands unremarkable several left renal cysts. There is no hydronephrosis on either side. There is symmetric enhancement and excretion of contrast by both kidneys. The visualized ureters and urinary bladder appear unremarkable. Stomach/Bowel: There is a moderate size hiatal hernia. Thickened and inflamed appearance of the second portion of the duodenum with surrounding stranding. Overall decrease in the degree of inflammation compared to prior CT. Underlying mass is not excluded. Further evaluation with endoscopy, as previously recommended and if not yet performed, advised. There is focal area of thickening of the medial wall of the distal ascending colon (25/3 and coronal 68/5), likely reactive to inflammatory changes of the duodenum. There is moderate amount of stool throughout the colon. There is no bowel obstruction. The appendix is normal. Vascular/Lymphatic: Moderate aortoiliac atherosclerotic disease. The IVC is unremarkable. No portal venous gas. There is no adenopathy. Reproductive: The prostate and seminal vesicles are grossly remarkable. Other: None Musculoskeletal: Osteopenia with degenerative changes of the spine. Total right hip arthroplasty. Similar appearance of L1 compression fracture and retropulsion of the  posterior cortex as seen on the prior CT. No new fracture. IMPRESSION: 1. Thickened and inflamed appearance of the second portion of the duodenum with surrounding  stranding. Overall decrease in the degree of inflammation compared to prior CT. Underlying mass is not excluded. Further evaluation with endoscopy, as previously recommended and if not yet performed, advised. 2. Contracted and inflamed gallbladder. There is adhesion of the gallbladder fundus to the right hemidiaphragm. Small pockets of air adjacent to the gallbladder fundus may be within the lumen of the gallbladder or represent focally contained perforation. 3. Moderate size hiatal hernia. 4. Partially visualized small right pleural effusion. 5. Similar appearance of L1 compression fracture and retropulsion of the posterior cortex as seen on the prior CT. 6. Aortic Atherosclerosis (ICD10-I70.0). Electronically Signed   By: Anner Crete M.D.   On: 05/12/2022 00:30    Assessment/Plan  1. Abnormal abdominal CT scan -   Repeat CT scan done on 05/11/22 again raised concern for due to bowel inflammation but also showed possible gallbladder inflammation and perforation.  He has been losing weight.  GI suspected that this is a duodenal mass that could be malignant and discussed with family.  He is not a good candidate for any invasive procedures or interventions due to dementia.  It was thought that his life expectancy is to be less than 6 months.  Family has agreed to transition to hospice/comfort care. -   will discontinue Oxycodone and start Morphine 20 mg/ml give 5 mg/0.25 ml SL Q 6 hours PRN for pain -  discontinue Morphine Q 2 hours PRN  2. Idiopathic peripheral neuropathy -   continue Gabapentin  3. Poor appetite Wt Readings from Last 3 Encounters:  05/16/22 172 lb 14.4 oz (78.4 kg)  05/14/22 161 lb (73 kg)  05/07/22 161 lb (73 kg)   -  continue Remeron  5. Nausea and vomiting, unspecified vomiting type -  continue Promethazine suppository PRN  6. Severe vascular dementia with mood disturbance (HCC) -  BIMS score 4/15, ranging in severe cognitive impairment -  CT head done on 04/11/22  showed progressive cerebral atrophy and chronic small vessel ischemic disease -  continue comfort care/hospice     Family/ staff Communication: Discussed plan of care with resident, wife and charge nurse.  Labs/tests ordered:  None    Durenda Age, DNP, MSN, FNP-BC [facility] [phone] (Monday-Friday 8:00 a.m. - 5:00 p.m.) [phone] (after hours)

## 2022-05-15 ENCOUNTER — Other Ambulatory Visit: Payer: Self-pay

## 2022-05-15 LAB — CULTURE, BLOOD (ROUTINE X 2): Special Requests: ADEQUATE

## 2022-05-15 MED ORDER — MORPHINE SULFATE 10 MG/5ML PO SOLN: 2.5000 mg | ORAL | 0 refills | Status: AC | PRN

## 2022-05-15 NOTE — Telephone Encounter (Signed)
[facility] has request refill on medication Morphine. Patient last refill dated 05/13/2022. Medication pend and sent to PCP Mast, Man X, NP

## 2022-05-16 ENCOUNTER — Encounter: Payer: Self-pay | Admitting: Family Medicine

## 2022-05-16 ENCOUNTER — Non-Acute Institutional Stay (SKILLED_NURSING_FACILITY): Payer: Medicare PPO | Admitting: Family Medicine

## 2022-05-16 DIAGNOSIS — R634 Abnormal weight loss: Secondary | ICD-10-CM

## 2022-05-16 DIAGNOSIS — R413 Other amnesia: Secondary | ICD-10-CM

## 2022-05-16 DIAGNOSIS — R935 Abnormal findings on diagnostic imaging of other abdominal regions, including retroperitoneum: Secondary | ICD-10-CM | POA: Diagnosis not present

## 2022-05-16 DIAGNOSIS — G6289 Other specified polyneuropathies: Secondary | ICD-10-CM | POA: Diagnosis not present

## 2022-05-16 NOTE — Progress Notes (Signed)
Provider:   Location:  [place] Room Number: 16-A Place of Service:  SNF (31)  PCP: Mast, Man X, NP Patient Care Team: Mast, Man X, NP as PCP - General (Internal Medicine) [name], [name], MD as Consulting Physician (Orthopedic Surgery) [name], MD as Consulting Physician (Gastroenterology) System, Provider Not In [name], MD as Consulting Physician (Neurosurgery) [name], MD as Referring Physician (Ophthalmology) Mast, Man X, NP as Nurse Practitioner (Internal Medicine) Almedia Balls, MD as Referring Physician (Orthopedic Surgery) [name], MD as Consulting Physician (Neurosurgery) Elsie Saas, MD as Consulting Physician (Orthopedic Surgery) Allyn Kenner, MD (Dermatology) Kathrynn Ducking, MD (Inactive) as Consulting Physician (Neurology) Franchot Gallo, MD as Consulting Physician (Urology)  Extended Emergency Contact Information Primary Emergency Contact: [name] Address: [place]          [employer]          [place], [place] [address] [place] Phone: [phone] Relation: Spouse Secondary Emergency Contact: [place] Mobile Phone: [phone] Relation: Relative  Code Status: DNR Goals of Care: Advanced Directive information    05/16/2022   11:33 AM  Advanced Directives  Does Patient Have a Medical Advance Directive? Yes  Type of Paramedic of [place];Living will;Out of facility DNR (pink MOST or yellow form)  Does patient want to make changes to medical advance directive? No - Patient declined  Copy of [place] in Chart? Yes - validated most recent copy scanned in chart (See row information)  Pre-existing out of facility DNR order (yellow form or pink MOST form) Yellow form placed in chart (order not valid for inpatient use)      Chief Complaint  Patient presents with   New Admit To SNF    HPI: Patient is a 88 y.o.  male seen today for admission to [facility] after hospital evaluation admission from May 11, 2022 to May 13, 2022.  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  He had been taken to the emergency room following an episode of nausea and vomiting.  History actually goes back several months that has been characterized by pain anorexia and weight loss.  Repeat CT scan showed duodenal inflammation as well as gallbladder inflammation.  In consultation with family he was felt not to be a surgical candidate due to his dementia.  He was referred to hospice since his life expectancy is thought to be less than 6 months.  Goal is now comfort care  Past Medical History:  Diagnosis Date   Abnormal MRI, knee    Per [facility] New Patient Packet    Anemia    Anticoagulated    Anxiety    Arthritis    Cervical disc disorder    Per [facility] New Patient Packet    Complication of anesthesia    states "I was [place] several weeks after surgery"caused by tramadol   Constipation    Dysuria 07/28/2016   Edema 07/24/2016   Gait abnormality 07/25/2016   Hard of hearing    Head injury    As a result of a fall, Per [facility] New Patient Packet    Hip arthritis 07/21/2016   History of fall 07/25/2016   Hypercholesteremia    Hypertension    Lumbar spinal stenosis    Memory loss 07/28/2016   mild   Obesity    Per [facility] New Patient Packet    Osteoarthritis of right hip 07/24/2016  Peripheral neuropathy 03/10/2018   Sleep apnea    cpap   Spinal stenosis of lumbar region 05/28/2016   Urinary frequency    Venous thrombosis of leg 07/24/2016   right gastrocnemius   Vertigo    Wears glasses    Weight loss 04/08/2016   Past Surgical History:  Procedure Laterality Date   BACK SURGERY  10/17, 80's   COLONOSCOPY     COLONOSCOPY     Per [place] Patient Packet, Dr.Schooler    ESOPHAGOGASTRODUODENOSCOPY     LUMBAR LAMINECTOMY/DECOMPRESSION MICRODISCECTOMY Right  05/28/2016   Procedure: Right Lumbar Three-Four Microdiscectomy;  Surgeon: [name], MD;  Location: [place] NEURO ORS;  Service: Neurosurgery;  Laterality: Right;   TOTAL HIP ARTHROPLASTY Right 07/21/2016   Procedure: TOTAL HIP ARTHROPLASTY ANTERIOR APPROACH;  Surgeon: [name], MD;  Location: [place];  Service: Orthopedics;  Laterality: Right;    reports that he quit smoking about 53 years ago. His smoking use included cigarettes. He has a 4.00 pack-year smoking history. He has never used smokeless tobacco. He reports that he does not drink alcohol and does not use drugs. Social History   Socioeconomic History   Marital status: Married    Spouse name: Not on file   Number of children: 2   Years of education: MS   Highest education level: Not on file  Occupational History   Not on file  Tobacco Use   Smoking status: Former    Packs/day: 1.00    Years: 4.00    Total pack years: 4.00    Types: Cigarettes    Quit date: 07/16/1968    Years since quitting: 53.8   Smokeless tobacco: Never   Tobacco comments:    quit 50 years ago  Vaping Use   Vaping Use: Never used  Substance and Sexual Activity   Alcohol use: No   Drug use: No   Sexual activity: Not Currently    Partners: Female  Other Topics Concern   Not on file  Social History Narrative   Lives w/ wife   Caffeine use: Coffee daily   Right handed    Retired. Prior rehab counselor. Resident at [facility] since about 2001.      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 Yes   Do you have difficulty preparing food or eating? Yes   Do you have difficulty managing your medications? Yes   Do you have difficulty managing your finances? Yes   Do you have difficulty affording your medications? No   Social Determinants of Radio broadcast assistant Strain: Not on file  Food Insecurity: Not on file  Transportation Needs: Not on file  Physical Activity: Not on file  Stress: Not on file  Social Connections: Not on file  Intimate Partner Violence: Not on file  Functional Status Survey:    Family History  Problem Relation Age of Onset   Heart disease Mother    Transient ischemic attack Mother    Kidney failure Father    Alzheimer's disease Sister    AAA (abdominal aortic aneurysm) Sister    Lung cancer Brother    Memory loss Sister    Memory loss Sister    Heart disease Sister    Cancer Sister    Diabetes Sister    Cancer Sister     Health Maintenance  Topic Date Due   COVID-19 Vaccine (7 - Mixed Product series) 09/27/2021   INFLUENZA VACCINE  04/08/2022   TETANUS/TDAP  05/16/2030   Pneumonia Vaccine 15+ Years old  Completed   Zoster Vaccines- Shingrix  Completed   HPV VACCINES  Aged Out    Allergies  Allergen Reactions   Tramadol Other (See Comments)    "does not eat" LOSS OF APPETITE    Outpatient Encounter Medications as of 05/16/2022  Medication Sig   acetaminophen (TYLENOL) 500 MG tablet Take 1,000 mg by mouth daily as needed for mild pain.   fexofenadine (ALLEGRA) 180 MG tablet Take 180 mg by mouth daily.   fluticasone (FLONASE) 50 MCG/ACT nasal spray Place 2 sprays into both nostrils daily.   gabapentin (NEURONTIN) 100 MG capsule Take 200 mg by mouth at bedtime. 8pm.   Hydroactive Dressings (DYNADERM HYDROCOLLOID 4"X4" EX) Apply topically every 3 (three) days. Cleanse dime-sized area on right buttocks with NSS, pat dry, apply 2X2 dressing, change every 3 days.   hydrocortisone 2.5 % cream Apply 1  Application topically 2 (two) times daily as needed (for facial rash).   hydrOXYzine (ATARAX) 10 MG tablet Take 10 mg by mouth 2 (two) times daily as needed (for agitation).   meclizine (ANTIVERT) 25 MG tablet Take 25 mg by mouth 3 (three) times daily as needed for dizziness.   Menthol, Topical Analgesic, (BIOFREEZE) 4 % GEL Apply 1 Application topically in the morning, at noon, in the evening, and at bedtime. Apply to right shoulder   mirtazapine (REMERON) 7.5 MG tablet Take 7.5 mg by mouth at bedtime.   morphine 10 MG/5ML solution Take 1.3 mLs (2.6 mg total) by mouth every 2 (two) hours as needed for severe pain.   NAT-RUL PSYLLIUM SEED HUSKS PO Take 1 tablet by mouth daily. For constipation   Nutritional Supplements (RESOURCE 2.0) LIQD Take 180 mLs by mouth in the morning and at bedtime.   nystatin (MYCOSTATIN/NYSTOP) powder Apply 1 application  topically See admin instructions. Apply to periwound and groin every other day as needed   polyethylene glycol (MIRALAX / GLYCOLAX) 17 g packet Take 17 g by mouth daily.   promethazine (PHENERGAN) 25 MG suppository Place 25 mg rectally every 6 (six) hours as needed for nausea or vomiting.   QUEtiapine (SEROQUEL) 25 MG tablet Take 25 mg by mouth 2 (two) times daily.   sennosides-docusate sodium (SENOKOT-S) 8.6-50 MG tablet Take 2 tablets by mouth daily.   sertraline (ZOLOFT) 25 MG tablet Take 1 tablet (25 mg total) by mouth daily.   tamsulosin (FLOMAX) 0.4 MG CAPS capsule Take 0.4 mg by mouth at bedtime.   [DISCONTINUED] oxyCODONE (OXY IR/ROXICODONE) 5 MG immediate release tablet Take 1 tablet (5 mg total) by mouth every 4 (four) hours as needed for moderate pain.   No facility-administered encounter medications on file as of 05/16/2022.    Review of Systems  Unable to perform ROS: Dementia    Vitals:  05/16/22 1130  BP: 92/70  Pulse: 84  Resp: 20  Temp: (!) 96.3 F (35.7 C)  SpO2: 93%  Weight: 172 lb 14.4 oz (78.4 kg)  Height: '5\' 9"'$  (1.753  m)   Body mass index is 25.53 kg/m. Physical Exam Vitals and nursing note reviewed.  Constitutional:      General: He is not in acute distress. HENT:     Head: Normocephalic.  Cardiovascular:     Rate and Rhythm: Normal rate and regular rhythm.  Pulmonary:     Effort: Pulmonary effort is normal.     Breath sounds: Normal breath sounds.  Abdominal:     General: Bowel sounds are normal.     Palpations: There is no mass.     Tenderness: There is no abdominal tenderness.  Neurological:     General: No focal deficit present.     Mental Status: He is alert.     Comments: Patient is aware of person but not place or time  Psychiatric:        Mood and Affect: Mood normal.     Labs reviewed: Basic Metabolic Panel: Recent Labs    03/15/22 0000 05/11/22 2255 05/12/22 0444  NA 137 139 139  K 4.2 4.0 3.7  CL 101 103 103  CO2 27* 21* 28  GLUCOSE  --  132* 126*  BUN '14 14 14  '$ CREATININE 0.7 0.88 0.88  CALCIUM 8.4* 8.4* 8.7*   Liver Function Tests: Recent Labs    05/11/22 2255 05/12/22 0444  AST 26 21  ALT 14 16  ALKPHOS 85 80  BILITOT 1.5* 1.3*  PROT 6.3* 6.2*  ALBUMIN 2.8* 2.8*   Recent Labs    05/11/22 2255  LIPASE 31   No results for input(s): "AMMONIA" in the last 8760 hours. CBC: Recent Labs    03/15/22 0000 05/11/22 2255 05/12/22 0444  WBC 6.7 13.3* 10.0  NEUTROABS 5,038.00 11.5* 8.4*  HGB 12.8* 12.8* 12.2*  HCT 41 40.1 38.3*  MCV  --  90.7 89.3  PLT 280 250 263   Cardiac Enzymes: No results for input(s): "CKTOTAL", "CKMB", "CKMBINDEX", "TROPONINI" in the last 8760 hours. BNP: Invalid input(s): "POCBNP" No results found for: "HGBA1C" Lab Results  Component Value Date   TSH 0.98 02/06/2022   Lab Results  Component Value Date   VITAMINB12 >2,000 (H) 03/31/2019   No results found for: "FOLATE" No results found for: "IRON", "TIBC", "FERRITIN"  Imaging and Procedures obtained prior to SNF admission: CT Abdomen Pelvis W Contrast  Result  Date: 05/12/2022 CLINICAL DATA:  Abdominal pain. EXAM: CT ABDOMEN AND PELVIS WITH CONTRAST TECHNIQUE: Multidetector CT imaging of the abdomen and pelvis was performed using the standard protocol following bolus administration of intravenous contrast. RADIATION DOSE REDUCTION: This exam was performed according to the departmental dose-optimization program which includes automated exposure control, adjustment of the mA and/or kV according to patient size and/or use of iterative reconstruction technique. CONTRAST:  131m OMNIPAQUE IOHEXOL 300 MG/ML  SOLN COMPARISON:  CT abdomen pelvis dated 04/11/2022. FINDINGS: Lower chest: Partially visualized small right pleural effusion. There is a small left lung base calcified granuloma. There is advanced coronary vascular calcification. No intra-abdominal free air or free fluid. Hepatobiliary: The liver is unremarkable. There is mild biliary ductal dilatation or periportal edema. The gallbladder is contracted and appears inflamed. The gallbladder extends superiorly to the level of the diaphragm. There is fusion of the gallbladder fundus to the right diaphragm. Small pockets of air adjacent to the  gallbladder fundus may be within the lumen of the gallbladder or represent focally contained perforation. Pancreas: No acute findings.  No inflammatory changes. Spleen: Normal in size without focal abnormality. Adrenals/Urinary Tract: The adrenal glands unremarkable several left renal cysts. There is no hydronephrosis on either side. There is symmetric enhancement and excretion of contrast by both kidneys. The visualized ureters and urinary bladder appear unremarkable. Stomach/Bowel: There is a moderate size hiatal hernia. Thickened and inflamed appearance of the second portion of the duodenum with surrounding stranding. Overall decrease in the degree of inflammation compared to prior CT. Underlying mass is not excluded. Further evaluation with endoscopy, as previously recommended and if  not yet performed, advised. There is focal area of thickening of the medial wall of the distal ascending colon (25/3 and coronal 68/5), likely reactive to inflammatory changes of the duodenum. There is moderate amount of stool throughout the colon. There is no bowel obstruction. The appendix is normal. Vascular/Lymphatic: Moderate aortoiliac atherosclerotic disease. The IVC is unremarkable. No portal venous gas. There is no adenopathy. Reproductive: The prostate and seminal vesicles are grossly remarkable. Other: None Musculoskeletal: Osteopenia with degenerative changes of the spine. Total right hip arthroplasty. Similar appearance of L1 compression fracture and retropulsion of the posterior cortex as seen on the prior CT. No new fracture. IMPRESSION: 1. Thickened and inflamed appearance of the second portion of the duodenum with surrounding stranding. Overall decrease in the degree of inflammation compared to prior CT. Underlying mass is not excluded. Further evaluation with endoscopy, as previously recommended and if not yet performed, advised. 2. Contracted and inflamed gallbladder. There is adhesion of the gallbladder fundus to the right hemidiaphragm. Small pockets of air adjacent to the gallbladder fundus may be within the lumen of the gallbladder or represent focally contained perforation. 3. Moderate size hiatal hernia. 4. Partially visualized small right pleural effusion. 5. Similar appearance of L1 compression fracture and retropulsion of the posterior cortex as seen on the prior CT. 6. Aortic Atherosclerosis (ICD10-I70.0). Electronically Signed   By: Anner Crete M.D.   On: 05/12/2022 00:30    Assessment/Plan 1. 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  Certainly has clinical picture over the preceding months with weight loss anorexia support the diagnosis  2. Axonal sensorimotor neuropathy This has been evaluated previously with nerve conduction  studies.  He continues with gabapentin and a wheelchair for mobility  3. Memory loss CT scan shows progressive cerebral atrophy chronic small vessel ischemic disease.  Intermittent agitation treated with antipsychotics  4. Weight loss Likely related to underlying disease process.  We will continue with supportive care.  Hospice is now also involved with his general care and comfort    Family/ staff Communication:   Labs/tests ordered:

## 2022-05-17 LAB — CULTURE, BLOOD (ROUTINE X 2)
Culture: NO GROWTH
Special Requests: ADEQUATE

## 2022-05-19 DIAGNOSIS — F411 Generalized anxiety disorder: Secondary | ICD-10-CM | POA: Diagnosis not present

## 2022-06-03 DIAGNOSIS — F3189 Other bipolar disorder: Secondary | ICD-10-CM | POA: Diagnosis not present

## 2022-06-03 DIAGNOSIS — F411 Generalized anxiety disorder: Secondary | ICD-10-CM | POA: Diagnosis not present

## 2022-06-05 ENCOUNTER — Encounter: Payer: Self-pay | Admitting: Nurse Practitioner

## 2022-06-05 ENCOUNTER — Non-Acute Institutional Stay (SKILLED_NURSING_FACILITY): Payer: Medicare PPO | Admitting: Nurse Practitioner

## 2022-06-05 DIAGNOSIS — K5900 Constipation, unspecified: Secondary | ICD-10-CM

## 2022-06-05 DIAGNOSIS — F418 Other specified anxiety disorders: Secondary | ICD-10-CM

## 2022-06-05 DIAGNOSIS — G6289 Other specified polyneuropathies: Secondary | ICD-10-CM | POA: Diagnosis not present

## 2022-06-05 DIAGNOSIS — R634 Abnormal weight loss: Secondary | ICD-10-CM | POA: Diagnosis not present

## 2022-06-05 DIAGNOSIS — M159 Polyosteoarthritis, unspecified: Secondary | ICD-10-CM

## 2022-06-05 DIAGNOSIS — R413 Other amnesia: Secondary | ICD-10-CM | POA: Diagnosis not present

## 2022-06-05 DIAGNOSIS — R350 Frequency of micturition: Secondary | ICD-10-CM

## 2022-06-05 NOTE — Assessment & Plan Note (Signed)
Intermittent agitation, yelling, emotional outburst, on Sertraline, Mirtazapine, irritable, on prn Hydroxyzine since 04/22/22, dc'd Zyprexa 02/25/22. Will increase Seroquel '50mg'$  bid, adding Lorazepam '1mg'$  q4hr prn x 14 days.

## 2022-06-05 NOTE — Assessment & Plan Note (Signed)
Left knee OA, R shoulder,  X-ray R hip/knee, f/u Ortho, prn Tylenol, Morphine.

## 2022-06-05 NOTE — Assessment & Plan Note (Signed)
Dementia/Disorientation: CT head with CM, No evidence of acute intracranial abnormality or mass. Progressive cerebral atrophy and chronic small vessel ischemic disease. 

## 2022-06-05 NOTE — Assessment & Plan Note (Signed)
Stable, takes Senokot S, MiraLax. 

## 2022-06-05 NOTE — Assessment & Plan Note (Signed)
Weight loss: CT abd/pelvis with CM, Underlying neoplasm or duodenal ulcer, Had MRI, underwent GI evaluation, under Hospice service. Abd pain, prn Morphine is available to him.  

## 2022-06-05 NOTE — Assessment & Plan Note (Signed)
weakness in legs, w/c for mobility, on Gabapentin. Had nerve conduction study 03/10/18 

## 2022-06-05 NOTE — Progress Notes (Signed)
Location:  [place] Room Number: NO/16/A Place of Service:  SNF (31) Provider:  Dantonio Justen X, NP  Patient Care Team: Raechel Marcos X, NP as PCP - General (Internal Medicine) [name], [name], MD as Consulting Physician (Orthopedic Surgery) [name], MD as Consulting Physician (Gastroenterology) System, Provider Not In [name], MD as Consulting Physician (Neurosurgery) [name], MD as Referring Physician (Ophthalmology) Romie Tay X, NP as Nurse Practitioner (Internal Medicine) Almedia Balls, MD as Referring Physician (Orthopedic Surgery) [name], MD as Consulting Physician (Neurosurgery) Elsie Saas, MD as Consulting Physician (Orthopedic Surgery) Allyn Kenner, MD (Dermatology) Kathrynn Ducking, MD (Inactive) as Consulting Physician (Neurology) Franchot Gallo, MD as Consulting Physician (Urology)  Extended Emergency Contact Information Primary Emergency Contact: [name] Address: [address]          [place], [place] [address] [place] Phone: [phone] Relation: Spouse Secondary Emergency Contact: [place] Phone: [phone] Relation: Relative  Code Status:  DNR Goals of care: Advanced Directive information    05/16/2022   11:33 AM  Advanced Directives  Does Patient Have a Medical Advance Directive? Yes  Type of Paramedic of [place];Living will;Out of facility DNR (pink MOST or yellow form)  Does patient want to make changes to medical advance directive? No - Patient declined  Copy of [place] in Chart? Yes - validated most recent copy scanned in chart (See row information)  Pre-existing out of facility DNR order (yellow form or pink MOST form) Yellow form placed in chart (order not valid for inpatient use)     Chief Complaint  Patient presents with   Acute Visit    Patient is being seen for agitation     HPI:  Pt is a 88 y.o. male seen today for an acute visit for patient's irritability, unable to redirect                           Weight loss: CT abd/pelvis with CM, Underlying neoplasm or duodenal ulcer, Had MRI, underwent GI evaluation, under Hospice service. Abd pain, prn Morphine is available to him.              Dementia/Disorientation: CT head with CM, No evidence of acute intracranial abnormality or mass. Progressive cerebral atrophy and chronic small vessel ischemic disease. Peripheral neuropathy, weakness in legs, w/c for mobility, on Gabapentin. Had nerve conduction study 03/10/18             Anxiety/depression, on Sertraline, Mirtazapine, irritable, on prn Hydroxyzine since 04/22/22, dc'd Zyprexa 02/25/22. On Seroquel             Left knee OA, R shoulder,  X-ray R hip/knee, f/u Ortho, prn Tylenol, Morphine.              Urinary frequency, stable, on Tamsulosin. underwent Urology evaluation in the past.              Constipation, takes Senokot S, MiraLax             Allergic rhinitis, takes Fluticasone nasal spray, Fexofenadine.                 Past Medical History:  Diagnosis Date   Abnormal MRI, knee    Per [facility] New Patient Packet    Anemia    Anticoagulated    Anxiety  Arthritis    Cervical disc disorder    Per [facility] New Patient Packet    Complication of anesthesia    states "[place] after surgery"caused by tramadol   Constipation    Dysuria 07/28/2016   Edema 07/24/2016   Gait abnormality 07/25/2016   Hard of hearing    Head injury    As a result of a fall, Per [facility] New Patient Packet    Hip arthritis 07/21/2016   History of fall 07/25/2016   Hypercholesteremia    Hypertension    Lumbar spinal stenosis    Memory loss 07/28/2016   mild   Obesity    Per [facility] New Patient Packet    Osteoarthritis of right hip 07/24/2016   Peripheral neuropathy 03/10/2018   Sleep apnea    cpap   Spinal stenosis of lumbar region 05/28/2016   Urinary  frequency    Venous thrombosis of leg 07/24/2016   right gastrocnemius   Vertigo    Wears glasses    Weight loss 04/08/2016   Past Surgical History:  Procedure Laterality Date   BACK SURGERY  10/17, 80's   COLONOSCOPY     COLONOSCOPY     Per [facility] New Patient Packet, Dr.Schooler    ESOPHAGOGASTRODUODENOSCOPY     LUMBAR LAMINECTOMY/DECOMPRESSION MICRODISCECTOMY Right 05/28/2016   Procedure: Right Lumbar Three-Four Microdiscectomy;  Surgeon: [name], MD;  Location: [facility];  Service: Neurosurgery;  Laterality: Right;   TOTAL HIP ARTHROPLASTY Right 07/21/2016   Procedure: TOTAL HIP ARTHROPLASTY ANTERIOR APPROACH;  Surgeon: [name], MD;  Location: [facility];  Service: Orthopedics;  Laterality: Right;    Allergies  Allergen Reactions   Tramadol Other (See Comments)    "does not eat" LOSS OF APPETITE    Outpatient Encounter Medications as of 06/05/2022  Medication Sig   acetaminophen (TYLENOL) 500 MG tablet Take 1,000 mg by mouth daily as needed for mild pain.   fexofenadine (ALLEGRA) 180 MG tablet Take 180 mg by mouth daily.   fluticasone (FLONASE) 50 MCG/ACT nasal spray Place 2 sprays into both nostrils daily.   gabapentin (NEURONTIN) 100 MG capsule Take 200 mg by mouth at bedtime. 8pm.   Hydroactive Dressings (DYNADERM HYDROCOLLOID 4"X4" EX) Apply topically every 3 (three) days. Cleanse dime-sized area on right buttocks with NSS, pat dry, apply 2X2 dressing, change every 3 days.   hydrocortisone 2.5 % cream Apply 1 Application topically 2 (two) times daily as needed (for facial rash).   hydrOXYzine (ATARAX) 10 MG tablet Take 10 mg by mouth 2 (two) times daily as needed (for agitation).   meclizine (ANTIVERT) 25 MG tablet Take 25 mg by mouth 3 (three) times daily as needed for dizziness.   Menthol, Topical Analgesic, (BIOFREEZE) 4 % GEL Apply 1 Application topically in the morning, at noon, in the evening, and at bedtime. Apply to right shoulder   mirtazapine (REMERON) 7.5  MG tablet Take 7.5 mg by mouth at bedtime.   morphine 10 MG/5ML solution Take 1.3 mLs (2.6 mg total) by mouth every 2 (two) hours as needed for severe pain.   NAT-RUL PSYLLIUM SEED HUSKS PO Take 1 tablet by mouth daily. For constipation   Nutritional Supplements (RESOURCE 2.0) LIQD Take 180 mLs by mouth in the morning and at bedtime.   nystatin (MYCOSTATIN/NYSTOP) powder Apply 1 application  topically See admin instructions. Apply to periwound and groin every other day as needed   polyethylene glycol (MIRALAX / GLYCOLAX) 17 g  packet Take 17 g by mouth daily.   promethazine (PHENERGAN) 25 MG suppository Place 25 mg rectally every 6 (six) hours as needed for nausea or vomiting.   QUEtiapine (SEROQUEL) 25 MG tablet Take 50 mg by mouth 2 (two) times daily.   sennosides-docusate sodium (SENOKOT-S) 8.6-50 MG tablet Take 2 tablets by mouth daily.   sertraline (ZOLOFT) 25 MG tablet Take 1 tablet (25 mg total) by mouth daily.   tamsulosin (FLOMAX) 0.4 MG CAPS capsule Take 0.4 mg by mouth at bedtime.   No facility-administered encounter medications on file as of 06/05/2022.    Review of Systems  Unable to perform ROS: Dementia    Immunization History  Administered Date(s) Administered   Influenza-Unspecified 06/11/2015, 06/11/2019, 06/19/2020, 06/27/2021   Moderna Sars-Covid-2 Vaccination 09/10/2019, 10/08/2019, 07/17/2020, 02/05/2021   PFIZER(Purple Top)SARS-COV-2 Vaccination 09/10/2019   Pneumococcal Conjugate-13 09/12/2014   Pneumococcal Polysaccharide-23 04/21/2011   Pneumococcal-Unspecified 04/21/2019   Td 04/22/2002   Tetanus 05/16/2020   Unspecified SARS-COV-2 Vaccination 05/28/2021   Zoster Recombinat (Shingrix) 09/14/2021, 11/25/2021   Zoster, Live 04/27/2012   Pertinent  Health Maintenance Due  Topic Date Due   INFLUENZA VACCINE  04/08/2022      03/24/2019    3:10 PM 05/11/2022   10:25 PM 05/12/2022    3:39 AM 05/12/2022    9:00 PM 05/13/2022    2:00 PM  [place] in the  past year? 0      Was there an injury with Fall? 0      Fall Risk Category Calculator 0      Fall Risk Category Low      Patient Fall Risk Level Low fall risk Low fall risk High fall risk High fall risk High fall risk   Functional Status Survey:    Vitals:   06/05/22 1050  BP: 118/62  Pulse: 88  Resp: 14  Temp: (!) 97.1 F (36.2 C)  SpO2: 90%  Weight: 161 lb (73 kg)  Height: '5\' 9"'$  (1.753 m)   Body mass index is 23.78 kg/m. Physical Exam Vitals and nursing note reviewed.  Constitutional:      Comments: frail  HENT:     Head: Normocephalic and atraumatic.     Mouth/Throat:     Mouth: Mucous membranes are dry.  Eyes:     Extraocular Movements: Extraocular movements intact.     Conjunctiva/sclera: Conjunctivae normal.     Pupils: Pupils are equal, round, and reactive to light.  Cardiovascular:     Rate and Rhythm: Normal rate and regular rhythm.     Heart sounds: No murmur heard. Pulmonary:     Effort: Pulmonary effort is normal.     Breath sounds: No rales.  Abdominal:     General: Bowel sounds are normal.     Palpations: Abdomen is soft.     Tenderness: There is no abdominal tenderness.  Musculoskeletal:        General: Tenderness present.     Cervical back: Normal range of motion and neck supple.     Right lower leg: No edema.     Left lower leg: No edema.     Comments: R shoulder pain with ROM is chronic. Chronic R+L knee pain, knee support L knee. S/p R hips replacement. S/p cervical/lumbar spine surgeries.   Skin:    General: Skin is warm and dry.  Neurological:     General: No focal deficit present.     Mental Status: He is alert. Mental status is at  baseline.     Motor: Weakness present.     Gait: Gait abnormal.     Comments: Moves slow, uses w/c for mobility,  lower body weakness, peripheral neuropathy in legs.   Psychiatric:     Comments: Anxious, talkative, irritable, unable to redirect.      Labs reviewed: Recent Labs    03/15/22 0000  05/11/22 2255 05/12/22 0444  NA 137 139 139  K 4.2 4.0 3.7  CL 101 103 103  CO2 27* 21* 28  GLUCOSE  --  132* 126*  BUN '14 14 14  '$ CREATININE 0.7 0.88 0.88  CALCIUM 8.4* 8.4* 8.7*   Recent Labs    05/11/22 2255 05/12/22 0444  AST 26 21  ALT 14 16  ALKPHOS 85 80  BILITOT 1.5* 1.3*  PROT 6.3* 6.2*  ALBUMIN 2.8* 2.8*   Recent Labs    03/15/22 0000 05/11/22 2255 05/12/22 0444  WBC 6.7 13.3* 10.0  NEUTROABS 5,038.00 11.5* 8.4*  HGB 12.8* 12.8* 12.2*  HCT 41 40.1 38.3*  MCV  --  90.7 89.3  PLT 280 250 263   Lab Results  Component Value Date   TSH 0.98 02/06/2022   No results found for: "HGBA1C" Lab Results  Component Value Date   CHOL 114 05/31/2021   HDL 25 (A) 05/31/2021   LDLCALC 66 05/31/2021   TRIG 149 05/31/2021   CHOLHDL 5.1 (H) 03/31/2019    Significant Diagnostic Results in last 30 days:  CT Abdomen Pelvis W Contrast  Result Date: 05/12/2022 CLINICAL DATA:  Abdominal pain. EXAM: CT ABDOMEN AND PELVIS WITH CONTRAST TECHNIQUE: Multidetector CT imaging of the abdomen and pelvis was performed using the standard protocol following bolus administration of intravenous contrast. RADIATION DOSE REDUCTION: This exam was performed according to the departmental dose-optimization program which includes automated exposure control, adjustment of the mA and/or kV according to patient size and/or use of iterative reconstruction technique. CONTRAST:  154m OMNIPAQUE IOHEXOL 300 MG/ML  SOLN COMPARISON:  CT abdomen pelvis dated 04/11/2022. FINDINGS: Lower chest: Partially visualized small right pleural effusion. There is a small left lung base calcified granuloma. There is advanced coronary vascular calcification. No intra-abdominal free air or free fluid. Hepatobiliary: The liver is unremarkable. There is mild biliary ductal dilatation or periportal edema. The gallbladder is contracted and appears inflamed. The gallbladder extends superiorly to the level of the diaphragm. There is  fusion of the gallbladder fundus to the right diaphragm. Small pockets of air adjacent to the gallbladder fundus may be within the lumen of the gallbladder or represent focally contained perforation. Pancreas: No acute findings.  No inflammatory changes. Spleen: Normal in size without focal abnormality. Adrenals/Urinary Tract: The adrenal glands unremarkable several left renal cysts. There is no hydronephrosis on either side. There is symmetric enhancement and excretion of contrast by both kidneys. The visualized ureters and urinary bladder appear unremarkable. Stomach/Bowel: There is a moderate size hiatal hernia. Thickened and inflamed appearance of the second portion of the duodenum with surrounding stranding. Overall decrease in the degree of inflammation compared to prior CT. Underlying mass is not excluded. Further evaluation with endoscopy, as previously recommended and if not yet performed, advised. There is focal area of thickening of the medial wall of the distal ascending colon (25/3 and coronal 68/5), likely reactive to inflammatory changes of the duodenum. There is moderate amount of stool throughout the colon. There is no bowel obstruction. The appendix is normal. Vascular/Lymphatic: Moderate aortoiliac atherosclerotic disease. The IVC is unremarkable. No portal venous  gas. There is no adenopathy. Reproductive: The prostate and seminal vesicles are grossly remarkable. Other: None Musculoskeletal: Osteopenia with degenerative changes of the spine. Total right hip arthroplasty. Similar appearance of L1 compression fracture and retropulsion of the posterior cortex as seen on the prior CT. No new fracture. IMPRESSION: 1. Thickened and inflamed appearance of the second portion of the duodenum with surrounding stranding. Overall decrease in the degree of inflammation compared to prior CT. Underlying mass is not excluded. Further evaluation with endoscopy, as previously recommended and if not yet performed,  advised. 2. Contracted and inflamed gallbladder. There is adhesion of the gallbladder fundus to the right hemidiaphragm. Small pockets of air adjacent to the gallbladder fundus may be within the lumen of the gallbladder or represent focally contained perforation. 3. Moderate size hiatal hernia. 4. Partially visualized small right pleural effusion. 5. Similar appearance of L1 compression fracture and retropulsion of the posterior cortex as seen on the prior CT. 6. Aortic Atherosclerosis (ICD10-I70.0). Electronically Signed   By: Anner Crete M.D.   On: 05/12/2022 00:30    Assessment/Plan Depression with anxiety Intermittent agitation, yelling, emotional outburst, on Sertraline, Mirtazapine, irritable, on prn Hydroxyzine since 04/22/22, dc'd Zyprexa 02/25/22. Will increase Seroquel '50mg'$  bid, adding Lorazepam '1mg'$  q4hr prn x 14 days.   Memory loss Dementia/Disorientation: CT head with CM, No evidence of acute intracranial abnormality or mass. Progressive cerebral atrophy and chronic small vessel ischemic disease.  Weight loss Weight loss: CT abd/pelvis with CM, Underlying neoplasm or duodenal ulcer, Had MRI, underwent GI evaluation, under Hospice service. Abd pain, prn Morphine is available to him.   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 Had nerve conduction study 03/10/18  Osteoarthritis, multiple sites  Left knee OA, R shoulder,  X-ray R hip/knee, f/u Ortho, prn Tylenol, Morphine.   Urinary frequency stable, on Tamsulosin. underwent Urology evaluation in the past.   Constipation Stable, takes Senokot S, MiraLax     Family/ staff Communication: plan of care reviewed with the patient and charge nurse.   Labs/tests ordered:  none  Time spend 35 minutes.

## 2022-06-05 NOTE — Assessment & Plan Note (Signed)
stable, on Tamsulosin. underwent Urology evaluation in the past. 

## 2022-06-09 ENCOUNTER — Encounter: Payer: Self-pay | Admitting: Nurse Practitioner

## 2022-06-09 ENCOUNTER — Non-Acute Institutional Stay (SKILLED_NURSING_FACILITY): Payer: Medicare PPO | Admitting: Nurse Practitioner

## 2022-06-09 DIAGNOSIS — R413 Other amnesia: Secondary | ICD-10-CM

## 2022-06-09 DIAGNOSIS — G6289 Other specified polyneuropathies: Secondary | ICD-10-CM | POA: Diagnosis not present

## 2022-06-09 DIAGNOSIS — F419 Anxiety disorder, unspecified: Secondary | ICD-10-CM | POA: Diagnosis not present

## 2022-06-09 DIAGNOSIS — R350 Frequency of micturition: Secondary | ICD-10-CM

## 2022-06-09 DIAGNOSIS — K5900 Constipation, unspecified: Secondary | ICD-10-CM

## 2022-06-09 DIAGNOSIS — M159 Polyosteoarthritis, unspecified: Secondary | ICD-10-CM

## 2022-06-09 DIAGNOSIS — R634 Abnormal weight loss: Secondary | ICD-10-CM

## 2022-06-09 NOTE — Assessment & Plan Note (Signed)
stable, on Tamsulosin. underwent Urology evaluation in the past. 

## 2022-06-09 NOTE — Assessment & Plan Note (Signed)
Stable, takes Senokot S, MiraLax. 

## 2022-06-09 NOTE — Progress Notes (Signed)
Location:   [facility] [place] Room Number: 16/A Place of Service:  SNF (31) Provider: Lennie Odor Rodolph Hagemann NP  Patsy Varma X, NP  Patient Care Team: Ad Guttman X, NP as PCP - General (Internal Medicine) [name], [name], MD as Consulting Physician (Orthopedic Surgery) [name], MD as Consulting Physician (Gastroenterology) System, Provider Not In [name], MD as Consulting Physician (Neurosurgery) [name], MD as Referring Physician (Ophthalmology) Heavenlee Maiorana X, NP as Nurse Practitioner (Internal Medicine) Almedia Balls, MD as Referring Physician (Orthopedic Surgery) [name], MD as Consulting Physician (Neurosurgery) Elsie Saas, MD as Consulting Physician (Orthopedic Surgery) Allyn Kenner, MD (Dermatology) Kathrynn Ducking, MD (Inactive) as Consulting Physician (Neurology) Franchot Gallo, MD as Consulting Physician (Urology)  Extended Emergency Contact Information Primary Emergency Contact: [name] Address: [address]          [place]          [place], [place] [address] [place] Phone: [phone] Relation: Spouse Secondary Emergency Contact: [place] Phone: [phone] Relation: Relative  Code Status:  DNR  Goals of care: Advanced Directive information    05/16/2022   11:33 AM  Advanced Directives  Does Patient Have a Medical Advance Directive? Yes  Type of Paramedic of [place];Living will;Out of facility DNR (pink MOST or yellow form)  Does patient want to make changes to medical advance directive? No - Patient declined  Copy of [place] in Chart? Yes - validated most recent copy scanned in chart (See row information)  Pre-existing out of facility DNR order (yellow form or pink MOST form) Yellow form placed in chart (order not valid for inpatient use)     Chief Complaint  Patient presents with   Medical Management of Chronic Issues    Routine  Discuss need for Covid and Flu vaccines.    HPI:  Pt is a 88 y.o. male seen today for medical management of chronic diseases.       Weight loss: CT abd/pelvis with CM, Underlying neoplasm or duodenal ulcer, Had MRI, underwent GI evaluation, under Hospice service. Abd pain, prn Morphine is available to him.              Dementia/Disorientation: CT head with CM, No evidence of acute intracranial abnormality or mass. Progressive cerebral atrophy and chronic small vessel ischemic disease. Peripheral neuropathy, weakness in legs, w/c for mobility, on Gabapentin. Had nerve conduction study 03/10/18             Anxiety/depression, on Sertraline, Mirtazapine, Seroquel, prn Lorazepam, intermittent irritable, on prn Hydroxyzine since 04/22/22, dc'd Zyprexa 02/25/22.              Left knee OA, R shoulder,  X-ray R hip/knee, underwent Ortho, prn Tylenol, Morphine.              Urinary frequency, stable, on Tamsulosin. underwent Urology evaluation in the past.              Constipation, takes Senokot S, MiraLax             Allergic rhinitis, takes Fluticasone nasal spray, Fexofenadine.   Past Medical History:  Diagnosis Date   Abnormal MRI, knee    Per [facility] New Patient Packet    Anemia    Anticoagulated    Anxiety    Arthritis    Cervical disc disorder    Per [facility] New Patient Packet    Complication of anesthesia    states "  [place] after surgery"caused by tramadol   Constipation    Dysuria 07/28/2016   Edema 07/24/2016   Gait abnormality 07/25/2016   Hard of hearing    Head injury    As a result of a fall, Per [facility] New Patient Packet    Hip arthritis 07/21/2016   History of fall 07/25/2016   Hypercholesteremia    Hypertension    Lumbar spinal stenosis    Memory loss 07/28/2016   mild   Obesity    Per [facility] New Patient Packet    Osteoarthritis of right hip 07/24/2016   Peripheral neuropathy 03/10/2018   Sleep apnea    cpap   Spinal stenosis of lumbar region  05/28/2016   Urinary frequency    Venous thrombosis of leg 07/24/2016   right gastrocnemius   Vertigo    Wears glasses    Weight loss 04/08/2016   Past Surgical History:  Procedure Laterality Date   BACK SURGERY  10/17, 80's   COLONOSCOPY     COLONOSCOPY     Per [facility] New Patient Packet, Dr.Schooler    ESOPHAGOGASTRODUODENOSCOPY     LUMBAR LAMINECTOMY/DECOMPRESSION MICRODISCECTOMY Right 05/28/2016   Procedure: Right Lumbar Three-Four Microdiscectomy;  Surgeon: [name], MD;  Location: [place] NEURO ORS;  Service: Neurosurgery;  Laterality: Right;   TOTAL HIP ARTHROPLASTY Right 07/21/2016   Procedure: TOTAL HIP ARTHROPLASTY ANTERIOR APPROACH;  Surgeon: [name], MD;  Location: [place];  Service: Orthopedics;  Laterality: Right;    Allergies  Allergen Reactions   Tramadol Other (See Comments)    "does not eat" LOSS OF APPETITE    Allergies as of 06/09/2022       Reactions   Tramadol Other (See Comments)   "does not eat" LOSS OF APPETITE        Medication List        Accurate as of June 09, 2022  4:46 PM. If you have any questions, ask your nurse or doctor.          acetaminophen 500 MG tablet Commonly known as: TYLENOL Take 1,000 mg by mouth daily as needed for mild pain.   Biofreeze 4 % Gel Generic drug: Menthol (Topical Analgesic) Apply 1 Application topically in the morning, at noon, in the evening, and at bedtime. Apply to right shoulder   DYNADERM HYDROCOLLOID 4"X4" EX Apply topically every 3 (three) days. Cleanse dime-sized area on right buttocks with NSS, pat dry, apply 2X2 dressing, change every 3 days.   fexofenadine 180 MG tablet Commonly known as: ALLEGRA Take 180 mg by mouth daily.   fluticasone 50 MCG/ACT nasal spray Commonly known as: FLONASE Place 2 sprays into both nostrils daily.   gabapentin 100 MG capsule Commonly known as: NEURONTIN Take 200 mg by mouth at bedtime. 8pm.   hydrocortisone 2.5 % cream Apply 1 Application  topically 2 (two) times daily as needed (for facial rash).   hydrOXYzine 10 MG tablet Commonly known as: ATARAX Take 10 mg by mouth 2 (two) times daily as needed (for agitation).   meclizine 25 MG tablet Commonly known as: ANTIVERT Take 25 mg by mouth 3 (three) times daily as needed for dizziness.   mirtazapine 7.5 MG tablet Commonly known as: REMERON Take 7.5 mg by mouth at bedtime.   morphine 10 MG/5ML solution Take 1.3 mLs (2.6 mg total) by mouth every 2 (two) hours as needed for severe pain.   NAT-RUL PSYLLIUM SEED HUSKS PO Take 1 tablet by  mouth daily. For constipation   nystatin powder Commonly known as: MYCOSTATIN/NYSTOP Apply 1 application  topically See admin instructions. Apply to periwound and groin every other day as needed   polyethylene glycol 17 g packet Commonly known as: MIRALAX / GLYCOLAX Take 17 g by mouth daily.   promethazine 25 MG suppository Commonly known as: PHENERGAN Place 25 mg rectally every 6 (six) hours as needed for nausea or vomiting.   QUEtiapine 25 MG tablet Commonly known as: SEROQUEL Take 50 mg by mouth 2 (two) times daily.   Resource 2.0 Liqd Take 180 mLs by mouth in the morning and at bedtime.   sennosides-docusate sodium 8.6-50 MG tablet Commonly known as: SENOKOT-S Take 2 tablets by mouth daily.   sertraline 25 MG tablet Commonly known as: ZOLOFT Take 1 tablet (25 mg total) by mouth daily.   tamsulosin 0.4 MG Caps capsule Commonly known as: FLOMAX Take 0.4 mg by mouth at bedtime.        Review of Systems  Unable to perform ROS: Dementia    Immunization History  Administered Date(s) Administered   Influenza-Unspecified 06/11/2015, 06/11/2019, 06/19/2020, 06/27/2021   Moderna Sars-Covid-2 Vaccination 09/10/2019, 10/08/2019, 07/17/2020, 02/05/2021   PFIZER(Purple Top)SARS-COV-2 Vaccination 09/10/2019   Pneumococcal Conjugate-13 09/12/2014   Pneumococcal Polysaccharide-23 04/21/2011   Pneumococcal-Unspecified  04/21/2019   Td 04/22/2002   Tetanus 05/16/2020   Unspecified SARS-COV-2 Vaccination 05/28/2021   Zoster Recombinat (Shingrix) 09/14/2021, 11/25/2021   Zoster, Live 04/27/2012   Pertinent  Health Maintenance Due  Topic Date Due   INFLUENZA VACCINE  04/08/2022      03/24/2019    3:10 PM 05/11/2022   10:25 PM 05/12/2022    3:39 AM 05/12/2022    9:00 PM 05/13/2022    2:00 PM  [place] in the past year? 0      Was there an injury with Fall? 0      Fall Risk Category Calculator 0      Fall Risk Category Low      Patient Fall Risk Level Low fall risk Low fall risk High fall risk High fall risk High fall risk   Functional Status Survey:    Vitals:   06/09/22 1618  BP: 108/62  Pulse: 76  Resp: 18  Temp: (!) 97.5 F (36.4 C)  SpO2: 95%  Weight: 161 lb (73 kg)  Height: '5\' 9"'$  (1.753 m)   Body mass index is 23.78 kg/m. Physical Exam Vitals and nursing note reviewed.  Constitutional:      Comments: frail  HENT:     Head: Normocephalic and atraumatic.     Mouth/Throat:     Mouth: Mucous membranes are moist.  Eyes:     Extraocular Movements: Extraocular movements intact.     Conjunctiva/sclera: Conjunctivae normal.     Pupils: Pupils are equal, round, and reactive to light.  Cardiovascular:     Rate and Rhythm: Normal rate and regular rhythm.     Heart sounds: No murmur heard. Pulmonary:     Effort: Pulmonary effort is normal.     Breath sounds: No rales.  Abdominal:     General: Bowel sounds are normal.     Palpations: Abdomen is soft.     Tenderness: There is no abdominal tenderness.  Musculoskeletal:        General: Tenderness present.     Cervical back: Normal range of motion and neck supple.     Right lower leg: No edema.     Left lower leg:  No edema.     Comments: R shoulder pain with ROM is chronic. Chronic R+L knee pain, knee support L knee. S/p R hips replacement. S/p cervical/lumbar spine surgeries.   Skin:    General: Skin is warm and dry.   Neurological:     General: No focal deficit present.     Mental Status: He is alert. Mental status is at baseline.     Motor: Weakness present.     Gait: Gait abnormal.     Comments: Moves slow, uses w/c for mobility,  lower body weakness, peripheral neuropathy in legs.   Psychiatric:     Comments: Flat affect     Labs reviewed: Recent Labs    04/21/22 0000 05/11/22 2255 05/12/22 0444  NA 141 139 139  K 4.1 4.0 3.7  CL 105 103 103  CO2 30* 21* 28  GLUCOSE  --  132* 126*  BUN '15 14 14  '$ CREATININE 0.8 0.88 0.88  CALCIUM 8.4* 8.4* 8.7*   Recent Labs    04/21/22 0000 05/11/22 2255 05/12/22 0444  AST '24 26 21  '$ ALT '16 14 16  '$ ALKPHOS 87 85 80  BILITOT  --  1.5* 1.3*  PROT  --  6.3* 6.2*  ALBUMIN 3.1* 2.8* 2.8*   Recent Labs    03/15/22 0000 05/11/22 2255 05/12/22 0444  WBC 6.7 13.3* 10.0  NEUTROABS 5,038.00 11.5* 8.4*  HGB 12.8* 12.8* 12.2*  HCT 41 40.1 38.3*  MCV  --  90.7 89.3  PLT 280 250 263   Lab Results  Component Value Date   TSH 0.98 02/06/2022   No results found for: "HGBA1C" Lab Results  Component Value Date   CHOL 114 05/31/2021   HDL 25 (A) 05/31/2021   LDLCALC 66 05/31/2021   TRIG 149 05/31/2021   CHOLHDL 5.1 (H) 03/31/2019    Significant Diagnostic Results in last 30 days:  CT Abdomen Pelvis W Contrast  Result Date: 05/12/2022 CLINICAL DATA:  Abdominal pain. EXAM: CT ABDOMEN AND PELVIS WITH CONTRAST TECHNIQUE: Multidetector CT imaging of the abdomen and pelvis was performed using the standard protocol following bolus administration of intravenous contrast. RADIATION DOSE REDUCTION: This exam was performed according to the departmental dose-optimization program which includes automated exposure control, adjustment of the mA and/or kV according to patient size and/or use of iterative reconstruction technique. CONTRAST:  167m OMNIPAQUE IOHEXOL 300 MG/ML  SOLN COMPARISON:  CT abdomen pelvis dated 04/11/2022. FINDINGS: Lower chest: Partially  visualized small right pleural effusion. There is a small left lung base calcified granuloma. There is advanced coronary vascular calcification. No intra-abdominal free air or free fluid. Hepatobiliary: The liver is unremarkable. There is mild biliary ductal dilatation or periportal edema. The gallbladder is contracted and appears inflamed. The gallbladder extends superiorly to the level of the diaphragm. There is fusion of the gallbladder fundus to the right diaphragm. Small pockets of air adjacent to the gallbladder fundus may be within the lumen of the gallbladder or represent focally contained perforation. Pancreas: No acute findings.  No inflammatory changes. Spleen: Normal in size without focal abnormality. Adrenals/Urinary Tract: The adrenal glands unremarkable several left renal cysts. There is no hydronephrosis on either side. There is symmetric enhancement and excretion of contrast by both kidneys. The visualized ureters and urinary bladder appear unremarkable. Stomach/Bowel: There is a moderate size hiatal hernia. Thickened and inflamed appearance of the second portion of the duodenum with surrounding stranding. Overall decrease in the degree of inflammation compared to prior CT. Underlying mass  is not excluded. Further evaluation with endoscopy, as previously recommended and if not yet performed, advised. There is focal area of thickening of the medial wall of the distal ascending colon (25/3 and coronal 68/5), likely reactive to inflammatory changes of the duodenum. There is moderate amount of stool throughout the colon. There is no bowel obstruction. The appendix is normal. Vascular/Lymphatic: Moderate aortoiliac atherosclerotic disease. The IVC is unremarkable. No portal venous gas. There is no adenopathy. Reproductive: The prostate and seminal vesicles are grossly remarkable. Other: None Musculoskeletal: Osteopenia with degenerative changes of the spine. Total right hip arthroplasty. Similar  appearance of L1 compression fracture and retropulsion of the posterior cortex as seen on the prior CT. No new fracture. IMPRESSION: 1. Thickened and inflamed appearance of the second portion of the duodenum with surrounding stranding. Overall decrease in the degree of inflammation compared to prior CT. Underlying mass is not excluded. Further evaluation with endoscopy, as previously recommended and if not yet performed, advised. 2. Contracted and inflamed gallbladder. There is adhesion of the gallbladder fundus to the right hemidiaphragm. Small pockets of air adjacent to the gallbladder fundus may be within the lumen of the gallbladder or represent focally contained perforation. 3. Moderate size hiatal hernia. 4. Partially visualized small right pleural effusion. 5. Similar appearance of L1 compression fracture and retropulsion of the posterior cortex as seen on the prior CT. 6. Aortic Atherosclerosis (ICD10-I70.0). Electronically Signed   By: Anner Crete M.D.   On: 05/12/2022 00:30    Assessment/Plan  Memory loss CT head with CM, No evidence of acute intracranial abnormality or mass. Progressive cerebral atrophy and chronic small vessel ischemic disease.  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 Had nerve conduction study 03/10/18  Anxiety on Sertraline, Mirtazapine, Seroquel, prn Lorazepam, intermittent irritable, on prn Hydroxyzine since 04/22/22, dc'd Zyprexa 02/25/22.   Osteoarthritis, multiple sites underwent Ortho, prn Tylenol, Morphine.   Urinary frequency stable, on Tamsulosin. underwent Urology evaluation in the past.   Constipation Stable, takes Senokot S, MiraLax  Weight loss CT abd/pelvis with CM, Underlying neoplasm or duodenal ulcer, Had MRI, underwent GI evaluation, under Hospice service. Abd pain, prn Morphine is available to him.    Family/ staff Communication: plan of care reviewed with the patient and charge nurse.    Labs/tests ordered:  none  Time spend 25 minutes.

## 2022-06-09 NOTE — Assessment & Plan Note (Addendum)
underwent Ortho, prn Tylenol, Morphine.

## 2022-06-09 NOTE — Assessment & Plan Note (Signed)
CT head with CM, No evidence of acute intracranial abnormality or mass. Progressive cerebral atrophy and chronic small vessel ischemic disease. 

## 2022-06-09 NOTE — Assessment & Plan Note (Signed)
CT abd/pelvis with CM, Underlying neoplasm or duodenal ulcer, Had MRI, underwent GI evaluation, under Hospice service. Abd pain, prn Morphine is available to him.

## 2022-06-09 NOTE — Assessment & Plan Note (Signed)
weakness in legs, w/c for mobility, on Gabapentin. Had nerve conduction study 03/10/18 

## 2022-06-09 NOTE — Assessment & Plan Note (Signed)
on Sertraline, Mirtazapine, Seroquel, prn Lorazepam, intermittent irritable, on prn Hydroxyzine since 04/22/22, dc'd Zyprexa 02/25/22.  

## 2022-06-10 ENCOUNTER — Encounter: Payer: Self-pay | Admitting: Nurse Practitioner

## 2022-06-10 NOTE — Progress Notes (Signed)
Location:   [place] of Service:  SNF (31)  Provider:  Mast, Man X, NP  Patient Care Team: Mast, Man X, NP as PCP - General (Internal Medicine) [name], [name], MD as Consulting Physician (Orthopedic Surgery) [name], MD as Consulting Physician (Gastroenterology) System, Provider Not In [name], MD as Consulting Physician (Neurosurgery) [name], MD as Referring Physician (Ophthalmology) Mast, Man X, NP as Nurse Practitioner (Internal Medicine) Almedia Balls, MD as Referring Physician (Orthopedic Surgery) [name], MD as Consulting Physician (Neurosurgery) Elsie Saas, MD as Consulting Physician (Orthopedic Surgery) Allyn Kenner, MD (Dermatology) Kathrynn Ducking, MD (Inactive) as Consulting Physician (Neurology) Franchot Gallo, MD as Consulting Physician (Urology)  Extended Emergency Contact Information Primary Emergency Contact: [name] Address: [address]          [place] [address]          [place], [place] [address] [place] Phone: [phone] Relation: Spouse Secondary Emergency Contact: [place] Mobile Phone: [phone] Relation: Relative  Code Status:   Goals of care: Advanced Directive information    05/16/2022   11:33 AM  Advanced Directives  Does Patient Have a Medical Advance Directive? Yes  Type of Paramedic of [place];Living will;Out of facility DNR (pink MOST or yellow form)  Does patient want to make changes to medical advance directive? No - Patient declined  Copy of [place] in Chart? Yes - validated most recent copy scanned in chart (See row information)  Pre-existing out of facility DNR order (yellow form or pink MOST form) Yellow form placed in chart (order not valid for inpatient use)     Chief Complaint  Patient presents with   Medical Management of Chronic Issues    Routine    HPI:   Pt is a 88 y.o. male seen today for medical management of chronic diseases.     Past Medical History:  Diagnosis Date   Abnormal MRI, knee    Per [facility] New Patient Packet    Anemia    Anticoagulated    Anxiety    Arthritis    Cervical disc disorder    Per [facility] New Patient Packet    Complication of anesthesia    states "[place] was in [place] weeks after surgery"caused by tramadol   Constipation    Dysuria 07/28/2016   Edema 07/24/2016   Gait abnormality 07/25/2016   Hard of hearing    Head injury    As a result of a fall, Per [facility] New Patient Packet    Hip arthritis 07/21/2016   History of fall 07/25/2016   Hypercholesteremia    Hypertension    Lumbar spinal stenosis    Memory loss 07/28/2016   mild   Obesity    Per [facility] New Patient Packet    Osteoarthritis of right hip 07/24/2016   Peripheral neuropathy 03/10/2018   Sleep apnea    cpap   Spinal stenosis of lumbar region 05/28/2016   Urinary frequency    Venous thrombosis of leg 07/24/2016   right gastrocnemius   Vertigo    Wears glasses    Weight loss 04/08/2016   Past Surgical History:  Procedure Laterality Date   BACK SURGERY  10/17, 80's   COLONOSCOPY     COLONOSCOPY     Per [facility] New Patient Packet, Dr.Schooler    ESOPHAGOGASTRODUODENOSCOPY     LUMBAR LAMINECTOMY/DECOMPRESSION MICRODISCECTOMY Right 05/28/2016   Procedure:  Right Lumbar Three-Four Microdiscectomy;  Surgeon: [name], MD;  Location: [facility];  Service: Neurosurgery;  Laterality: Right;   TOTAL HIP ARTHROPLASTY Right 07/21/2016   Procedure: TOTAL HIP ARTHROPLASTY ANTERIOR APPROACH;  Surgeon: [name], MD;  Location: [facility];  Service: Orthopedics;  Laterality: Right;    Allergies  Allergen Reactions   Tramadol Other (See Comments)    "does not eat" LOSS OF APPETITE    Allergies as of 06/09/2022       Reactions   Tramadol Other (See Comments)   "does not eat" LOSS OF APPETITE        Medication List         Accurate as of June 09, 2022 11:59 PM. If you have any questions, ask your nurse or doctor.          acetaminophen 500 MG tablet Commonly known as: TYLENOL Take 1,000 mg by mouth daily as needed for mild pain.   Biofreeze 4 % Gel Generic drug: Menthol (Topical Analgesic) Apply 1 Application topically in the morning, at noon, in the evening, and at bedtime. Apply to right shoulder   DYNADERM HYDROCOLLOID 4"X4" EX Apply topically every 3 (three) days. Cleanse dime-sized area on right buttocks with NSS, pat dry, apply 2X2 dressing, change every 3 days.   fexofenadine 180 MG tablet Commonly known as: ALLEGRA Take 180 mg by mouth daily.   fluticasone 50 MCG/ACT nasal spray Commonly known as: FLONASE Place 2 sprays into both nostrils daily.   gabapentin 100 MG capsule Commonly known as: NEURONTIN Take 200 mg by mouth at bedtime. 8pm.   hydrocortisone 2.5 % cream Apply 1 Application topically 2 (two) times daily as needed (for facial rash).   hydrOXYzine 10 MG tablet Commonly known as: ATARAX Take 10 mg by mouth 2 (two) times daily as needed (for agitation).   meclizine 25 MG tablet Commonly known as: ANTIVERT Take 25 mg by mouth 3 (three) times daily as needed for dizziness.   mirtazapine 7.5 MG tablet Commonly known as: REMERON Take 7.5 mg by mouth at bedtime.   morphine 10 MG/5ML solution Take 1.3 mLs (2.6 mg total) by mouth every 2 (two) hours as needed for severe pain.   NAT-RUL PSYLLIUM SEED HUSKS PO Take 1 tablet by mouth daily. For constipation   nystatin powder Commonly known as: MYCOSTATIN/NYSTOP Apply 1 application  topically See admin instructions. Apply to periwound and groin every other day as needed   polyethylene glycol 17 g packet Commonly known as: MIRALAX / GLYCOLAX Take 17 g by mouth daily.   promethazine 25 MG suppository Commonly known as: PHENERGAN Place 25 mg rectally every 6 (six) hours as needed for nausea or vomiting.   QUEtiapine 25  MG tablet Commonly known as: SEROQUEL Take 50 mg by mouth 2 (two) times daily.   Resource 2.0 Liqd Take 180 mLs by mouth in the morning and at bedtime.   sennosides-docusate sodium 8.6-50 MG tablet Commonly known as: SENOKOT-S Take 2 tablets by mouth daily.   sertraline 25 MG tablet Commonly known as: ZOLOFT Take 1 tablet (25 mg total) by mouth daily.   tamsulosin 0.4 MG Caps capsule Commonly known as: FLOMAX Take 0.4 mg by mouth at bedtime.        Review of Systems  Immunization History  Administered Date(s) Administered   Influenza-Unspecified 06/11/2015, 06/11/2019, 06/19/2020, 06/27/2021   Moderna Sars-Covid-2 Vaccination 09/10/2019, 10/08/2019, 07/17/2020, 02/05/2021   PFIZER(Purple Top)SARS-COV-2 Vaccination 09/10/2019   Pneumococcal Conjugate-13 09/12/2014  Pneumococcal Polysaccharide-23 04/21/2011   Pneumococcal-Unspecified 04/21/2019   Td 04/22/2002   Tetanus 05/16/2020   Unspecified SARS-COV-2 Vaccination 05/28/2021   Zoster Recombinat (Shingrix) 09/14/2021, 11/25/2021   Zoster, Live 04/27/2012   Pertinent  Health Maintenance Due  Topic Date Due   INFLUENZA VACCINE  04/08/2022      03/24/2019    3:10 PM 05/11/2022   10:25 PM 05/12/2022    3:39 AM 05/12/2022    9:00 PM 05/13/2022    2:00 PM  [place] in the past year? 0      Was there an injury with Fall? 0      Fall Risk Category Calculator 0      Fall Risk Category Low      Patient Fall Risk Level Low fall risk Low fall risk High fall risk High fall risk High fall risk   Functional Status Survey:    Vitals:   06/10/22 1420  BP: 108/62  Pulse: 76  Resp: 18  Temp: (!) 97.5 F (36.4 C)  SpO2: 95%  Weight: 161 lb (73 kg)  Height: '5\' 9"'$  (1.753 m)   Body mass index is 23.78 kg/m. Physical Exam  Labs reviewed: Recent Labs    04/21/22 0000 05/11/22 2255 05/12/22 0444  NA 141 139 139  K 4.1 4.0 3.7  CL 105 103 103  CO2 30* 21* 28  GLUCOSE  --  132* 126*  BUN '15 14 14  '$ CREATININE  0.8 0.88 0.88  CALCIUM 8.4* 8.4* 8.7*   Recent Labs    04/21/22 0000 05/11/22 2255 05/12/22 0444  AST '24 26 21  '$ ALT '16 14 16  '$ ALKPHOS 87 85 80  BILITOT  --  1.5* 1.3*  PROT  --  6.3* 6.2*  ALBUMIN 3.1* 2.8* 2.8*   Recent Labs    03/15/22 0000 05/11/22 2255 05/12/22 0444  WBC 6.7 13.3* 10.0  NEUTROABS 5,038.00 11.5* 8.4*  HGB 12.8* 12.8* 12.2*  HCT 41 40.1 38.3*  MCV  --  90.7 89.3  PLT 280 250 263   Lab Results  Component Value Date   TSH 0.98 02/06/2022   No results found for: "HGBA1C" Lab Results  Component Value Date   CHOL 114 05/31/2021   HDL 25 (A) 05/31/2021   LDLCALC 66 05/31/2021   TRIG 149 05/31/2021   CHOLHDL 5.1 (H) 03/31/2019    Significant Diagnostic Results in last 30 days:  CT Abdomen Pelvis W Contrast  Result Date: 05/12/2022 CLINICAL DATA:  Abdominal pain. EXAM: CT ABDOMEN AND PELVIS WITH CONTRAST TECHNIQUE: Multidetector CT imaging of the abdomen and pelvis was performed using the standard protocol following bolus administration of intravenous contrast. RADIATION DOSE REDUCTION: This exam was performed according to the departmental dose-optimization program which includes automated exposure control, adjustment of the mA and/or kV according to patient size and/or use of iterative reconstruction technique. CONTRAST:  168m OMNIPAQUE IOHEXOL 300 MG/ML  SOLN COMPARISON:  CT abdomen pelvis dated 04/11/2022. FINDINGS: Lower chest: Partially visualized small right pleural effusion. There is a small left lung base calcified granuloma. There is advanced coronary vascular calcification. No intra-abdominal free air or free fluid. Hepatobiliary: The liver is unremarkable. There is mild biliary ductal dilatation or periportal edema. The gallbladder is contracted and appears inflamed. The gallbladder extends superiorly to the level of the diaphragm. There is fusion of the gallbladder fundus to the right diaphragm. Small pockets of air adjacent to the gallbladder fundus  may be within the lumen of the gallbladder or  represent focally contained perforation. Pancreas: No acute findings.  No inflammatory changes. Spleen: Normal in size without focal abnormality. Adrenals/Urinary Tract: The adrenal glands unremarkable several left renal cysts. There is no hydronephrosis on either side. There is symmetric enhancement and excretion of contrast by both kidneys. The visualized ureters and urinary bladder appear unremarkable. Stomach/Bowel: There is a moderate size hiatal hernia. Thickened and inflamed appearance of the second portion of the duodenum with surrounding stranding. Overall decrease in the degree of inflammation compared to prior CT. Underlying mass is not excluded. Further evaluation with endoscopy, as previously recommended and if not yet performed, advised. There is focal area of thickening of the medial wall of the distal ascending colon (25/3 and coronal 68/5), likely reactive to inflammatory changes of the duodenum. There is moderate amount of stool throughout the colon. There is no bowel obstruction. The appendix is normal. Vascular/Lymphatic: Moderate aortoiliac atherosclerotic disease. The IVC is unremarkable. No portal venous gas. There is no adenopathy. Reproductive: The prostate and seminal vesicles are grossly remarkable. Other: None Musculoskeletal: Osteopenia with degenerative changes of the spine. Total right hip arthroplasty. Similar appearance of L1 compression fracture and retropulsion of the posterior cortex as seen on the prior CT. No new fracture. IMPRESSION: 1. Thickened and inflamed appearance of the second portion of the duodenum with surrounding stranding. Overall decrease in the degree of inflammation compared to prior CT. Underlying mass is not excluded. Further evaluation with endoscopy, as previously recommended and if not yet performed, advised. 2. Contracted and inflamed gallbladder. There is adhesion of the gallbladder fundus to the right  hemidiaphragm. Small pockets of air adjacent to the gallbladder fundus may be within the lumen of the gallbladder or represent focally contained perforation. 3. Moderate size hiatal hernia. 4. Partially visualized small right pleural effusion. 5. Similar appearance of L1 compression fracture and retropulsion of the posterior cortex as seen on the prior CT. 6. Aortic Atherosclerosis (ICD10-I70.0). Electronically Signed   By: Anner Crete M.D.   On: 05/12/2022 00:30    Assessment/Plan There are no diagnoses linked to this encounter.   Family/ staff Communication:   Labs/tests ordered:

## 2022-06-10 NOTE — Telephone Encounter (Signed)
Medication refilled by PCP Mast, Man X, NP .

## 2022-06-13 NOTE — Progress Notes (Signed)
This encounter was created in error - please disregard.

## 2022-06-22 ENCOUNTER — Telehealth: Payer: Self-pay | Admitting: Adult Health

## 2022-06-22 NOTE — Telephone Encounter (Signed)
Nurse called to report resident is positive for covid by rapid swab. Symptoms are dry cough and low 02 sat 88%. He normally runs 90-91 per nurse. He is not in acute distress. She is trying to apply oxygen but due to his dementia she is not sure if it will stay on. She says she called hospice and they told her to call [facility] to see if we wanted to treat for covid. I am not sure if his goals of care would align with this notion. He is not able to swallow pills whole. I let her know we could try molnupiravir 800 mg bid x 5 days if this is acceptable to his family. She will reach out to them. He will be placed on isolation per protocol.

## 2022-07-15 ENCOUNTER — Non-Acute Institutional Stay (SKILLED_NURSING_FACILITY): Payer: Medicare PPO | Admitting: Nurse Practitioner

## 2022-07-15 ENCOUNTER — Encounter: Payer: Self-pay | Admitting: Nurse Practitioner

## 2022-07-15 DIAGNOSIS — R935 Abnormal findings on diagnostic imaging of other abdominal regions, including retroperitoneum: Secondary | ICD-10-CM | POA: Diagnosis not present

## 2022-07-15 DIAGNOSIS — G6289 Other specified polyneuropathies: Secondary | ICD-10-CM

## 2022-07-15 DIAGNOSIS — R350 Frequency of micturition: Secondary | ICD-10-CM

## 2022-07-15 DIAGNOSIS — F01C11 Vascular dementia, severe, with agitation: Secondary | ICD-10-CM

## 2022-07-15 DIAGNOSIS — K5900 Constipation, unspecified: Secondary | ICD-10-CM

## 2022-07-15 DIAGNOSIS — F419 Anxiety disorder, unspecified: Secondary | ICD-10-CM

## 2022-07-15 DIAGNOSIS — M159 Polyosteoarthritis, unspecified: Secondary | ICD-10-CM

## 2022-07-15 NOTE — Assessment & Plan Note (Signed)
on Sertraline, Mirtazapine, Seroquel, prn Lorazepam, intermittent irritable, on prn Hydroxyzine since 04/22/22, dc'd Zyprexa 02/25/22.

## 2022-07-15 NOTE — Assessment & Plan Note (Signed)
CT head with CM, No evidence of acute intracranial abnormality or mass. Progressive cerebral atrophy and chronic small vessel ischemic disease.

## 2022-07-15 NOTE — Assessment & Plan Note (Signed)
weakness in legs, w/c for mobility, on Gabapentin. Had nerve conduction study 03/10/18

## 2022-07-15 NOTE — Assessment & Plan Note (Signed)
Weight loss: CT abd/pelvis with CM, Underlying neoplasm or duodenal ulcer, Had MRI, underwent GI evaluation, under Hospice service. Abd pain, prn Morphine is available to him.

## 2022-07-15 NOTE — Progress Notes (Signed)
Location:  [facility] Room Number: NO/16/A Place of Service:  SNF (31) Provider:  Ayyan Sites X, NP  Patient Care Team: Aiko Belko X, NP as PCP - General (Internal Medicine) [name], [name], MD as Consulting Physician (Orthopedic Surgery) [name], MD as Consulting Physician (Gastroenterology) System, Provider Not In [name], MD as Consulting Physician (Neurosurgery) [name], MD as Referring Physician (Ophthalmology) Yesennia Hirota X, NP as Nurse Practitioner (Internal Medicine) Almedia Balls, MD as Referring Physician (Orthopedic Surgery) [name], MD as Consulting Physician (Neurosurgery) Elsie Saas, MD as Consulting Physician (Orthopedic Surgery) Allyn Kenner, MD (Inactive) (Dermatology) Kathrynn Ducking, MD (Inactive) as Consulting Physician (Neurology) Franchot Gallo, MD as Consulting Physician (Urology)  Extended Emergency Contact Information Primary Emergency Contact: [name] Address: [address]          [place], [place] [address] [place] Phone: [phone] Relation: Spouse Secondary Emergency Contact: [place] Mobile Phone: [phone] Relation: Relative  Code Status:  DNR Goals of care: Advanced Directive information    07/18/2022    1:04 PM  Advanced Directives  Does Patient Have a Medical Advance Directive? Yes  Type of Paramed[place] of [place];Living will;Out of facility DNR (pink MOST or yellow form)  Does patient want to make changes to medical advance directive? No - Patient declined  Copy of [place] in Chart? Yes - validated most recent copy scanned in chart (See row information)  Pre-existing out of facility DNR order (yellow form or pink MOST form) Yellow form placed in chart (order not valid for inpatient use)     Chief Complaint  Patient presents with   Medical Management of Chronic Issues     Patient is here for a follow up for chronic conditions    Quality Metric Gaps    Patient due for updated covid and annual flu vaccine    HPI:  Pt is a 88 y.o. male seen today for medical management of chronic diseases.       Weight loss: CT abd/pelvis with CM, Underlying neoplasm or duodenal ulcer, Had MRI, underwent GI evaluation, under Hospice service. Abd pain, prn Morphine is available to him.              Dementia/Disorientation: CT head with CM, No evidence of acute intracranial abnormality or mass. Progressive cerebral atrophy and chronic small vessel ischemic disease. Peripheral neuropathy, weakness in legs, w/c for mobility, on Gabapentin. Had nerve conduction study 03/10/18             Anxiety/depression, on Sertraline, Mirtazapine, Seroquel, prn Lorazepam, intermittent irritable, on prn Hydroxyzine since 04/22/22, dc'd Zyprexa 02/25/22.              Left knee OA, R shoulder,  X-ray R hip/knee, underwent Ortho, prn Tylenol, Morphine.              Urinary frequency, stable, on Tamsulosin. underwent Urology evaluation in the past.              Constipation, takes Senokot S, MiraLax             Allergic rhinitis, takes Fluticasone nasal spray, Fexofenadine.   Past Medical History:  Diagnosis Date   Abnormal MRI, knee    Per [facility] New Patient Packet    Anemia    Anticoagulated    Anxiety    Arthritis    Cervical disc disorder  Per [facility] New Patient Packet    Complication of anesthesia    states "[place] was [place] weeks after surgery"caused by tramadol   Constipation    Dysuria 07/28/2016   Edema 07/24/2016   Gait abnormality 07/25/2016   Hard of hearing    Head injury    As a result of a fall, Per [facility] New Patient Packet    Hip arthritis 07/21/2016   History of fall 07/25/2016   Hypercholesteremia    Hypertension    Lumbar spinal stenosis    Memory loss 07/28/2016   mild   Obesity    Per [facility] New Patient Packet    Osteoarthritis of right hip 07/24/2016    Peripheral neuropathy 03/10/2018   Sleep apnea    cpap   Spinal stenosis of lumbar region 05/28/2016   Urinary frequency    Venous thrombosis of leg 07/24/2016   right gastrocnemius   Vertigo    Wears glasses    Weight loss 04/08/2016   Past Surgical History:  Procedure Laterality Date   BACK SURGERY  10/17, 80's   COLONOSCOPY     COLONOSCOPY     Per [facility] New Patient Packet, Dr.Schooler    ESOPHAGOGASTRODUODENOSCOPY     LUMBAR LAMINECTOMY/DECOMPRESSION MICRODISCECTOMY Right 05/28/2016   Procedure: Right Lumbar Three-Four Microdiscectomy;  Surgeon: [name], MD;  Location: [facility] NEUR[facility] ORS;  Service: Neurosurgery;  Laterality: Right;   TOTAL HIP ARTHROPLASTY Right 07/21/2016   Procedure: TOTAL HIP ARTHROPLASTY ANTERIOR APPROACH;  Surgeon: [name], MD;  Location: [facility];  Service: Orthopedics;  Laterality: Right;    Allergies  Allergen Reactions   Tramadol Other (See Comments)    "does not eat" LOSS OF APPETITE    Outpatient Encounter Medications as of 07/15/2022  Medication Sig   acetaminophen (TYLENOL) 500 MG tablet Take 1,000 mg by mouth daily as needed for mild pain.   fexofenadine (ALLEGRA) 180 MG tablet Take 180 mg by mouth daily.   fluticasone (FLONASE) 50 MCG/ACT nasal spray Place 2 sprays into both nostrils daily.   gabapentin (NEURONTIN) 100 MG capsule Take 200 mg by mouth at bedtime. 8pm.   Hydroactive Dressings (DYNADERM HYDROCOLLOID 4"X4" EX) Apply topically every 3 (three) days. Cleanse dime-sized area on right buttocks with NSS, pat dry, apply 2X2 dressing, change every 3 days.   hydrocortisone 2.5 % cream Apply 1 Application topically 2 (two) times daily as needed (for facial rash).   hydrOXYzine (ATARAX) 10 MG tablet Take 10 mg by mouth 2 (two) times daily as needed (for agitation).   meclizine (ANTIVERT) 25 MG tablet Take 25 mg by mouth 3 (three) times daily as needed for dizziness.   Menthol, Topical Analgesic, (BIOFREEZE) 4 % GEL Apply 1 Application  topically in the morning, at noon, in the evening, and at bedtime. Apply to right shoulder   mirtazapine (REMERON) 7.5 MG tablet Take 7.5 mg by mouth at bedtime.   morphine 10 MG/5ML solution Take 1.3 mLs (2.6 mg total) by mouth every 2 (two) hours as needed for severe pain.   NAT-RUL PSYLLIUM SEED HUSKS PO Take 1 tablet by mouth daily. For constipation   Nutritional Supplements (RESOURCE 2.0) LIQD Take 180 mLs by mouth in the morning and at bedtime.   nystatin (MYCOSTATIN/NYSTOP) powder Apply 1 application  topically See admin instructions. Apply to periwound and groin every other day as needed   polyethylene glycol (MIRALAX / GLYCOLAX) 17 g packet Take 17 g by mouth daily.   promethazine (  PHENERGAN) 25 MG suppository Place 25 mg rectally every 6 (six) hours as needed for nausea or vomiting.   QUEtiapine (SEROQUEL) 25 MG tablet Take 50 mg by mouth 2 (two) times daily.   sennosides-docusate sodium (SENOKOT-S) 8.6-50 MG tablet Take 2 tablets by mouth daily.   sertraline (ZOLOFT) 25 MG tablet Take 1 tablet (25 mg total) by mouth daily.   tamsulosin (FLOMAX) 0.4 MG CAPS capsule Take 0.4 mg by mouth at bedtime.   No facility-administered encounter medications on file as of 07/15/2022.    Review of Systems  Unable to perform ROS: Dementia    Immunization History  Administered Date(s) Administered   Influenza-Unspecified 06/11/2015, 06/11/2019, 06/19/2020, 06/27/2021   Moderna Sars-Covid-2 Vaccination 09/10/2019, 10/08/2019, 07/17/2020, 02/05/2021   PFIZER(Purple Top)SARS-COV-2 Vaccination 09/10/2019   Pneumococcal Conjugate-13 09/12/2014   Pneumococcal Polysaccharide-23 04/21/2011   Pneumococcal-Unspecified 04/21/2019   Td 04/22/2002   Tetanus 05/16/2020   Unspecified SARS-COV-2 Vaccination 05/28/2021   Zoster Recombinat (Shingrix) 09/14/2021, 11/25/2021   Zoster, Live 04/27/2012   Pertinent  Health Maintenance Due  Topic Date Due   INFLUENZA VACCINE  04/08/2022      05/13/2022     2:00 PM 07/15/2022    9:15 AM 07/16/2022    5:15 PM 07/16/2022    5:23 PM 07/18/2022    1:04 PM  Fall Risk  Falls in the past year?  0   1  Was there an injury with Fall?  0   0  Fall Risk Category Calculator  0   1  Fall Risk Category  Low   Low  Patient Fall Risk Level High fall risk Low fall risk High fall risk High fall risk High fall risk  Patient at Risk for Falls Due to  No Fall Risks   History of fall(s);Impaired balance/gait;Impaired mobility  Fall risk Follow up  Falls evaluation completed   Falls evaluation completed   Functional Status Survey:    Vitals:   07/15/22 0912  BP: 92/68  Pulse: 70  Resp: 18  Temp: 98.1 F (36.7 C)  SpO2: 92%  Weight: 161 lb (73 kg)  Height: '5\' 9"'$  (1.753 m)   Body mass index is 23.78 kg/m. Physical Exam Vitals and nursing note reviewed.  Constitutional:      Comments: frail  HENT:     Head: Normocephalic and atraumatic.     Mouth/Throat:     Mouth: Mucous membranes are moist.  Eyes:     Extraocular Movements: Extraocular movements intact.     Conjunctiva/sclera: Conjunctivae normal.     Pupils: Pupils are equal, round, and reactive to light.  Cardiovascular:     Rate and Rhythm: Normal rate and regular rhythm.     Heart sounds: No murmur heard. Pulmonary:     Effort: Pulmonary effort is normal.     Breath sounds: No rales.  Abdominal:     General: Bowel sounds are normal.     Palpations: Abdomen is soft.     Tenderness: There is no abdominal tenderness.  Musculoskeletal:     Cervical back: Normal range of motion and neck supple.     Right lower leg: No edema.     Left lower leg: No edema.     Comments: R shoulder pain with ROM is chronic. Chronic R+L knee pain, knee support L knee. S/p R hips replacement. S/p cervical/lumbar spine surgeries.   Skin:    General: Skin is warm and dry.  Neurological:     General: No focal deficit present.  Mental Status: He is alert. Mental status is at baseline.     Gait: Gait  abnormal.     Comments: uses w/c for mobility,  lower body weakness, peripheral neuropathy in legs.   Psychiatric:     Comments: Flat affect     Labs reviewed: Recent Labs    04/21/22 0000 05/11/22 2255 05/12/22 0444  NA 141 139 139  K 4.1 4.0 3.7  CL 105 103 103  CO2 30* 21* 28  GLUCOSE  --  132* 126*  BUN '15 14 14  '$ CREATININE 0.8 0.88 0.88  CALCIUM 8.4* 8.4* 8.7*   Recent Labs    04/21/22 0000 05/11/22 2255 05/12/22 0444  AST '24 26 21  '$ ALT '16 14 16  '$ ALKPHOS 87 85 80  BILITOT  --  1.5* 1.3*  PROT  --  6.3* 6.2*  ALBUMIN 3.1* 2.8* 2.8*   Recent Labs    03/15/22 0000 05/11/22 2255 05/12/22 0444  WBC 6.7 13.3* 10.0  NEUTROABS 5,038.00 11.5* 8.4*  HGB 12.8* 12.8* 12.2*  HCT 41 40.1 38.3*  MCV  --  90.7 89.3  PLT 280 250 263   Lab Results  Component Value Date   TSH 0.98 02/06/2022   No results found for: "HGBA1C" Lab Results  Component Value Date   CHOL 114 05/31/2021   HDL 25 (A) 05/31/2021   LDLCALC 66 05/31/2021   TRIG 149 05/31/2021   CHOLHDL 5.1 (H) 03/31/2019    Significant Diagnostic Results in last 30 days:  No results found.  Assessment/Plan Abnormal abdominal CT scan  Weight loss: CT abd/pelvis with CM, Underlying neoplasm or duodenal ulcer, Had MRI, underwent GI evaluation, under Hospice service. Abd pain, prn Morphine is available to him.   Vascular dementia ([place]) CT head with CM, No evidence of acute intracranial abnormality or mass. Progressive cerebral atrophy and chronic small vessel ischemic disease.  Axonal sensorimotor neuropathy  weakness in legs, w/c for mobility, on Gabapentin. Had nerve conduction study 03/10/18  Anxiety  on Sertraline, Mirtazapine, Seroquel, prn Lorazepam, intermittent irritable, on prn Hydroxyzine since 04/22/22, dc'd Zyprexa 02/25/22.   Osteoarthritis, multiple sites Left knee OA, R shoulder,  X-ray R hip/knee, underwent Ortho, prn Tylenol, Morphine.  Urinary frequency stable, on Tamsulosin.  underwent Urology evaluation in the past.   Constipation Stable, takes Senokot S, MiraLax     Family/ staff Communication: plan of care reviewed with the patient and charge nurse.   Labs/tests ordered: none  Time spend 25 minutes.

## 2022-07-15 NOTE — Assessment & Plan Note (Signed)
Stable, takes Senokot S, MiraLax. 

## 2022-07-15 NOTE — Assessment & Plan Note (Signed)
stable, on Tamsulosin. underwent Urology evaluation in the past.

## 2022-07-15 NOTE — Assessment & Plan Note (Signed)
Left knee OA, R shoulder,  X-ray R hip/knee, underwent Ortho, prn Tylenol, Morphine.

## 2022-07-16 ENCOUNTER — Emergency Department (HOSPITAL_BASED_OUTPATIENT_CLINIC_OR_DEPARTMENT_OTHER)
Admission: EM | Admit: 2022-07-16 | Discharge: 2022-07-16 | Disposition: A | Discharge status: Skilled Nursing Facility | Attending: Emergency Medicine | Admitting: Emergency Medicine

## 2022-07-16 DIAGNOSIS — I10 Essential (primary) hypertension: Secondary | ICD-10-CM | POA: Insufficient documentation

## 2022-07-16 DIAGNOSIS — X58XXXA Exposure to other specified factors, initial encounter: Secondary | ICD-10-CM | POA: Diagnosis not present

## 2022-07-16 DIAGNOSIS — Z79899 Other long term (current) drug therapy: Secondary | ICD-10-CM | POA: Insufficient documentation

## 2022-07-16 DIAGNOSIS — Y92129 Unspecified place in nursing home as the place of occurrence of the external cause: Secondary | ICD-10-CM | POA: Insufficient documentation

## 2022-07-16 DIAGNOSIS — F0390 Unspecified dementia without behavioral disturbance: Secondary | ICD-10-CM | POA: Diagnosis not present

## 2022-07-16 DIAGNOSIS — S61411A Laceration without foreign body of right hand, initial encounter: Secondary | ICD-10-CM | POA: Diagnosis present

## 2022-07-16 MED ORDER — LIDOCAINE HCL (PF) 1 % IJ SOLN
10.0000 mL | Freq: Once | INTRAMUSCULAR | Status: AC
  Administered 2022-07-16: 10 mL
  Filled 2022-07-16: qty 10

## 2022-07-16 MED ORDER — TETANUS-DIPHTH-ACELL PERTUSSIS 5-2.5-18.5 LF-MCG/0.5 IM SUSY
0.5000 mL | PREFILLED_SYRINGE | Freq: Once | INTRAMUSCULAR | Status: DC
  Filled 2022-07-16: qty 0.5

## 2022-07-16 NOTE — ED Notes (Signed)
Patients wife [name] will like to be called when patient is on the way back to his facility.

## 2022-07-16 NOTE — ED Notes (Signed)
Called [facility] at 841pm, patient is 3rd on list.

## 2022-07-16 NOTE — Discharge Instructions (Signed)
Dermabond was placed over the laceration to keep the wound edges together.  The splint was placed on your hand and wrist to help prevent movement of your thumb so the wound will heal.  Keep the dressing over the wound and change it daily.  No need for antibiotic ointment as the Dermabond will provide a bacterial barrier.

## 2022-07-16 NOTE — ED Triage Notes (Signed)
Pt arrived via [facility]. Per EMS, pt is from [place] at [facility] who called for pt to be transported for laceration between the right thumb and right pointer finger. Unknown how lac occurred however pt is bed bound and had no obvious fall or trauma per SNF. EMS advised pt's family on scene stated pt is at baseline mental status alert to person only with dementia. No other obvious injury or complaint.   EMS VS BP 118/70  HR 88 RR 16  SpO2 95% RA CBG 121

## 2022-07-16 NOTE — ED Provider Notes (Signed)
[facility] EMERGENCY DEPT Provider Note   CSN: [id number] Arrival date & time: 07/16/22  1658     History  Chief Complaint  Patient presents with   Laceration    [name] is a 88 y.o. male.   Laceration    Patient has a history of arthritis, hypercholesterolemia, vertigo, hypertension, spinal stenosis, anemia.  Patient also has history of dementia.  He presents to the ED for evaluation after a head injury.  Patient is at a nursing facility.  They noticed he sustained a laceration in the webspace between his index finger and thumb.  Bleeding was controlled with pressure.  Home Medications Prior to Admission medications   Medication Sig Start Date End Date Taking? Authorizing Provider  acetaminophen (TYLENOL) 500 MG tablet Take 1,000 mg by mouth daily as needed for mild pain.    [provider]  fexofenadine (ALLEGRA) 180 MG tablet Take 180 mg by mouth daily.    [provider]  fluticasone (FLONASE) 50 MCG/ACT nasal spray Place 2 sprays into both nostrils daily.    [provider]  gabapentin (NEURONTIN) 100 MG capsule Take 200 mg by mouth at bedtime. 8pm.    [provider]  Hydroactive Dressings (DYNADERM HYDROCOLLOID 4"X4" EX) Apply topically every 3 (three) days. Cleanse dime-sized area on right buttocks with NSS, pat dry, apply 2X2 dressing, change every 3 days.    [provider]  hydrocortisone 2.5 % cream Apply 1 Application topically 2 (two) times daily as needed (for facial rash).    [provider]  hydrOXYzine (ATARAX) 10 MG tablet Take 10 mg by mouth 2 (two) times daily as needed (for agitation).    [provider]  meclizine (ANTIVERT) 25 MG tablet Take 25 mg by mouth 3 (three) times daily as needed for dizziness.    [provider]  Menthol, Topical Analgesic, (BIOFREEZE) 4 % GEL Apply 1 Application topically in the morning, at noon, in the evening, and at bedtime. Apply to  right shoulder    [provider]  mirtazapine (REMERON) 7.5 MG tablet Take 7.5 mg by mouth at bedtime. 04/05/22   [provider]  morphine 10 MG/5ML solution Take 1.3 mLs (2.6 mg total) by mouth every 2 (two) hours as needed for severe pain. 05/15/22   Mast, Man X, NP  NAT-RUL PSYLLIUM SEED HUSKS PO Take 1 tablet by mouth daily. For constipation    [provider]  Nutritional Supplements (RESOURCE 2.0) LIQD Take 180 mLs by mouth in the morning and at bedtime.    [provider]  nystatin (MYCOSTATIN/NYSTOP) powder Apply 1 application  topically See admin instructions. Apply to periwound and groin every other day as needed    [provider]  polyethylene glycol (MIRALAX / GLYCOLAX) 17 g packet Take 17 g by mouth daily.    [provider]  promethazine (PHENERGAN) 25 MG suppository Place 25 mg rectally every 6 (six) hours as needed for nausea or vomiting.    [provider]  QUEtiapine (SEROQUEL) 25 MG tablet Take 50 mg by mouth 2 (two) times daily.    [provider]  sennosides-docusate sodium (SENOKOT-S) 8.6-50 MG tablet Take 2 tablets by mouth daily.    [provider]  sertraline (ZOLOFT) 25 MG tablet Take 1 tablet (25 mg total) by mouth daily. 03/10/22   Medina-Vargas, Monina C, NP  tamsulosin (FLOMAX) 0.4 MG CAPS capsule Take 0.4 mg by mouth at bedtime.    [provider]  Allergies    Tramadol    Review of Systems   Review of Systems  Physical Exam Updated Vital Signs BP 119/82   Pulse 89   Temp 97.7 F (36.5 C) (Axillary)   Resp (!) 23   SpO2 98%  Physical Exam Vitals and nursing note reviewed.  Constitutional:      General: He is not in acute distress.    Appearance: He is well-developed.  HENT:     Head: Normocephalic and atraumatic.     Right Ear: External ear normal.     Left Ear: External ear normal.  Eyes:     General: No scleral icterus.       Right eye: No discharge.         Left eye: No discharge.     Conjunctiva/sclera: Conjunctivae normal.  Neck:     Trachea: No tracheal deviation.  Cardiovascular:     Rate and Rhythm: Normal rate.  Pulmonary:     Effort: Pulmonary effort is normal. No respiratory distress.     Breath sounds: No stridor.  Abdominal:     General: There is no distension.  Musculoskeletal:        General: No swelling or deformity.     Cervical back: Neck supple.     Comments: Laceration noted between the webspace of the index finger and thumb of the right hand  Skin:    General: Skin is warm and dry.     Findings: No rash.  Neurological:     Mental Status: He is alert.     Cranial Nerves: Cranial nerve deficit: no gross deficits.     ED Results / Procedures / Treatments   Labs (all labs ordered are listed, but only abnormal results are displayed) Labs Reviewed - No data to display  EKG None  Radiology No results found.  Procedures .Marland KitchenLaceration Repair  Date/Time: 07/16/2022 8:32 PM  Performed by: Dorie Rank, MD Authorized by: Dorie Rank, MD   Consent:    Consent obtained:  Emergent situation Anesthesia:    Anesthesia method:  None Laceration details:    Length (cm):  2 Pre-procedure details:    Preparation:  Patient was prepped and draped in usual sterile fashion Exploration:    Limited defect created (wound extended): no     Wound extent: fascia not violated, no foreign body, no signs of injury, no nerve damage, no tendon damage, no underlying fracture and no vascular damage   Treatment:    Area cleansed with:  Saline   Amount of cleaning:  Standard Skin repair:    Repair method:  Tissue adhesive Approximation:    Approximation:  Close Repair type:    Repair type:  Simple     Medications Ordered in ED Medications  Tdap (BOOSTRIX) injection 0.5 mL (0.5 mLs Intramuscular Not Given 07/16/22 1930)  lidocaine (PF) (XYLOCAINE) 1 % injection 10 mL (10 mLs Other Given 07/16/22 1722)    ED Course/ Medical  Decision Making/ A&P                           Medical Decision Making Problems Addressed: Laceration of right hand without foreign body, initial encounter: acute illness or injury  Risk Prescription drug management.   Patient with dementia.  He started to refuse any treatment.  Initially planned on suture wound closure but pt uncooperative.  Tolerated dermabond wound closure .  Splint placed to help pt not pick at the wound  Final Clinical Impression(s) / ED Diagnoses Final diagnoses:  Laceration of right hand without foreign body, initial encounter    Rx / DC Orders ED Discharge Orders     None         Dorie Rank, MD 07/16/22 2034

## 2022-07-18 ENCOUNTER — Encounter: Payer: Self-pay | Admitting: Nurse Practitioner

## 2022-07-18 ENCOUNTER — Non-Acute Institutional Stay (SKILLED_NURSING_FACILITY): Payer: Medicare PPO | Admitting: Nurse Practitioner

## 2022-07-18 DIAGNOSIS — F01C11 Vascular dementia, severe, with agitation: Secondary | ICD-10-CM

## 2022-07-18 DIAGNOSIS — Z87828 Personal history of other (healed) physical injury and trauma: Secondary | ICD-10-CM

## 2022-07-18 DIAGNOSIS — M159 Polyosteoarthritis, unspecified: Secondary | ICD-10-CM | POA: Diagnosis not present

## 2022-07-18 DIAGNOSIS — G6289 Other specified polyneuropathies: Secondary | ICD-10-CM | POA: Diagnosis not present

## 2022-07-18 DIAGNOSIS — K5900 Constipation, unspecified: Secondary | ICD-10-CM

## 2022-07-18 DIAGNOSIS — F419 Anxiety disorder, unspecified: Secondary | ICD-10-CM | POA: Diagnosis not present

## 2022-07-18 DIAGNOSIS — R350 Frequency of micturition: Secondary | ICD-10-CM

## 2022-07-18 DIAGNOSIS — J309 Allergic rhinitis, unspecified: Secondary | ICD-10-CM

## 2022-07-18 DIAGNOSIS — R935 Abnormal findings on diagnostic imaging of other abdominal regions, including retroperitoneum: Secondary | ICD-10-CM

## 2022-07-18 NOTE — Progress Notes (Unsigned)
Location:  [place] Room Number: NO/16/A Place of Service:  SNF (31) Provider:  Meghna Hagmann X, NP  Patient Care Team: Zackary Mckeone X, NP as PCP - General (Internal Medicine) [name], [name], MD as Consulting Physician (Orthopedic Surgery) [name], MD as Consulting Physician (Gastroenterology) System, Provider Not In [name], MD as Consulting Physician (Neurosurgery) [name], MD as Referring Physician (Ophthalmology) Chavela Justiniano X, NP as Nurse Practitioner (Internal Medicine) Almedia Balls, MD as Referring Physician (Orthopedic Surgery) [name], MD as Consulting Physician (Neurosurgery) Elsie Saas, MD as Consulting Physician (Orthopedic Surgery) Allyn Kenner, MD (Inactive) (Dermatology) Kathrynn Ducking, MD (Inactive) as Consulting Physician (Neurology) Franchot Gallo, MD as Consulting Physician (Urology)  Extended Emergency Contact Information Primary Emergency Contact: [name] Address: [address]          [place], [place] [address] [place] Phone: [phone] Relation: Spouse Secondary Emergency Contact: [place] Phone: [phone] Relation: Relative  Code Status:  DNR Goals of care: Advanced Directive information    07/18/2022    1:04 PM  Advanced Directives  Does Patient Have a Medical Advance Directive? Yes  Type of Paramedic of [place];Living will;Out of facility DNR (pink MOST or yellow form)  Does patient want to make changes to medical advance directive? No - Patient declined  Copy of [place] in Chart? Yes - validated most recent copy scanned in chart (See row information)  Pre-existing out of facility DNR order (yellow form or pink MOST form) Yellow form placed in chart (order not valid for inpatient use)     Chief Complaint  Patient presents with   Follow-up    Patient is here for follow  up after hospital stay     HPI:  Pt is a 88 y.o. male seen today for an acute visit for f/u ED evaluation 07/16/22 for laceration of right hand in the webspace between thumb and index finger, tissue adhesive closure is intact, no active bleeding or s/s of infection.   Weight loss: CT abd/pelvis with CM, Underlying neoplasm or duodenal ulcer, Had MRI, underwent GI evaluation, under Hospice service. Abd pain, prn Morphine is available to him.              Dementia/Disorientation: CT head with CM, No evidence of acute intracranial abnormality or mass. Progressive cerebral atrophy and chronic small vessel ischemic disease. Peripheral neuropathy, weakness in legs, w/c for mobility, on Gabapentin. Had nerve conduction study 03/10/18             Anxiety/depression, on Sertraline, Mirtazapine, Seroquel, prn Lorazepam, intermittent irritable, on prn Hydroxyzine since 04/22/22, dc'd Zyprexa 02/25/22.              Left knee OA, R shoulder,  X-ray R hip/knee, underwent Ortho, prn Tylenol, Morphine.              Urinary frequency, stable, on Tamsulosin. underwent Urology evaluation in the past.              Constipation, takes Senokot S, MiraLax             Allergic rhinitis, takes Fluticasone nasal spray, Fexofenadine.     Past Medical History:  Diagnosis Date   Abnormal MRI, knee    Per [facility] New Patient Packet    Anemia    Anticoagulated    Anxiety    Arthritis  Cervical disc disorder    Per [facility] New Patient Packet    Complication of anesthesia    states "[place] was [place] weeks after surgery"caused by tramadol   Constipation    Dysuria 07/28/2016   Edema 07/24/2016   Gait abnormality 07/25/2016   Hard of hearing    Head injury    As a result of a fall, Per [facility] New Patient Packet    Hip arthritis 07/21/2016   History of fall 07/25/2016   Hypercholesteremia    Hypertension    Lumbar spinal stenosis    Memory loss 07/28/2016   mild   Obesity    Per [facility] New Patient Packet     Osteoarthritis of right hip 07/24/2016   Peripheral neuropathy 03/10/2018   Sleep apnea    cpap   Spinal stenosis of lumbar region 05/28/2016   Urinary frequency    Venous thrombosis of leg 07/24/2016   right gastrocnemius   Vertigo    Wears glasses    Weight loss 04/08/2016   Past Surgical History:  Procedure Laterality Date   BACK SURGERY  10/17, 80's   COLONOSCOPY     COLONOSCOPY     Per [facility] New Patient Packet, Dr.Schooler    ESOPHAGOGASTRODUODENOSCOPY     LUMBAR LAMINECTOMY/DECOMPRESSION MICRODISCECTOMY Right 05/28/2016   Procedure: Right Lumbar Three-Four Microdiscectomy;  Surgeon: [name], MD;  Location: [place] NEURO ORS;  Service: Neurosurgery;  Laterality: Right;   TOTAL HIP ARTHROPLASTY Right 07/21/2016   Procedure: TOTAL HIP ARTHROPLASTY ANTERIOR APPROACH;  Surgeon: [name], MD;  Location: [place];  Service: Orthopedics;  Laterality: Right;    Allergies  Allergen Reactions   Tramadol Other (See Comments)    "does not eat" LOSS OF APPETITE    Outpatient Encounter Medications as of 07/18/2022  Medication Sig   acetaminophen (TYLENOL) 500 MG tablet Take 1,000 mg by mouth daily as needed for mild pain.   fexofenadine (ALLEGRA) 180 MG tablet Take 180 mg by mouth daily.   fluticasone (FLONASE) 50 MCG/ACT nasal spray Place 2 sprays into both nostrils daily.   gabapentin (NEURONTIN) 100 MG capsule Take 200 mg by mouth at bedtime. 8pm.   Hydroactive Dressings (DYNADERM HYDROCOLLOID 4"X4" EX) Apply topically every 3 (three) days. Cleanse dime-sized area on right buttocks with NSS, pat dry, apply 2X2 dressing, change every 3 days.   hydrocortisone 2.5 % cream Apply 1 Application topically 2 (two) times daily as needed (for facial rash).   hydrOXYzine (ATARAX) 10 MG tablet Take 10 mg by mouth 2 (two) times daily as needed (for agitation).   meclizine (ANTIVERT) 25 MG tablet Take 25 mg by mouth 3 (three) times daily as needed for dizziness.   Menthol, Topical Analgesic,  (BIOFREEZE) 4 % GEL Apply 1 Application topically in the morning, at noon, in the evening, and at bedtime. Apply to right shoulder   mirtazapine (REMERON) 7.5 MG tablet Take 7.5 mg by mouth at bedtime.   morphine 10 MG/5ML solution Take 1.3 mLs (2.6 mg total) by mouth every 2 (two) hours as needed for severe pain.   NAT-RUL PSYLLIUM SEED HUSKS PO Take 1 tablet by mouth daily. For constipation   Nutritional Supplements (RESOURCE 2.0) LIQD Take 180 mLs by mouth in the morning and at bedtime.   nystatin (MYCOSTATIN/NYSTOP) powder Apply 1 application  topically See admin instructions. Apply to periwound and groin every other day as needed   polyethylene glycol (MIRALAX / GLYCOLAX) 17 g packet Take 17 g  by mouth daily.   promethazine (PHENERGAN) 25 MG suppository Place 25 mg rectally every 6 (six) hours as needed for nausea or vomiting.   QUEtiapine (SEROQUEL) 25 MG tablet Take 50 mg by mouth 2 (two) times daily.   sennosides-docusate sodium (SENOKOT-S) 8.6-50 MG tablet Take 2 tablets by mouth daily.   sertraline (ZOLOFT) 25 MG tablet Take 1 tablet (25 mg total) by mouth daily.   tamsulosin (FLOMAX) 0.4 MG CAPS capsule Take 0.4 mg by mouth at bedtime.   No facility-administered encounter medications on file as of 07/18/2022.    Review of Systems  Unable to perform ROS: Dementia    Immunization History  Administered Date(s) Administered   Influenza-Unspecified 06/11/2015, 06/11/2019, 06/19/2020, 06/27/2021   Moderna Sars-Covid-2 Vaccination 09/10/2019, 10/08/2019, 07/17/2020, 02/05/2021   PFIZER(Purple Top)SARS-COV-2 Vaccination 09/10/2019   Pneumococcal Conjugate-13 09/12/2014   Pneumococcal Polysaccharide-23 04/21/2011   Pneumococcal-Unspecified 04/21/2019   Td 04/22/2002   Tetanus 05/16/2020   Unspecified SARS-COV-2 Vaccination 05/28/2021   Zoster Recombinat (Shingrix) 09/14/2021, 11/25/2021   Zoster, Live 04/27/2012   Pertinent  Health Maintenance Due  Topic Date Due   INFLUENZA  VACCINE  04/08/2022      05/13/2022    2:00 PM 07/15/2022    9:15 AM 07/16/2022    5:15 PM 07/16/2022    5:23 PM 07/18/2022    1:04 PM  Fall Risk  Falls in the past year?  0   1  Was there an injury with Fall?  0   0  Fall Risk Category Calculator  0   1  Fall Risk Category  Low   Low  Patient Fall Risk Level High fall risk Low fall risk High fall risk High fall risk High fall risk  Patient at Risk for Falls Due to  No Fall Risks   History of fall(s);Impaired balance/gait;Impaired mobility  Fall risk Follow up  Falls evaluation completed   Falls evaluation completed   Functional Status Survey:    Vitals:   07/18/22 1302  BP: 92/68  Pulse: 70  Resp: 18  Temp: 98.1 F (36.7 C)  SpO2: 92%  Weight: 161 lb (73 kg)  Height: '5\' 9"'$  (1.753 m)   Body mass index is 23.78 kg/m. Physical Exam Vitals and nursing note reviewed.  Constitutional:      Comments: frail  HENT:     Head: Normocephalic and atraumatic.     Mouth/Throat:     Mouth: Mucous membranes are moist.  Eyes:     Extraocular Movements: Extraocular movements intact.     Conjunctiva/sclera: Conjunctivae normal.     Pupils: Pupils are equal, round, and reactive to light.  Cardiovascular:     Rate and Rhythm: Normal rate and regular rhythm.     Heart sounds: No murmur heard. Pulmonary:     Effort: Pulmonary effort is normal.     Breath sounds: No rales.  Abdominal:     General: Bowel sounds are normal.     Palpations: Abdomen is soft.     Tenderness: There is no abdominal tenderness.  Musculoskeletal:     Cervical back: Normal range of motion and neck supple.     Right lower leg: No edema.     Left lower leg: No edema.     Comments: R shoulder pain with ROM is chronic. Chronic R+L knee pain, knee support L knee. S/p R hips replacement. S/p cervical/lumbar spine surgeries.   Skin:    General: Skin is warm and dry.     Findings:  Bruising present.     Comments: 07/16/22 for laceration of right hand in the webspace  between thumb and index finger, tissue adhesive closure is intact, no active bleeding or s/s of infection.   Neurological:     General: No focal deficit present.     Mental Status: He is alert. Mental status is at baseline.     Gait: Gait abnormal.     Comments: uses w/c for mobility,  lower body weakness, peripheral neuropathy in legs.   Psychiatric:     Comments: Flat affect     Labs reviewed: Recent Labs    04/21/22 0000 05/11/22 2255 05/12/22 0444  NA 141 139 139  K 4.1 4.0 3.7  CL 105 103 103  CO2 30* 21* 28  GLUCOSE  --  132* 126*  BUN '15 14 14  '$ CREATININE 0.8 0.88 0.88  CALCIUM 8.4* 8.4* 8.7*   Recent Labs    04/21/22 0000 05/11/22 2255 05/12/22 0444  AST '24 26 21  '$ ALT '16 14 16  '$ ALKPHOS 87 85 80  BILITOT  --  1.5* 1.3*  PROT  --  6.3* 6.2*  ALBUMIN 3.1* 2.8* 2.8*   Recent Labs    03/15/22 0000 05/11/22 2255 05/12/22 0444  WBC 6.7 13.3* 10.0  NEUTROABS 5,038.00 11.5* 8.4*  HGB 12.8* 12.8* 12.2*  HCT 41 40.1 38.3*  MCV  --  90.7 89.3  PLT 280 250 263   Lab Results  Component Value Date   TSH 0.98 02/06/2022   No results found for: "HGBA1C" Lab Results  Component Value Date   CHOL 114 05/31/2021   HDL 25 (A) 05/31/2021   LDLCALC 66 05/31/2021   TRIG 149 05/31/2021   CHOLHDL 5.1 (H) 03/31/2019    Significant Diagnostic Results in last 30 days:  No results found.  Assessment/Plan Vascular dementia ([place]) Dementia/Disorientation: CT head with CM, No evidence of acute intracranial abnormality or mass. Progressive cerebral atrophy and chronic small vessel ischemic disease.  Axonal sensorimotor neuropathy Peripheral neuropathy, weakness in legs, w/c for mobility, on Gabapentin. Had nerve conduction study 03/10/18  Anxiety on Sertraline, Mirtazapine, Seroquel, prn Lorazepam, intermittent irritable, on prn Hydroxyzine since 04/22/22, dc'd Zyprexa 02/25/22.   Osteoarthritis, multiple sites  Left knee OA, R shoulder,  X-ray R hip/knee, underwent  Ortho, prn Tylenol, Morphine.   Urinary frequency stable, on Tamsulosin. underwent Urology evaluation in the past.  Constipation Stable, takes Senokot S, MiraLax  Allergic rhinitis  takes Fluticasone nasal spray, Fexofenadine.   Abnormal abdominal CT scan Weight loss: CT abd/pelvis with CM, Underlying neoplasm or duodenal ulcer, Had MRI, underwent GI evaluation, under Hospice service. Abd pain, prn Morphine is available to him.   H/O laceration of skin  f/u ED evaluation 07/16/22 for laceration of right hand in the webspace between thumb and index finger, tissue adhesive closure is intact, no active bleeding or s/s of infection.      Family/ staff Communication: plan of care reviewed with the patient and charge nurse.   Labs/tests ordered:  none  Time spend 35 minutes.

## 2022-07-21 ENCOUNTER — Encounter: Payer: Self-pay | Admitting: Nurse Practitioner

## 2022-07-21 DIAGNOSIS — Z87828 Personal history of other (healed) physical injury and trauma: Secondary | ICD-10-CM | POA: Insufficient documentation

## 2022-07-21 NOTE — Assessment & Plan Note (Signed)
takes Fluticasone nasal spray, Fexofenadine.

## 2022-07-21 NOTE — Assessment & Plan Note (Signed)
Stable, takes Senokot S, MiraLax. 

## 2022-07-21 NOTE — Assessment & Plan Note (Signed)
Left knee OA, R shoulder,  X-ray R hip/knee, underwent Ortho, prn Tylenol, Morphine.

## 2022-07-21 NOTE — Assessment & Plan Note (Signed)
Peripheral neuropathy, weakness in legs, w/c for mobility, on Gabapentin. Had nerve conduction study 03/10/18

## 2022-07-21 NOTE — Assessment & Plan Note (Signed)
Weight loss: CT abd/pelvis with CM, Underlying neoplasm or duodenal ulcer, Had MRI, underwent GI evaluation, under Hospice service. Abd pain, prn Morphine is available to him.

## 2022-07-21 NOTE — Assessment & Plan Note (Signed)
f/u ED evaluation 07/16/22 for laceration of right hand in the webspace between thumb and index finger, tissue adhesive closure is intact, no active bleeding or s/s of infection.

## 2022-07-21 NOTE — Assessment & Plan Note (Signed)
on Sertraline, Mirtazapine, Seroquel, prn Lorazepam, intermittent irritable, on prn Hydroxyzine since 04/22/22, dc'd Zyprexa 02/25/22.

## 2022-07-21 NOTE — Assessment & Plan Note (Signed)
stable, on Tamsulosin. underwent Urology evaluation in the past.

## 2022-07-21 NOTE — Assessment & Plan Note (Signed)
Dementia/Disorientation: CT head with CM, No evidence of acute intracranial abnormality or mass. Progressive cerebral atrophy and chronic small vessel ischemic disease.

## 2022-09-08 DEATH — deceased
